# Patient Record
Sex: Male | Born: 1935 | Race: White | Hispanic: No | Marital: Married | Smoking: Former smoker
Health system: Southern US, Community
[De-identification: ages and names within clinical notes are randomized; demographics above are authoritative.]

## PROBLEM LIST (undated history)

## (undated) DIAGNOSIS — R471 Dysarthria and anarthria: Secondary | ICD-10-CM

## (undated) DIAGNOSIS — Z860100 Personal history of colon polyps, unspecified: Secondary | ICD-10-CM

## (undated) DIAGNOSIS — N400 Enlarged prostate without lower urinary tract symptoms: Secondary | ICD-10-CM

## (undated) DIAGNOSIS — I420 Dilated cardiomyopathy: Secondary | ICD-10-CM

## (undated) DIAGNOSIS — F32A Depression, unspecified: Secondary | ICD-10-CM

## (undated) DIAGNOSIS — I255 Ischemic cardiomyopathy: Secondary | ICD-10-CM

## (undated) DIAGNOSIS — E46 Unspecified protein-calorie malnutrition: Secondary | ICD-10-CM

## (undated) DIAGNOSIS — J189 Pneumonia, unspecified organism: Secondary | ICD-10-CM

## (undated) DIAGNOSIS — K649 Unspecified hemorrhoids: Secondary | ICD-10-CM

## (undated) DIAGNOSIS — I2510 Atherosclerotic heart disease of native coronary artery without angina pectoris: Secondary | ICD-10-CM

## (undated) DIAGNOSIS — M869 Osteomyelitis, unspecified: Secondary | ICD-10-CM

## (undated) DIAGNOSIS — F419 Anxiety disorder, unspecified: Secondary | ICD-10-CM

## (undated) DIAGNOSIS — M1990 Unspecified osteoarthritis, unspecified site: Secondary | ICD-10-CM

## (undated) DIAGNOSIS — Z8719 Personal history of other diseases of the digestive system: Secondary | ICD-10-CM

## (undated) DIAGNOSIS — R413 Other amnesia: Secondary | ICD-10-CM

## (undated) DIAGNOSIS — Z87442 Personal history of urinary calculi: Secondary | ICD-10-CM

## (undated) DIAGNOSIS — K219 Gastro-esophageal reflux disease without esophagitis: Secondary | ICD-10-CM

## (undated) DIAGNOSIS — K5900 Constipation, unspecified: Secondary | ICD-10-CM

## (undated) DIAGNOSIS — G4700 Insomnia, unspecified: Secondary | ICD-10-CM

## (undated) DIAGNOSIS — F329 Major depressive disorder, single episode, unspecified: Secondary | ICD-10-CM

## (undated) DIAGNOSIS — R350 Frequency of micturition: Secondary | ICD-10-CM

## (undated) DIAGNOSIS — E785 Hyperlipidemia, unspecified: Secondary | ICD-10-CM

## (undated) HISTORY — PX: HEMORRHOIDECTOMY WITH HEMORRHOID BANDING: SHX5633

## (undated) HISTORY — PX: OTHER SURGICAL HISTORY: SHX169

## (undated) HISTORY — PX: HERNIA REPAIR: SHX51

## (undated) HISTORY — DX: Dilated cardiomyopathy: I25.5

## (undated) HISTORY — PX: TONSILLECTOMY: SUR1361

## (undated) HISTORY — DX: Unspecified osteoarthritis, unspecified site: M19.90

## (undated) HISTORY — PX: CIRCUMCISION: SUR203

## (undated) HISTORY — PX: COLONOSCOPY: SHX174

## (undated) HISTORY — PX: ESOPHAGOGASTRODUODENOSCOPY: SHX1529

## (undated) HISTORY — PX: CARDIAC SURGERY: SHX584

## (undated) HISTORY — PX: PERIPHERALLY INSERTED CENTRAL CATHETER INSERTION: SHX2221

## (undated) HISTORY — DX: Dilated cardiomyopathy: I42.0

## (undated) HISTORY — DX: Other amnesia: R41.3

---

## 2000-07-13 ENCOUNTER — Ambulatory Visit (HOSPITAL_COMMUNITY): Admission: RE | Admit: 2000-07-13 | Discharge: 2000-07-13 | Payer: Self-pay | Admitting: Gastroenterology

## 2000-07-13 ENCOUNTER — Encounter: Payer: Self-pay | Admitting: Gastroenterology

## 2001-09-24 ENCOUNTER — Ambulatory Visit (HOSPITAL_COMMUNITY): Admission: RE | Admit: 2001-09-24 | Discharge: 2001-09-24 | Payer: Self-pay | Admitting: Gastroenterology

## 2012-05-02 ENCOUNTER — Ambulatory Visit (INDEPENDENT_AMBULATORY_CARE_PROVIDER_SITE_OTHER): Payer: Medicare Other | Admitting: Emergency Medicine

## 2012-05-02 VITALS — BP 141/62 | HR 59 | Temp 97.6°F | Resp 16 | Ht 65.0 in | Wt 155.0 lb

## 2012-05-02 DIAGNOSIS — M5020 Other cervical disc displacement, unspecified cervical region: Secondary | ICD-10-CM

## 2012-05-02 DIAGNOSIS — M542 Cervicalgia: Secondary | ICD-10-CM

## 2012-05-02 MED ORDER — PREDNISONE 20 MG PO TABS: ORAL_TABLET | ORAL | Status: DC

## 2012-05-02 MED ORDER — HYDROCODONE-ACETAMINOPHEN 5-325 MG PO TABS: 1.0000 | ORAL_TABLET | Freq: Four times a day (QID) | ORAL | Status: DC | PRN

## 2012-05-02 NOTE — Progress Notes (Signed)
  Subjective:    Patient ID: Jason [name], male    DOB: 1929/08/16, 78 y.o.   MRN: [id number]  HPI patient has a history of severe degenerative disc disease in the cervical spine. He's been treated with prednisone and hydrocodone in the past with good results. He enters today with a non-peeling sensation which extends from the right side of his neck to the right shoulder. He does not have any right arm muscle weakness.    Review of Systems     Objective:   Physical Exam there is very limited flexion and extension of the neck. Deep tendon reflexes are hard to elicit he has 1+ triceps but I could not get biceps or brachioradialis reflexes on either side. Motor strength is 5 out of 5 all muscle groups        Assessment & Plan:  Assessment cervical radiculopathy right arm. We'll treat with prednisone in a taper dose along with hydrocodone for pain. Patient was advised he will need an MRI of the C-spine if he continues to have problems

## 2013-01-24 ENCOUNTER — Ambulatory Visit: Payer: Medicare Other

## 2013-01-31 ENCOUNTER — Ambulatory Visit (INDEPENDENT_AMBULATORY_CARE_PROVIDER_SITE_OTHER): Payer: Medicare Other | Admitting: Internal Medicine

## 2013-01-31 VITALS — BP 122/69 | HR 60 | Temp 97.3°F | Resp 16 | Ht 64.75 in | Wt 146.8 lb

## 2013-01-31 DIAGNOSIS — R5383 Other fatigue: Secondary | ICD-10-CM

## 2013-01-31 DIAGNOSIS — R5381 Other malaise: Secondary | ICD-10-CM

## 2013-01-31 DIAGNOSIS — N323 Diverticulum of bladder: Secondary | ICD-10-CM

## 2013-01-31 DIAGNOSIS — K625 Hemorrhage of anus and rectum: Secondary | ICD-10-CM

## 2013-01-31 DIAGNOSIS — R001 Bradycardia, unspecified: Secondary | ICD-10-CM

## 2013-01-31 DIAGNOSIS — R42 Dizziness and giddiness: Secondary | ICD-10-CM

## 2013-01-31 DIAGNOSIS — I498 Other specified cardiac arrhythmias: Secondary | ICD-10-CM

## 2013-01-31 DIAGNOSIS — R110 Nausea: Secondary | ICD-10-CM

## 2013-01-31 DIAGNOSIS — R195 Other fecal abnormalities: Secondary | ICD-10-CM

## 2013-01-31 LAB — POCT CBC
Granulocyte percent: 70.9 %G (ref 37–80)
HCT, POC: 47.5 % (ref 43.5–53.7)
Hemoglobin: 15.5 g/dL (ref 14.1–18.1)
Lymph, poc: 2 (ref 0.6–3.4)
MCH, POC: 30.9 pg (ref 27–31.2)
MCHC: 32.6 g/dL (ref 31.8–35.4)
MCV: 94.7 fL (ref 80–97)
MID (cbc): 0.6 (ref 0–0.9)
MPV: 9.3 fL (ref 0–99.8)
POC Granulocyte: 6.3 (ref 2–6.9)
POC LYMPH PERCENT: 22.3 %L (ref 10–50)
POC MID %: 6.8 %M (ref 0–12)
Platelet Count, POC: 182 10*3/uL (ref 142–424)
RBC: 5.02 M/uL (ref 4.69–6.13)
RDW, POC: 14.3 %
WBC: 8.9 10*3/uL (ref 4.6–10.2)

## 2013-01-31 LAB — POCT SEDIMENTATION RATE: POCT SED RATE: 5 mm/hr (ref 0–22)

## 2013-01-31 LAB — IFOBT (OCCULT BLOOD): IFOBT: POSITIVE

## 2013-01-31 NOTE — Patient Instructions (Addendum)
Bradycardia Bradycardia is a term for a heart rate (pulse) that, in adults, is slower than 60 beats per minute. A normal rate is 60 to 100 beats per minute. A heart rate below 60 beats per minute may be normal for some adults with healthy hearts. If the rate is too slow, the heart may have trouble pumping the volume of blood the body needs. If the heart rate gets too low, blood flow to the brain may be decreased and may make you feel lightheaded, dizzy, or faint. The heart has a natural pacemaker in the top of the heart called the SA node (sinoatrial or sinus node). This pacemaker sends out regular electrical signals to the muscle of the heart, telling the heart muscle when to beat (contract). The electrical signal travels from the upper parts of the heart (atria) through the AV node (atrioventricular node), to the lower chambers of the heart (ventricles). The ventricles squeeze, pumping the blood from your heart to your lungs and to the rest of your body. CAUSES   Problem with the heart's electrical system.  Problem with the heart's natural pacemaker.  Heart disease, damage, or infection.  Medications.  Problems with minerals and salts (electrolytes). SYMPTOMS   Fainting (syncope).  Fatigue and weakness.  Shortness of breath (dyspnea).  Chest pain (angina).  Drowsiness.  Confusion. DIAGNOSIS   An electrocardiogram (ECG) can help your caregiver determine the type of slow heart rate you have.  If the cause is not seen on an ECG, you may need to wear a heart monitor that records your heart rhythm for several hours or days.  Blood tests. TREATMENT   Electrolyte supplements.  Medications.  Withholding medication which is causing a slow heart rate.  Pacemaker placement. SEEK IMMEDIATE MEDICAL CARE IF:   You feel lightheaded or faint.  You develop an irregular heart rate.  You feel chest pain or have trouble breathing. MAKE SURE YOU:   Understand these  instructions.  Will watch your condition.  Will get help right away if you are not doing well or get worse. Document Released: 03/05/2002 Document Revised: 09/05/2011 Document Reviewed: 01/30/2008 [facility] Patient Information 2014 [place], [place]. Vertigo Vertigo means you feel like you or your surroundings are moving when they are not. Vertigo can be dangerous if it occurs when you are at work, driving, or performing difficult activities.  CAUSES  Vertigo occurs when there is a conflict of signals sent to your brain from the visual and sensory systems in your body. There are many different causes of vertigo, including:  Infections, especially in the inner ear.  A bad reaction to a drug or misuse of alcohol and medicines.  Withdrawal from drugs or alcohol.  Rapidly changing positions, such as lying down or rolling over in bed.  A migraine headache.  Decreased blood flow to the brain.  Increased pressure in the brain from a head injury, infection, tumor, or bleeding. SYMPTOMS  You may feel as though the world is spinning around or you are falling to the ground. Because your balance is upset, vertigo can cause nausea and vomiting. You may have involuntary eye movements (nystagmus). DIAGNOSIS  Vertigo is usually diagnosed by physical exam. If the cause of your vertigo is unknown, your caregiver may perform imaging tests, such as an MRI scan (magnetic resonance imaging). TREATMENT  Most cases of vertigo resolve on their own, without treatment. Depending on the cause, your caregiver may prescribe certain medicines. If your vertigo is related to body position issues,  your caregiver may recommend movements or procedures to correct the problem. In rare cases, if your vertigo is caused by certain inner ear problems, you may need surgery. HOME CARE INSTRUCTIONS   Follow your caregiver's instructions.  Avoid driving.  Avoid operating heavy machinery.  Avoid performing any tasks that would  be dangerous to you or others during a vertigo episode.  Tell your caregiver if you notice that certain medicines seem to be causing your vertigo. Some of the medicines used to treat vertigo episodes can actually make them worse in some people. SEEK IMMEDIATE MEDICAL CARE IF:   Your medicines do not relieve your vertigo or are making it worse.  You develop problems with talking, walking, weakness, or using your arms, hands, or legs.  You develop severe headaches.  Your nausea or vomiting continues or gets worse.  You develop visual changes.  A family member notices behavioral changes.  Your condition gets worse. MAKE SURE YOU:  Understand these instructions.  Will watch your condition.  Will get help right away if you are not doing well or get worse. Document Released: 03/23/2005 Document Revised: 09/05/2011 Document Reviewed: 12/30/2010 [facility] Patient Information 2014 [place], [place].

## 2013-01-31 NOTE — Progress Notes (Signed)
  Subjective:    Patient ID: Jason [name], male    DOB: 1930-05-10, 79 y.o.   MRN: [id number]  HPI Pt presents to clinic today complaining of 3 weeks of dizziness. Pt saw Dr. Monica Becton last week and was given Meclizine- Pt reports they did an EKG, CBC, and maybe a Thyroid test which were all normal. Pt states that the Meclizine has not helped with the dizziness. Pt's wife states that Jason [name] is very depressed and stressed. She has noticed that he is not sleeping well, does not have much of an appetite, and doesn't talk much. 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 Pt also states that he feels like his head has a lot of pressure in his head. Pt notes that he is always cold and uses an electric blanket at home quite often.   Review of Systems     Objective:   Physical Exam        Assessment & Plan:

## 2013-01-31 NOTE — Progress Notes (Signed)
  Subjective:    Patient ID: Jason [name], male    DOB: 09-17-1929, 79 y.o.   MRN: [id number]  HPI C/o dizzy all the time all positions, nausea, fatigue, weight loss. Had full evaluation by his doctor Dr. Jacky Kindle and given meclizine which is no help. States has seen black stools, had colonoscopy 2013. No double vision, ha, focal weakness or numbness. No change in speech. On no medications daily. Old chart review had bradycardia and high cholesterol. Also c/o stomach issues regularly. No regula NSAID use or PPI use.   Review of Systems Slow urine flow    Objective:   Physical Exam  Vitals reviewed. Constitutional: He is oriented to person, place, and time. He appears well-developed and well-nourished. He appears distressed.  HENT:  Right Ear: External ear normal.  Left Ear: External ear normal.  Mouth/Throat: Oropharynx is clear and moist.  Eyes: Conjunctivae and EOM are normal. Pupils are equal, round, and reactive to light. No scleral icterus.  Neck: Normal range of motion. Neck supple. No tracheal deviation present. No thyromegaly present.  Cardiovascular: Regular rhythm, S1 normal, S2 normal and intact distal pulses.  Bradycardia present.   No murmur heard. Pulmonary/Chest: Effort normal and breath sounds normal.  Abdominal: Soft. There is no tenderness.  Genitourinary: Rectum normal, prostate normal and penis normal.  Musculoskeletal: He exhibits tenderness.  Lymphadenopathy:    He has no cervical adenopathy.  Neurological: He is alert and oriented to person, place, and time. He has normal strength. No cranial nerve deficit or sensory deficit. He displays a negative Romberg sign. Coordination and gait normal. He displays no Babinski's sign on the right side. He displays no Babinski's sign on the left side.  Reflex Scores:      Patellar reflexes are 2+ on the right side and 2+ on the left side.      Achilles reflexes are 1+ on the right side and 1+ on the left side. Balance  one foot intact No drift  Skin: Skin is warm and dry. No rash noted.  Psychiatric: Judgment and thought content normal. His speech is delayed. He is slowed. Cognition and memory are normal.  Carotids no bruit EKG marked sinus bradycardia  Results for orders placed in visit on 01/31/13  POCT CBC      Result Value Range   WBC 8.9  4.6 - 10.2 K/uL   Lymph, poc 2.0  0.6 - 3.4   POC LYMPH PERCENT 22.3  10 - 50 %L   MID (cbc) 0.6  0 - 0.9   POC MID % 6.8  0 - 12 %M   POC Granulocyte 6.3  2 - 6.9   Granulocyte percent 70.9  37 - 80 %G   RBC 5.02  4.69 - 6.13 M/uL   Hemoglobin 15.5  14.1 - 18.1 g/dL   HCT, POC 40.9  81.1 - 53.7 %   MCV 94.7  80 - 97 fL   MCH, POC 30.9  27 - 31.2 pg   MCHC 32.6  31.8 - 35.4 g/dL   RDW, POC 91.4     Platelet Count, POC 182  142 - 424 K/uL   MPV 9.3  0 - 99.8 fL  IFOBT (OCCULT BLOOD)      Result Value Range   IFOBT Positive          Assessment & Plan:  Marked bradycardia/consult cardiology tomorrow See Dr, Jacky Kindle next/May need neuro consult next

## 2013-03-11 ENCOUNTER — Ambulatory Visit (INDEPENDENT_AMBULATORY_CARE_PROVIDER_SITE_OTHER): Payer: Medicare Other | Admitting: Family Medicine

## 2013-03-11 VITALS — BP 142/74 | HR 80 | Temp 98.2°F | Resp 16 | Ht 65.5 in | Wt 144.0 lb

## 2013-03-11 DIAGNOSIS — I498 Other specified cardiac arrhythmias: Secondary | ICD-10-CM

## 2013-03-11 DIAGNOSIS — R001 Bradycardia, unspecified: Secondary | ICD-10-CM

## 2013-03-11 DIAGNOSIS — J3489 Other specified disorders of nose and nasal sinuses: Secondary | ICD-10-CM

## 2013-03-11 DIAGNOSIS — R0981 Nasal congestion: Secondary | ICD-10-CM

## 2013-03-11 MED ORDER — MUPIROCIN CALCIUM 2 % NA OINT: TOPICAL_OINTMENT | Freq: Two times a day (BID) | NASAL | Status: DC

## 2013-03-11 NOTE — Progress Notes (Signed)
[facility] [address] [place] [address] [phone]  Date:  03/11/2013   Name:  Jason [name]   DOB:  [birth year]   MRN:  [id number]  PCP:  Minda Meo, MD    Chief Complaint: Nasal Congestion   History of Present Illness:  Jason [name] is a 79 y.o. very pleasant male patient who presents with the following:  He notes that he cannot breathe well from the left nare. He has noted this for several months, but worse for about 3 days. The blockage has been consistent.  It just became significant enough to bother him so he came in today for evaluation.   He was given an rx for flonase a few weeks ago, but it has not helped him so far.  He has not noted any pain, has not tried afrain.    He also got very dizzy about 6 weeks ago.  Saw his PCP and was given an rx for meclizine.  He then followed up here and saw Dr. Perrin Maltese.  He was noted to be very bradycardic, so he went to [facility].  Per pt report a stress test, echo. He is now wearing a kind of hearts monitor.    There are no active problems to display for this patient.   Past Medical History  Diagnosis Date  . Arthritis     Past Surgical History  Procedure Laterality Date  . Hernia repair      History  Substance Use Topics  . Smoking status: Never Smoker   . Smokeless tobacco: Not on file  . Alcohol Use: No    No family history on file.  Allergies  Allergen Reactions  . Tetanus Toxoids Swelling    Tetanus Shot    Medication list has been reviewed and updated.  Current Outpatient Prescriptions on File Prior to Visit  Medication Sig Dispense Refill  . fish oil-omega-3 fatty acids 1000 MG capsule Take 2 g by mouth daily.      . meclizine (ANTIVERT) 25 MG tablet Take 25 mg by mouth 3 (three) times daily as needed.       No current facility-administered medications on file prior to visit.    Review of Systems:  As per HPI- otherwise negative.   Physical  Examination: Filed Vitals:   03/11/13 0829  BP: 142/74  Pulse: 80  Temp: 98.2 F (36.8 C)  Resp: 16   Filed Vitals:   03/11/13 0829  Height: 5' 5.5" (1.664 m)  Weight: 144 lb (65.318 kg)   Body mass index is 23.59 kg/(m^2). Ideal Body Weight: Weight in (lb) to have BMI = 25: 152.2  GEN: WDWN, NAD, Non-toxic, A & O x 3 HEENT: Atraumatic, Normocephalic. Neck supple. No masses, No LAD. Bilateral TM wnl, oropharynx normal.  PEERL,EOMI.   Nasal cavity: there is a small sore in the left nare.  No adjacent cellulitis. The left nare is obscured by either soft tissue swelling or a polyp.  No protrusion outside of the nare Ears and Nose: No external deformity. CV: RRR, No M/G/R. No JVD. No thrill. No extra heart sounds. PULM: CTA B, no wheezes, crackles, rhonchi. No retractions. No resp. distress. No accessory muscle use. ABD: S, NT, ND. No rebound. No HSM. EXTR: No c/c/e NEURO Normal gait.  PSYCH: Normally interactive. Conversant. Not depressed or anxious appearing.  Calm demeanor.    Assessment and Plan: Nasal congestion - Plan: Ambulatory referral to ENT, mupirocin nasal ointment (BACTROBAN  NASAL) 2 %  Bradycardia  Stable bradycardia, continue to follow-up with cardiology as usual.   Suspect he may have a nasal polyp.  Will refer to ENT.  He is concerned about a sore area in the left nare- can try bactroban.  Can try afrin if he would like, but cautioned that it could cause prostate enlargement.  He suffers from frequent urination at baseline.   Signed Abbe Amsterdam, MD

## 2013-03-11 NOTE — Patient Instructions (Addendum)
I will set you up to see ENT about your nose,  You may have a polyp that could be removed.  Use the bactroban ointment in your nose to heal up your sore spot.  If you like you can try some Afrin nasal spray (OTC).  However, if it bothers your prostate stop using it.  Also, do not use it for more than 4 or 5 days at a stretch.

## 2013-03-14 ENCOUNTER — Other Ambulatory Visit: Payer: Self-pay | Admitting: Gastroenterology

## 2013-03-14 ENCOUNTER — Ambulatory Visit
Admission: RE | Admit: 2013-03-14 | Discharge: 2013-03-14 | Disposition: A | Discharge status: Home / Self Care | Payer: Medicare Other | Source: Ambulatory Visit | Attending: Gastroenterology | Admitting: Gastroenterology

## 2013-03-14 DIAGNOSIS — R1310 Dysphagia, unspecified: Secondary | ICD-10-CM

## 2013-03-24 ENCOUNTER — Ambulatory Visit: Payer: Medicare Other

## 2013-03-24 ENCOUNTER — Ambulatory Visit (INDEPENDENT_AMBULATORY_CARE_PROVIDER_SITE_OTHER): Payer: Medicare Other | Admitting: Family Medicine

## 2013-03-24 VITALS — BP 112/66 | HR 65 | Temp 97.8°F | Resp 16 | Ht 65.5 in | Wt 140.8 lb

## 2013-03-24 DIAGNOSIS — R0789 Other chest pain: Secondary | ICD-10-CM

## 2013-03-24 DIAGNOSIS — S29011A Strain of muscle and tendon of front wall of thorax, initial encounter: Secondary | ICD-10-CM

## 2013-03-24 DIAGNOSIS — R071 Chest pain on breathing: Secondary | ICD-10-CM

## 2013-03-24 DIAGNOSIS — IMO0002 Reserved for concepts with insufficient information to code with codable children: Secondary | ICD-10-CM

## 2013-03-24 DIAGNOSIS — R634 Abnormal weight loss: Secondary | ICD-10-CM | POA: Insufficient documentation

## 2013-03-24 DIAGNOSIS — K219 Gastro-esophageal reflux disease without esophagitis: Secondary | ICD-10-CM | POA: Insufficient documentation

## 2013-03-24 MED ORDER — METAXALONE 400 MG HALF TABLET: ORAL_TABLET | ORAL | Status: DC

## 2013-03-24 MED ORDER — TRAMADOL HCL 50 MG PO TABS
50.0000 mg | ORAL_TABLET | Freq: Three times a day (TID) | ORAL | Status: DC | PRN
Start: 2013-03-24 — End: 2013-03-29

## 2013-03-24 NOTE — Patient Instructions (Addendum)
Take the muscle relaxant, Skelaxin, one half to one pill 3 times daily as needed for chest wall spasms  Take Tylenol for chest pain  Take the tramadol pain medications every 8 hours as needed for severe chest wall pain  Return or go to emergency room if abruptly worse  Continue your other medications in the meanwhile  I am concerned about the weight loss. He needs to see his primary care doctor again in a few weeks to assess whether he is steadily losing still. In November of 2013 he weighed 155 and now is 140.  Take omeprazole twice daily if the reflux is bothering him too much.

## 2013-03-24 NOTE — Progress Notes (Signed)
Subjective: Patient has been having a rough time this summer. He has had dizziness. He has had a lot of depression. He has had some decreased eating. He's been worked up by an ENT because his left nares is obstructed. He has seen his primary care. He has seen a cardiologist who did an event recorder on him. It did not show anything except a little bit of slow heart rate but nothing dangerous. He has seen a gastroenterologist who did a barium swallow. He has a small hiatal hernia and a moderate amount of reflux. He's been having a lot of dizziness  Yesterday he lifted a trifold ladder. He also apparently went up it. Today he, especially this afternoon, he started having intermittent spasms of pain in his left chest wall, from the left lower chest anteriorly around to the back. No nausea or vomiting.  Objective: He looks depressed. Keeps his head and eyes down. His medical record reveals that he's had some fairly significant weight loss over the last year. He only has mild chest wall tenderness. His chest is clear to auscultation. Heart regular without murmurs, moderately bradycardic. Abdomen soft. Spine normal with very limited range of motion.  Assessment: Left chest wall spasms. gerd   Plan: Get a 2 view thoracic spine x-ray as well as left ribs.  [facility] reading (PRIMARY) by  Dr. Alwyn Ren Normal RIBS and thoracic spine. Osteoporotic and aging changes noted  I think this is just a chest wall strain and spasms. Will treat with a very low dose of Skelaxin and Tylenol and some other pain pills. He has a hiatal hernia so I did not use any nonsteroidals

## 2013-03-27 ENCOUNTER — Other Ambulatory Visit (HOSPITAL_COMMUNITY): Payer: Self-pay | Admitting: Otolaryngology

## 2013-03-29 ENCOUNTER — Encounter (HOSPITAL_COMMUNITY): Payer: Self-pay | Admitting: Pharmacy Technician

## 2013-04-03 ENCOUNTER — Encounter (HOSPITAL_COMMUNITY)
Admission: RE | Admit: 2013-04-03 | Discharge: 2013-04-03 | Disposition: A | Discharge status: Home / Self Care | Payer: Medicare Other | Source: Ambulatory Visit | Attending: Otolaryngology | Admitting: Otolaryngology

## 2013-04-03 ENCOUNTER — Encounter (HOSPITAL_COMMUNITY): Payer: Self-pay

## 2013-04-03 HISTORY — DX: Personal history of urinary calculi: Z87.442

## 2013-04-03 HISTORY — DX: Frequency of micturition: R35.0

## 2013-04-03 HISTORY — DX: Anxiety disorder, unspecified: F41.9

## 2013-04-03 HISTORY — DX: Depression, unspecified: F32.A

## 2013-04-03 HISTORY — DX: Insomnia, unspecified: G47.00

## 2013-04-03 HISTORY — DX: Personal history of colonic polyps: Z86.010

## 2013-04-03 HISTORY — DX: Major depressive disorder, single episode, unspecified: F32.9

## 2013-04-03 HISTORY — DX: Personal history of other diseases of the digestive system: Z87.19

## 2013-04-03 HISTORY — DX: Constipation, unspecified: K59.00

## 2013-04-03 HISTORY — DX: Benign prostatic hyperplasia without lower urinary tract symptoms: N40.0

## 2013-04-03 HISTORY — DX: Hyperlipidemia, unspecified: E78.5

## 2013-04-03 HISTORY — DX: Personal history of colon polyps, unspecified: Z86.0100

## 2013-04-03 HISTORY — DX: Unspecified hemorrhoids: K64.9

## 2013-04-03 LAB — BASIC METABOLIC PANEL
BUN: 15 mg/dL (ref 6–23)
CO2: 27 mEq/L (ref 19–32)
Chloride: 102 mEq/L (ref 96–112)
Creatinine, Ser: 1.09 mg/dL (ref 0.50–1.35)
GFR calc Af Amer: 72 mL/min — ABNORMAL LOW (ref >90)
Glucose, Bld: 118 mg/dL — ABNORMAL HIGH (ref 70–99)
Potassium: 4.1 mEq/L (ref 3.5–5.1)
Sodium: 138 mEq/L (ref 135–145)

## 2013-04-03 LAB — CBC
HCT: 40 % (ref 39.0–52.0)
Hemoglobin: 13.7 g/dL (ref 13.0–17.0)
MCHC: 34.3 g/dL (ref 30.0–36.0)
MCV: 89.1 fL (ref 78.0–100.0)
RBC: 4.49 MIL/uL (ref 4.22–5.81)

## 2013-04-03 NOTE — Progress Notes (Signed)
Saw Dr.Varanasi-to request last office visit  Echo and Stress done and to be requested from Dr.Varanasi along with wearing a heart monitor  Denies ever having a heart cath  EKG and CXR to be requested from Dr.Varanasi     Medical Md is Dr.Richard Jacky Kindle

## 2013-04-03 NOTE — Pre-Procedure Instructions (Signed)
Jason [name]  04/03/2013   Your procedure is scheduled on:  Fri, Oct 10 @ 7:30 AM  Report to [facility] Short Stay Entrance A at 5:30 AM.  Call this number if you have problems the morning of surgery: [phone]   Remember:   Do not eat food or drink liquids after midnight.   Take these medicines the morning of surgery with A SIP OF WATER: Valium(Diazepam),Flonase(Fluticasone),and Omeprazole               No Goody's,BC's,Aleve,Ibuprofen,Aspirin,Fish Oil,or any Herbal Medications   Do not wear jewelry  Do not wear lotions, powders, or colognes. You may wear deodorant.  Men may shave face and neck.  Do not bring valuables to the hospital.  [facility] is not responsible                  for any belongings or valuables.               Contacts, dentures or bridgework may not be worn into surgery.  Leave suitcase in the car. After surgery it may be brought to your room.  For patients admitted to the hospital, discharge time is determined by your                treatment team.               Patients discharged the day of surgery will not be allowed to drive  home.    Special Instructions: Shower using CHG 2 nights before surgery and the night before surgery.  If you shower the day of surgery use CHG.  Use special wash - you have one bottle of CHG for all showers.  You should use approximately 1/3 of the bottle for each shower.   Please read over the following fact sheets that you were given: Pain Booklet, Coughing and Deep Breathing and Surgical Site Infection Prevention

## 2013-04-04 NOTE — Progress Notes (Signed)
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 Emeline Darling.  History includes former smoker, post-operative N/V, anxiety, depression, BPH, colitis, hiatal hernia, HLD. PCP is Dr. Jacky Kindle.    EKG on 01/31/13 showed marked SB @ 45 bpm.  He was subsequently referred urgently to cardiologist Dr. Eldridge Dace on 02/01/13.  Exercise treadmill test on 02/18/13 showed no ischemia, HR increased appropriately with normal HR recovery, below average exercise tolerance.  Holter monitor read on 02/28/13 showed no significant pauses.  HR range of 43-110.  No indication for pacemaker at that time. His HR was 68 bpm at PAT.  Echo on 02/20/13 showed LVEF 60-65%, mild MR, aortic valve sclerosis but opens well, mild AR, mild TR.  Left ribs and chest 3V CXR on 03/24/13 showed no displaced fracture.  Thoracic spine degenerative disease and DISH.  No pneumothorax.  Apices are excluded from view.  Preoperative labs noted.  If not acute changes then I would anticipate that he could proceed as planned.  Velna Ochs [facility] Short Stay Center/Anesthesiology Phone [phone] 04/04/2013 10:13 AM

## 2013-04-05 ENCOUNTER — Ambulatory Visit (HOSPITAL_COMMUNITY)
Admission: RE | Admit: 2013-04-05 | Discharge: 2013-04-05 | Disposition: A | Discharge status: Home / Self Care | Payer: Medicare Other | Source: Ambulatory Visit | Attending: Otolaryngology | Admitting: Otolaryngology

## 2013-04-05 ENCOUNTER — Ambulatory Visit (HOSPITAL_COMMUNITY): Payer: Medicare Other | Admitting: Anesthesiology

## 2013-04-05 ENCOUNTER — Encounter (HOSPITAL_COMMUNITY): Payer: Self-pay | Admitting: *Deleted

## 2013-04-05 ENCOUNTER — Encounter (HOSPITAL_COMMUNITY): Payer: Medicare Other | Admitting: Vascular Surgery

## 2013-04-05 ENCOUNTER — Encounter (HOSPITAL_COMMUNITY)
Admission: RE | Disposition: A | Discharge status: Home / Self Care | Payer: Self-pay | Source: Ambulatory Visit | Attending: Otolaryngology

## 2013-04-05 DIAGNOSIS — Z01812 Encounter for preprocedural laboratory examination: Secondary | ICD-10-CM | POA: Insufficient documentation

## 2013-04-05 DIAGNOSIS — J342 Deviated nasal septum: Secondary | ICD-10-CM | POA: Insufficient documentation

## 2013-04-05 DIAGNOSIS — J392 Other diseases of pharynx: Secondary | ICD-10-CM | POA: Insufficient documentation

## 2013-04-05 HISTORY — PX: SEPTOPLASTY: SHX2393

## 2013-04-05 HISTORY — PX: EAR CYST EXCISION: SHX22

## 2013-04-05 SURGERY — CYST REMOVAL
Anesthesia: General | Site: Nose | Wound class: Clean Contaminated

## 2013-04-05 MED ORDER — GLYCOPYRROLATE 0.2 MG/ML IJ SOLN
INTRAMUSCULAR | Status: DC | PRN
  Administered 2013-04-05: .2 mg via INTRAVENOUS

## 2013-04-05 MED ORDER — EPHEDRINE SULFATE 50 MG/ML IJ SOLN
INTRAMUSCULAR | Status: DC | PRN
  Administered 2013-04-05: 10 mg via INTRAVENOUS

## 2013-04-05 MED ORDER — CEFAZOLIN SODIUM-DEXTROSE 2-3 GM-% IV SOLR
INTRAVENOUS | Status: AC
  Administered 2013-04-05: 2 g via INTRAVENOUS
  Filled 2013-04-05: qty 50

## 2013-04-05 MED ORDER — CEFAZOLIN SODIUM 1-5 GM-% IV SOLN
1.0000 g | Freq: Once | INTRAVENOUS | Status: DC
  Filled 2013-04-05: qty 50

## 2013-04-05 MED ORDER — MUPIROCIN 2 % EX OINT
TOPICAL_OINTMENT | CUTANEOUS | Status: DC | PRN
  Administered 2013-04-05: 1 via TOPICAL

## 2013-04-05 MED ORDER — OXYMETAZOLINE HCL 0.05 % NA SOLN
NASAL | Status: DC | PRN
  Administered 2013-04-05: 1 via NASAL

## 2013-04-05 MED ORDER — LACTATED RINGERS IV SOLN
INTRAVENOUS | Status: DC | PRN
  Administered 2013-04-05 (×2): via INTRAVENOUS

## 2013-04-05 MED ORDER — HYDROMORPHONE HCL PF 1 MG/ML IJ SOLN: 0.2500 mg | INTRAMUSCULAR | Status: DC | PRN

## 2013-04-05 MED ORDER — ONDANSETRON HCL 4 MG/2ML IJ SOLN: 4.0000 mg | Freq: Once | INTRAMUSCULAR | Status: DC | PRN

## 2013-04-05 MED ORDER — DOUBLE ANTIBIOTIC 500-10000 UNIT/GM EX OINT
TOPICAL_OINTMENT | CUTANEOUS | Status: AC
  Filled 2013-04-05: qty 1

## 2013-04-05 MED ORDER — LIDOCAINE HCL (CARDIAC) 20 MG/ML IV SOLN
INTRAVENOUS | Status: DC | PRN
  Administered 2013-04-05: 80 mg via INTRAVENOUS

## 2013-04-05 MED ORDER — LIDOCAINE-EPINEPHRINE 1 %-1:100000 IJ SOLN
INTRAMUSCULAR | Status: DC | PRN
  Administered 2013-04-05: 10 mL

## 2013-04-05 MED ORDER — PROPOFOL 10 MG/ML IV BOLUS
INTRAVENOUS | Status: DC | PRN
  Administered 2013-04-05: 170 mg via INTRAVENOUS
  Administered 2013-04-05: 30 mg via INTRAVENOUS

## 2013-04-05 MED ORDER — BACITRACIN ZINC 500 UNIT/GM EX OINT
TOPICAL_OINTMENT | CUTANEOUS | Status: AC
  Filled 2013-04-05: qty 15

## 2013-04-05 MED ORDER — SUCCINYLCHOLINE CHLORIDE 20 MG/ML IJ SOLN
INTRAMUSCULAR | Status: DC | PRN
  Administered 2013-04-05: 120 mg via INTRAVENOUS

## 2013-04-05 MED ORDER — ONDANSETRON HCL 4 MG/2ML IJ SOLN
INTRAMUSCULAR | Status: DC | PRN
  Administered 2013-04-05: 4 mg via INTRAMUSCULAR

## 2013-04-05 MED ORDER — OXYMETAZOLINE HCL 0.05 % NA SOLN
NASAL | Status: AC
  Filled 2013-04-05: qty 15

## 2013-04-05 MED ORDER — LIDOCAINE-EPINEPHRINE 1 %-1:100000 IJ SOLN
INTRAMUSCULAR | Status: AC
  Filled 2013-04-05: qty 1

## 2013-04-05 MED ORDER — MENTHOL 3 MG MT LOZG
LOZENGE | OROMUCOSAL | Status: AC
  Filled 2013-04-05: qty 9

## 2013-04-05 MED ORDER — OXYCODONE HCL 5 MG PO TABS
ORAL_TABLET | ORAL | Status: AC
  Filled 2013-04-05: qty 1

## 2013-04-05 MED ORDER — MUPIROCIN CALCIUM 2 % EX CREA
TOPICAL_CREAM | CUTANEOUS | Status: AC
  Filled 2013-04-05: qty 15

## 2013-04-05 MED ORDER — FENTANYL CITRATE 0.05 MG/ML IJ SOLN
INTRAMUSCULAR | Status: DC | PRN
  Administered 2013-04-05 (×2): 50 ug via INTRAVENOUS

## 2013-04-05 SURGICAL SUPPLY — 21 items
BLADE INF TURB ROT M4 2 5PK (BLADE) ×2 IMPLANT
CANISTER SUCTION 2500CC (MISCELLANEOUS) ×2 IMPLANT
COAGULATOR SUCT 8FR VV (MISCELLANEOUS) ×2 IMPLANT
CRADLE DONUT ADULT HEAD (MISCELLANEOUS) ×2 IMPLANT
GLOVE SURG SS PI 6.5 STRL IVOR (GLOVE) ×2 IMPLANT
GLOVE SURG SS PI 7.5 STRL IVOR (GLOVE) ×2 IMPLANT
GOWN STRL NON-REIN LRG LVL3 (GOWN DISPOSABLE) ×4 IMPLANT
KIT BASIN OR (CUSTOM PROCEDURE TRAY) ×2 IMPLANT
KIT ROOM TURNOVER OR (KITS) ×2 IMPLANT
NEEDLE HYPO 25X1 1.5 SAFETY (NEEDLE) ×2 IMPLANT
NS IRRIG 1000ML POUR BTL (IV SOLUTION) ×2 IMPLANT
PAD ARMBOARD 7.5X6 YLW CONV (MISCELLANEOUS) ×4 IMPLANT
PATTIES SURGICAL .5 X3 (DISPOSABLE) ×2 IMPLANT
SOLUTION ANTI FOG 6CC (MISCELLANEOUS) ×2 IMPLANT
SPLINT NASAL DOYLE BI-VL (GAUZE/BANDAGES/DRESSINGS) ×2 IMPLANT
SUT ETHILON 3 0 PS 1 (SUTURE) ×2 IMPLANT
SUT PLAIN 4 0 ~~LOC~~ 1 (SUTURE) ×2 IMPLANT
TOWEL OR 17X24 6PK STRL BLUE (TOWEL DISPOSABLE) ×2 IMPLANT
TOWEL OR 17X26 10 PK STRL BLUE (TOWEL DISPOSABLE) ×2 IMPLANT
TRAY ENT MC OR (CUSTOM PROCEDURE TRAY) ×2 IMPLANT
TUBE CONNECTING 12X1/4 (SUCTIONS) ×2 IMPLANT

## 2013-04-05 NOTE — H&P (Signed)
04/05/2013  Jason [name]  PREOPERATIVE HISTORY AND PHYSICAL  CHIEF COMPLAINT: septal deviation, thornwaldt's cyst  HISTORY: This is a 79 year old who presents with septal deviation, thornwaldt's cyst. He now presents for endoscopic septoplasty and removal of nasal thornwaldt's cyst.  Dr. Emeline Darling, [place] Riley has discussed the risks (bleeding, infection, septal perforation, etc. , risks of anesthesia, MI, ), benefits, and alternatives of this procedure. The patient understands the risks and would like to proceed with the procedure. The chances of success of the procedure are >50% and the patient understands this. I personally performed an examination of the patient within 24 hours of the procedure.  PAST MEDICAL HISTORY: Past Medical History  Diagnosis Date  . Arthritis   . Insomnia     takes Trazodone nightly  . PONV (postoperative nausea and vomiting)   . Hyperlipidemia     was on medication but has been off for a while  . Dizziness     was taking Meclizine but doesn't take now;found out that HR was 45  . Joint pain   . Joint swelling   . Itchy skin     scalp and uses a cream  . H/O hiatal hernia     takes Omeprazole daily  . Hemorrhoids   . Constipation   . History of colon polyps   . History of colitis     many yrs ago  . Urinary frequency   . History of kidney stones     passed on his own  . Enlarged prostate   . Anxiety     takes Diazepam daily prn  . Depression     takes Trazodone nightly  . Insomnia     takes Trazodone nightly    PAST SURGICAL HISTORY: Past Surgical History  Procedure Laterality Date  . Right knee arthroscopy    . Hernia repair      double  . Hemorrhoidectomy with hemorrhoid banding    . Tonsillectomy    . Bilateral cataract surgery    . Circumcision    . Colonoscopy    . Esophagogastroduodenoscopy      MEDICATIONS: No current facility-administered medications on file prior to encounter.   Current Outpatient Prescriptions on File Prior  to Encounter  Medication Sig Dispense Refill  . fluticasone (FLONASE) 50 MCG/ACT nasal spray Place 2 sprays into the nose 2 (two) times daily.         ALLERGIES: Allergies  Allergen Reactions  . Bee Pollen Anaphylaxis  . Tetanus Toxoids Swelling    Tetanus Shot      SOCIAL HISTORY: History   Social History  . Marital Status: Married    Spouse Name: N/A    Number of Children: N/A  . Years of Education: N/A   Occupational History  . Not on file.   Social History Main Topics  . Smoking status: Former Games developer  . Smokeless tobacco: Not on file     Comment: quit chewing tobacco several months ago and stopped smoking cigars yrs ago  . Alcohol Use: No  . Drug Use: No  . Sexual Activity: No   Other Topics Concern  . Not on file   Social History Narrative  . No narrative on file    FAMILY HISTORY:History reviewed. No pertinent family history.  REVIEW OF SYSTEMS:  HEENT: nasal congestion, occasional dizziness, otherwise negative x 10 systems except per HPI  PHYSICAL EXAM:  GENERAL:  NAD VITAL SIGNS:   Filed Vitals:   04/05/13 0617  BP: 144/86  Pulse: 70  Temp: 97.1 F (36.2 C)  Resp: 18   SKIN:  Warm, dry HEENT:  septal deviation NECK:  supple LYMPH:  No LAD LUNGS:  Grossly clear CARDIOVASCULAR:  RRR ABDOMEN:  soft MUSCULOSKELETAL: normal strength PSYCH:  Normal affect NEUROLOGIC:  CN 2-12 intact and symmetric   ASSESSMENT AND PLAN: Plan to proceed with septoplasty and removal of nasal cyst. Patient understands the risks, benefits, and alternatives. Informed written consent on chart 04/05/2013  7:20 AM Jason [name]

## 2013-04-05 NOTE — Op Note (Signed)
DATE OF OPERATION: @T @ Surgeon: Melvenia Beam Procedure Performed:  endoscopic septoplasty 30520 Removal of adenoid thornwaldt's cyst 31237  PREOPERATIVE DIAGNOSIS: septal deviation, adenoid thornwaldt's cyst POSTOPERATIVE DIAGNOSIS: septal deviation, adenoid thornwaldt's cyst  SURGEON: Melvenia Beam ANESTHESIA: General endotracheal.  ESTIMATED BLOOD LOSS: less then 50 mL.  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 The patient's nose was inspected and submucosal injections of lidocaine 1% with epinephrine 1:100,000 were placed in the septum and greater palatine foramina in the standard fashion. The nose was decongested with Afrin-coated pledgets which were then removed. The patient was prepped and draped in the usual sterile fashion. After the Afrin pledgets were removed, the inferior turbinates were infractured.  I first began with the septoplasty. Using the zero degree endoscope a Killian incision was made on the right side of the septum using a #15 blade. The Cottle elevator was then used to elevate a submucoperichondrial and submucoperiostial  Flap on the right. The cartilaginous septum incised anteriorly using the cartilage knife leaving a generous 1cm anterior and superior strut. A submucoperichondrial and submucopeiosteal flap was elevated on the opposite (left)  side as well. The deviated portions of the cartilage and bone were resected as needed using the Jansen-Middleton, Thru-Cuts, and Blakesley forceps. I took care to remove the anterior leftward-deflected deviated cartilage and large obstructive left sided septal spurs. Once the septum was nicely midline and  the left nasal cavity was much more patent, the mucosal flaps were then reapproximated and sutured using a through and through 4-0 plain gut whip stitch. The patient's nasal airway was inspected and was found to be widely patent and much more patent on the left vs. Pre-op. The incision site was then also closed with the same plain gut suture.  The 0 degree scope was used to identify the thornwaldt's cyst in the adenoid pad posteriorly, and this was removed using the straight Blakesley and passed off for pathology. The biopsy/cyst excision site was then cauterized as needed using the suction Bovie.  Once hemostasis was noted the septum was bolstered using mupirocin-coated Doyle splints which were secured using a transcollumellar 3-0 Nylon suture with the knot in the left nostril. The nose and stomach were suctioned out and the patient was turned back to anesthesia and awakened from anesthesia and extubated without difficulty. The patient tolerated the procedure well with no immediate complications and was taken to the postoperative recovery area in good condition.   Dr. Melvenia Beam was present and performed the entire procedure. 04/05/2013  9:11 AM Melvenia Beam

## 2013-04-05 NOTE — Anesthesia Postprocedure Evaluation (Signed)
  Anesthesia Post-op Note  Patient: Jason [name]  Procedure(s) Performed: Procedure(s): CYST REMOVAL (N/A) SEPTOPLASTY (N/A)  Patient Location: PACU  Anesthesia Type:General  Level of Consciousness: awake, oriented, sedated and patient cooperative  Airway and Oxygen Therapy: Patient Spontanous Breathing  Post-op Pain: mild  Post-op Assessment: Post-op Vital signs reviewed, Patient's Cardiovascular Status Stable, Respiratory Function Stable, Patent Airway, No signs of Nausea or vomiting and Pain level controlled  Post-op Vital Signs: stable  Complications: No apparent anesthesia complications

## 2013-04-05 NOTE — Anesthesia Procedure Notes (Signed)
Procedure Name: Intubation Date/Time: 04/05/2013 7:47 AM Performed by: Melvenia Beam Pre-anesthesia Checklist: Patient identified, Emergency Drugs available, Suction available, Patient being monitored and Timeout performed Patient Re-evaluated:Patient Re-evaluated prior to inductionOxygen Delivery Method: Circle system utilized Preoxygenation: Pre-oxygenation with 100% oxygen Intubation Type: IV induction Ventilation: Mask ventilation without difficulty Laryngoscope size: Elective Glidescope. Grade View: Grade I Tube type: Oral Tube size: 7.5 mm Number of attempts: 1 Airway Equipment and Method: Stylet and Video-laryngoscopy Placement Confirmation: ETT inserted through vocal cords under direct vision,  positive ETCO2 and breath sounds checked- equal and bilateral Secured at: 22 cm Tube secured with: Tape Dental Injury: Teeth and Oropharynx as per pre-operative assessment

## 2013-04-05 NOTE — Anesthesia Preprocedure Evaluation (Addendum)
Anesthesia Evaluation  Patient identified by MRN, date of birth, ID band Patient awake    Reviewed: Allergy & Precautions, H&P , NPO status , Patient's Chart, lab work & pertinent test results, reviewed documented beta blocker date and time   History of Anesthesia Complications (+) PONV  Airway Mallampati: II TM Distance: >3 FB Neck ROM: Full    Dental  (+) Teeth Intact and Dental Advisory Given   Pulmonary          Cardiovascular     Neuro/Psych Anxiety Depression    GI/Hepatic hiatal hernia, GERD-  ,  Endo/Other    Renal/GU      Musculoskeletal   Abdominal   Peds  Hematology   Anesthesia Other Findings   Reproductive/Obstetrics                          Anesthesia Physical Anesthesia Plan  ASA: II  Anesthesia Plan: General   Post-op Pain Management:    Induction: Intravenous  Airway Management Planned: Oral ETT  Additional Equipment:   Intra-op Plan:   Post-operative Plan: Extubation in OR  Informed Consent: I have reviewed the patients History and Physical, chart, labs and discussed the procedure including the risks, benefits and alternatives for the proposed anesthesia with the patient or authorized representative who has indicated his/her understanding and acceptance.   Dental advisory given  Plan Discussed with: CRNA, Anesthesiologist and Surgeon  Anesthesia Plan Comments:        Anesthesia Quick Evaluation

## 2013-04-05 NOTE — Transfer of Care (Signed)
Immediate Anesthesia Transfer of Care Note  Patient: Jason [name]  Procedure(s) Performed: Procedure(s): CYST REMOVAL (N/A) SEPTOPLASTY (N/A)  Patient Location: PACU  Anesthesia Type:General  Level of Consciousness: sedated  Airway & Oxygen Therapy: Patient Spontanous Breathing and Patient connected to face mask oxygen  Post-op Assessment: Report given to PACU RN and Post -op Vital signs reviewed and stable  Post vital signs: Reviewed and stable  Complications: No apparent anesthesia complications

## 2013-04-05 NOTE — Preoperative (Signed)
Beta Blockers   Reason not to administer Beta Blockers:Not Applicable 

## 2013-04-09 ENCOUNTER — Encounter (HOSPITAL_COMMUNITY): Payer: Self-pay | Admitting: Otolaryngology

## 2013-05-01 ENCOUNTER — Other Ambulatory Visit: Payer: Self-pay | Admitting: Gastroenterology

## 2013-05-01 DIAGNOSIS — R109 Unspecified abdominal pain: Secondary | ICD-10-CM

## 2013-05-01 DIAGNOSIS — R634 Abnormal weight loss: Secondary | ICD-10-CM

## 2013-05-02 ENCOUNTER — Other Ambulatory Visit: Payer: Self-pay

## 2013-05-06 ENCOUNTER — Ambulatory Visit
Admission: RE | Admit: 2013-05-06 | Discharge: 2013-05-06 | Disposition: A | Discharge status: Home / Self Care | Payer: Medicare Other | Source: Ambulatory Visit | Attending: Gastroenterology | Admitting: Gastroenterology

## 2013-05-06 DIAGNOSIS — R109 Unspecified abdominal pain: Secondary | ICD-10-CM

## 2013-05-06 DIAGNOSIS — R634 Abnormal weight loss: Secondary | ICD-10-CM

## 2013-05-06 MED ORDER — IOHEXOL 300 MG/ML  SOLN
100.0000 mL | Freq: Once | INTRAMUSCULAR | Status: AC | PRN
  Administered 2013-05-06: 100 mL via INTRAVENOUS

## 2013-06-18 ENCOUNTER — Other Ambulatory Visit: Payer: Self-pay | Admitting: Neurological Surgery

## 2013-06-18 DIAGNOSIS — M47812 Spondylosis without myelopathy or radiculopathy, cervical region: Secondary | ICD-10-CM

## 2013-06-19 ENCOUNTER — Ambulatory Visit
Admission: RE | Admit: 2013-06-19 | Discharge: 2013-06-19 | Disposition: A | Discharge status: Home / Self Care | Payer: Medicare Other | Source: Ambulatory Visit | Attending: Neurological Surgery | Admitting: Neurological Surgery

## 2013-06-19 DIAGNOSIS — M47812 Spondylosis without myelopathy or radiculopathy, cervical region: Secondary | ICD-10-CM

## 2013-06-21 ENCOUNTER — Ambulatory Visit
Admission: RE | Admit: 2013-06-21 | Discharge: 2013-06-21 | Disposition: A | Discharge status: Home / Self Care | Payer: Medicare Other | Source: Ambulatory Visit | Attending: Neurological Surgery | Admitting: Neurological Surgery

## 2013-06-23 ENCOUNTER — Other Ambulatory Visit: Payer: Medicare Other

## 2013-06-24 ENCOUNTER — Emergency Department (HOSPITAL_COMMUNITY)
Admission: EM | Admit: 2013-06-24 | Discharge: 2013-06-25 | Disposition: A | Discharge status: Home / Self Care | Payer: Medicare Other | Attending: Emergency Medicine | Admitting: Emergency Medicine

## 2013-06-24 ENCOUNTER — Emergency Department (HOSPITAL_COMMUNITY): Payer: Medicare Other

## 2013-06-24 ENCOUNTER — Encounter (HOSPITAL_COMMUNITY): Payer: Self-pay | Admitting: Emergency Medicine

## 2013-06-24 DIAGNOSIS — Z87442 Personal history of urinary calculi: Secondary | ICD-10-CM | POA: Insufficient documentation

## 2013-06-24 DIAGNOSIS — Z87448 Personal history of other diseases of urinary system: Secondary | ICD-10-CM | POA: Insufficient documentation

## 2013-06-24 DIAGNOSIS — Z860100 Personal history of colon polyps, unspecified: Secondary | ICD-10-CM | POA: Insufficient documentation

## 2013-06-24 DIAGNOSIS — R42 Dizziness and giddiness: Secondary | ICD-10-CM | POA: Insufficient documentation

## 2013-06-24 DIAGNOSIS — R51 Headache: Secondary | ICD-10-CM | POA: Insufficient documentation

## 2013-06-24 DIAGNOSIS — Z87891 Personal history of nicotine dependence: Secondary | ICD-10-CM | POA: Insufficient documentation

## 2013-06-24 DIAGNOSIS — Z8719 Personal history of other diseases of the digestive system: Secondary | ICD-10-CM | POA: Insufficient documentation

## 2013-06-24 DIAGNOSIS — M129 Arthropathy, unspecified: Secondary | ICD-10-CM | POA: Insufficient documentation

## 2013-06-24 DIAGNOSIS — M542 Cervicalgia: Secondary | ICD-10-CM | POA: Insufficient documentation

## 2013-06-24 DIAGNOSIS — Z8639 Personal history of other endocrine, nutritional and metabolic disease: Secondary | ICD-10-CM | POA: Insufficient documentation

## 2013-06-24 DIAGNOSIS — Z791 Long term (current) use of non-steroidal anti-inflammatories (NSAID): Secondary | ICD-10-CM | POA: Insufficient documentation

## 2013-06-24 DIAGNOSIS — Z872 Personal history of diseases of the skin and subcutaneous tissue: Secondary | ICD-10-CM | POA: Insufficient documentation

## 2013-06-24 DIAGNOSIS — F411 Generalized anxiety disorder: Secondary | ICD-10-CM | POA: Insufficient documentation

## 2013-06-24 DIAGNOSIS — R4789 Other speech disturbances: Secondary | ICD-10-CM | POA: Insufficient documentation

## 2013-06-24 DIAGNOSIS — Z862 Personal history of diseases of the blood and blood-forming organs and certain disorders involving the immune mechanism: Secondary | ICD-10-CM | POA: Insufficient documentation

## 2013-06-24 DIAGNOSIS — R6884 Jaw pain: Secondary | ICD-10-CM | POA: Insufficient documentation

## 2013-06-24 LAB — POCT I-STAT, CHEM 8
Chloride: 101 mEq/L (ref 96–112)
Glucose, Bld: 108 mg/dL — ABNORMAL HIGH (ref 70–99)
HCT: 37 % — ABNORMAL LOW (ref 39.0–52.0)
Hemoglobin: 12.6 g/dL — ABNORMAL LOW (ref 13.0–17.0)
Potassium: 4.2 mEq/L (ref 3.5–5.1)
Sodium: 138 mEq/L (ref 135–145)

## 2013-06-24 LAB — CBC
MCHC: 34.2 g/dL (ref 30.0–36.0)
Platelets: 356 10*3/uL (ref 150–400)
RBC: 4.27 MIL/uL (ref 4.22–5.81)
RDW: 13.1 % (ref 11.5–15.5)

## 2013-06-24 LAB — SEDIMENTATION RATE: Sed Rate: 70 mm/hr — ABNORMAL HIGH (ref 0–16)

## 2013-06-24 MED ORDER — PREDNISONE 20 MG PO TABS
60.0000 mg | ORAL_TABLET | Freq: Once | ORAL | Status: AC
  Administered 2013-06-25: 60 mg via ORAL
  Filled 2013-06-24: qty 3

## 2013-06-24 NOTE — ED Notes (Signed)
Per pt and family pt having severe head pain and neck pain. sts pain is also in both ears and radiates down. sts since this am pt has had numbness on the left side of his tongue and unable to use the left side of his mouth this morning when eating. Speech slightly slurred. No facial droop noted.

## 2013-06-24 NOTE — ED Provider Notes (Signed)
CSN: [id number]     Arrival date & time 06/24/13  1849 History   First MD Initiated Contact with Patient 06/24/13 2049     Chief Complaint  Patient presents with  . Headache  . Neck Pain   (Consider location/radiation/quality/duration/timing/severity/associated sxs/prior Treatment) HPI Comments: Jason [name] is a 79 y.o. year-old male with a past medical history of arthritis, Dizziness, presenting the Emergency Department with a chief complaint of decreased mobility in his tongue.  The patient wife reports upon waking up at 1000 today he had slurred speech.  He reports while eating breakfast he had trouble moving his food bolus from left to right in his mouth.  He denies chocking or inability to hold food bolus in mouth. He also complains of Left jaw pain.  He reports decreased in sound and states that his hearing as if it was underwater on the left side. He states he is unable to lay on his left side due to the pain. He denies fever or chills.  He reports chronic dizziness and has been evaluated by his PCP: Jacky Kindle in the past for this. Neurologist: Everardo All.  Reports long time oral tobacco use and occasional cigar use.   Patient is a 79 y.o. male presenting with headaches and neck pain. The history is provided by the patient, the spouse and medical records. No language interpreter was used.  Headache Associated symptoms: dizziness and neck pain   Associated symptoms: no abdominal pain, no diarrhea, no fever, no nausea, no numbness and no vomiting   Neck Pain Associated symptoms: headaches   Associated symptoms: no fever and no numbness     Past Medical History  Diagnosis Date  . Arthritis   . Insomnia     takes Trazodone nightly  . PONV (postoperative nausea and vomiting)   . Hyperlipidemia     was on medication but has been off for a while  . Dizziness     was taking Meclizine but doesn't take now;found out that HR was 45  . Joint pain   . Joint swelling   . Itchy skin    scalp and uses a cream  . H/O hiatal hernia     takes Omeprazole daily  . Hemorrhoids   . Constipation   . History of colon polyps   . History of colitis     many yrs ago  . Urinary frequency   . History of kidney stones     passed on his own  . Enlarged prostate   . Anxiety     takes Diazepam daily prn  . Depression     takes Trazodone nightly  . Insomnia     takes Trazodone nightly   Past Surgical History  Procedure Laterality Date  . Right knee arthroscopy    . Hernia repair      double  . Hemorrhoidectomy with hemorrhoid banding    . Tonsillectomy    . Bilateral cataract surgery    . Circumcision    . Colonoscopy    . Esophagogastroduodenoscopy    . Ear cyst excision N/A 04/05/2013    Procedure: CYST REMOVAL;  Surgeon: Melvenia Beam, MD;  Location: [facility] OR;  Service: ENT;  Laterality: N/A;  . Septoplasty N/A 04/05/2013    Procedure: SEPTOPLASTY;  Surgeon: Melvenia Beam, MD;  Location: [facility] OR;  Service: ENT;  Laterality: N/A;   History reviewed. No pertinent family history. History  Substance Use Topics  . Smoking status: Former Games developer  . Smokeless tobacco: Not  on file     Comment: quit chewing tobacco several months ago and stopped smoking cigars yrs ago  . Alcohol Use: No    Review of Systems  Constitutional: Negative for fever and chills.  Gastrointestinal: Negative for nausea, vomiting, abdominal pain and diarrhea.  Musculoskeletal: Positive for neck pain.  Neurological: Positive for dizziness, speech difficulty and headaches. Negative for syncope, facial asymmetry and numbness.    Allergies  Bee pollen and Tetanus toxoids  Home Medications   Current Outpatient Rx  Name  Route  Sig  Dispense  Refill  . betamethasone dipropionate (DIPROLENE) 0.05 % cream   Topical   Apply 1 application topically daily as needed (itchy scalp).         . diazepam (VALIUM) 5 MG tablet   Oral   Take 2.5 mg by mouth daily as needed for anxiety.          . meloxicam  (MOBIC) 15 MG tablet   Oral   Take 15 mg by mouth daily.         . naproxen sodium (ANAPROX) 220 MG tablet   Oral   Take 440 mg by mouth 2 (two) times daily as needed (for pain).          BP 113/61  Pulse 70  Temp(Src) 98.5 F (36.9 C) (Oral)  Resp 24  SpO2 99% Physical Exam  Constitutional: He is oriented to person, place, and time. He appears well-developed.  HENT:  Head: Normocephalic and atraumatic.    Right Ear: Tympanic membrane normal. No drainage. No mastoid tenderness.  Left Ear: Tympanic membrane normal. No drainage. No mastoid tenderness.  Nose: Rhinorrhea present.  Mouth/Throat: No dental abscesses.  No tenderness to palpation of the temples. No roping of the temporal arteries.  Erythema to bilateral external canals.  No swelling of posterior oropharynx. No loss of arches.   Eyes: EOM are normal. Pupils are equal, round, and reactive to light.  Neck: Normal range of motion. Neck supple.  Cardiovascular: Normal rate, regular rhythm and normal heart sounds.   Pulmonary/Chest: Effort normal. No respiratory distress. He has no wheezes. He has no rales.  Abdominal: Soft. Bowel sounds are normal. He exhibits no distension. There is no tenderness. There is no rebound.  Neurological: He is alert and oriented to person, place, and time. No sensory deficit. He exhibits normal muscle tone. Coordination normal. GCS eye subscore is 4. GCS verbal subscore is 5. GCS motor subscore is 6.  CN 2-12 intact  Skin: Skin is warm and dry.  Psychiatric: He has a normal mood and affect.    ED Course  Procedures (including critical care time) Labs Review Labs Reviewed  SEDIMENTATION RATE - Abnormal; Notable for the following:    Sed Rate 70 (*)    All other components within normal limits  CBC - Abnormal; Notable for the following:    WBC 12.8 (*)    Hemoglobin 12.5 (*)    HCT 36.6 (*)    All other components within normal limits  POCT I-STAT, CHEM 8 - Abnormal; Notable for the  following:    BUN 26 (*)    Glucose, Bld 108 (*)    Hemoglobin 12.6 (*)    HCT 37.0 (*)    All other components within normal limits   Imaging Review Ct Head Wo Contrast  06/24/2013   CLINICAL DATA:  Severe headache, neck pain, ear pain, left-sided numbness slurred speech.  EXAM: CT HEAD WITHOUT CONTRAST  TECHNIQUE: Contiguous axial  images were obtained from the base of the skull through the vertex without intravenous contrast.  COMPARISON:  MRI of the cervical spine June 21, 2013.  FINDINGS: The ventricles and sulci are normal for age. No intraparenchymal hemorrhage, mass effect nor midline shift. Patchy supratentorial white matter hypodensities are within normal range for patient's age and though non-specific suggest sequelae of chronic small vessel ischemic disease. No acute large vascular territory infarcts.  No abnormal extra-axial fluid collections. Basal cisterns are patent. Moderate calcific atherosclerosis of the carotid siphons.  No skull fracture. Mildly atretic maxillary sinuses with left maxillary sinus mucosal thickening, no paranasal sinus air-fluid levels. Mild sphenoid mucosal thickening. . The included ocular globes and orbital contents are non-suspicious. Status post bilateral ocular lens implants. Asymmetric fullness of the nasopharyngeal soft tissues, greater on the left ; equivocal underlying clival osteopenia, which could be artifact due to head positioning within the scanner.  IMPRESSION: No acute intracranial process ; normal noncontrast CT of the head for age.  Chronic paranasal sinusitis. Asymmetrically prominent nasopharyngeal soft tissues, recommend direct inspection.   Electronically Signed   By: Awilda Metro   On: 06/24/2013 22:30    EKG Interpretation   None       MDM   1. Head ache    Patient with a slurred speech since waking up. CN intact. Labs sent.  Discussed patient history, condition, and labs with Dr. Ethelda Chick who advises CT and sed rate. CT  shows asymmetrically nasopharyngeal soft tissues, this does not correlate with PE and there was no swelling noted on exam. Elevated Sed-rate, will treat with prednisone for possible temporal arteritis.  I re-examined the area and did not see an area of swelling. Discussed setting up an out-patient procedure for temporal biopsy. Discussed lab results, imaging results, and treatment plan with the patient. Return precautions given. Reports understanding and no other concerns at this time.  Patient is stable for discharge at this time.   Meds given in ED:  Medications  predniSONE (DELTASONE) tablet 60 mg (60 mg Oral Given 06/25/13 0015)    Discharge Medication List as of 06/25/2013 12:44 AM    START taking these medications   Details  predniSONE (DELTASONE) 20 MG tablet Take 2 tablets (40 mg total) by mouth daily., Starting 06/25/2013, Until Discontinued, Print            Clabe Seal, PA-C 06/26/13 [phone]

## 2013-06-24 NOTE — ED Provider Notes (Signed)
Patient presents with headache at right temple area to the center of his head into the ears bilaterally with drainage in the back of his throat for the past 3 weeks. He's been treating himself with Mobic and with Aleve with partial relief. No fever. Patient is also had neck pain for several years His speech was slightly slurred this morning but has resolved. No focal numbness or weakness. No difficulty with gait. On exam alert Glasgow Coma Score 15 cranial nerves II through XII grossly intact motor strength 5 over 5 overall DTRs symmetric bilaterally knee jerk and ankle jerk and biceps toes are bilaterally Patient exhibiting no signs of stroke. Neck Pain is chronic  Doug Sou, MD 06/25/13 [phone]

## 2013-06-25 MED ORDER — PREDNISONE 20 MG PO TABS: 40.0000 mg | ORAL_TABLET | Freq: Every day | ORAL | Status: DC

## 2013-06-26 ENCOUNTER — Encounter (HOSPITAL_COMMUNITY)
Admission: RE | Disposition: A | Discharge status: Home / Self Care | Payer: Self-pay | Source: Ambulatory Visit | Attending: Otolaryngology

## 2013-06-26 ENCOUNTER — Encounter (HOSPITAL_COMMUNITY): Payer: Self-pay | Admitting: Certified Registered Nurse Anesthetist

## 2013-06-26 ENCOUNTER — Ambulatory Visit (HOSPITAL_COMMUNITY): Payer: Medicare Other | Admitting: Certified Registered Nurse Anesthetist

## 2013-06-26 ENCOUNTER — Ambulatory Visit (HOSPITAL_COMMUNITY)
Admission: RE | Admit: 2013-06-26 | Discharge: 2013-06-26 | Disposition: A | Discharge status: Home / Self Care | Payer: Medicare Other | Source: Ambulatory Visit | Attending: Otolaryngology | Admitting: Otolaryngology

## 2013-06-26 ENCOUNTER — Encounter (HOSPITAL_COMMUNITY): Payer: Medicare Other | Admitting: Certified Registered Nurse Anesthetist

## 2013-06-26 DIAGNOSIS — E785 Hyperlipidemia, unspecified: Secondary | ICD-10-CM | POA: Insufficient documentation

## 2013-06-26 DIAGNOSIS — R51 Headache: Secondary | ICD-10-CM | POA: Insufficient documentation

## 2013-06-26 DIAGNOSIS — Z87891 Personal history of nicotine dependence: Secondary | ICD-10-CM | POA: Insufficient documentation

## 2013-06-26 DIAGNOSIS — J392 Other diseases of pharynx: Secondary | ICD-10-CM

## 2013-06-26 DIAGNOSIS — F3289 Other specified depressive episodes: Secondary | ICD-10-CM | POA: Insufficient documentation

## 2013-06-26 DIAGNOSIS — R1310 Dysphagia, unspecified: Secondary | ICD-10-CM | POA: Insufficient documentation

## 2013-06-26 DIAGNOSIS — F329 Major depressive disorder, single episode, unspecified: Secondary | ICD-10-CM | POA: Insufficient documentation

## 2013-06-26 DIAGNOSIS — R220 Localized swelling, mass and lump, head: Secondary | ICD-10-CM | POA: Insufficient documentation

## 2013-06-26 DIAGNOSIS — N400 Enlarged prostate without lower urinary tract symptoms: Secondary | ICD-10-CM | POA: Insufficient documentation

## 2013-06-26 DIAGNOSIS — R471 Dysarthria and anarthria: Secondary | ICD-10-CM | POA: Insufficient documentation

## 2013-06-26 HISTORY — PX: DIRECT LARYNGOSCOPY: SHX5326

## 2013-06-26 SURGERY — BIOPSY, NASOPHARYNX
Anesthesia: General | Site: Nose

## 2013-06-26 MED ORDER — FENTANYL CITRATE 0.05 MG/ML IJ SOLN
INTRAMUSCULAR | Status: DC | PRN
  Administered 2013-06-26: 75 ug via INTRAVENOUS

## 2013-06-26 MED ORDER — FENTANYL CITRATE 0.05 MG/ML IJ SOLN
INTRAMUSCULAR | Status: AC
  Filled 2013-06-26: qty 2

## 2013-06-26 MED ORDER — OXYMETAZOLINE HCL 0.05 % NA SOLN
NASAL | Status: DC | PRN
  Administered 2013-06-26: 4 via NASAL

## 2013-06-26 MED ORDER — OXYMETAZOLINE HCL 0.05 % NA SOLN
NASAL | Status: AC
  Filled 2013-06-26: qty 15

## 2013-06-26 MED ORDER — PROPOFOL 10 MG/ML IV BOLUS
INTRAVENOUS | Status: DC | PRN
  Administered 2013-06-26: 110 mg via INTRAVENOUS
  Administered 2013-06-26: 50 mg via INTRAVENOUS

## 2013-06-26 MED ORDER — LIDOCAINE-EPINEPHRINE 1 %-1:100000 IJ SOLN
INTRAMUSCULAR | Status: AC
  Filled 2013-06-26: qty 1

## 2013-06-26 MED ORDER — MIDAZOLAM HCL 2 MG/2ML IJ SOLN: 1.0000 mg | INTRAMUSCULAR | Status: DC | PRN

## 2013-06-26 MED ORDER — DEXAMETHASONE SODIUM PHOSPHATE 4 MG/ML IJ SOLN
INTRAMUSCULAR | Status: DC | PRN
  Administered 2013-06-26: 8 mg via INTRAVENOUS

## 2013-06-26 MED ORDER — ONDANSETRON HCL 4 MG/2ML IJ SOLN
INTRAMUSCULAR | Status: DC | PRN
  Administered 2013-06-26: 4 mg via INTRAVENOUS

## 2013-06-26 MED ORDER — OXYCODONE HCL 5 MG PO TABS
ORAL_TABLET | ORAL | Status: AC
  Filled 2013-06-26: qty 1

## 2013-06-26 MED ORDER — LACTATED RINGERS IV SOLN
INTRAVENOUS | Status: DC
  Administered 2013-06-26: 12:00:00 via INTRAVENOUS

## 2013-06-26 MED ORDER — LIDOCAINE HCL (CARDIAC) 20 MG/ML IV SOLN
INTRAVENOUS | Status: DC | PRN
  Administered 2013-06-26: 80 mg via INTRAVENOUS

## 2013-06-26 MED ORDER — SUCCINYLCHOLINE CHLORIDE 20 MG/ML IJ SOLN
INTRAMUSCULAR | Status: DC | PRN
  Administered 2013-06-26: 100 mg via INTRAVENOUS

## 2013-06-26 MED ORDER — FENTANYL CITRATE 0.05 MG/ML IJ SOLN: 50.0000 ug | Freq: Once | INTRAMUSCULAR | Status: DC

## 2013-06-26 MED ORDER — FENTANYL CITRATE 0.05 MG/ML IJ SOLN
25.0000 ug | INTRAMUSCULAR | Status: DC | PRN
  Administered 2013-06-26 (×2): 25 ug via INTRAVENOUS

## 2013-06-26 MED ORDER — OXYCODONE HCL 5 MG/5ML PO SOLN: 5.0000 mg | Freq: Once | ORAL | Status: AC | PRN

## 2013-06-26 MED ORDER — OXYMETAZOLINE HCL 0.05 % NA SOLN
NASAL | Status: DC | PRN
  Administered 2013-06-26: 1 via NASAL

## 2013-06-26 MED ORDER — MIDAZOLAM HCL 5 MG/5ML IJ SOLN
INTRAMUSCULAR | Status: DC | PRN
  Administered 2013-06-26: 1 mg via INTRAVENOUS

## 2013-06-26 MED ORDER — ARTIFICIAL TEARS OP OINT
TOPICAL_OINTMENT | OPHTHALMIC | Status: DC | PRN
  Administered 2013-06-26: 1 via OPHTHALMIC

## 2013-06-26 MED ORDER — OXYCODONE HCL 5 MG PO TABS
5.0000 mg | ORAL_TABLET | Freq: Once | ORAL | Status: AC | PRN
  Administered 2013-06-26: 5 mg via ORAL

## 2013-06-26 MED ORDER — 0.9 % SODIUM CHLORIDE (POUR BTL) OPTIME
TOPICAL | Status: DC | PRN
  Administered 2013-06-26: 1000 mL

## 2013-06-26 MED ORDER — EPINEPHRINE HCL (NASAL) 0.1 % NA SOLN
NASAL | Status: AC
  Filled 2013-06-26: qty 30

## 2013-06-26 MED ORDER — LACTATED RINGERS IV SOLN
INTRAVENOUS | Status: DC | PRN
  Administered 2013-06-26: 14:00:00 via INTRAVENOUS

## 2013-06-26 SURGICAL SUPPLY — 39 items
BALLN PULM 15 16.5 18X75 (BALLOONS)
BALLOON PULM 15 16.5 18X75 (BALLOONS) IMPLANT
CANISTER SUCTION 2500CC (MISCELLANEOUS) ×2 IMPLANT
CATH ROBINSON RED A/P 10FR (CATHETERS) ×2 IMPLANT
CLEANER TIP ELECTROSURG 2X2 (MISCELLANEOUS) IMPLANT
CLOTH BEACON ORANGE TIMEOUT ST (SAFETY) IMPLANT
COAGULATOR SUCT SWTCH 10FR 6 (ELECTROSURGICAL) IMPLANT
CONT SPEC 4OZ CLIKSEAL STRL BL (MISCELLANEOUS) ×2 IMPLANT
COVER MAYO STAND STRL (DRAPES) IMPLANT
COVER TABLE BACK 60X90 (DRAPES) ×2 IMPLANT
CRADLE DONUT ADULT HEAD (MISCELLANEOUS) ×2 IMPLANT
DECANTER SPIKE VIAL GLASS SM (MISCELLANEOUS) ×2 IMPLANT
DRAPE PROXIMA HALF (DRAPES) ×2 IMPLANT
ELECT COATED BLADE 2.86 ST (ELECTRODE) IMPLANT
ELECT REM PT RETURN 9FT PED (ELECTROSURGICAL)
ELECTRODE REM PT RETRN 9FT PED (ELECTROSURGICAL) IMPLANT
GAUZE SPONGE 4X4 16PLY XRAY LF (GAUZE/BANDAGES/DRESSINGS) ×2 IMPLANT
GLOVE BIO SURGEON STRL SZ7.5 (GLOVE) ×6 IMPLANT
GLOVE BIOGEL PI IND STRL 7.0 (GLOVE) ×3 IMPLANT
GLOVE BIOGEL PI INDICATOR 7.0 (GLOVE) ×3
GOWN STRL NON-REIN LRG LVL3 (GOWN DISPOSABLE) ×8 IMPLANT
GUARD TEETH (MISCELLANEOUS) IMPLANT
KIT BASIN OR (CUSTOM PROCEDURE TRAY) ×2 IMPLANT
KIT ROOM TURNOVER OR (KITS) ×2 IMPLANT
MARKER SKIN DUAL TIP RULER LAB (MISCELLANEOUS) IMPLANT
NS IRRIG 1000ML POUR BTL (IV SOLUTION) ×2 IMPLANT
PAD ARMBOARD 7.5X6 YLW CONV (MISCELLANEOUS) ×2 IMPLANT
PATTIES SURGICAL .5 X3 (DISPOSABLE) ×2 IMPLANT
SOLUTION ANTI FOG 6CC (MISCELLANEOUS) IMPLANT
SPECIMEN JAR SMALL (MISCELLANEOUS) IMPLANT
SPONGE GAUZE 4X4 12PLY (GAUZE/BANDAGES/DRESSINGS) IMPLANT
SPONGE TONSIL 1.25 RF SGL STRG (GAUZE/BANDAGES/DRESSINGS) ×2 IMPLANT
SURGILUBE 2OZ TUBE FLIPTOP (MISCELLANEOUS) IMPLANT
SYR BULB 3OZ (MISCELLANEOUS) ×2 IMPLANT
SYR INFLATE BILIARY GAUGE (MISCELLANEOUS) IMPLANT
TOWEL OR 17X24 6PK STRL BLUE (TOWEL DISPOSABLE) ×2 IMPLANT
TRAY ENT MC OR (CUSTOM PROCEDURE TRAY) ×2 IMPLANT
TUBE CONNECTING 12X1/4 (SUCTIONS) ×2 IMPLANT
WATER STERILE IRR 1000ML POUR (IV SOLUTION) ×2 IMPLANT

## 2013-06-26 NOTE — H&P (Signed)
Jason [name] is an 79 y.o. male.   Chief Complaint: left nasopharynx mass, left tongue and palate weakness HPI: 79 year old male with three weeks or more of left-sided headaches.  Early this week, tongue movement became abnormal and he went to the ER.  A head CT demonstrated fullness in the left nasopharynx which corresponded to an irregular fullness seen on endoscopy subsequently at the office.  He presents for biopsy.  Past Medical History  Diagnosis Date  . Arthritis   . Insomnia     takes Trazodone nightly  . PONV (postoperative nausea and vomiting)   . Hyperlipidemia     was on medication but has been off for a while  . Dizziness     was taking Meclizine but doesn't take now;found out that HR was 45  . Joint pain   . Joint swelling   . Itchy skin     scalp and uses a cream  . H/O hiatal hernia     takes Omeprazole daily  . Hemorrhoids   . Constipation   . History of colon polyps   . History of colitis     many yrs ago  . Urinary frequency   . History of kidney stones     passed on his own  . Enlarged prostate   . Anxiety     takes Diazepam daily prn  . Depression     takes Trazodone nightly  . Insomnia     takes Trazodone nightly    Past Surgical History  Procedure Laterality Date  . Right knee arthroscopy    . Hernia repair      double  . Hemorrhoidectomy with hemorrhoid banding    . Tonsillectomy    . Bilateral cataract surgery    . Circumcision    . Colonoscopy    . Esophagogastroduodenoscopy    . Ear cyst excision N/A 04/05/2013    Procedure: CYST REMOVAL;  Surgeon: Melvenia Beam, MD;  Location: [facility] OR;  Service: ENT;  Laterality: N/A;  . Septoplasty N/A 04/05/2013    Procedure: SEPTOPLASTY;  Surgeon: Melvenia Beam, MD;  Location: [facility] OR;  Service: ENT;  Laterality: N/A;    History reviewed. No pertinent family history. Social History:  reports that he has quit smoking. He has never used smokeless tobacco. He reports that he does not drink alcohol or  use illicit drugs.  Allergies:  Allergies  Allergen Reactions  . Bee Pollen Anaphylaxis  . Tetanus Toxoids Swelling    Tetanus Shot  . Hydrocodone Other (See Comments)    Confusion, "talking out of his head"    Medications Prior to Admission  Medication Sig Dispense Refill  . betamethasone dipropionate (DIPROLENE) 0.05 % cream Apply 1 application topically daily as needed (itchy scalp).      . diazepam (VALIUM) 5 MG tablet Take 2.5 mg by mouth daily as needed for anxiety.       . meloxicam (MOBIC) 15 MG tablet Take 15 mg by mouth daily.      . naproxen sodium (ANAPROX) 220 MG tablet Take 220 mg by mouth every 6 (six) hours as needed.      . predniSONE (DELTASONE) 20 MG tablet Take 2 tablets (40 mg total) by mouth daily.  12 tablet  0    Results for orders placed during the hospital encounter of 06/24/13 (from the past 48 hour(s))  SEDIMENTATION RATE     Status: Abnormal   Collection Time    06/24/13  9:40 PM  Result Value Range   Sed Rate 70 (*) 0 - 16 mm/hr  CBC     Status: Abnormal   Collection Time    06/24/13  9:40 PM      Result Value Range   WBC 12.8 (*) 4.0 - 10.5 K/uL   RBC 4.27  4.22 - 5.81 MIL/uL   Hemoglobin 12.5 (*) 13.0 - 17.0 g/dL   HCT 09.8 (*) 11.9 - 14.7 %   MCV 85.7  78.0 - 100.0 fL   MCH 29.3  26.0 - 34.0 pg   MCHC 34.2  30.0 - 36.0 g/dL   RDW 82.9  56.2 - 13.0 %   Platelets 356  150 - 400 K/uL  POCT I-STAT, CHEM 8     Status: Abnormal   Collection Time    06/24/13  9:52 PM      Result Value Range   Sodium 138  135 - 145 mEq/L   Potassium 4.2  3.5 - 5.1 mEq/L   Chloride 101  96 - 112 mEq/L   BUN 26 (*) 6 - 23 mg/dL   Creatinine, Ser 8.65  0.50 - 1.35 mg/dL   Glucose, Bld 784 (*) 70 - 99 mg/dL   Calcium, Ion 6.96  2.95 - 1.30 mmol/L   TCO2 25  0 - 100 mmol/L   Hemoglobin 12.6 (*) 13.0 - 17.0 g/dL   HCT 28.4 (*) 13.2 - 44.0 %   Ct Head Wo Contrast  06/24/2013   CLINICAL DATA:  Severe headache, neck pain, ear pain, left-sided numbness  slurred speech.  EXAM: CT HEAD WITHOUT CONTRAST  TECHNIQUE: Contiguous axial images were obtained from the base of the skull through the vertex without intravenous contrast.  COMPARISON:  MRI of the cervical spine June 21, 2013.  FINDINGS: The ventricles and sulci are normal for age. No intraparenchymal hemorrhage, mass effect nor midline shift. Patchy supratentorial white matter hypodensities are within normal range for patient's age and though non-specific suggest sequelae of chronic small vessel ischemic disease. No acute large vascular territory infarcts.  No abnormal extra-axial fluid collections. Basal cisterns are patent. Moderate calcific atherosclerosis of the carotid siphons.  No skull fracture. Mildly atretic maxillary sinuses with left maxillary sinus mucosal thickening, no paranasal sinus air-fluid levels. Mild sphenoid mucosal thickening. . The included ocular globes and orbital contents are non-suspicious. Status post bilateral ocular lens implants. Asymmetric fullness of the nasopharyngeal soft tissues, greater on the left ; equivocal underlying clival osteopenia, which could be artifact due to head positioning within the scanner.  IMPRESSION: No acute intracranial process ; normal noncontrast CT of the head for age.  Chronic paranasal sinusitis. Asymmetrically prominent nasopharyngeal soft tissues, recommend direct inspection.   Electronically Signed   By: Awilda Metro   On: 06/24/2013 22:30    Review of Systems  Neurological: Positive for speech change and headaches.  All other systems reviewed and are negative.    Blood pressure 125/62, pulse 60, temperature 97.1 F (36.2 C), temperature source Oral, resp. rate 18, height 5\' 3"  (1.6 m), weight 56.246 kg (124 lb), SpO2 100.00%. Physical Exam  Constitutional: He is oriented to person, place, and time. He appears well-developed and well-nourished. No distress.  HENT:  Head: Normocephalic and atraumatic.  Right Ear: External  ear normal.  Left Ear: External ear normal.  Nose: Nose normal.  Mouth/Throat: Oropharynx is clear and moist.  Left hypoglossal weakness, left soft palate weakness.  Dysarthric.  Eyes: Conjunctivae and EOM are normal. Pupils are equal, round,  and reactive to light.  Neck: Normal range of motion. Neck supple.  Cardiovascular: Normal rate.   Respiratory: Effort normal.  GI:  Did not examine.  Genitourinary:  Did not examine.  Musculoskeletal: Normal range of motion.  Neurological: He is alert and oriented to person, place, and time. Cranial nerve deficit: As above.  Skin: Skin is warm and dry.  Psychiatric: He has a normal mood and affect. His behavior is normal. Judgment and thought content normal.     Assessment/Plan Left nasopharynx mass, left tongue and palate weakness, headache. To the OR for left nasopharynx biopsy.  Floy Angert 06/26/2013, 1:27 PM

## 2013-06-26 NOTE — Anesthesia Preprocedure Evaluation (Addendum)
Anesthesia Evaluation  Patient identified by MRN, date of birth, ID band Patient awake    Reviewed: Allergy & Precautions, H&P , NPO status , Patient's Chart, lab work & pertinent test results, reviewed documented beta blocker date and time   History of Anesthesia Complications (+) PONV  Airway Mallampati: II TM Distance: >3 FB Neck ROM: Full    Dental  (+) Teeth Intact   Pulmonary former smoker,  breath sounds clear to auscultation        Cardiovascular Rhythm:Regular Rate:Normal     Neuro/Psych  Headaches, Anxiety Depression Cervical stenosis    GI/Hepatic hiatal hernia, GERD-  ,  Endo/Other    Renal/GU      Musculoskeletal   Abdominal   Peds  Hematology   Anesthesia Other Findings Unexplained recent "stroke-like"  Sx--tongue deviation, swollowing difficulty, confusion Recent 25# weight loss  Reproductive/Obstetrics                        Anesthesia Physical Anesthesia Plan  ASA: III  Anesthesia Plan: General   Post-op Pain Management:    Induction: Intravenous  Airway Management Planned: Oral ETT  Additional Equipment:   Intra-op Plan:   Post-operative Plan: Extubation in OR  Informed Consent: I have reviewed the patients History and Physical, chart, labs and discussed the procedure including the risks, benefits and alternatives for the proposed anesthesia with the patient or authorized representative who has indicated his/her understanding and acceptance.     Plan Discussed with: CRNA and Surgeon  Anesthesia Plan Comments:        Anesthesia Quick Evaluation

## 2013-06-26 NOTE — Transfer of Care (Signed)
Immediate Anesthesia Transfer of Care Note  Patient: Jason [name]  Procedure(s) Performed: Procedure(s): NASOPHARYNGEAL BIOPSY (N/A) DIRECT LARYNGOSCOPY (N/A)  Patient Location: PACU  Anesthesia Type:General  Level of Consciousness: awake and alert   Airway & Oxygen Therapy: Patient Spontanous Breathing and Patient connected to nasal cannula oxygen  Post-op Assessment: Report given to PACU RN, Post -op Vital signs reviewed and stable and Patient moving all extremities X 4  Post vital signs: Reviewed and stable  Complications: No apparent anesthesia complications

## 2013-06-26 NOTE — ED Provider Notes (Signed)
Medical screening examination/treatment/procedure(s) were conducted as a shared visit with non-physician practitioner(s) and myself.  I personally evaluated the patient during the encounter.  EKG Interpretation   None        Doug Sou, MD 06/26/13 1056

## 2013-06-26 NOTE — Progress Notes (Signed)
Upon arriving at patients bedside, both wife and daughter became visually and audibly upset that there was "brusing" on the patients face to the left of the orbit.  This RN did not noticed ANY bruising save a small area on the eye lid which could have been a small vessel or nevus.  The family asked that Dr. Jenne Pane come to the hospital to evaluate the patient.  Anesthesia also called, and updated (Dr. Justin Mend).  Dr. Jenne Pane duly called, and the patients family's concern relayed to him.  He stated that he would try to come by later.  This was relayed to the patients family who were then dismayed that it might be a "long time".  The family settled in to wait and were given refreshments.  The RN's who had cared for the patient in PACU were consulted, they too had noted no bruising and stated that Dr. Jenne Pane had given the patient a very though Neurological exam in the PACU and noted no abnormalities.  Dr. Jenne Pane called back for updates, and was told the patients family concern.  He states that any bruising on his face was not caused by surgery, but may have been caused by the tape that was used to hold the eyelid down.  This was relayed to the family, and they they said they did not need Dr. Jenne Pane to come in after all.  They then stated that they did believe that the eyelid nevus was there before surgery, and the patient collaborated that it was there.    Upon leaving the PACU, the wife of the patient tried to state that this RN was the one upset and concerned about the "bruising' on the patients face.  I reiterated with the family that I never saw any bruising and that the concern was all theirs.  Patient was assisted to dress and taken to the car in good condition.

## 2013-06-26 NOTE — Anesthesia Postprocedure Evaluation (Signed)
Anesthesia Post Note  Patient: Jason [name]  Procedure(s) Performed: Procedure(s) (LRB): NASOPHARYNGEAL BIOPSY (N/A) DIRECT LARYNGOSCOPY (N/A)  Anesthesia type: General  Patient location: PACU  Post pain: Pain level controlled  Post assessment: Patient's Cardiovascular Status Stable  Last Vitals:  Filed Vitals:   06/26/13 1530  BP:   Pulse: 59  Temp:   Resp: 14    Post vital signs: Reviewed and stable  Level of consciousness: alert  Complications: No apparent anesthesia complications

## 2013-06-26 NOTE — Preoperative (Signed)
Beta Blockers   Reason not to administer Beta Blockers:Not Applicable 

## 2013-06-26 NOTE — Brief Op Note (Signed)
06/26/2013  2:41 PM  PATIENT:  [name]  79 y.o. male  PRE-OPERATIVE DIAGNOSIS:  nasopharyngeal mass  POST-OPERATIVE DIAGNOSIS:  nasopharyngeal mass  PROCEDURE:  TRANSNASAL NASOPHARYNGEAL BIOPSY  SURGEON:  Surgeon(s) and Role:    * Christia Reading, MD - Primary  PHYSICIAN ASSISTANT:   ASSISTANTS: none   ANESTHESIA:   general  EBL:  Total I/O In: -  Out: 50 [Blood:50]  BLOOD ADMINISTERED:none  DRAINS: none   LOCAL MEDICATIONS USED:  NONE  SPECIMEN:  Source of Specimen:  Left central and left lateral nasopharynx  DISPOSITION OF SPECIMEN:  PATHOLOGY  COUNTS:  YES  TOURNIQUET:  * No tourniquets in log *  DICTATION: .Other Dictation: Dictation Number [id number]  PLAN OF CARE: Discharge to home after PACU  PATIENT DISPOSITION:  PACU - hemodynamically stable.   Delay start of Pharmacological VTE agent (>24hrs) due to surgical blood loss or risk of bleeding: no

## 2013-06-27 NOTE — Op Note (Signed)
NAMEDOVID, BARTKO NO.:  [id number]  MEDICAL RECORD NO.:  [id number]  LOCATION:  [facility]                         FACILITY:  [facility]  PHYSICIAN:  Antony Contras, MD     DATE OF BIRTH:  [birth year]  DATE OF PROCEDURE:  06/26/2013 DATE OF DISCHARGE:  06/26/2013                              OPERATIVE REPORT   PREOPERATIVE DIAGNOSES: 1. Left nasopharynx mass. 2. Left hypoglossal weakness. 3. Left soft palate weakness.  POSTOPERATIVE DIAGNOSES: 1. Left nasopharynx mass. 2. Left hypoglossal weakness. 3. Left soft palate weakness.  PROCEDURE:  Transnasal biopsy of left nasopharynx.  SURGEON:  Antony Contras, MD.  ANESTHESIA:  General endotracheal anesthesia.  COMPLICATIONS:  None.  INDICATION:  The patient is an 80 year old white male with a few week history of headaches and a several day history of difficulty moving his tongue associated with dysarthria and dysphagia.  He was found to have weakness of the left side of his tongue as well as the soft palate.  A CT scan demonstrated fullness in the nasopharynx without a lot of detail.  Fiberoptic exam suggested an inflamed or full area in the left nasopharynx.  He presents to the operating room for surgical biopsy.  FINDINGS:  There was an area of granulation in the posterior nasopharynx, left of midline just posterior to the posterior choana. There was some yellow material within the area.  The big granulated area was biopsied.  Further biopsies revealed that the yellow material appears to be necrotic tissue within the midst of an erosive process that extends fairly deeply.  Further biopsies were taken in the area. The lateral nasopharynx appears normal, but biopsies were taken from what appeared to be a normal fossa of Rosenmuller.  DESCRIPTION OF PROCEDURE:  The patient was identified in the holding room.  Informed consent having been obtained including discussion of risks, benefits, alternatives, the  patient was brought to the operative suite and put on the operating table in supine position.  Anesthesia was induced and the patient was intubated by the Anesthesia team without difficulty.  Eyes were taped closed and bed was turned 90 degrees from anesthesia.  A head wrap was placed around the patient's head and a Crowe-Davis retractor was inserted in the mouth and opened via the oropharynx.  This was placed in suspension on Mayo stand.  A red rubber catheter was passed to the left nasal passage and pulled through mouth to provide anterior traction on the soft palate.  A laryngeal mirror was inserted and the nasopharynx examined.  See findings above.  A large cup forceps was then passed through the left nasal passage to the nasopharynx and while visualizing with the mirror, tissue was biopsied with a cup forceps.  About 5 or 6 passes were made in order to collect representative tissue and in this process, the lesion was able to be inspected somewhat and appeared to be a deep erosive process.  After adequate specimens were taken, cup forceps were used to collect mucosal specimens from the lateral nasopharynx as well and sent separately.  The specimens were sent in formalin.  An Afrin-soaked tonsil pack was placed in the nasopharynx, and the red  rubber catheter was removed.  After a couple of minutes, the tonsil pack was removed and the nose was copiously irrigated with saline and suctioned out of the throat. Retractor was taken out of suspension, and removed from the patient's mouth.  He was turned back to Anesthesia for wake up, was extubated, moved to recovery room in stable condition.     Antony Contraswight D Turhan Chill, MD     DDB/MEDQ  D:  06/26/2013  T:  06/27/2013  Job:  [id number]

## 2013-07-01 ENCOUNTER — Encounter (HOSPITAL_COMMUNITY): Payer: Self-pay | Admitting: Otolaryngology

## 2013-07-01 ENCOUNTER — Inpatient Hospital Stay (HOSPITAL_COMMUNITY): Payer: Medicare Other

## 2013-07-01 ENCOUNTER — Inpatient Hospital Stay (HOSPITAL_COMMUNITY)
Admission: AD | Admit: 2013-07-01 | Discharge: 2013-07-18 | DRG: 539 | Disposition: A | Discharge status: Home Health | Payer: Medicare Other | Source: Ambulatory Visit | Attending: Otolaryngology | Admitting: Otolaryngology

## 2013-07-01 ENCOUNTER — Other Ambulatory Visit: Payer: Medicare Other

## 2013-07-01 ENCOUNTER — Other Ambulatory Visit: Payer: Self-pay | Admitting: Otolaryngology

## 2013-07-01 DIAGNOSIS — R471 Dysarthria and anarthria: Secondary | ICD-10-CM | POA: Diagnosis present

## 2013-07-01 DIAGNOSIS — M542 Cervicalgia: Secondary | ICD-10-CM | POA: Diagnosis present

## 2013-07-01 DIAGNOSIS — R1312 Dysphagia, oropharyngeal phase: Secondary | ICD-10-CM | POA: Diagnosis not present

## 2013-07-01 DIAGNOSIS — Z87891 Personal history of nicotine dependence: Secondary | ICD-10-CM

## 2013-07-01 DIAGNOSIS — T380X5A Adverse effect of glucocorticoids and synthetic analogues, initial encounter: Secondary | ICD-10-CM | POA: Diagnosis present

## 2013-07-01 DIAGNOSIS — Z888 Allergy status to other drugs, medicaments and biological substances status: Secondary | ICD-10-CM

## 2013-07-01 DIAGNOSIS — J392 Other diseases of pharynx: Secondary | ICD-10-CM

## 2013-07-01 DIAGNOSIS — A498 Other bacterial infections of unspecified site: Secondary | ICD-10-CM

## 2013-07-01 DIAGNOSIS — B9689 Other specified bacterial agents as the cause of diseases classified elsewhere: Secondary | ICD-10-CM | POA: Diagnosis present

## 2013-07-01 DIAGNOSIS — E43 Unspecified severe protein-calorie malnutrition: Secondary | ICD-10-CM | POA: Insufficient documentation

## 2013-07-01 DIAGNOSIS — R51 Headache: Secondary | ICD-10-CM

## 2013-07-01 DIAGNOSIS — H6590 Unspecified nonsuppurative otitis media, unspecified ear: Secondary | ICD-10-CM | POA: Diagnosis present

## 2013-07-01 DIAGNOSIS — G523 Disorders of hypoglossal nerve: Secondary | ICD-10-CM | POA: Diagnosis present

## 2013-07-01 DIAGNOSIS — G521 Disorders of glossopharyngeal nerve: Secondary | ICD-10-CM | POA: Diagnosis present

## 2013-07-01 DIAGNOSIS — M869 Osteomyelitis, unspecified: Principal | ICD-10-CM | POA: Diagnosis present

## 2013-07-01 DIAGNOSIS — IMO0002 Reserved for concepts with insufficient information to code with codable children: Secondary | ICD-10-CM

## 2013-07-01 DIAGNOSIS — D72829 Elevated white blood cell count, unspecified: Secondary | ICD-10-CM | POA: Diagnosis present

## 2013-07-01 DIAGNOSIS — Z885 Allergy status to narcotic agent status: Secondary | ICD-10-CM

## 2013-07-01 LAB — CBC WITH DIFFERENTIAL/PLATELET
BASOS ABS: 0 10*3/uL (ref 0.0–0.1)
Basophils Relative: 0 % (ref 0–1)
Eosinophils Absolute: 0.2 10*3/uL (ref 0.0–0.7)
Eosinophils Relative: 1 % (ref 0–5)
HEMATOCRIT: 39.1 % (ref 39.0–52.0)
HEMOGLOBIN: 13.2 g/dL (ref 13.0–17.0)
Lymphocytes Relative: 16 % (ref 12–46)
Lymphs Abs: 3.6 10*3/uL (ref 0.7–4.0)
MCH: 28.8 pg (ref 26.0–34.0)
MCHC: 33.8 g/dL (ref 30.0–36.0)
MCV: 85.4 fL (ref 78.0–100.0)
MONO ABS: 2.5 10*3/uL — AB (ref 0.1–1.0)
MONOS PCT: 11 % (ref 3–12)
NEUTROS ABS: 16.5 10*3/uL — AB (ref 1.7–7.7)
Neutrophils Relative %: 72 % (ref 43–77)
Platelets: 417 10*3/uL — ABNORMAL HIGH (ref 150–400)
RBC: 4.58 MIL/uL (ref 4.22–5.81)
RDW: 13 % (ref 11.5–15.5)
WBC: 22.8 10*3/uL — AB (ref 4.0–10.5)

## 2013-07-01 LAB — BASIC METABOLIC PANEL
BUN: 35 mg/dL — ABNORMAL HIGH (ref 6–23)
CHLORIDE: 96 meq/L (ref 96–112)
CO2: 25 meq/L (ref 19–32)
Calcium: 9.4 mg/dL (ref 8.4–10.5)
Creatinine, Ser: 1.01 mg/dL (ref 0.50–1.35)
GFR calc Af Amer: 78 mL/min — ABNORMAL LOW (ref >90)
GFR calc non Af Amer: 68 mL/min — ABNORMAL LOW (ref >90)
Glucose, Bld: 91 mg/dL (ref 70–99)
Potassium: 4.6 mEq/L (ref 3.7–5.3)
SODIUM: 134 meq/L — AB (ref 137–147)

## 2013-07-01 LAB — MAGNESIUM: MAGNESIUM: 2.1 mg/dL (ref 1.5–2.5)

## 2013-07-01 MED ORDER — HYDROMORPHONE HCL PF 1 MG/ML IJ SOLN
0.5000 mg | INTRAMUSCULAR | Status: DC | PRN
  Administered 2013-07-01 – 2013-07-04 (×16): 0.5 mg via INTRAVENOUS
  Filled 2013-07-01 (×16): qty 1

## 2013-07-01 MED ORDER — TRIAMCINOLONE ACETONIDE 0.5 % EX CREA
TOPICAL_CREAM | Freq: Two times a day (BID) | CUTANEOUS | Status: DC
  Administered 2013-07-01: 1 via TOPICAL
  Administered 2013-07-02: 10:00:00 via TOPICAL
  Administered 2013-07-02: 1 via TOPICAL
  Administered 2013-07-03: 22:00:00 via TOPICAL
  Administered 2013-07-04: 1 via TOPICAL
  Administered 2013-07-04 – 2013-07-07 (×6): via TOPICAL
  Administered 2013-07-07: 1 via TOPICAL
  Administered 2013-07-08 – 2013-07-10 (×6): via TOPICAL
  Administered 2013-07-11: 1 via TOPICAL
  Administered 2013-07-11 – 2013-07-14 (×5): via TOPICAL
  Administered 2013-07-14: 1 via TOPICAL
  Administered 2013-07-15 – 2013-07-18 (×7): via TOPICAL
  Filled 2013-07-01 (×3): qty 15

## 2013-07-01 MED ORDER — PROMETHAZINE HCL 25 MG PO TABS
12.5000 mg | ORAL_TABLET | Freq: Four times a day (QID) | ORAL | Status: DC | PRN
  Administered 2013-07-01: 12.5 mg via ORAL
  Filled 2013-07-01: qty 1

## 2013-07-01 MED ORDER — DIAZEPAM 5 MG PO TABS
5.0000 mg | ORAL_TABLET | Freq: Four times a day (QID) | ORAL | Status: DC | PRN
  Administered 2013-07-01 – 2013-07-18 (×11): 5 mg via ORAL
  Filled 2013-07-01 (×11): qty 1

## 2013-07-01 MED ORDER — TRAMADOL HCL 50 MG PO TABS
50.0000 mg | ORAL_TABLET | Freq: Four times a day (QID) | ORAL | Status: DC | PRN
  Administered 2013-07-09 – 2013-07-17 (×5): 50 mg via ORAL
  Filled 2013-07-01 (×5): qty 1

## 2013-07-01 MED ORDER — PREDNISONE 20 MG PO TABS
40.0000 mg | ORAL_TABLET | Freq: Every day | ORAL | Status: DC
  Administered 2013-07-02 – 2013-07-09 (×7): 40 mg via ORAL
  Filled 2013-07-01 (×9): qty 2

## 2013-07-01 MED ORDER — MELOXICAM 15 MG PO TABS
15.0000 mg | ORAL_TABLET | Freq: Every day | ORAL | Status: DC
  Administered 2013-07-01 – 2013-07-05 (×4): 15 mg via ORAL
  Filled 2013-07-01 (×5): qty 1

## 2013-07-01 MED ORDER — KCL IN DEXTROSE-NACL 20-5-0.45 MEQ/L-%-% IV SOLN
INTRAVENOUS | Status: DC
  Administered 2013-07-01 – 2013-07-02 (×2): 100 mL/h via INTRAVENOUS
  Administered 2013-07-02: 17:00:00 via INTRAVENOUS
  Administered 2013-07-03: 100 mL/h via INTRAVENOUS
  Administered 2013-07-04 – 2013-07-18 (×16): via INTRAVENOUS
  Filled 2013-07-01 (×36): qty 1000

## 2013-07-01 MED ORDER — OXYCODONE HCL 5 MG PO TABS
5.0000 mg | ORAL_TABLET | ORAL | Status: DC | PRN
  Administered 2013-07-02 – 2013-07-18 (×16): 5 mg via ORAL
  Filled 2013-07-01 (×18): qty 1

## 2013-07-01 MED ORDER — SODIUM CHLORIDE 0.9 % IV SOLN
3.0000 g | Freq: Four times a day (QID) | INTRAVENOUS | Status: AC
  Administered 2013-07-01 – 2013-07-02 (×3): 3 g via INTRAVENOUS
  Filled 2013-07-01 (×3): qty 3

## 2013-07-01 MED ORDER — ALUM & MAG HYDROXIDE-SIMETH 200-200-20 MG/5ML PO SUSP: 30.0000 mL | Freq: Four times a day (QID) | ORAL | Status: DC | PRN

## 2013-07-01 NOTE — H&P (Signed)
  See Office H&P scanned in.

## 2013-07-02 ENCOUNTER — Other Ambulatory Visit: Payer: Medicare Other

## 2013-07-02 ENCOUNTER — Inpatient Hospital Stay (HOSPITAL_COMMUNITY): Payer: Medicare Other

## 2013-07-02 DIAGNOSIS — E43 Unspecified severe protein-calorie malnutrition: Secondary | ICD-10-CM | POA: Insufficient documentation

## 2013-07-02 MED ORDER — ENSURE COMPLETE PO LIQD
237.0000 mL | ORAL | Status: DC | PRN
  Administered 2013-07-02 (×2): 237 mL via ORAL

## 2013-07-02 MED ORDER — IOHEXOL 300 MG/ML  SOLN
100.0000 mL | Freq: Once | INTRAMUSCULAR | Status: AC | PRN
  Administered 2013-07-02: 100 mL via INTRAVENOUS

## 2013-07-02 NOTE — Progress Notes (Signed)
INITIAL NUTRITION ASSESSMENT  Pt meets criteria for severe MALNUTRITION in the context of chronic illness as evidenced by <75% estimated energy intake with 10.6% weight loss in the past 3 months.  DOCUMENTATION CODES Per approved criteria  -Severe malnutrition in the context of chronic illness   INTERVENTION: - Strawberry Ensure Complete PRN  - Recommend MD discuss enteral nutrition with pt as pt reportedly stated earlier today that he did not want a feeding tube - Will continue to monitor   NUTRITION DIAGNOSIS: Inadequate oral intake related to dysphagia, lethargy as evidenced by wife's report.   Goal: Pt able to safely consume >90% of meals/supplements  Monitor:  Weights, labs, intake, TF initiation  Reason for Assessment: Malnutrition screening tool   80 y.o. male  Admitting Dx: Left skull base mass, neck pain, left hypoglossal and glossopharyngeal palsy, dysphagia, dysarthria   ASSESSMENT: Pt asleep, wife and other family members present at bedside. Wife reports recently pt has been consuming only puree foods and Boost r/t dysphagia from left nasopharynx mass. Typically daily intake includes 1/4 cup of cream of potato soup and 3 Boost/day. Reports pt weighed 141 pounds in August, now at 127 pounds. Wife reports pt has been asleep all day and only had 2 sips of water today. Per chaplain notes, pt stated he does not want a feeding tube, however per MD notes plan is for feeding tube.   Height: Ht Readings from Last 1 Encounters:  07/02/13 5\' 3"  (1.6 m)    Weight: Wt Readings from Last 1 Encounters:  07/02/13 127 lb 10.3 oz (57.9 kg)    Ideal Body Weight: 124 lb   % Ideal Body Weight: 102%  Wt Readings from Last 10 Encounters:  07/02/13 127 lb 10.3 oz (57.9 kg)  06/26/13 124 lb (56.246 kg)  06/26/13 124 lb (56.246 kg)  04/03/13 142 lb 1.6 oz (64.456 kg)  03/24/13 140 lb 12.8 oz (63.866 kg)  03/11/13 144 lb (65.318 kg)  01/31/13 146 lb 12.8 oz (66.588 kg)   05/02/12 155 lb (70.308 kg)    Usual Body Weight: 141 lb per wife  % Usual Body Weight: 90%  BMI:  Body mass index is 22.62 kg/(m^2).  Estimated Nutritional Needs: Kcal: 1750-2050 Protein: 90-110g Fluid: 1.7-2L/day  Skin: Intact   Diet Order: Full Liquid  EDUCATION NEEDS: -No education needs identified at this time   Intake/Output Summary (Last 24 hours) at 07/02/13 1506 Last data filed at 07/02/13 1420  Gross per 24 hour  Intake    950 ml  Output   1000 ml  Net    -50 ml    Last BM: 1/2  Labs:   Recent Labs Lab 07/01/13 1829  NA 134*  K 4.6  CL 96  CO2 25  BUN 35*  CREATININE 1.01  CALCIUM 9.4  MG 2.1  GLUCOSE 91    CBG (last 3)  No results found for this basename: GLUCAP,  in the last 72 hours  Scheduled Meds: . meloxicam  15 mg Oral Daily  . predniSONE  40 mg Oral Daily  . triamcinolone cream   Topical BID    Continuous Infusions: . dextrose 5 % and 0.45 % NaCl with KCl 20 mEq/L 100 mL/hr (07/02/13 4098)    Past Medical History  Diagnosis Date  . Arthritis   . Insomnia     takes Trazodone nightly  . PONV (postoperative nausea and vomiting)   . Hyperlipidemia     was on medication but has been off  for a while  . Dizziness     was taking Meclizine but doesn't take now;found out that HR was 45  . Joint pain   . Joint swelling   . Itchy skin     scalp and uses a cream  . H/O hiatal hernia     takes Omeprazole daily  . Hemorrhoids   . Constipation   . History of colon polyps   . History of colitis     many yrs ago  . Urinary frequency   . History of kidney stones     passed on his own  . Enlarged prostate   . Anxiety     takes Diazepam daily prn  . Depression     takes Trazodone nightly  . Insomnia     takes Trazodone nightly    Past Surgical History  Procedure Laterality Date  . Right knee arthroscopy    . Hernia repair      double  . Hemorrhoidectomy with hemorrhoid banding    . Tonsillectomy    . Bilateral  cataract surgery    . Circumcision    . Colonoscopy    . Esophagogastroduodenoscopy    . Ear cyst excision N/A 04/05/2013    Procedure: CYST REMOVAL;  Surgeon: Melvenia BeamMitchell Gore, MD;  [facility];  Service: ENT;  Laterality: N/A;  . Septoplasty N/A 04/05/2013    Procedure: SEPTOPLASTY;  Surgeon: Melvenia BeamMitchell Gore, MD;  [facility];  Service: ENT;  Laterality: N/A;  . Direct laryngoscopy N/A 06/26/2013    Procedure: DIRECT LARYNGOSCOPY;  Surgeon: Christia Readingwight Bates, MD;  [facility];  Service: ENT;  Laterality: N/A;    Levon HedgerHeather Baron MS, RD, LDN [phone] Pager [phone] After Hours Pager

## 2013-07-02 NOTE — Progress Notes (Signed)
Subjective: Sleeping.  Pain medicine is allowing him to rest.  When awake, pain remains severe.  He was found sitting on the floor in the night but did not appear to injure himself.  He is not taking oral diet.  Objective: Vital signs in last 24 hours: Temp:  [97.9 F (36.6 C)-99.3 F (37.4 C)] 99.3 F (37.4 C) (01/06 0516) Pulse Rate:  [59-82] 82 (01/06 0516) Resp:  [16-18] 16 (01/06 0516) BP: (99-151)/(58-82) 142/82 mmHg (01/06 0516) SpO2:  [97 %-100 %] 97 % (01/06 0516) Weight:  [57.9 kg (127 lb 10.3 oz)] 57.9 kg (127 lb 10.3 oz) (01/06 0516) Last BM Date: 06/28/13  Intake/Output from previous day: 01/05 0701 - 01/06 0700 In: 950 [I.V.:950] Out: 800 [Urine:800] Intake/Output this shift:    General appearance: sleeping, rouses for brief moments.  Lab Results:   Recent Labs  07/01/13 1829  WBC 22.8*  HGB 13.2  HCT 39.1  PLT 417*   BMET  Recent Labs  07/01/13 1829  NA 134*  K 4.6  CL 96  CO2 25  GLUCOSE 91  BUN 35*  CREATININE 1.01  CALCIUM 9.4   PT/INR No results found for this basename: LABPROT, INR,  in the last 72 hours ABG No results found for this basename: PHART, PCO2, PO2, HCO3,  in the last 72 hours  Studies/Results: Mr Laqueta Jean Wo Contrast  07/01/2013   CLINICAL DATA:  Left-sided headache and facial pain. Patient refused contrast administration.  EXAM: MRI HEAD WITHOUT  CONTRAST  TECHNIQUE: Multiplanar, multiecho pulse sequences of the brain and surrounding structures were obtained without intravenous contrast.  COMPARISON:  06/24/2013.  06/21/2013.  FINDINGS: The patient would not allow a complete study.  The brainstem and cerebellum are unremarkable. The cerebral hemispheres show mild age related atrophy with a few old small vessel insults, less than often seen in healthy individuals of this age. No cortical or large vessel territory infarction. No evidence of intra-axial mass lesion, hemorrhage, hydrocephalus or extra-axial fluid collection.  Paranasal sinuses are clear. No pituitary mass.  There is fluid throughout the mastoid air cells on the left. There is bone destruction at the petrous apex on the left due to a posterior nasopharyngeal mass measuring approximately 2.8 cm in diameter. Tumor also invades the clivus.  IMPRESSION: Study limited by patient tolerance.  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 Fluid throughout the mastoid air cells on the left.   Electronically Signed   By: Paulina Fusi M.D.   On: 07/01/2013 20:50    Anti-infectives: Anti-infectives   Start     Dose/Rate Route Frequency Ordered Stop   07/01/13 1800  Ampicillin-Sulbactam (UNASYN) 3 g in sodium chloride 0.9 % 100 mL IVPB     3 g 100 mL/hr over 60 Minutes Intravenous Every 6 hours 07/01/13 1731 07/02/13 1359      Assessment/Plan: Left skull base mass, neck pain, left hypoglossal and glossopharyngeal palsy, dysphagia, dysarthria MRI confirms a 2.8 cm invasive mass in the left skull base in the clivus and petrous apex.  Biopsy last week of this area showed only inflammation and probably was not representative of the mass.  I discussed these results with the wife and granddaughter.  In order to obtain a tissue diagnosis, I will order an image guidance CT with contrast to be used in the OR for image guidance.  Rebiopsy will be performed with frozen section confirmation of representative tissue.  [facility].  The wife wishes to proceed.  A feeding tube will be placed through the nose at that time.  He will be given a liquid diet in the meantime.  LOS: 1 day    Mason Burleigh 07/02/2013

## 2013-07-02 NOTE — Progress Notes (Signed)
Wife reports patient was sitting on the floor at the end of the bed and she helped him up - She reports she thinks he slid to the floor slowly.,no injury apparent ,no redness or bruising present,ROM done  to extremities  - denies pain,denies hitting any part of  body against anything.DR Jenne PaneBates notified.

## 2013-07-02 NOTE — Progress Notes (Signed)
Patient and spouse had wanted to complete Advanced Directive forms but at this point the patient is too sleepy to complete the form. He roused long enough to tell me that he does not want to be resuscitated. He also does not want a feeding tube. He was unable to initial his preferences though.  Wife and granddaughter at bedside. I left the form with the patient in case he is more awake later.

## 2013-07-03 ENCOUNTER — Inpatient Hospital Stay (HOSPITAL_COMMUNITY): Payer: Medicare Other

## 2013-07-03 ENCOUNTER — Encounter (HOSPITAL_COMMUNITY): Payer: Self-pay | Admitting: Certified Registered Nurse Anesthetist

## 2013-07-03 ENCOUNTER — Encounter (HOSPITAL_COMMUNITY)
Admission: AD | Disposition: A | Discharge status: Home Health | Payer: Self-pay | Source: Ambulatory Visit | Attending: Otolaryngology

## 2013-07-03 ENCOUNTER — Inpatient Hospital Stay (HOSPITAL_COMMUNITY): Payer: Medicare Other | Admitting: Certified Registered Nurse Anesthetist

## 2013-07-03 ENCOUNTER — Encounter (HOSPITAL_COMMUNITY): Payer: Medicare Other | Admitting: Certified Registered Nurse Anesthetist

## 2013-07-03 HISTORY — PX: SINUS ENDO W/FUSION: SHX777

## 2013-07-03 LAB — SURGICAL PCR SCREEN
MRSA, PCR: NEGATIVE
Staphylococcus aureus: NEGATIVE

## 2013-07-03 SURGERY — SINUS SURGERY, ENDOSCOPIC, USING COMPUTER-ASSISTED NAVIGATION
Anesthesia: General | Site: Nose

## 2013-07-03 MED ORDER — LIDOCAINE-EPINEPHRINE 1 %-1:100000 IJ SOLN
INTRAMUSCULAR | Status: AC
  Filled 2013-07-03: qty 1

## 2013-07-03 MED ORDER — LIDOCAINE-EPINEPHRINE 1 %-1:100000 IJ SOLN
INTRAMUSCULAR | Status: DC | PRN
  Administered 2013-07-03: 5 mL

## 2013-07-03 MED ORDER — LACTATED RINGERS IV SOLN
INTRAVENOUS | Status: DC | PRN
  Administered 2013-07-03: 12:00:00 via INTRAVENOUS

## 2013-07-03 MED ORDER — TRIAMCINOLONE ACETONIDE 40 MG/ML IJ SUSP
INTRAMUSCULAR | Status: AC
  Filled 2013-07-03: qty 5

## 2013-07-03 MED ORDER — HYDROMORPHONE HCL PF 1 MG/ML IJ SOLN
0.5000 mg | Freq: Once | INTRAMUSCULAR | Status: AC
  Administered 2013-07-03: 0.5 mg via INTRAVENOUS

## 2013-07-03 MED ORDER — OXYMETAZOLINE HCL 0.05 % NA SOLN
NASAL | Status: DC | PRN
  Administered 2013-07-03: 1

## 2013-07-03 MED ORDER — FENTANYL CITRATE 0.05 MG/ML IJ SOLN
INTRAMUSCULAR | Status: DC | PRN
  Administered 2013-07-03: 150 ug via INTRAVENOUS

## 2013-07-03 MED ORDER — SUCCINYLCHOLINE CHLORIDE 20 MG/ML IJ SOLN
INTRAMUSCULAR | Status: DC | PRN
  Administered 2013-07-03: 100 mg via INTRAVENOUS

## 2013-07-03 MED ORDER — EPHEDRINE SULFATE 50 MG/ML IJ SOLN
INTRAMUSCULAR | Status: DC | PRN
  Administered 2013-07-03 (×5): 10 mg via INTRAVENOUS

## 2013-07-03 MED ORDER — LIP MEDEX EX OINT
TOPICAL_OINTMENT | CUTANEOUS | Status: AC
  Filled 2013-07-03: qty 7

## 2013-07-03 MED ORDER — ONDANSETRON HCL 4 MG/2ML IJ SOLN
INTRAMUSCULAR | Status: DC | PRN
  Administered 2013-07-03: 4 mg via INTRAVENOUS

## 2013-07-03 MED ORDER — PROPOFOL 10 MG/ML IV BOLUS
INTRAVENOUS | Status: DC | PRN
  Administered 2013-07-03: 100 mg via INTRAVENOUS

## 2013-07-03 MED ORDER — DEXTROSE 5 % IV SOLN
10.0000 mg | INTRAVENOUS | Status: DC | PRN
  Administered 2013-07-03: 20 ug/min via INTRAVENOUS

## 2013-07-03 MED ORDER — SODIUM CHLORIDE 0.9 % IR SOLN
Status: DC | PRN
  Administered 2013-07-03: 1000 mL

## 2013-07-03 MED ORDER — LACTATED RINGERS IV SOLN
INTRAVENOUS | Status: DC
  Administered 2013-07-03 – 2013-07-04 (×2): via INTRAVENOUS

## 2013-07-03 MED ORDER — OXYMETAZOLINE HCL 0.05 % NA SOLN
NASAL | Status: AC
  Filled 2013-07-03: qty 15

## 2013-07-03 MED ORDER — LIDOCAINE HCL (CARDIAC) 20 MG/ML IV SOLN
INTRAVENOUS | Status: DC | PRN
  Administered 2013-07-03: 100 mg via INTRAVENOUS

## 2013-07-03 MED ORDER — MUPIROCIN CALCIUM 2 % EX CREA
TOPICAL_CREAM | CUTANEOUS | Status: AC
  Filled 2013-07-03: qty 15

## 2013-07-03 MED ORDER — HYDROMORPHONE HCL PF 1 MG/ML IJ SOLN
INTRAMUSCULAR | Status: AC
  Filled 2013-07-03: qty 1

## 2013-07-03 SURGICAL SUPPLY — 43 items
ATTRACTOMAT 16X20 MAGNETIC DRP (DRAPES) IMPLANT
BLADE ROTATE RAD 40 4 M4 (BLADE) ×2 IMPLANT
BLADE ROTATE RAD 40 4MM M4 (BLADE) ×1
BLADE ROTATE TRICUT 4MX13CM M4 (BLADE) ×1
BLADE ROTATE TRICUT 4X13 M4 (BLADE) ×2 IMPLANT
CANISTER SUCTION 2500CC (MISCELLANEOUS) ×6 IMPLANT
CLOTH BEACON ORANGE TIMEOUT ST (SAFETY) IMPLANT
COAGULATOR SUCT 6 FR SWTCH (ELECTROSURGICAL)
COAGULATOR SUCT SWTCH 10FR 6 (ELECTROSURGICAL) IMPLANT
CONT SPEC 4OZ CLIKSEAL STRL BL (MISCELLANEOUS) ×6 IMPLANT
DECANTER SPIKE VIAL GLASS SM (MISCELLANEOUS) ×3 IMPLANT
DRESSING NASAL KENNEDY 3.5X.9 (MISCELLANEOUS) IMPLANT
DRSG NASAL KENNEDY 3.5X.9 (MISCELLANEOUS)
ELECT REM PT RETURN 9FT ADLT (ELECTROSURGICAL) ×3
ELECTRODE REM PT RTRN 9FT ADLT (ELECTROSURGICAL) ×1 IMPLANT
FILTER ARTHROSCOPY CONVERTOR (FILTER) ×3 IMPLANT
GLOVE BIO SURGEON STRL SZ7.5 (GLOVE) ×6 IMPLANT
GOWN STRL NON-REIN LRG LVL3 (GOWN DISPOSABLE) ×9 IMPLANT
KIT BASIN OR (CUSTOM PROCEDURE TRAY) ×3 IMPLANT
KIT ROOM TURNOVER OR (KITS) ×3 IMPLANT
NEEDLE 18GX1X1/2 (RX/OR ONLY) (NEEDLE) IMPLANT
NS IRRIG 1000ML POUR BTL (IV SOLUTION) ×3 IMPLANT
PAD ARMBOARD 7.5X6 YLW CONV (MISCELLANEOUS) ×6 IMPLANT
PAD ENT ADHESIVE 25PK (MISCELLANEOUS) ×3 IMPLANT
PENCIL BUTTON HOLSTER BLD 10FT (ELECTRODE) IMPLANT
SHEATH ENDOSCRUB 0 DEG (SHEATH) ×3 IMPLANT
SOLUTION ANTI FOG 6CC (MISCELLANEOUS) ×3 IMPLANT
SPECIMEN JAR SMALL (MISCELLANEOUS) ×9 IMPLANT
SUT ETHILON 3 0 FSL (SUTURE) IMPLANT
SWAB COLLECTION DEVICE MRSA (MISCELLANEOUS) ×3 IMPLANT
TOWEL OR 17X24 6PK STRL BLUE (TOWEL DISPOSABLE) ×3 IMPLANT
TOWEL OR 17X26 10 PK STRL BLUE (TOWEL DISPOSABLE) ×3 IMPLANT
TRACKER ENT INSTRUMENT (MISCELLANEOUS) ×3 IMPLANT
TRACKER ENT PATIENT (MISCELLANEOUS) ×3 IMPLANT
TRAY ENT MC OR (CUSTOM PROCEDURE TRAY) ×3 IMPLANT
TUBE ANAEROBIC SPECIMEN COL (MISCELLANEOUS) ×3 IMPLANT
TUBE CONNECTING 12'X1/4 (SUCTIONS) ×2
TUBE CONNECTING 12X1/4 (SUCTIONS) ×4 IMPLANT
TUBE FEEDING 10FR FLEXIFLO (MISCELLANEOUS) ×3 IMPLANT
TUBING EXTENTION W/L.L. (IV SETS) ×3 IMPLANT
TUBING STRAIGHTSHOT EPS 5PK (TUBING) ×3 IMPLANT
WATER STERILE IRR 1000ML POUR (IV SOLUTION) ×3 IMPLANT
WIPE INSTRUMENT VISIWIPE 73X73 (MISCELLANEOUS) ×3 IMPLANT

## 2013-07-03 NOTE — Progress Notes (Signed)
[facility] arrive to [facility] for procedure scheduled with Dr. Jenne PaneBates. Patient calm on stretcher with eyes closed. Not c/o pain to lt ear. Wife to accompany patient with [facility]. Other family members present. Expecting patient to return after procedure.

## 2013-07-03 NOTE — Anesthesia Procedure Notes (Signed)
Procedure Name: Intubation Date/Time: 07/03/2013 2:06 PM Performed by: Vita BarleyURNER, Kyrin Garn E Pre-anesthesia Checklist: Patient identified, Emergency Drugs available, Suction available and Patient being monitored Patient Re-evaluated:Patient Re-evaluated prior to inductionOxygen Delivery Method: Circle system utilized Preoxygenation: Pre-oxygenation with 100% oxygen Intubation Type: IV induction Ventilation: Mask ventilation without difficulty Grade View: Grade I Tube type: Oral Tube size: 7.5 mm Number of attempts: 1 Airway Equipment and Method: Stylet and Video-laryngoscopy (elective glidescope due to limited mouth opening and pain in neck) Placement Confirmation: ETT inserted through vocal cords under direct vision,  positive ETCO2 and breath sounds checked- equal and bilateral Secured at: 23 cm Tube secured with: Tape Dental Injury: Teeth and Oropharynx as per pre-operative assessment

## 2013-07-03 NOTE — Progress Notes (Signed)
Chaplain responded to request for Advance Directive.  Chaplain reviewed AD with pt and made arrangements for AD to be completed. AD was completed-copies were given by notary to pt's wife with instructions to make sure one was placed with pt's chart/medical staff.  Chaplain offered support through his caring presence, thoughtful conversation and empathic listening.     07/03/13 1500  Clinical Encounter Type  Visited With Patient and family together  Visit Type Initial;Spiritual support;Pre-op;Other (Comment)  Referral From (Chaplain responded to request for Advance Directive)  Spiritual Encounters  Spiritual Needs Prayer;Emotional  Stress Factors  Patient Stress Factors Health changes  Family Stress Factors Health changes;Exhausted    Rulon Abideavid B Sherrod, chaplain pager [phone]

## 2013-07-03 NOTE — Transfer of Care (Signed)
Immediate Anesthesia Transfer of Care Note  Patient: Jason [name]  Procedure(s) Performed: Procedure(s): ENDOSCOPIC SINUS SURGERY WITH FUSION NAVIGATION with By-Nasopharongeal Biopsy (N/A)  Patient Location: PACU  Anesthesia Type:General  Level of Consciousness: awake, alert  and patient cooperative  Airway & Oxygen Therapy: Patient Spontanous Breathing and Patient connected to face mask oxygen  Post-op Assessment: Report given to PACU RN and Post -op Vital signs reviewed and stable  Post vital signs: Reviewed and stable  Complications: No apparent anesthesia complications

## 2013-07-03 NOTE — Anesthesia Preprocedure Evaluation (Addendum)
Anesthesia Evaluation  Patient identified by MRN, date of birth, ID band Patient awake    Reviewed: Allergy & Precautions, H&P , NPO status , Patient's Chart, lab work & pertinent test results  History of Anesthesia Complications (+) PONV and history of anesthetic complications  Airway Mallampati: II  Neck ROM: Limited  Mouth opening: Limited Mouth Opening  Dental  (+) Dental Advisory Given and Chipped,    Pulmonary former smoker,          Cardiovascular     Neuro/Psych PSYCHIATRIC DISORDERS Anxiety Depression    GI/Hepatic hiatal hernia, GERD-  Medicated,  Endo/Other    Renal/GU      Musculoskeletal  (+) Arthritis -,   Abdominal   Peds  Hematology   Anesthesia Other Findings   Reproductive/Obstetrics                          Anesthesia Physical Anesthesia Plan  ASA: III  Anesthesia Plan: General   Post-op Pain Management:    Induction: Intravenous  Airway Management Planned: Oral ETT  Additional Equipment:   Intra-op Plan:   Post-operative Plan: Extubation in OR  Informed Consent: I have reviewed the patients History and Physical, chart, labs and discussed the procedure including the risks, benefits and alternatives for the proposed anesthesia with the patient or authorized representative who has indicated his/her understanding and acceptance.   Dental advisory given  Plan Discussed with: CRNA, Anesthesiologist and Surgeon  Anesthesia Plan Comments:         Anesthesia Quick Evaluation

## 2013-07-03 NOTE — Preoperative (Signed)
Beta Blockers   Reason not to administer Beta Blockers:Not Applicable 

## 2013-07-03 NOTE — Anesthesia Postprocedure Evaluation (Signed)
Anesthesia Post Note  Patient: Jason [name]  Procedure(s) Performed: Procedure(s) (LRB): ENDOSCOPIC SINUS SURGERY WITH FUSION NAVIGATION with By-Nasopharongeal Biopsy (N/A)  Anesthesia type: General  Patient location: PACU  Post pain: Pain level controlled and Adequate analgesia  Post assessment: Post-op Vital signs reviewed, Patient's Cardiovascular Status Stable, Respiratory Function Stable, Patent Airway and Pain level controlled  Last Vitals:  Filed Vitals:   07/03/13 1745  BP: 131/57  Pulse: 93  Temp:   Resp: 20    Post vital signs: Reviewed and stable  Level of consciousness: awake, alert  and oriented  Complications: No apparent anesthesia complications

## 2013-07-03 NOTE — Progress Notes (Signed)
Feeling better today with pain under control and able to interact. AFVSS Sleepy but interactive. Left tongue and soft palate paralysis A/P:Left skull base mass, left hypoglossal and glossopharyngeal palsy, dysphagia, dysphonia, malnutrition, headache, left serous otitis media I reviewed his sinus CT showing the invasive skull base mass to be surrounding the internal carotid artery.  I discussed this finding with the wife and patient highlighting the risks of life-ending hemorrhage or massive stroke as a result of the biopsy attempt.  I feel that the risk is worth taking in order to arrive at an answer and open up treatment options.  Life-ending hemorrhage may be a natural result of tumor erosion anyway.  The wife understands and agrees to proceed.  A feeding tube will be placed at the time of surgery.

## 2013-07-03 NOTE — Brief Op Note (Signed)
07/01/2013 - 07/03/2013  4:02 PM  PATIENT:  Jason RosierBrice A Shaft  80 y.o. male  PRE-OPERATIVE DIAGNOSIS:  Left Nasopharynx/skull base Mass  POST-OPERATIVE DIAGNOSIS:  Left Nasopharynx/skull base mass  PROCEDURE:  ENDOSCOPIC LEFT NASOPHARYNX BIOPSY WITH FUSION IMAGE GUIDANCE  SURGEON:  Surgeon(s) and Role:    * Christia Readingwight Camil Hausmann, MD - Primary  PHYSICIAN ASSISTANT:   ASSISTANTS: none   ANESTHESIA:   general  EBL:  Total I/O In: 400 [I.V.:400] Out: 600 [Urine:600]  BLOOD ADMINISTERED:none  DRAINS: none   LOCAL MEDICATIONS USED:  LIDOCAINE   SPECIMEN:  Source of Specimen:  Left nasopharyx/skull base mass  DISPOSITION OF SPECIMEN:  PATHOLOGY  COUNTS:  YES  TOURNIQUET:  * No tourniquets in log *  DICTATION: .Other Dictation: Dictation Number [id number]  PLAN OF CARE: Return to hospital room  PATIENT DISPOSITION:  PACU - hemodynamically stable.   Delay start of Pharmacological VTE agent (>24hrs) due to surgical blood loss or risk of bleeding: yes

## 2013-07-04 LAB — GLUCOSE, CAPILLARY
GLUCOSE-CAPILLARY: 120 mg/dL — AB (ref 70–99)
GLUCOSE-CAPILLARY: 227 mg/dL — AB (ref 70–99)
GLUCOSE-CAPILLARY: 141 mg/dL — AB (ref 70–99)
Glucose-Capillary: 197 mg/dL — ABNORMAL HIGH (ref 70–99)
Glucose-Capillary: 150 mg/dL — ABNORMAL HIGH (ref 70–99)

## 2013-07-04 LAB — BASIC METABOLIC PANEL
BUN: 17 mg/dL (ref 6–23)
CHLORIDE: 100 meq/L (ref 96–112)
CO2: 27 mEq/L (ref 19–32)
Calcium: 8.5 mg/dL (ref 8.4–10.5)
Creatinine, Ser: 0.94 mg/dL (ref 0.50–1.35)
GFR calc non Af Amer: 76 mL/min — ABNORMAL LOW (ref >90)
GFR, EST AFRICAN AMERICAN: 88 mL/min — AB (ref >90)
Glucose, Bld: 145 mg/dL — ABNORMAL HIGH (ref 70–99)
POTASSIUM: 4.6 meq/L (ref 3.7–5.3)
Sodium: 137 mEq/L (ref 137–147)

## 2013-07-04 LAB — CBC WITH DIFFERENTIAL/PLATELET
BASOS ABS: 0 10*3/uL (ref 0.0–0.1)
Basophils Relative: 0 % (ref 0–1)
EOS ABS: 0 10*3/uL (ref 0.0–0.7)
Eosinophils Relative: 0 % (ref 0–5)
HCT: 34.1 % — ABNORMAL LOW (ref 39.0–52.0)
Hemoglobin: 11.5 g/dL — ABNORMAL LOW (ref 13.0–17.0)
Lymphocytes Relative: 5 % — ABNORMAL LOW (ref 12–46)
Lymphs Abs: 0.5 10*3/uL — ABNORMAL LOW (ref 0.7–4.0)
MCH: 29 pg (ref 26.0–34.0)
MCHC: 33.7 g/dL (ref 30.0–36.0)
MCV: 85.9 fL (ref 78.0–100.0)
Monocytes Absolute: 0.2 10*3/uL (ref 0.1–1.0)
Monocytes Relative: 2 % — ABNORMAL LOW (ref 3–12)
NEUTROS ABS: 10.2 10*3/uL — AB (ref 1.7–7.7)
NEUTROS PCT: 93 % — AB (ref 43–77)
Platelets: 308 10*3/uL (ref 150–400)
RBC: 3.97 MIL/uL — ABNORMAL LOW (ref 4.22–5.81)
RDW: 14 % (ref 11.5–15.5)
WBC: 10.9 10*3/uL — ABNORMAL HIGH (ref 4.0–10.5)

## 2013-07-04 MED ORDER — JEVITY 1.2 CAL PO LIQD
1000.0000 mL | ORAL | Status: DC
  Administered 2013-07-04 – 2013-07-08 (×5): 1000 mL
  Filled 2013-07-04 (×2): qty 1000
  Filled 2013-07-04: qty 237
  Filled 2013-07-04 (×9): qty 1000

## 2013-07-04 MED ORDER — SODIUM CHLORIDE 0.9 % IV BOLUS (SEPSIS)
500.0000 mL | Freq: Once | INTRAVENOUS | Status: AC
  Administered 2013-07-04: 500 mL via INTRAVENOUS

## 2013-07-04 MED ORDER — CIPROFLOXACIN-DEXAMETHASONE 0.3-0.1 % OT SUSP
4.0000 [drp] | Freq: Two times a day (BID) | OTIC | Status: DC
  Administered 2013-07-04 – 2013-07-11 (×15): 4 [drp] via OTIC
  Filled 2013-07-04: qty 7.5

## 2013-07-04 MED ORDER — INSULIN ASPART 100 UNIT/ML ~~LOC~~ SOLN
0.0000 [IU] | Freq: Three times a day (TID) | SUBCUTANEOUS | Status: DC
  Administered 2013-07-04: 3 [IU] via SUBCUTANEOUS
  Administered 2013-07-05: 1 [IU] via SUBCUTANEOUS
  Administered 2013-07-05 (×2): 2 [IU] via SUBCUTANEOUS
  Administered 2013-07-06 – 2013-07-08 (×4): 1 [IU] via SUBCUTANEOUS
  Administered 2013-07-09: 2 [IU] via SUBCUTANEOUS
  Administered 2013-07-10: 1 [IU] via SUBCUTANEOUS
  Administered 2013-07-11: 2 [IU] via SUBCUTANEOUS
  Administered 2013-07-11 – 2013-07-14 (×5): 1 [IU] via SUBCUTANEOUS
  Administered 2013-07-14: 2 [IU] via SUBCUTANEOUS
  Administered 2013-07-16 (×2): 1 [IU] via SUBCUTANEOUS
  Administered 2013-07-16: 2 [IU] via SUBCUTANEOUS
  Administered 2013-07-17: 1 [IU] via SUBCUTANEOUS
  Administered 2013-07-17: 2 [IU] via SUBCUTANEOUS
  Administered 2013-07-18: 1 [IU] via SUBCUTANEOUS

## 2013-07-04 NOTE — Progress Notes (Signed)
1 Day Post-Op  Subjective: Improving.  Less pain today but still hurting.  Taking only a few sips by mouth.  Tube feeds yet to be started.  Urinating remains uncomfortable, urine dark.  Objective: Vital signs in last 24 hours: Temp:  [97.8 F (36.6 C)-98.2 F (36.8 C)] 97.8 F (36.6 C) (01/08 0626) Pulse Rate:  [84-101] 86 (01/08 0626) Resp:  [18-23] 18 (01/08 0626) BP: (95-149)/(47-66) 95/47 mmHg (01/08 0626) SpO2:  [96 %-100 %] 96 % (01/08 0626) Weight:  [62.324 kg (137 lb 6.4 oz)] 62.324 kg (137 lb 6.4 oz) (01/08 1100) Last BM Date: 07/03/13  Intake/Output from previous day: 01/07 0701 - 01/08 0700 In: 2631.7 [P.O.:175; I.V.:2456.7] Out: 1760 [Urine:1700; Blood:60] Intake/Output this shift:    General appearance: alert, cooperative, no distress and most alert he has been since admission with pain controlled.  Left tongue and soft palate weakness.  Cotton in left ear.  Lab Results:   Recent Labs  07/01/13 1829  WBC 22.8*  HGB 13.2  HCT 39.1  PLT 417*   BMET  Recent Labs  07/01/13 1829  NA 134*  K 4.6  CL 96  CO2 25  GLUCOSE 91  BUN 35*  CREATININE 1.01  CALCIUM 9.4   PT/INR No results found for this basename: LABPROT, INR,  in the last 72 hours ABG No results found for this basename: PHART, PCO2, PO2, HCO3,  in the last 72 hours  Studies/Results: Ct Maxillofacial W/cm  07/02/2013   CLINICAL DATA:  Left nasopharyngeal mass.  EXAM: CT MAXILLOFACIAL WITH CONTRAST  TECHNIQUE: Multidetector CT imaging of the maxillofacial structures was performed with intravenous contrast. Multiplanar CT image reconstructions were also generated. A small metallic BB was placed on the right temple in order to reliably differentiate right from left.  CONTRAST:  OMNIPAQUE IOHEXOL 300 MG/ML  SOLN  COMPARISON:  Brain MRI 07/01/2013 and head CT 06/24/2013  FINDINGS: Again seen is abnormal soft tissue involving the left aspect of the nasopharynx with erosion of the clivus and  petrous apex including a portion of the carotid canal. It is difficult to separate the mass from surrounding soft tissues to provide a precise measurement, however the mass measures at least 3 cm in AP dimension and likely encases the upper portion of the cervical left ICA. Contrast is not seen within the anterior portion of the left sigmoid sinus or superior left internal jugular vein. A left mastoid effusion is partially visualized. There is mild mucosal thickening within the left sphenoid sinus. The left maxillary sinus is hypoplastic. The visualized portion of the brain is grossly unremarkable. Prior bilateral cataract surgery is noted.  IMPRESSION: Left-sided nasopharyngeal/central skullbase mass with erosion of the clivus and petrous apex and likely encasement of the cervical left ICA. The adjacent left internal jugular vein may be occluded or severely compressed.   Electronically Signed   By: Sebastian Ache   On: 07/02/2013 20:44   Dg Abd Portable 1v  07/03/2013   CLINICAL DATA:  Feeding tube placement  EXAM: PORTABLE ABDOMEN - 1 VIEW  COMPARISON:  None.  FINDINGS: Tip of the feeding tube is in the fundus of the stomach. Gas-filled loops of small and large bowel are present without disproportionate dilatation.  IMPRESSION: Feeding tube tip is in the fundus of the stomach.   Electronically Signed   By: Maryclare Bean M.D.   On: 07/03/2013 17:10    Anti-infectives: Anti-infectives   Start     Dose/Rate Route Frequency Ordered Stop  07/01/13 1800  Ampicillin-Sulbactam (UNASYN) 3 g in sodium chloride 0.9 % 100 mL IVPB     3 g 100 mL/hr over 60 Minutes Intravenous Every 6 hours 07/01/13 1731 07/02/13 1359      Assessment/Plan: s/p Procedure(s): ENDOSCOPIC SINUS SURGERY WITH FUSION NAVIGATION with By-Nasopharongeal Biopsy (N/A) [facility].  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  Pathology in OR  yesterday showed only inflammatory changes.  Final pathology and culture results are pending but gram stain showed GPCs.  Continue IV Unasyn.  He seems to be improving clinically.  Dietician will see him today to start tube feeds.  Will increase IV fluids in meantime and give him a bolus.  Urine culture is pending.  LOS: 3 days    Jason [name] 07/04/2013

## 2013-07-04 NOTE — Op Note (Signed)
NAMCarlton Adam:  Bently, Matheau               ACCOUNT NO.:  [id number]  MEDICAL RECORD NO.:  [id number]  LOCATION:  6N19C                        FACILITY:  [facility]  PHYSICIAN:  Antony Contraswight D Rima Blizzard, MD     DATE OF BIRTH:  03/10/1930  DATE OF PROCEDURE:  07/03/2013 DATE OF DISCHARGE:                              OPERATIVE REPORT   PREOPERATIVE DIAGNOSIS:  Left nasopharynx/skull base mass.  POSTOPERATIVE DIAGNOSIS:  Left nasopharynx/skull base mass.  PROCEDURE:  Endoscopic left nasopharynx/skull base biopsy with fusion image guidance.  SURGEON:  Excell SeltzerDwight D. Jenne PaneBates, MD  ANESTHESIA:  General.  COMPLICATIONS:  None.  INDICATION:  The patient is an 80 year old male, who has had several weeks of headache and more recently separate from a left hypoglossal and left glossopharyngeal palsies.  This has resulted in dysarthria and dysphagia.  CT imaging demonstrated fullness of the left nasopharynx and he was taken for biopsy about a week ago, which was done through the nose while looking through the mouth using a mirror.  Biopsies ultimately returned has only granulation tissue and inflammatory changes.  After biopsy, he had a much worsening of his pain to the point that he had to be hospitalized earlier this week for pain control.  He has not been eating well and has been losing weight.  MR imaging better demonstrates a deeper mass eroding the clivus and petrous apex and near the internal carotid artery, where it enters its canal.  He presents back to the operating room today for endoscopic biopsy.  FINDINGS:  The entire left upper nasopharynx was a defect with white fiber/gooey material within it.  The material extended beyond the midline toward the right side slightly and also extended more fully to the left side into the depths of the deep soft tissues.  Much of this material was debrided using the microdebrider and pieces were sent for pathology along with surrounding more normal appearing tissue.   Frozen section results yielded only inflammatory tissue and no neoplasm. Additional tissue was then collected in the same sort of fashion and sent for a 2nd attempt and similar results came back.  Still more tissue was collected using both microdebrider as well as through cut forceps and was sent for permanent pathology as well as cultures including bacterial, fungal, and AFB.  The whitish mass was left in place laterally since the carotid artery was nearby.  PROCEDURE IN DETAIL:  The patient was identified in the holding room and informed consent having been obtained, discussion of risks, benefits, alternatives, and the patient was brought to the operative suite, and put on the operating table in supine position.  Anesthesia was induced. The patient was intubated by anesthesia team without difficulty.  The patient had been receiving intravenous antibiotics previously.  His eyes were taped closed and the face was prepped and draped in sterile fashion.  This was done after placing the fusion image guidance, and 10 on the forehead.  The nasal passages were packed with Afrin pledgets and the patient was registered to the fusion system in standard fashion. Pledgets were removed.  A 0-degree telescope was used to evaluate the nasopharynx.  The straight microdebrider was then used to debride whitish  tissue within the defect in the superior the nasopharynx.  Some of the tissue was also removed using through cut forceps.  Further dissection was performed toward the right side removing the whitish tissue from the defect fully to that side.  Further dissection with the microdebrider was then performed toward the left back into the depth of the soft tissue.  Some of these pieces were sent then for frozen section pathology.  A little bit more dissection was performed while waiting for results.  Results returned as noted above.  Some additional tissue was then collected with the straight cutting  forceps both from the white mass as well surrounding inflammatory appearing tissue.  Once again, the results appeared returned as above.  Finally, little bit of extra tissue was gathered using a through cut forceps and sent for the cultures listed above.  At this point, the nose was suctioned.  A feeding tube was passed to the right nasal passage and down the esophagus and was secured at the nose after cleaning the patient off.  He was turned back to anesthesia for wake-up, was extubated, moved to recovery room in stable condition.     Antony Contras, MD     DDB/MEDQ  D:  07/03/2013  T:  07/04/2013  Job:  [id number]

## 2013-07-04 NOTE — Progress Notes (Signed)
NUTRITION FOLLOW UP  Intervention:   Initiate Jevity 1.2 @ 20 ml/hr via NG tube and increase by 10 ml every 4 hours to goal rate of 60 ml/hr.  At goal rate, tube feeding regimen will provide 1728 kcal, 80 grams of protein, and 1166 ml of H2O.  If/when continuous infusions are discontinued pt will need additional water flushes; 130 ml flush of H2O every 6 hours. RD to continue to monitor.   Nutrition Dx:   Inadequate oral intake related to dysphagia, lethargy as evidenced by wife's report; ongoing  Goal:   Pt to meet >/= 90% of their estimated nutrition needs   Monitor:   Weights; no new weights Labs; low sodium, elevated BUN, low GFR PO intake; few bites/sips at mealtimes TF initiation and tolerance; to be started today  Assessment:   Pt underwent endoscopic left nasopharynx biopsy with fusion image guidance 1/7. NG tube was placed during surgery. RD consulted for tube feeding initiation and management.  Pt asleep at time of visit. Per pt's wife pt only had a few sips of orange juice and few teaspoons of ice cream for breakfast. Will attempt to meet 100% of estimated energy and protein needs through feeding tube until PO intake improves.   Height: Ht Readings from Last 1 Encounters:  07/02/13 5\' 3"  (1.6 m)    Weight Status:   Wt Readings from Last 1 Encounters:  07/02/13 127 lb 10.3 oz (57.9 kg)    Re-estimated needs:  Kcal: 1550-1770 Protein: 80-92 grams  Fluid: 1.7 L/day  Skin: incision on nose  Diet Order: Full Liquid   Intake/Output Summary (Last 24 hours) at 07/04/13 1037 Last data filed at 07/04/13 0626  Gross per 24 hour  Intake 2231.67 ml  Output   1560 ml  Net 671.67 ml    Last BM: 1/7   Labs:   Recent Labs Lab 07/01/13 1829  NA 134*  K 4.6  CL 96  CO2 25  BUN 35*  CREATININE 1.01  CALCIUM 9.4  MG 2.1  GLUCOSE 91    CBG (last 3)  No results found for this basename: GLUCAP,  in the last 72 hours  Scheduled Meds: . meloxicam  15 mg  Oral Daily  . predniSONE  40 mg Oral Daily  . triamcinolone cream   Topical BID    Continuous Infusions: . dextrose 5 % and 0.45 % NaCl with KCl 20 mEq/L 100 mL/hr at 07/03/13 1000  . lactated ringers 50 mL/hr at 07/04/13 0552    Ian Malkineanne Barnett RD, LDN Inpatient Clinical Dietitian Pager: [phone] After Hours Pager: [phone]

## 2013-07-05 ENCOUNTER — Ambulatory Visit (INDEPENDENT_AMBULATORY_CARE_PROVIDER_SITE_OTHER): Payer: Medicare Other | Admitting: Surgery

## 2013-07-05 ENCOUNTER — Inpatient Hospital Stay (HOSPITAL_COMMUNITY): Payer: Medicare Other

## 2013-07-05 LAB — GLUCOSE, CAPILLARY
GLUCOSE-CAPILLARY: 133 mg/dL — AB (ref 70–99)
GLUCOSE-CAPILLARY: 193 mg/dL — AB (ref 70–99)
Glucose-Capillary: 153 mg/dL — ABNORMAL HIGH (ref 70–99)
Glucose-Capillary: 193 mg/dL — ABNORMAL HIGH (ref 70–99)

## 2013-07-05 LAB — URINE CULTURE
COLONY COUNT: NO GROWTH
Culture: NO GROWTH
SPECIAL REQUESTS: NORMAL

## 2013-07-05 MED ORDER — PANTOPRAZOLE SODIUM 40 MG IV SOLR
40.0000 mg | INTRAVENOUS | Status: DC
  Administered 2013-07-05 – 2013-07-06 (×2): 40 mg via INTRAVENOUS
  Filled 2013-07-05 (×3): qty 40

## 2013-07-05 MED ORDER — CIPROFLOXACIN IN D5W 400 MG/200ML IV SOLN
400.0000 mg | Freq: Two times a day (BID) | INTRAVENOUS | Status: DC
  Administered 2013-07-05 – 2013-07-08 (×6): 400 mg via INTRAVENOUS
  Filled 2013-07-05 (×9): qty 200

## 2013-07-05 NOTE — Evaluation (Signed)
Physical Therapy Evaluation Patient Details Name: Jason [name] MRN: [id number] DOB: 06/30/1929 Today's Date: 07/05/2013 Time: [id number] PT Time Calculation (min): 54 min  PT Assessment / Plan / Recommendation History of Present Illness  80 year old male with three weeks or more of left-sided headaches.  Early this week, tongue movement became abnormal and he went to the ER.  A head CT demonstrated fullness in the left nasopharynx which corresponded to an irregular fullness seen on endoscopy subsequently at the office.  He presents for biopsy.  Clinical Impression  Pt presents very deconditioned with decreased mobility, decreased balance and also noted some R inattention when negotiating objects.  Unsure if this is related to mass in skull or not.  Tolerated ambulation in hallway with RW at min assist, however has very slow gait speed and requires several standing rest breaks.  Note at end of session, this PT discussing follow up therapy at D/C with pt and he became somewhat tearful and states "I'd rather be dead."  Attempted to continue discussion with pt, however he became very quiet.  Discussed issues with depression with wife, who discussed many issues with pts personal life leading up to this recent hospital stay and issues with new brain masses.  Feel pt would greatly benefit from psych services in acute care, as well as continued PT/OT and perhaps speech therapy.  PT recommends CIR to regain pts mobility, strength and balance.  Discussed this with wife who is agreeable to consult.      PT Assessment  Patient needs continued PT services    Follow Up Recommendations  CIR    Does the patient have the potential to tolerate intense rehabilitation      Barriers to Discharge        Equipment Recommendations  None recommended by PT    Recommendations for Other Services OT consult;Other (comment);Rehab consult;Speech consult (highly recommend psych services)   Frequency Min 4X/week     Precautions / Restrictions Precautions Precautions: Fall Precaution Comments: very deconditioned Restrictions Weight Bearing Restrictions: No   Pertinent Vitals/Pain Pt with pain in front and left side of head.  RN notified.       Mobility  Bed Mobility Overal bed mobility: Needs Assistance Bed Mobility: Supine to Sit Supine to sit: Min guard;HOB elevated General bed mobility comments: Requires min/guard for safety of trunk when sitting up, however he was able to get LEs out of bed and elevate trunk mostly with use of handrails to self assist.  Transfers Overall transfer level: Needs assistance Equipment used: Rolling walker (2 wheeled) Transfers: Sit to/from Stand Sit to Stand: Min assist General transfer comment: Min assist for increased forward weight shift and to steady for safety with cues for hand placement and controlled descent.  Ambulation/Gait Ambulation/Gait assistance: Min assist Ambulation Distance (Feet): 120 Feet (with intermittent standing rest breaks. ) Assistive device: Rolling walker (2 wheeled) Gait Pattern/deviations: Step-through pattern;Decreased stride length;Trunk flexed;Narrow base of support General Gait Details: Pt demonstrates very decreased activity tolerance during PT session and requires several standing rest breaks due to fatigue.  Also note he has very slow gait speed, placing pt at high risk for falls. Provided cues for maintaining positioning of RW throughout and note pt tends to bump into objects on the R when negotiating around objects in room.     Exercises     PT Diagnosis: Difficulty walking;Generalized weakness;Acute pain  PT Problem List: Decreased strength;Decreased activity tolerance;Decreased balance;Decreased mobility;Decreased cognition;Decreased knowledge of use of DME;Decreased safety  awareness;Decreased knowledge of precautions;Pain PT Treatment Interventions: DME instruction;Gait training;Functional mobility training;Therapeutic  activities;Therapeutic exercise;Stair training;Balance training;Neuromuscular re-education;Cognitive remediation;Patient/family education     PT Goals(Current goals can be found in the care plan section) Acute Rehab PT Goals Patient Stated Goal: none stated PT Goal Formulation: With patient/family Time For Goal Achievement: 07/12/13 Potential to Achieve Goals: Good  Visit Information  Last PT Received On: 07/05/13 Assistance Needed: +1 History of Present Illness: 80 year old male with three weeks or more of left-sided headaches.  Early this week, tongue movement became abnormal and he went to the ER.  A head CT demonstrated fullness in the left nasopharynx which corresponded to an irregular fullness seen on endoscopy subsequently at the office.  He presents for biopsy.       Prior Functioning  Home Living Family/patient expects to be discharged to:: Private residence Living Arrangements: Spouse/significant other Available Help at Discharge: Available PRN/intermittently (most of the time) Type of Home: House Home Access: Level entry Home Layout: Two level;Able to live on main level with bedroom/bathroom Alternate Level Stairs-Number of Steps: one step out to garage Home Equipment: Crutches;Cane - single point;Walker - 2 wheels Prior Function Level of Independence: Independent Communication Communication: No difficulties    Cognition  Cognition Arousal/Alertness: Lethargic Behavior During Therapy: WFL for tasks assessed/performed Overall Cognitive Status: Within Functional Limits for tasks assessed (note delayed processing when given verbal commands)    Extremity/Trunk Assessment Lower Extremity Assessment Lower Extremity Assessment: Generalized weakness Cervical / Trunk Assessment Cervical / Trunk Assessment: Normal   Balance    End of Session PT - End of Session Equipment Utilized During Treatment: Gait belt Activity Tolerance: Patient limited by fatigue Patient left: in  chair;with call bell/phone within reach;with family/visitor present Nurse Communication: Mobility status  GP     Vista Deck 07/05/2013, 3:23 PM

## 2013-07-05 NOTE — Progress Notes (Signed)
Inpatient Diabetes Program Recommendations  AACE/ADA: New Consensus Statement on Inpatient Glycemic Control (2013)  Target Ranges:  Prepandial:   less than 140 mg/dL      Peak postprandial:   less than 180 mg/dL (1-2 hours)      Critically ill patients:  140 - 180 mg/dL     **Noted patient receiving tube feeds as well as Prednisone.  POD#1.  Inpatient Diabetes Program Recommendations Correction (SSI): Please change Novolog Sensitve SSI to Q4 hours if desired while patient on continuous tube feeds (currently ordered as Sensitive SSI tid with meals)   Will follow. Ambrose FinlandJeannine Johnston Ariya Bohannon RN, MSN, CDE Diabetes Coordinator Inpatient Diabetes Program Team Pager: [phone] (8a-10p)

## 2013-07-05 NOTE — Progress Notes (Signed)
2 Days Post-Op  Subjective: Feeding tube came out last night.  He approves replacement.  Pain comes and goes.  Still not taking much by mouth.  Has not been out of bed much since admission.  Urine still burns somewhat.  Objective: Vital signs in last 24 hours: Temp:  [97.6 F (36.4 C)-98 F (36.7 C)] 97.6 F (36.4 C) (01/09 0500) Pulse Rate:  [70-86] 70 (01/09 0500) Resp:  [20] 20 (01/09 0500) BP: (101-127)/(51-57) 127/54 mmHg (01/09 0500) SpO2:  [97 %-99 %] 97 % (01/09 0500) Weight:  [61.1 kg (134 lb 11.2 oz)] 61.1 kg (134 lb 11.2 oz) (01/09 0500) Last BM Date: 06/29/13  Intake/Output from previous day: 01/08 0701 - 01/09 0700 In: 2870.3 [I.V.:2442; NG/GT:428.3] Out: 2500 [Urine:2500] Intake/Output this shift:    General appearance: alert and Sleepy but rouseable.  Oriented and follows commands.  Speech still with some slurring.  Lab Results:   Recent Labs  07/04/13 1420  WBC 10.9*  HGB 11.5*  HCT 34.1*  PLT 308   BMET  Recent Labs  07/04/13 1420  NA 137  K 4.6  CL 100  CO2 27  GLUCOSE 145*  BUN 17  CREATININE 0.94  CALCIUM 8.5   PT/INR No results found for this basename: LABPROT, INR,  in the last 72 hours ABG No results found for this basename: PHART, PCO2, PO2, HCO3,  in the last 72 hours  Studies/Results: Dg Abd Portable 1v  07/03/2013   CLINICAL DATA:  Feeding tube placement  EXAM: PORTABLE ABDOMEN - 1 VIEW  COMPARISON:  None.  FINDINGS: Tip of the feeding tube is in the fundus of the stomach. Gas-filled loops of small and large bowel are present without disproportionate dilatation.  IMPRESSION: Feeding tube tip is in the fundus of the stomach.   Electronically Signed   By: Maryclare BeanArt  Hoss M.D.   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  Culture from nasopharynx is growing GNRs.  Will add Cipro.  Continue Unasyn.  I replaced the Panda tube at the bedside without difficulty.  Will check KUB and then reinitiate feedings.  Continue Ciprodex to left ear.  Urine culture is negative.  Will consult PT/OT to start mobilizing him.  LOS: 4 days    Letisha Yera 07/05/2013

## 2013-07-05 NOTE — Progress Notes (Signed)
Called MD on-call for ENT about [name] pulling out Panda tube. No order were giving at time. Will let on coming MD know about a need for a order to have Panda tube to be replaced during there rounds.

## 2013-07-05 NOTE — Progress Notes (Signed)
Rehab Admissions Coordinator Note:  Patient was screened by Trish MageLogue, Baili Stang M for appropriateness for an Inpatient Acute Rehab Consult.  At this time, we are recommending Skilled Nursing Facility or [facility].  Patient is already doing too well to justify an acute inpatient rehab stay.  Additionally, doubt that [facility] could [facility] given [facility].  Call me for questions.  Trish MageLogue, Perry Brucato M 07/05/2013, 5:26 PM  I can be reached at [phone].

## 2013-07-06 LAB — URINALYSIS, ROUTINE W REFLEX MICROSCOPIC
Bilirubin Urine: NEGATIVE
GLUCOSE, UA: NEGATIVE mg/dL
Hgb urine dipstick: NEGATIVE
Ketones, ur: NEGATIVE mg/dL
Nitrite: NEGATIVE
PH: 5.5 (ref 5.0–8.0)
Protein, ur: NEGATIVE mg/dL
Specific Gravity, Urine: 1.012 (ref 1.005–1.030)
Urobilinogen, UA: 1 mg/dL (ref 0.0–1.0)

## 2013-07-06 LAB — GLUCOSE, CAPILLARY
GLUCOSE-CAPILLARY: 133 mg/dL — AB (ref 70–99)
GLUCOSE-CAPILLARY: 138 mg/dL — AB (ref 70–99)
Glucose-Capillary: 110 mg/dL — ABNORMAL HIGH (ref 70–99)
Glucose-Capillary: 140 mg/dL — ABNORMAL HIGH (ref 70–99)

## 2013-07-06 LAB — URINE MICROSCOPIC-ADD ON

## 2013-07-06 MED ORDER — DOCUSATE SODIUM 100 MG PO CAPS
100.0000 mg | ORAL_CAPSULE | Freq: Two times a day (BID) | ORAL | Status: DC
  Administered 2013-07-06 – 2013-07-18 (×24): 100 mg via ORAL
  Filled 2013-07-06 (×25): qty 1

## 2013-07-06 MED ORDER — BISACODYL 10 MG RE SUPP
10.0000 mg | Freq: Every day | RECTAL | Status: DC | PRN
  Administered 2013-07-07 – 2013-07-16 (×3): 10 mg via RECTAL
  Filled 2013-07-06 (×3): qty 1

## 2013-07-06 NOTE — Progress Notes (Signed)
3 Days Post-Op  Subjective: Pain better controlled.  Worked with PT yesterday.  Tolerating tube feeds.  No new complaints.  Urine still burning.  Objective: Vital signs in last 24 hours: Temp:  [97.6 F (36.4 C)-98.2 F (36.8 C)] 98.2 F (36.8 C) (01/10 0547) Pulse Rate:  [70-80] 80 (01/10 0547) Resp:  [18-20] 20 (01/10 0547) BP: (105-113)/(48-59) 113/51 mmHg (01/10 0547) SpO2:  [9 %-98 %] 9 % (01/10 0547) Weight:  [61.372 kg (135 lb 4.8 oz)] 61.372 kg (135 lb 4.8 oz) (01/10 0547) Last BM Date: 06/29/13  Intake/Output from previous day: 01/09 0701 - 01/10 0700 In: 300 [P.O.:300] Out: 2650 [Urine:2650] Intake/Output this shift:    General appearance: alert, cooperative, no distress and oriented.  Feeding tube in nose.  Lab Results:   Recent Labs  07/04/13 1420  WBC 10.9*  HGB 11.5*  HCT 34.1*  PLT 308   BMET  Recent Labs  07/04/13 1420  NA 137  K 4.6  CL 100  CO2 27  GLUCOSE 145*  BUN 17  CREATININE 0.94  CALCIUM 8.5   PT/INR No results found for this basename: LABPROT, INR,  in the last 72 hours ABG No results found for this basename: PHART, PCO2, PO2, HCO3,  in the last 72 hours  Studies/Results: Dg Abd Portable 1v  07/05/2013   CLINICAL DATA:  Verify tube placement.  EXAM: PORTABLE ABDOMEN - 1 VIEW  COMPARISON:  07/03/2013  FINDINGS: An enteric tube has been advanced slightly and has tip over the mid stomach in the left mid to upper abdomen. There are several air-filled nondilated loops of large and small bowel. Remainder the exam is unchanged.  IMPRESSION: Nonspecific, nonobstructive bowel gas pattern.  Enteric tube with tip over the mid stomach in the left mid to upper abdomen.   Electronically Signed   By: Elberta Fortisaniel  Boyle M.D.   On: 07/05/2013 13:43    Anti-infectives: Anti-infectives   Start     Dose/Rate Route Frequency Ordered Stop   07/05/13 1400  ciprofloxacin (CIPRO) IVPB 400 mg     400 mg 200 mL/hr over 60 Minutes Intravenous Every 12 hours  07/05/13 1257     07/01/13 1800  Ampicillin-Sulbactam (UNASYN) 3 g in sodium chloride 0.9 % 100 mL IVPB     3 g 100 mL/hr over 60 Minutes Intravenous Every 6 hours 07/01/13 1731 07/02/13 1359      Assessment/Plan: Skull base osteomyelitis with inflammatory pseudotumor, left hypoglossal and glossopharyngeal palsy, headache, dysphagia, dysphonia s/p Procedure(s): ENDOSCOPIC SINUS SURGERY WITH FUSION NAVIGATION with By-Nasopharongeal Biopsy (N/A) Urine culture negative.  Cipro added yesterday for skull base process, would cover most UTIs.  Skull base culture grew Serratia, susecptible to Cipro.  Gram stain had showed GPCs so will continue Unasyn also.  Continue tube feeds.  Will consult speech pathology Monday.  Appreciate PT input.  OT consult still pending.  I discussed the patient's mood with his wife who indicated his depression had to do with circumstances prior to admission.  Will hold off on psych consult for now.  LOS: 5 days    Jason [name] 07/06/2013

## 2013-07-07 LAB — GLUCOSE, CAPILLARY
Glucose-Capillary: 87 mg/dL (ref 70–99)
Glucose-Capillary: 116 mg/dL — ABNORMAL HIGH (ref 70–99)
Glucose-Capillary: 155 mg/dL — ABNORMAL HIGH (ref 70–99)
Glucose-Capillary: 112 mg/dL — ABNORMAL HIGH (ref 70–99)

## 2013-07-07 LAB — TISSUE CULTURE
Culture: NO GROWTH
Gram Stain: NONE SEEN

## 2013-07-07 MED ORDER — PANTOPRAZOLE SODIUM 40 MG PO PACK
40.0000 mg | PACK | Freq: Every day | ORAL | Status: DC
  Administered 2013-07-07 – 2013-07-09 (×3): 40 mg
  Filled 2013-07-07 (×3): qty 20

## 2013-07-07 NOTE — Progress Notes (Signed)
4 Days Post-Op  Subjective: Pain is much improved but comes and goes.  Frustrated with mucus in throat.  Tolerating tube feeds.  Objective: Vital signs in last 24 hours: Temp:  [97.4 F (36.3 C)-98.3 F (36.8 C)] 98.3 F (36.8 C) (01/11 0539) Pulse Rate:  [61-72] 72 (01/11 0539) Resp:  [18-20] 20 (01/11 0539) BP: (92-134)/(40-62) 92/40 mmHg (01/11 0539) SpO2:  [96 %-98 %] 98 % (01/11 0539) Weight:  [61.417 kg (135 lb 6.4 oz)] 61.417 kg (135 lb 6.4 oz) (01/11 0539) Last BM Date: 06/29/13  Intake/Output from previous day: 01/10 0701 - 01/11 0700 In: 4324.3 [I.V.:1846.7; ZO/XW:9604.5G/GT:1677.7; IV Piggyback:800] Out: 3350 [Urine:3350] Intake/Output this shift: Total I/O In: 240 [P.O.:240] Out: -   General appearance: alert, cooperative and no distress  Lab Results:   Recent Labs  07/04/13 1420  WBC 10.9*  HGB 11.5*  HCT 34.1*  PLT 308   BMET  Recent Labs  07/04/13 1420  NA 137  K 4.6  CL 100  CO2 27  GLUCOSE 145*  BUN 17  CREATININE 0.94  CALCIUM 8.5   PT/INR No results found for this basename: LABPROT, INR,  in the last 72 hours ABG No results found for this basename: PHART, PCO2, PO2, HCO3,  in the last 72 hours  Studies/Results: No results found.  Anti-infectives: Anti-infectives   Start     Dose/Rate Route Frequency Ordered Stop   07/05/13 1400  ciprofloxacin (CIPRO) IVPB 400 mg     400 mg 200 mL/hr over 60 Minutes Intravenous Every 12 hours 07/05/13 1257     07/01/13 1800  Ampicillin-Sulbactam (UNASYN) 3 g in sodium chloride 0.9 % 100 mL IVPB     3 g 100 mL/hr over 60 Minutes Intravenous Every 6 hours 07/01/13 1731 07/02/13 1359      Assessment/Plan: Skull base osteomyelitis with inflammatory pseudotumor, left hypoglossal and glossopharyngeal palsy, dysphagia, dysarthria, headache s/p Procedure(s): ENDOSCOPIC SINUS SURGERY WITH FUSION NAVIGATION with By-Nasopharongeal Biopsy (N/A) Continues to progress well but slowly.  Tolerating tube feeds.   Continue PT.  Waiting for OT evaluation.  Tomorrow, I will consult speech path and ID.  Will likely need rehab after discharge and IV antibiotics for several weeks.  LOS: 6 days    Narvel Kozub 07/07/2013

## 2013-07-08 ENCOUNTER — Encounter (HOSPITAL_COMMUNITY): Payer: Self-pay | Admitting: Otolaryngology

## 2013-07-08 DIAGNOSIS — M869 Osteomyelitis, unspecified: Principal | ICD-10-CM

## 2013-07-08 DIAGNOSIS — H60399 Other infective otitis externa, unspecified ear: Secondary | ICD-10-CM

## 2013-07-08 DIAGNOSIS — R51 Headache: Secondary | ICD-10-CM

## 2013-07-08 LAB — CBC WITH DIFFERENTIAL/PLATELET
BASOS PCT: 0 % (ref 0–1)
Basophils Absolute: 0 10*3/uL (ref 0.0–0.1)
EOS ABS: 0 10*3/uL (ref 0.0–0.7)
Eosinophils Relative: 0 % (ref 0–5)
HCT: 34.2 % — ABNORMAL LOW (ref 39.0–52.0)
HEMOGLOBIN: 11.3 g/dL — AB (ref 13.0–17.0)
LYMPHS ABS: 1.7 10*3/uL (ref 0.7–4.0)
Lymphocytes Relative: 13 % (ref 12–46)
MCH: 28.8 pg (ref 26.0–34.0)
MCHC: 33 g/dL (ref 30.0–36.0)
MCV: 87 fL (ref 78.0–100.0)
MONO ABS: 0.9 10*3/uL (ref 0.1–1.0)
MONOS PCT: 7 % (ref 3–12)
NEUTROS PCT: 80 % — AB (ref 43–77)
Neutro Abs: 10.3 10*3/uL — ABNORMAL HIGH (ref 1.7–7.7)
Platelets: 322 10*3/uL (ref 150–400)
RBC: 3.93 MIL/uL — ABNORMAL LOW (ref 4.22–5.81)
RDW: 13.9 % (ref 11.5–15.5)
WBC: 12.8 10*3/uL — ABNORMAL HIGH (ref 4.0–10.5)

## 2013-07-08 LAB — ANAEROBIC CULTURE: Gram Stain: NONE SEEN

## 2013-07-08 LAB — CULTURE, ROUTINE-ABSCESS

## 2013-07-08 LAB — BASIC METABOLIC PANEL
BUN: 19 mg/dL (ref 6–23)
CO2: 27 meq/L (ref 19–32)
Calcium: 8.3 mg/dL — ABNORMAL LOW (ref 8.4–10.5)
Chloride: 102 mEq/L (ref 96–112)
Creatinine, Ser: 0.77 mg/dL (ref 0.50–1.35)
GFR calc Af Amer: >90 mL/min (ref >90)
GFR, EST NON AFRICAN AMERICAN: 83 mL/min — AB (ref >90)
GLUCOSE: 128 mg/dL — AB (ref 70–99)
POTASSIUM: 4.4 meq/L (ref 3.7–5.3)
Sodium: 139 mEq/L (ref 137–147)

## 2013-07-08 LAB — GLUCOSE, CAPILLARY
GLUCOSE-CAPILLARY: 113 mg/dL — AB (ref 70–99)
Glucose-Capillary: 123 mg/dL — ABNORMAL HIGH (ref 70–99)
Glucose-Capillary: 143 mg/dL — ABNORMAL HIGH (ref 70–99)
Glucose-Capillary: 142 mg/dL — ABNORMAL HIGH (ref 70–99)

## 2013-07-08 MED ORDER — DEXTROSE 5 % IV SOLN
2.0000 g | INTRAVENOUS | Status: DC
  Administered 2013-07-08 – 2013-07-12 (×5): 2 g via INTRAVENOUS
  Filled 2013-07-08 (×5): qty 2

## 2013-07-08 MED ORDER — SODIUM CHLORIDE 0.9 % IJ SOLN
10.0000 mL | INTRAMUSCULAR | Status: DC | PRN
  Administered 2013-07-08: 10 mL
  Administered 2013-07-17: 20 mL

## 2013-07-08 NOTE — Progress Notes (Signed)
ANTIBIOTIC CONSULT NOTE - INITIAL  Pharmacy Consult for cefepime Indication: skull osteomyelitis, cultures positive for serratia  Allergies  Allergen Reactions  . Bee Pollen Anaphylaxis  . Tetanus Toxoids Swelling    Tetanus Shot  . Hydrocodone Other (See Comments)    Confusion, "talking out of his head"    Patient Measurements: Height: 5\' 3"  (160 cm) Weight: 135 lb 9.3 oz (61.5 kg) IBW/kg (Calculated) : 56.9   Vital Signs: Temp: 97.8 F (36.6 C) (01/12 0618) Temp src: Oral (01/12 0618) BP: 117/58 mmHg (01/12 0618) Pulse Rate: 66 (01/12 0618) Intake/Output from previous day: 01/11 0701 - 01/12 0700 In: 5395 [P.O.:660; I.V.:3015; NG/GT:1320; IV Piggyback:400] Out: 2550 [Urine:2550] Intake/Output from this shift:    Labs: No results found for this basename: WBC, HGB, PLT, LABCREA, CREATININE,  in the last 72 hours Estimated Creatinine Clearance: 49.6 ml/min (by C-G formula based on Cr of 0.94). No results found for this basename: VANCOTROUGH, Leodis Binet, VANCORANDOM, GENTTROUGH, GENTPEAK, GENTRANDOM, TOBRATROUGH, TOBRAPEAK, TOBRARND, AMIKACINPEAK, AMIKACINTROU, AMIKACIN,  in the last 72 hours   Microbiology: Recent Results (from the past 720 hour(s))  SURGICAL PCR SCREEN     Status: None   Collection Time    07/03/13  6:06 AM      Result Value Range Status   MRSA, PCR NEGATIVE  NEGATIVE Final   Staphylococcus aureus NEGATIVE  NEGATIVE Final   Comment:            The Xpert SA Assay (FDA     approved for NASAL specimens     in patients over 89 years of age),     is one component of     a comprehensive surveillance     program.  Test performance has     been validated by [employer] for patients greater     than or equal to 59 year old.     It is not intended     to diagnose infection nor to     guide or monitor treatment.  ANAEROBIC CULTURE     Status: None   Collection Time    07/03/13  2:50 PM      Result Value Range Status   Specimen Description  ABSCESS   Final   Special Requests LEFT NASOPHARYNAL MASS   Final   Gram Stain     Final   Value: MODERATE WBC PRESENT,BOTH PMN AND MONONUCLEAR     NO SQUAMOUS EPITHELIAL CELLS SEEN     MODERATE GRAM POSITIVE COCCI     IN PAIRS     Performed at [facility]   Culture     Final   Value: NO ANAEROBES ISOLATED     Performed at [facility]   Report Status 07/08/2013 FINAL   Final  CULTURE, ROUTINE-ABSCESS     Status: None   Collection Time    07/03/13  2:50 PM      Result Value Range Status   Specimen Description ABSCESS   Final   Special Requests LEFT NASOPHARYNAL MASS   Final   Gram Stain     Final   Value: ABUNDANT WBC PRESENT,BOTH PMN AND MONONUCLEAR     NO SQUAMOUS EPITHELIAL CELLS SEEN     MODERATE GRAM POSITIVE COCCI     IN PAIRS   Culture FEW SERRATIA MARCESCENS   Final   Report Status 07/06/2013 FINAL   Final   Organism ID, Bacteria SERRATIA MARCESCENS   Final  TISSUE CULTURE  Status: None   Collection Time    07/03/13  3:47 PM      Result Value Range Status   Specimen Description TISSUE LEFT NASOPHARYNGEAL   Final   Special Requests NONE   Final   Gram Stain     Final   Value: NO WBC SEEN     NO SQUAMOUS EPITHELIAL CELLS SEEN     NO ORGANISMS SEEN     Performed at [facility]   Culture     Final   Value: NO GROWTH 3 DAYS     Performed at [facility]   Report Status 07/07/2013 FINAL   Final  AFB CULTURE WITH SMEAR     Status: None   Collection Time    07/03/13  3:47 PM      Result Value Range Status   Specimen Description TISSUE LEFT NASOPHARYNGEAL   Final   Special Requests NONE   Final   ACID FAST SMEAR     Final   Value: NO ACID FAST BACILLI SEEN     Performed at [facility]   Culture     Final   Value: CULTURE WILL BE EXAMINED FOR 6 WEEKS BEFORE ISSUING A FINAL REPORT     Performed at [facility]   Report Status PENDING   Incomplete  ANAEROBIC CULTURE     Status: None   Collection Time    07/03/13   3:47 PM      Result Value Range Status   Specimen Description TISSUE LEFT NASOPHARYNGEAL   Final   Special Requests NONE   Final   Gram Stain     Final   Value: NO WBC SEEN     NO SQUAMOUS EPITHELIAL CELLS SEEN     NO ORGANISMS SEEN     Performed at [facility]   Culture     Final   Value: NO ANAEROBES ISOLATED     Performed at [facility]   Report Status 07/08/2013 FINAL   Final  FUNGUS CULTURE W SMEAR     Status: None   Collection Time    07/03/13  3:47 PM      Result Value Range Status   Specimen Description TISSUE LEFT NASOPHARYNGEAL   Final   Special Requests NONE   Final   Fungal Smear     Final   Value: NO YEAST OR FUNGAL ELEMENTS SEEN     Performed at [facility]   Culture     Final   Value: CULTURE IN PROGRESS FOR FOUR WEEKS     Performed at [facility]   Report Status PENDING   Incomplete  URINE CULTURE     Status: None   Collection Time    07/04/13 12:27 AM      Result Value Range Status   Specimen Description URINE, CLEAN CATCH   Final   Special Requests unasyn Normal   Final   Culture  Setup Time     Final   Value: 07/04/2013 09:27     Performed at [facility] Count     Final   Value: NO GROWTH     Performed at [facility]   Culture     Final   Value: NO GROWTH     Performed at [facility]   Report Status 07/05/2013 FINAL   Final    Medical History: Past Medical History  Diagnosis Date  . Arthritis   .  Insomnia     takes Trazodone nightly  . PONV (postoperative nausea and vomiting)   . Hyperlipidemia     was on medication but has been off for a while  . Dizziness     was taking Meclizine but doesn't take now;found out that HR was 45  . Joint pain   . Joint swelling   . Itchy skin     scalp and uses a cream  . H/O hiatal hernia     takes Omeprazole daily  . Hemorrhoids   . Constipation   . History of colon polyps   . History of colitis     many yrs ago  . Urinary  frequency   . History of kidney stones     passed on his own  . Enlarged prostate   . Anxiety     takes Diazepam daily prn  . Depression     takes Trazodone nightly  . Insomnia     takes Trazodone nightly    Medications:  Prescriptions prior to admission  Medication Sig Dispense Refill  . betamethasone dipropionate (DIPROLENE) 0.05 % cream Apply 1 application topically daily as needed (itchy scalp).      . diazepam (VALIUM) 5 MG tablet Take 2.5 mg by mouth daily as needed for anxiety.       Marland Kitchen. HYDROcodone-acetaminophen (NORCO/VICODIN) 5-325 MG per tablet Take 1 tablet by mouth every 6 (six) hours as needed for moderate pain.      . meloxicam (MOBIC) 15 MG tablet Take 15 mg by mouth daily.      . predniSONE (DELTASONE) 20 MG tablet Take 2 tablets (40 mg total) by mouth daily.  12 tablet  0  . traMADol (ULTRAM) 50 MG tablet Take 50 mg by mouth every 6 (six) hours as needed.       Assessment: 80 yo man who was previously on cipro to start cefepime for serratia osteomyelitis.    Goal of Therapy:  Eradication of infection  Plan:  Cefepime 2gm IV q24 hours. F/u clinical course and renal function  Felicita Nuncio Poteet 07/08/2013,12:14 PM

## 2013-07-08 NOTE — Progress Notes (Signed)
Peripherally Inserted Central Catheter/Midline Placement  The IV Nurse has discussed with the patient and/or persons authorized to consent for the patient, the purpose of this procedure and the potential benefits and risks involved with this procedure.  The benefits include less needle sticks, lab draws from the catheter and patient may be discharged home with the catheter.  Risks include, but not limited to, infection, bleeding, blood clot (thrombus formation), and puncture of an artery; nerve damage and irregular heat beat.  Alternatives to this procedure were also discussed.  PICC/Midline Placement Documentation        Jason [name], Jason [name] 07/08/2013, 5:31 PM

## 2013-07-08 NOTE — Consult Note (Addendum)
[facility]  Total days of antibiotics 6        Day cipro #4        ( 2days of amp/sub)       Reason for Consult: skull based osteo with serratia    Referring Physician: bates  Active Problems:   Neck pain   Protein-calorie malnutrition, severe    HPI: Jason [name] is a 80 y.o. male who gas few medical problems,  history of deviated septum and nasal cyst/polyps on leftside which he had removed in mid oct. At that time he had intermittent dizziness, given meclizine. Subsequently in Nov/Dec he had increasing left sided headache with associated left ear pain. He states that ear pain was exquisitely tender radiating down neck. He was given pain medication without improvement. On New years eve, his wife noticed him having slurred speech prompting him to go to the ED for evaluation of possible stroke. On physical exam, he had fullness to posterior pharynx, left sided tongue paralysis, and soft palate. He underwent biopsy by Dr. Jenne Pane at that time where path showed chronic inflammation and necrosis, but no malignancy. No suspicion for infection at that time, no tissue sent for culture. In the meantime, the patient had ongoing pain and headache and continued to be followed by Dr. Jenne Pane. He underwent tympanostomy which helped his ear pain somewhat. Due to feeling worse, and noted leukocytosis on lab work he was admitted for further evaluation. The patient had MRI which showed nasopharyngeal 2.8 cm abn to petrous and clivus region of skull concerning for tumor. He went to OR on 1/7 for debridement and repeat sampling sent to path and culture. Path negative for malignancy. Culture showed a few serratia ( R cefazolin, S ceftriaxone, S bactrim, S cipro, S cefepime, S imi).gram stain did show gpc in pairs but spoke with micro and did not isolate any staph aureus. Patient was initially started on amp/sub but then converted to ciprofloxacin. ID asked to help manage skull based osteo.  He had been on his 2nd day of antibiotics at time of OR culture.   Past Medical History  Diagnosis Date  . Arthritis   . Insomnia     takes Trazodone nightly  . PONV (postoperative nausea and vomiting)   . Hyperlipidemia     was on medication but has been off for a while  . Dizziness     was taking Meclizine but doesn't take now;found out that HR was 45  . Joint pain   . Joint swelling   . Itchy skin     scalp and uses a cream  . H/O hiatal hernia     takes Omeprazole daily  . Hemorrhoids   . Constipation   . History of colon polyps   . History of colitis     many yrs ago  . Urinary frequency   . History of kidney stones     passed on his own  . Enlarged prostate   . Anxiety     takes Diazepam daily prn  . Depression     takes Trazodone nightly  . Insomnia     takes Trazodone nightly    Allergies:  Allergies  Allergen Reactions  . Bee Pollen Anaphylaxis  . Tetanus Toxoids Swelling    Tetanus Shot  . Hydrocodone Other (See Comments)    Confusion, "talking out of his head"    MEDICATIONS: . ciprofloxacin  400 mg Intravenous Q12H  . ciprofloxacin-dexamethasone  4 drop Left Ear BID  . docusate sodium  100 mg Oral BID  . insulin aspart  0-9 Units Subcutaneous TID WC  . pantoprazole sodium  40 mg Per Tube Daily  . predniSONE  40 mg Oral Daily  . triamcinolone cream   Topical BID    History  Substance Use Topics  . Smoking status: Former Smoker    Quit date: 07/01/1993  . Smokeless tobacco: Former Neurosurgeon    Types: Chew    Quit date: 12/29/2012     Comment: quit chewing tobacco several months ago and stopped smoking cigars yrs ago  . Alcohol Use: No    History reviewed. No pertinent family history.   Review of Systems  Constitutional: positive for weight loss 15# down. Negative for fever, chills, diaphoresis, activity change, appetite change, fatigue and unexpected weight change.  HENT: Negative for congestion, sore throat, rhinorrhea, sneezing, trouble  swallowing and sinus pressure.  Eyes: Negative for photophobia and visual disturbance.  Respiratory: Negative for cough, chest tightness, shortness of breath, wheezing and stridor.  Cardiovascular: Negative for chest pain, palpitations and leg swelling.  Gastrointestinal: Negative for nausea, vomiting, abdominal pain, diarrhea, constipation, blood in stool, abdominal distention and anal bleeding.  Genitourinary: Negative for dysuria, hematuria, flank pain and difficulty urinating.  Musculoskeletal: Negative for myalgias, back pain, joint swelling, arthralgias and gait problem.  Skin: Negative for color change, pallor, rash and wound.  Neurological: + headache, left ear pain. Difficulty swallowing due to decrease in tongue function Hematological: Negative for adenopathy. Does not bruise/bleed easily.  Psychiatric/Behavioral: Negative for behavioral problems, confusion, sleep disturbance, dysphoric mood, decreased concentration and agitation.     OBJECTIVE: Temp:  [97.3 F (36.3 C)-98 F (36.7 C)] 97.8 F (36.6 C) (01/12 0618) Pulse Rate:  [62-66] 66 (01/12 0618) Resp:  [18-20] 20 (01/12 0618) BP: (110-137)/(58-64) 117/58 mmHg (01/12 0618) SpO2:  [97 %-100 %] 97 % (01/12 0618) Weight:  [135 lb 9.3 oz (61.5 kg)] 135 lb 9.3 oz (61.5 kg) (01/12 0618) Physical Exam  Constitutional: He is oriented to person, place, and time. He appears well-developed and well-nourished. No distress.  HENT:  Mouth/Throat: Oropharynx is clear and moist. No oropharyngeal exudate.  Cardiovascular: Normal rate, regular rhythm and normal heart sounds. Exam reveals no gallop and no friction rub.  No murmur heard.  Pulmonary/Chest: Effort normal and breath sounds normal. No respiratory distress. He has no wheezes.  Abdominal: Soft. Bowel sounds are normal. He exhibits no distension. There is no tenderness.  Lymphadenopathy:  He has no cervical adenopathy.  Neurological: He is alert and oriented to person, place,  and time. Left tongue deviation, other CN intact.  Skin: Skin is warm and dry. No rash noted. No erythema.  Psychiatric: He has a normal mood and affect. His behavior is normal.     LABS: Results for orders placed during the hospital encounter of 07/01/13 (from the past 48 hour(s))  URINALYSIS, ROUTINE W REFLEX MICROSCOPIC     Status: Abnormal   Collection Time    07/06/13 11:56 AM      Result Value Range   Color, Urine YELLOW  YELLOW   APPearance CLOUDY (*) CLEAR   Specific Gravity, Urine 1.012  1.005 - 1.030   pH 5.5  5.0 - 8.0   Glucose, UA NEGATIVE  NEGATIVE mg/dL   Hgb urine dipstick NEGATIVE  NEGATIVE   Bilirubin Urine NEGATIVE  NEGATIVE   Ketones, ur NEGATIVE  NEGATIVE mg/dL   Protein, ur NEGATIVE  NEGATIVE mg/dL  Urobilinogen, UA 1.0  0.0 - 1.0 mg/dL   Nitrite NEGATIVE  NEGATIVE   Leukocytes, UA TRACE (*) NEGATIVE  URINE MICROSCOPIC-ADD ON     Status: None   Collection Time    07/06/13 11:56 AM      Result Value Range   WBC, UA 0-2  <3 WBC/hpf  GLUCOSE, CAPILLARY     Status: Abnormal   Collection Time    07/06/13 12:16 PM      Result Value Range   Glucose-Capillary 133 (*) 70 - 99 mg/dL  GLUCOSE, CAPILLARY     Status: Abnormal   Collection Time    07/06/13  5:03 PM      Result Value Range   Glucose-Capillary 138 (*) 70 - 99 mg/dL   Comment 1 Notify RN    GLUCOSE, CAPILLARY     Status: Abnormal   Collection Time    07/06/13 10:20 PM      Result Value Range   Glucose-Capillary 140 (*) 70 - 99 mg/dL   Comment 1 Notify RN    GLUCOSE, CAPILLARY     Status: None   Collection Time    07/07/13  7:40 AM      Result Value Range   Glucose-Capillary 87  70 - 99 mg/dL  GLUCOSE, CAPILLARY     Status: Abnormal   Collection Time    07/07/13 11:34 AM      Result Value Range   Glucose-Capillary 116 (*) 70 - 99 mg/dL  GLUCOSE, CAPILLARY     Status: Abnormal   Collection Time    07/07/13  4:54 PM      Result Value Range   Glucose-Capillary 155 (*) 70 - 99 mg/dL    GLUCOSE, CAPILLARY     Status: Abnormal   Collection Time    07/07/13 10:20 PM      Result Value Range   Glucose-Capillary 112 (*) 70 - 99 mg/dL   Comment 1 Notify RN    GLUCOSE, CAPILLARY     Status: Abnormal   Collection Time    07/08/13  7:45 AM      Result Value Range   Glucose-Capillary 113 (*) 70 - 99 mg/dL   Comment 1 Notify RN      MICRO: 1/7 tissue:  SERRATIA MARCESCENS    Antibiotic Sensitivity Microscan Status   CEFAZOLIN Resistant >=64 RESISTANT Final   Method: MIC   CEFEPIME Sensitive <=1 SENSITIVE Final   Method: MIC   CEFOXITIN Resistant 32 RESISTANT Performed at [facility] Final   Method: MIC   CEFTAZIDIME Sensitive <=1 SENSITIVE Final   Method: MIC   CEFTRIAXONE Sensitive <=1 SENSITIVE Final   Method: MIC   CIPROFLOXACIN Sensitive <=0.25 SENSITIVE Final   Method: MIC   GENTAMICIN Sensitive <=1 SENSITIVE Final   Method: MIC   TOBRAMYCIN Sensitive 2 SENSITIVE Final   Method: MIC   TRIMETH/SULFA Sensitive <=20 SENSITIVE Final   Method: MIC   Comments SERRATIA MARCESCENS (MIC)    FEW SERRATIA MARCESCENS       IMAGING: mri FINDINGS:  The patient would not allow a complete study.  The brainstem and cerebellum are unremarkable. The cerebral  hemispheres show mild age related atrophy with a few old small  vessel insults, less than often seen in healthy individuals of this  age. No cortical or large vessel territory infarction. No evidence  of intra-axial mass lesion, hemorrhage, hydrocephalus or extra-axial  fluid collection. Paranasal sinuses are clear. No pituitary mass.  There is fluid  throughout the mastoid air cells on the left. There  is bone destruction at the petrous apex on the left due to a  posterior nasopharyngeal mass measuring approximately 2.8 cm in  diameter. Tumor also invades the clivus.  IMPRESSION:  Study limited by patient tolerance.  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 Fluid throughout the  mastoid air cells on the left.  Assessment/Plan:  80yo M with serratia skull based osteomyelitis presenting with unilateral left sided headache, otitis externa and cranial neuropathy  - will check sed rate and crp - will order picc line - recommend to change from cipro to cefepime x 6 wks, then re-evaluate to do chronic suppressive therapy with oral antibiotics - swallow dysfunction -> we order speech and swallow eval to see if he can safely swallow, and techniques to improve his deficit - with picc line, home health to check weekly bmp, cbc, and picc line dressing changes - we will see back in clinic in 2 wks nad in 6 wks at completion of IV antibiotics  Vonnetta Akey B. Drue SecondSnider MD MPH [facility] [phone]

## 2013-07-08 NOTE — Progress Notes (Signed)
Agree with PTA.    Rushie Brazel, PT [phone]  

## 2013-07-08 NOTE — Progress Notes (Signed)
Physical Therapy Treatment Patient Details Name: Jason SchultzeBrice A [name] MRN: [id number] DOB: 26-Oct-1929 Today's Date: 07/08/2013 Time: [id number] PT Time Calculation (min): 24 min  PT Assessment / Plan / Recommendation  History of Present Illness 80 year old male with three weeks or more of left-sided headaches.  Early this week, tongue movement became abnormal and he went to the ER.  A head CT demonstrated fullness in the left nasopharynx which corresponded to an irregular fullness seen on endoscopy subsequently at the office.  He presents for biopsy.   PT Comments   Patient progressing well with ambulation. Able to push himself with mobility. Patient frustrated with swallowing issues and began to cry while working with therapy. Patient stating that he just wants to get better and go home. Updated to HHPT with 24 hour assistance as patient is progressing well.   Follow Up Recommendations  Home health PT;Supervision/Assistance - 24 hour;Supervision for mobility/OOB     Does the patient have the potential to tolerate intense rehabilitation     Barriers to Discharge        Equipment Recommendations  Rolling walker with 5" wheels    Recommendations for Other Services    Frequency Min 4X/week   Progress towards PT Goals Progress towards PT goals: Progressing toward goals  Plan Discharge plan needs to be updated    Precautions / Restrictions Precautions Precautions: Fall   Pertinent Vitals/Pain no apparent distress     Mobility  Bed Mobility Supine to sit: Min assist General bed mobility comments: Patient pulled up with assistance. Cues for technique and positioning Transfers Overall transfer level: Needs assistance Equipment used: Rolling walker (2 wheeled) Transfers: Sit to/from Stand Sit to Stand: Min assist General transfer comment: Min A to stabilize and cues for correct technique and hand placement Ambulation/Gait Ambulation/Gait assistance: Min guard Ambulation Distance  (Feet): 200 Feet Assistive device: Rolling walker (2 wheeled) Gait Pattern/deviations: Step-through pattern;Decreased stride length General Gait Details: Patient progressing with ambulation and safe use of RW. Patient pushing himself to ambulate as much as possible. Only required resting breaks when needing to attempt to swallow    Exercises     PT Diagnosis:    PT Problem List:   PT Treatment Interventions:     PT Goals (current goals can now be found in the care plan section)    Visit Information  Last PT Received On: 07/08/13 Assistance Needed: +1 History of Present Illness: 80 year old male with three weeks or more of left-sided headaches.  Early this week, tongue movement became abnormal and he went to the ER.  A head CT demonstrated fullness in the left nasopharynx which corresponded to an irregular fullness seen on endoscopy subsequently at the office.  He presents for biopsy.    Subjective Data      Cognition  Cognition Arousal/Alertness: Awake/alert Behavior During Therapy: WFL for tasks assessed/performed Overall Cognitive Status: Within Functional Limits for tasks assessed    Balance     End of Session PT - End of Session Equipment Utilized During Treatment: Gait belt Activity Tolerance: Patient tolerated treatment well Patient left: in chair;with call bell/phone within reach;with family/visitor present Nurse Communication: Mobility status   GP     Fredrich BirksRobinette, Julia Elizabeth 07/08/2013, 2:36 PM  07/08/2013 Fredrich Birksobinette, Julia Elizabeth PTA [phone] pager [phone] office

## 2013-07-08 NOTE — Progress Notes (Signed)
SLP Cancellation Note  Patient Details Name: Jason [name] MRN: [id number] DOB: 07-26-1929   Cancelled treatment:        Order received for Swallow evaluation.  Pt. Is currently undergoing a sterile procedure in the room.  SLP will f/u in am.  Pt. Is currently on clear liquids.  After reading chart, an objective evaluation is recommended.  Please order MBS if you agree.  Thank you.   Maryjo RochesterWillis, Yordy Matton T 07/08/2013, 4:02 PM

## 2013-07-08 NOTE — Progress Notes (Signed)
5 Days Post-Op  Subjective: Doing fairly well today.  Coughing and struggling to swallow.  Tolerating tube feeds.  Pain improved.  Objective: Vital signs in last 24 hours: Temp:  [97.3 F (36.3 C)-98 F (36.7 C)] 97.8 F (36.6 C) (01/12 0618) Pulse Rate:  [62-66] 66 (01/12 0618) Resp:  [18-20] 20 (01/12 0618) BP: (110-137)/(58-64) 117/58 mmHg (01/12 0618) SpO2:  [97 %-100 %] 97 % (01/12 0618) Weight:  [61.5 kg (135 lb 9.3 oz)] 61.5 kg (135 lb 9.3 oz) (01/12 0618) Last BM Date: 07/07/13  Intake/Output from previous day: 01/11 0701 - 01/12 0700 In: 5395 [P.O.:660; I.V.:3015; NG/GT:1320; IV Piggyback:400] Out: 2550 [Urine:2550] Intake/Output this shift:    General appearance: alert, cooperative and no distress  Lab Results:  No results found for this basename: WBC, HGB, HCT, PLT,  in the last 72 hours BMET No results found for this basename: NA, K, CL, CO2, GLUCOSE, BUN, CREATININE, CALCIUM,  in the last 72 hours PT/INR No results found for this basename: LABPROT, INR,  in the last 72 hours ABG No results found for this basename: PHART, PCO2, PO2, HCO3,  in the last 72 hours  Studies/Results: No results found.  Anti-infectives: Anti-infectives   Start     Dose/Rate Route Frequency Ordered Stop   07/05/13 1400  ciprofloxacin (CIPRO) IVPB 400 mg     400 mg 200 mL/hr over 60 Minutes Intravenous Every 12 hours 07/05/13 1257     07/01/13 1800  Ampicillin-Sulbactam (UNASYN) 3 g in sodium chloride 0.9 % 100 mL IVPB     3 g 100 mL/hr over 60 Minutes Intravenous Every 6 hours 07/01/13 1731 07/02/13 1359      Assessment/Plan: Skull base osteomyelitis with inflammatory pseudotumor, left hypoglossal and glossopharyngeal palsy, dysphagia, dysarthria, headache s/p Procedure(s): ENDOSCOPIC SINUS SURGERY WITH FUSION NAVIGATION with By-Nasopharongeal Biopsy (N/A) Will request speech pathology evaluation.  Waiting for OT evaluation.  Continue with PT.  Continue IV Unasyn and Cipro.   Will consult ID.  LOS: 7 days    Mayan Dolney 07/08/2013

## 2013-07-08 NOTE — Clinical Social Work Psychosocial (Signed)
Clinical Social Work Department BRIEF PSYCHOSOCIAL ASSESSMENT 07/08/2013  Patient:  Jason [name], Jason [name]     Account Number:  [id number]     Admit date:  07/01/2013  Clinical Social Worker:  Donna Christen  Date/Time:  07/08/2013 03:39 PM  Referred by:  Physician  Date Referred:  07/08/2013 Referred for  SNF Placement   Other Referral:   none.   Interview type:  Patient Other interview type:   Pt's wife, Bonnita Nasuti, was also present at bedside.    PSYCHOSOCIAL DATA Living Status:  WIFE Admitted from facility:   Level of care:   Primary support name:  Nigel Sloop Primary support relationship to patient:  SPOUSE Degree of support available:   Strong degree of support. Pt's wife would like to be able to care for pt at home, but believes with his difficulty with swallowing, he would be too complex for her to care for at home.    CURRENT CONCERNS Current Concerns  Post-Acute Placement   Other Concerns:   none.    SOCIAL WORK ASSESSMENT / PLAN CSW met with pt and pt's wife at bedside. While discussing the recommendation for SNF placement, pt and pt's wife seemed hesitant. CSW asked both pt and pt's wife what they were thinking regarding the recommendation for SNF placement; pt's wife stated that she would "love to take him off, but I can't care for him." Pt stated that he was unhappy about the possibility of going to SNF. CSW and pt discussed how it is in the pt's best interest to go home feeling stronger than he does now in order to prevent him from becoming injured and possibly resulting in being re-admitted to [facility]. Pt understood and agreed that SNF was his best option for recovery. CSW to fax clinical information out to [facility] and present bed offers to pt and his wife. CSW will continue to follow and assist with discharge planning needs.   Assessment/plan status:  Psychosocial Support/Ongoing Assessment of Needs Other assessment/ plan:   none.    Information/referral to community resources:   [employer] bed offers.    PATIENTS/FAMILYS RESPONSE TO PLAN OF CARE: Pt and pt's family understand pt's need for SNF placement at this time and are agreeable to CSW plan of care. Pt and pt's wife still not 100% sure SNF placement is their discharge disposition, but open to looking at their options.     Pati Gallo, [place] Social Worker [phone]

## 2013-07-08 NOTE — Progress Notes (Signed)
OT Cancellation Note  Patient Details Name: Regino SchultzeBrice A Fulgham MRN: [id number] DOB: 1929-11-04   Cancelled Treatment:    Reason Eval/Treat Not Completed: Patient at procedure or test/ unavailable. Pt in process of having PICC line placed. Will re-attempt eval tomorrow.  Evette GeorgesLeonard, Machael Raine Eva [phone] 07/08/2013, 4:07 PM

## 2013-07-09 ENCOUNTER — Inpatient Hospital Stay (HOSPITAL_COMMUNITY): Payer: Medicare Other

## 2013-07-09 ENCOUNTER — Encounter (INDEPENDENT_AMBULATORY_CARE_PROVIDER_SITE_OTHER): Payer: Self-pay | Admitting: Surgery

## 2013-07-09 DIAGNOSIS — G521 Disorders of glossopharyngeal nerve: Secondary | ICD-10-CM

## 2013-07-09 DIAGNOSIS — R1310 Dysphagia, unspecified: Secondary | ICD-10-CM

## 2013-07-09 DIAGNOSIS — G523 Disorders of hypoglossal nerve: Secondary | ICD-10-CM

## 2013-07-09 DIAGNOSIS — R471 Dysarthria and anarthria: Secondary | ICD-10-CM

## 2013-07-09 DIAGNOSIS — H05119 Granuloma of unspecified orbit: Secondary | ICD-10-CM

## 2013-07-09 LAB — BASIC METABOLIC PANEL
BUN: 19 mg/dL (ref 6–23)
CALCIUM: 8.3 mg/dL — AB (ref 8.4–10.5)
CO2: 28 meq/L (ref 19–32)
CREATININE: 0.72 mg/dL (ref 0.50–1.35)
Chloride: 101 mEq/L (ref 96–112)
GFR calc Af Amer: >90 mL/min (ref >90)
GFR, EST NON AFRICAN AMERICAN: 85 mL/min — AB (ref >90)
GLUCOSE: 131 mg/dL — AB (ref 70–99)
Potassium: 4.4 mEq/L (ref 3.7–5.3)
Sodium: 138 mEq/L (ref 137–147)

## 2013-07-09 LAB — GLUCOSE, CAPILLARY
GLUCOSE-CAPILLARY: 119 mg/dL — AB (ref 70–99)
Glucose-Capillary: 101 mg/dL — ABNORMAL HIGH (ref 70–99)
Glucose-Capillary: 152 mg/dL — ABNORMAL HIGH (ref 70–99)
Glucose-Capillary: 123 mg/dL — ABNORMAL HIGH (ref 70–99)

## 2013-07-09 LAB — SEDIMENTATION RATE: Sed Rate: 30 mm/hr — ABNORMAL HIGH (ref 0–16)

## 2013-07-09 LAB — CBC WITH DIFFERENTIAL/PLATELET
BASOS ABS: 0 10*3/uL (ref 0.0–0.1)
Basophils Relative: 0 % (ref 0–1)
EOS ABS: 0.1 10*3/uL (ref 0.0–0.7)
EOS PCT: 0 % (ref 0–5)
HEMATOCRIT: 35.1 % — AB (ref 39.0–52.0)
Hemoglobin: 11.6 g/dL — ABNORMAL LOW (ref 13.0–17.0)
LYMPHS PCT: 6 % — AB (ref 12–46)
Lymphs Abs: 1 10*3/uL (ref 0.7–4.0)
MCH: 28.4 pg (ref 26.0–34.0)
MCHC: 33 g/dL (ref 30.0–36.0)
MCV: 86 fL (ref 78.0–100.0)
MONO ABS: 0.6 10*3/uL (ref 0.1–1.0)
Monocytes Relative: 3 % (ref 3–12)
Neutro Abs: 14.9 10*3/uL — ABNORMAL HIGH (ref 1.7–7.7)
Neutrophils Relative %: 90 % — ABNORMAL HIGH (ref 43–77)
Platelets: 286 10*3/uL (ref 150–400)
RBC: 4.08 MIL/uL — ABNORMAL LOW (ref 4.22–5.81)
RDW: 14 % (ref 11.5–15.5)
WBC: 16.5 10*3/uL — AB (ref 4.0–10.5)

## 2013-07-09 LAB — C-REACTIVE PROTEIN: CRP: 0.6 mg/dL — ABNORMAL HIGH (ref <0.60)

## 2013-07-09 MED ORDER — ENSURE COMPLETE PO LIQD
237.0000 mL | Freq: Two times a day (BID) | ORAL | Status: DC
  Administered 2013-07-10 (×2): 237 mL via ORAL

## 2013-07-09 MED ORDER — MORPHINE SULFATE 2 MG/ML IJ SOLN
2.0000 mg | INTRAMUSCULAR | Status: DC | PRN
  Administered 2013-07-11 – 2013-07-18 (×10): 2 mg via INTRAVENOUS
  Filled 2013-07-09 (×10): qty 1

## 2013-07-09 MED ORDER — PREDNISONE 20 MG PO TABS
30.0000 mg | ORAL_TABLET | Freq: Every day | ORAL | Status: DC
  Administered 2013-07-10 – 2013-07-18 (×9): 30 mg via ORAL
  Filled 2013-07-09 (×9): qty 1

## 2013-07-09 MED ORDER — PANTOPRAZOLE SODIUM 40 MG PO TBEC
40.0000 mg | DELAYED_RELEASE_TABLET | Freq: Every day | ORAL | Status: DC
  Administered 2013-07-09 – 2013-07-18 (×9): 40 mg via ORAL
  Filled 2013-07-09 (×8): qty 1

## 2013-07-09 MED ORDER — ZOLPIDEM TARTRATE 5 MG PO TABS
5.0000 mg | ORAL_TABLET | Freq: Every evening | ORAL | Status: DC | PRN
  Administered 2013-07-10 – 2013-07-13 (×3): 5 mg via ORAL
  Filled 2013-07-09 (×3): qty 1

## 2013-07-09 NOTE — Progress Notes (Signed)
Calorie count envelope has been hung on the patient's door. Please document percent consumed for each item on the patient's meal tray ticket and keep in envelope. Also document percent of any supplement or snack pt consumes and keep documentation in envelope for RD to review.   Ian Malkineanne Barnett RD, LDN Pager: [phone] After Hours Pager: [phone]

## 2013-07-09 NOTE — Progress Notes (Signed)
[facility]    Date of Admission:  07/01/2013   Total days of antibiotics 7        Day 2 cefepime           ID: Regino SchultzeBrice A Bronder is a 80 y.o. male with Skull base osteomyelitis with inflammatory pseudotumor, left hypoglossal and glossopharyngeal palsy, dysphagia, dysarthria, headache . cx + serratia.  Active Problems:   Neck pain   Protein-calorie malnutrition, severe    Subjective: Afebrile. Had picc line placed without difficulty yesterday. He reports headache on left side localized to eyebrow and zygomatic arch area of face  Medications:  . ceFEPime (MAXIPIME) IV  2 g Intravenous Q24H  . ciprofloxacin-dexamethasone  4 drop Left Ear BID  . docusate sodium  100 mg Oral BID  . insulin aspart  0-9 Units Subcutaneous TID WC  . pantoprazole  40 mg Oral Daily  . predniSONE  40 mg Oral Daily  . triamcinolone cream   Topical BID    Objective: Vital signs in last 24 hours: Temp:  [97.9 F (36.6 C)-98.2 F (36.8 C)] 98.1 F (36.7 C) (01/13 0508) Pulse Rate:  [63-77] 77 (01/13 0508) Resp:  [20-21] 20 (01/13 0508) BP: (126-139)/(54-86) 139/72 mmHg (01/13 0508) SpO2:  [97 %-98 %] 98 % (01/13 0508) Weight:  [145 lb 8 oz (65.998 kg)] 145 lb 8 oz (65.998 kg) (01/13 0500) Physical Exam  Constitutional: He is oriented to person, place, and time. He appears well-developed and well-nourished. No distress.  HENT:  Mouth/Throat: Oropharynx is clear and moist. No oropharyngeal exudate.  Cardiovascular: Normal rate, regular rhythm and normal heart sounds. Exam reveals no gallop and no friction rub.  No murmur heard.  Pulmonary/Chest: Effort normal and breath sounds normal. No respiratory distress. He has no wheezes.  Abdominal: Soft. Bowel sounds are normal. He exhibits no distension. There is no tenderness.  Lymphadenopathy:  He has no cervical adenopathy.  Neurological: He is alert and oriented to person, place, and time. Left sided tongue deviation Skin:  Skin is warm and dry. No rash noted. No erythema.  Psychiatric: He has a normal mood and affect. His behavior is normal.    Lab Results  Recent Labs  07/08/13 2145  WBC 12.8*  HGB 11.3*  HCT 34.2*  NA 139  K 4.4  CL 102  CO2 27  BUN 19  CREATININE 0.77    Microbiology: 1/7 tissue:  SERRATIA MARCESCENS     Antibiotic  Sensitivity  Microscan  Status    CEFAZOLIN  Resistant  >=64 RESISTANT  Final    Method:  MIC    CEFEPIME  Sensitive  <=1 SENSITIVE  Final    Method:  MIC    CEFOXITIN  Resistant  32 RESISTANT [facility]formed at [facility]  Final    Method:  MIC    CEFTAZIDIME  Sensitive  <=1 SENSITIVE  Final    Method:  MIC    CEFTRIAXONE  Sensitive  <=1 SENSITIVE  Final    Method:  MIC    CIPROFLOXACIN  Sensitive  <=0.25 SENSITIVE  Final    Method:  MIC    GENTAMICIN  Sensitive  <=1 SENSITIVE  Final    Method:  MIC    TOBRAMYCIN  Sensitive  2 SENSITIVE  Final    Method:  MIC    TRIMETH/SULFA  Sensitive  <=20 SENSITIVE  Final    Method:  MIC    Comments  SERRATIA MARCESCENS (MIC)  FEW SERRATIA MARCESCENS         Assessment/Plan:  80 y.o. male with Skull base osteomyelitis with inflammatory pseudotumor, left hypoglossal and glossopharyngeal palsy, dysphagia, dysarthria, headache . cx + serratia.  - recommending 6 wks of IV antibiotics then switching to oral regimen if needed based upon repeat imaging.  Headache = treat symptoms, and will follow.   Drue Second [facility] Cell: [phone] Pager: [phone]  07/09/2013, 11:52 AM

## 2013-07-09 NOTE — Progress Notes (Signed)
6 Days Post-Op  Subjective: Still having some left ear/head pain at times.  Did not respond to tramadol yesterday.  Did not sleep well.  Objective: Vital signs in last 24 hours: Temp:  [97.9 F (36.6 C)-98.2 F (36.8 C)] 98.1 F (36.7 C) (01/13 0508) Pulse Rate:  [63-77] 77 (01/13 0508) Resp:  [20-21] 20 (01/13 0508) BP: (126-139)/(54-86) 139/72 mmHg (01/13 0508) SpO2:  [97 %-98 %] 98 % (01/13 0508) Weight:  [65.998 kg (145 lb 8 oz)] 65.998 kg (145 lb 8 oz) (01/13 0500) Last BM Date: 07/08/13  Intake/Output from previous day: 01/12 0701 - 01/13 0700 In: 2730 [P.O.:120; I.V.:1600; NG/GT:960; IV Piggyback:50] Out: 2250 [Urine:2250] Intake/Output this shift: Total I/O In: -  Out: 1 [Stool:1]  General appearance: alert, cooperative and no distress  Lab Results:   Recent Labs  07/08/13 2145  WBC 12.8*  HGB 11.3*  HCT 34.2*  PLT 322   BMET  Recent Labs  07/08/13 2145  NA 139  K 4.4  CL 102  CO2 27  GLUCOSE 128*  BUN 19  CREATININE 0.77  CALCIUM 8.3*   PT/INR No results found for this basename: LABPROT, INR,  in the last 72 hours ABG No results found for this basename: PHART, PCO2, PO2, HCO3,  in the last 72 hours  Studies/Results: No results found.  Anti-infectives: Anti-infectives   Start     Dose/Rate Route Frequency Ordered Stop   07/08/13 1230  ceFEPIme (MAXIPIME) 2 g in dextrose 5 % 50 mL IVPB     2 g 100 mL/hr over 30 Minutes Intravenous Every 24 hours 07/08/13 1220     07/05/13 1400  ciprofloxacin (CIPRO) IVPB 400 mg  Status:  Discontinued     400 mg 200 mL/hr over 60 Minutes Intravenous Every 12 hours 07/05/13 1257 07/08/13 1158   07/01/13 1800  Ampicillin-Sulbactam (UNASYN) 3 g in sodium chloride 0.9 % 100 mL IVPB     3 g 100 mL/hr over 60 Minutes Intravenous Every 6 hours 07/01/13 1731 07/02/13 1359      Assessment/Plan: Skull base osteomyelitis with inflammatory pseudotumor, left hypoglossal and glossopharyngeal palsy, dysphagia,  dysarthria, headache, left middle ear effusion with tymp tube s/p Procedure(s): ENDOSCOPIC SINUS SURGERY WITH FUSION NAVIGATION with By-Nasopharongeal Biopsy (N/A) PT/OT working with patient.  Speech path evaluation ongoing.  MBS today.  If needs ongoing enteral feeding based on results, a PEG will be ordered.  Will add Ambien at night.  Appreciate ID input.  Now on cefepime.  LOS: 8 days    Jason [name] 07/09/2013

## 2013-07-09 NOTE — Progress Notes (Addendum)
NUTRITION FOLLOW UP  Intervention:   Diet advancement per SLP/MD Discontinue TF Provide Ensure Complete po BID, each supplement provides 350 kcal and 13 grams of protein Continue Magic Cup with meals Start 48 hour Calorie Count RD to continue to monitor   Nutrition Dx:   Inadequate oral intake related to dysphagia, lethargy as evidenced by wife's report; ongoing  Goal:   Pt to meet >/= 90% of their estimated nutrition needs; being met  Monitor:   Weights; up 18 lbs since admission Labs; decreased hemoglobin, blood glucose ranging 91 to 145 mg/dL PO intake; few bites/sips at mealtimes TF initiation and tolerance; Jevity 1.2 @ goal rate of 60 ml/hr, pt tolerating I/O's: + 5.3 L since admission  Assessment:   Pt underwent endoscopic left nasopharynx biopsy with fusion image guidance 1/7. NG tube was placed during surgery. RD consulted for tube feeding initiation and management 1/8.   Pt's weight has increased 18 lbs since admission (question accuracy); pt now 4 lbs above usual body weight. Pt is tolerating Jevity 1.2 @ 60 ml/hr and continues to be on a full liquid diet. Pt just returned from [facility] and per pt's wife, pt has been approved for Pureed foods diet with thin liquids. Pt states that he has only been eating a few bites of food at each meal. Pt likes Ensure and Magic Cup supplements RD spoke with SLP regarding pt; MD wants to discontinue TF and see how pt does with PO intake.  Will add nutritional supplements and initiate calorie count tonight.  Will monitor PO intake and weight trends.   Height: Ht Readings from Last 1 Encounters:  07/02/13 5' 3"  (1.6 m)    Weight Status:   Wt Readings from Last 1 Encounters:  07/09/13 145 lb 8 oz (65.998 kg)  admission weight 127 lbs  Re-estimated needs:  Kcal: 1550-1770 Protein: 80-92 grams  Fluid: 1.7 L/day  Skin: incision on nose  Diet Order: Full Liquid   Intake/Output Summary (Last 24 hours) at 07/09/13 1404 Last data  filed at 07/09/13 1230  Gross per 24 hour  Intake   2080 ml  Output   2201 ml  Net   -121 ml    Last BM: 1/12   Labs:   Recent Labs Lab 07/04/13 1420 07/08/13 2145 07/09/13 1230  NA 137 139 138  K 4.6 4.4 4.4  CL 100 102 101  CO2 27 27 28   BUN 17 19 19   CREATININE 0.94 0.77 0.72  CALCIUM 8.5 8.3* 8.3*  GLUCOSE 145* 128* 131*    CBG (last 3)   Recent Labs  07/08/13 2208 07/09/13 0743 07/09/13 1143  GLUCAP 142* 119* 101*    Scheduled Meds: . ceFEPime (MAXIPIME) IV  2 g Intravenous Q24H  . ciprofloxacin-dexamethasone  4 drop Left Ear BID  . docusate sodium  100 mg Oral BID  . insulin aspart  0-9 Units Subcutaneous TID WC  . pantoprazole  40 mg Oral Daily  . [START ON 07/10/2013] predniSONE  30 mg Oral Q breakfast  . triamcinolone cream   Topical BID    Continuous Infusions: . dextrose 5 % and 0.45 % NaCl with KCl 20 mEq/L 100 mL/hr at 07/07/13 1853  . feeding supplement (JEVITY 1.2 CAL) 1,000 mL (07/08/13 2345)    Pryor Ochoa RD, LDN Inpatient Clinical Dietitian Pager: [phone] After Hours Pager: [phone]

## 2013-07-09 NOTE — Progress Notes (Signed)
PT Cancellation Note  Patient Details Name: Jason SchultzeBrice A Karel MRN: [id number] DOB: October 13, 1929   Cancelled Treatment:    Reason Eval/Treat Not Completed: Patient at procedure or test/unavailable. Attempted to see patient, however he is going for MBSS at this time. Will follow up tomorrow   Fredrich BirksRobinette, Julia Elizabeth 07/09/2013, 1:04 PM

## 2013-07-09 NOTE — Evaluation (Signed)
Clinical/Bedside Swallow Evaluation Patient Details  Name: MIKIAS LANZ MRN: [id number] Date of Birth: August 04, 1929  Today's Date: 07/09/2013 Time: 1030-1110 SLP Time Calculation (min): 40 min  Past Medical History:  Past Medical History  Diagnosis Date  . Arthritis   . Insomnia     takes Trazodone nightly  . PONV (postoperative nausea and vomiting)   . Hyperlipidemia     was on medication but has been off for a while  . Dizziness     was taking Meclizine but doesn't take now;found out that HR was 45  . Joint pain   . Joint swelling   . Itchy skin     scalp and uses a cream  . H/O hiatal hernia     takes Omeprazole daily  . Hemorrhoids   . Constipation   . History of colon polyps   . History of colitis     many yrs ago  . Urinary frequency   . History of kidney stones     passed on his own  . Enlarged prostate   . Anxiety     takes Diazepam daily prn  . Depression     takes Trazodone nightly  . Insomnia     takes Trazodone nightly   Past Surgical History:  Past Surgical History  Procedure Laterality Date  . Right knee arthroscopy    . Hernia repair      double  . Hemorrhoidectomy with hemorrhoid banding    . Tonsillectomy    . Bilateral cataract surgery    . Circumcision    . Colonoscopy    . Esophagogastroduodenoscopy    . Ear cyst excision N/A 04/05/2013    Procedure: CYST REMOVAL;  Surgeon: Melvenia Beam, MD;  Location: [facility] OR;  Service: ENT;  Laterality: N/A;  . Septoplasty N/A 04/05/2013    Procedure: SEPTOPLASTY;  Surgeon: Melvenia Beam, MD;  Location: [facility] OR;  Service: ENT;  Laterality: N/A;  . Direct laryngoscopy N/A 06/26/2013    Procedure: DIRECT LARYNGOSCOPY;  Surgeon: Christia Reading, MD;  Location: [facility] OR;  Service: ENT;  Laterality: N/A;  . Sinus endo w/fusion N/A 07/03/2013    Procedure: ENDOSCOPIC SINUS SURGERY WITH FUSION NAVIGATION with By-Nasopharongeal Biopsy;  Surgeon: Christia Reading, MD;  Location: [facility] OR;  Service: ENT;  Laterality: N/A;    HPI:  Chief Complaint: left nasopharynx mass, left tongue and palate weakness   Assessment / Plan / Recommendation Clinical Impression  Pt. exhibits s/s of dysphagia with possible aspiration.  Objective swallow eval is warranted prior to advancing diet.  Recommend MBS so that velopharyngeal closure may also be assessed.    Aspiration Risk  Moderate    Diet Recommendation          Other  Recommendations Recommended Consults: MBS   Follow Up Recommendations       Frequency and Duration        Pertinent Vitals/Pain n/a    SLP Swallow Goals     Swallow Study Prior Functional Status       General HPI: Chief Complaint: left nasopharynx mass, left tongue and palate weakness Type of Study: Bedside swallow evaluation Previous Swallow Assessment: none found Diet Prior to this Study: Thin liquids (Full liquid diet) Temperature Spikes Noted: No Respiratory Status: Room air History of Recent Intubation: Yes Length of Intubations (days): 1 days (for surgery) Date extubated: 06/26/13 Behavior/Cognition: Alert;Cooperative (frustrated) Oral Cavity - Dentition: Adequate natural dentition Self-Feeding Abilities: Able to feed self Patient Positioning: Upright in chair Baseline Vocal  Quality: Clear;Low vocal intensity (hypernasal) Volitional Cough: Weak Volitional Swallow: Able to elicit    Oral/Motor/Sensory Function Overall Oral Motor/Sensory Function: Impaired Labial ROM: Reduced left Labial Strength: Reduced Lingual ROM: Reduced left Lingual Symmetry: Abnormal symmetry left Lingual Strength: Reduced Facial Symmetry: Within Functional Limits Facial Strength: Reduced Velum: Impaired right;Impaired left Mandible: Within Functional Limits   Ice Chips Ice chips: Not tested   Thin Liquid Thin Liquid: Impaired Presentation: Spoon Pharyngeal  Phase Impairments: Suspected delayed Swallow;Decreased hyoid-laryngeal movement;Multiple swallows;Throat Clearing - Immediate;Cough -  Delayed    Nectar Thick Nectar Thick Liquid: Impaired Presentation: Cup Pharyngeal Phase Impairments: Suspected delayed Swallow;Decreased hyoid-laryngeal movement;Multiple swallows;Throat Clearing - Immediate   Honey Thick Honey Thick Liquid: Not tested   Puree Puree: Impaired Presentation: Self Fed Pharyngeal Phase Impairments: Suspected delayed Swallow;Decreased hyoid-laryngeal movement;Multiple swallows;Throat Clearing - Immediate   Solid   GO    Solid: Not tested       Maryjo RochesterWillis, Disaya Walt T 07/09/2013,12:18 PM

## 2013-07-09 NOTE — Evaluation (Signed)
Occupational Therapy Evaluation Patient Details Name: Jason [name] MRN: [id number] DOB: [birth year] Today's Date: 07/09/2013 Time: [id number] OT Time Calculation (min): 32 min  OT Assessment / Plan / Recommendation History of present illness 80 year old male with three weeks or more of left-sided headaches.  Early this week, tongue movement became abnormal and he went to the ER.  A head CT demonstrated fullness in the left nasopharynx which corresponded to an irregular fullness seen on endoscopy subsequently at the office.  He presents for biopsy.   Clinical Impression   Pt presents with below problem list. Pt independent with ADLs, PTA. Feel pt will benefit from acute OT to increase independence prior to d/c.     OT Assessment  Patient needs continued OT Services    Follow Up Recommendations  Home health OT;Supervision/Assistance - 24 hour    Barriers to Discharge    Equipment Recommendations  None recommended by OT    Recommendations for Other Services    Frequency  Min 2X/week    Precautions / Restrictions Precautions Precautions: Fall Restrictions Weight Bearing Restrictions: No   Pertinent Vitals/Pain Headache on left side near eyebrow. Nurse notified.     ADL  Grooming: Teeth care;Wash/dry hands;Set up;Supervision/safety Where Assessed - Grooming: Supported standing Lower Body Dressing: Min guard Where Assessed - Lower Body Dressing: Supported sit to Pharmacist, hospitalstand Toilet Transfer: Hydrographic surveyorMin guard Toilet Transfer Method: Sit to Baristastand Toilet Transfer Equipment: Comfort height toilet Toileting - ArchitectClothing Manipulation and Hygiene: Min guard Where Assessed - Engineer, miningToileting Clothing Manipulation and Hygiene: Standing;Sit to stand from 3-in-1 or toilet Tub/Shower Transfer: Minimal assistance Tub/Shower Transfer Method: Ambulating Tub/Shower Transfer Equipment: Walk in shower;Shower seat with back Equipment Used: Gait belt;Rolling walker Transfers/Ambulation Related to ADLs: Min guard for  ambulation. Min guard/supervision for sit <> stand transfers. ADL Comments: Recommended sitting on chair/bed for LB dressign and standing in front of chair/bed with walker in front when pulling up LB clothing. Recommended having someone with him for shower transfer. [facility] at [facility]-pt unsure if walker/chair will fit. OT told pt that if walker does not fit in shower, recommended sponge bathing with someone with him.  Pt coughed up rather large object-nurse notified. Reinforced pt to turn head to left to swallow and to swallow twice. Spoke with pt about using bag on walker to carry items.    OT Diagnosis: Acute pain;Generalized weakness  OT Problem List: Decreased strength;Impaired balance (sitting and/or standing);Decreased activity tolerance;Decreased knowledge of use of DME or AE;Decreased knowledge of precautions;Pain OT Treatment Interventions: Self-care/ADL training;Therapeutic exercise;Therapeutic activities;DME and/or AE instruction;Patient/family education;Balance training   OT Goals(Current goals can be found in the care plan section) Acute Rehab OT Goals Patient Stated Goal: none stated OT Goal Formulation: With patient Time For Goal Achievement: 07/16/13 Potential to Achieve Goals: Good ADL Goals Pt Will Perform Lower Body Dressing: with modified independence;sit to/from stand Pt Will Transfer to Toilet: with modified independence;ambulating (comfort height toilet ) Pt Will Perform Toileting - Clothing Manipulation and hygiene: with modified independence;sit to/from stand  Visit Information  Last OT Received On: 07/09/13 Assistance Needed: +1 History of Present Illness: 80 year old male with three weeks or more of left-sided headaches.  Early this week, tongue movement became abnormal and he went to the ER.  A head CT demonstrated fullness in the left nasopharynx which corresponded to an irregular fullness seen on endoscopy  subsequently at the office.  He presents for biopsy.  Prior Functioning     Home Living Family/patient expects to be discharged to:: Private residence Living Arrangements: Spouse/significant other Available Help at Discharge: Available PRN/intermittently (most of the time) Type of Home: House Home Access: Level entry Home Layout: Two level;Able to live on main level with bedroom/bathroom Alternate Level Stairs-Number of Steps: one step out to garage Home Equipment: Crutches;Cane - single point;Walker - 2 wheels;Shower seat - built in Prior Function Level of Independence: Independent Communication Communication: No difficulties         Vision/Perception     Copywriter, advertising Arousal/Alertness: Awake/alert Behavior During Therapy: WFL for tasks assessed/performed Overall Cognitive Status: Within Functional Limits for tasks assessed    Extremity/Trunk Assessment Upper Extremity Assessment Upper Extremity Assessment: Overall WFL for tasks assessed Lower Extremity Assessment Lower Extremity Assessment: Defer to PT evaluation     Mobility Bed Mobility Overal bed mobility: Needs Assistance Bed Mobility: Supine to Sit;Sit to Supine Supine to sit: Supervision Sit to supine: Supervision Transfers Overall transfer level: Needs assistance Equipment used: Rolling walker (2 wheeled) Transfers: Sit to/from Stand Sit to Stand: Min guard;Supervision General transfer comment: Supervision/Min guard for stand to sit transfers. Cues for hand placement.     Exercise     Balance     End of Session OT - End of Session Equipment Utilized During Treatment: Gait belt;Rolling walker Activity Tolerance: Patient tolerated treatment well Patient left: in bed;with call bell/phone within reach Nurse Communication: Other (comment) (coughed up something )  GO     Earlie Raveling OTR/L [phone] 07/09/2013, 4:28 PM

## 2013-07-09 NOTE — Progress Notes (Signed)
Physical Therapy Treatment Patient Details Name: Jason [name] MRN: [id number] DOB: 01/31/1930 Today's Date: 07/09/2013 Time: 0835-0900 PT Time Calculation (min): 25 min  PT Assessment / Plan / Recommendation  History of Present Illness 80 year old male with three weeks or more of left-sided headaches.  Early this week, tongue movement became abnormal and he went to the ER.  A head CT demonstrated fullness in the left nasopharynx which corresponded to an irregular fullness seen on endoscopy subsequently at the office.  He presents for biopsy.   PT Comments   Pt motivated to improve gait distance, able to gait with supervision 400'.  Discussed d/c plan with pt's wife.  Pt's wife states she feels comfortable with his physical abilities at this point (pt is supervision, needing no physical assist). Pt's wife states her biggest concerns are with swallowing.  PT encouraged wife to think of any more physical barriers to home and she stated the bed is very high.  PT/wife problem solved home safety and set up ideas for safety at home.  PT continues to recommend HHPT for home assessment at d/c.  Follow Up Recommendations  Home health PT;Supervision/Assistance - 24 hour;Supervision for mobility/OOB     Does the patient have the potential to tolerate intense rehabilitation     Barriers to Discharge        Equipment Recommendations  Rolling walker with 5" wheels    Recommendations for Other Services    Frequency Min 4X/week   Progress towards PT Goals Progress towards PT goals: Goals met and updated - see care plan  Plan Current plan remains appropriate    Precautions / Restrictions Precautions Precautions: Fall Restrictions Weight Bearing Restrictions: No   Pertinent Vitals/Pain Pt c/o headache at end of session, asks to rest.    Mobility  Bed Mobility Supine to sit: Supervision General bed mobility comments: uses bed rails Transfers Equipment used: Rolling walker (2 wheeled) Sit to  Stand: Supervision General transfer comment: pt with no physical assistance to stand Ambulation/Gait Ambulation/Gait assistance: Supervision Ambulation Distance (Feet): 400 Feet Assistive device: Rolling walker (2 wheeled) General Gait Details: pt very motivated to increase ambulation distance today, no rest breaks required during gait    Exercises     PT Diagnosis:    PT Problem List:   PT Treatment Interventions:     PT Goals (current goals can now be found in the care plan section)    Visit Information  Last PT Received On: 07/09/13 Assistance Needed: +1 History of Present Illness: 80 year old male with three weeks or more of left-sided headaches.  Early this week, tongue movement became abnormal and he went to the ER.  A head CT demonstrated fullness in the left nasopharynx which corresponded to an irregular fullness seen on endoscopy subsequently at the office.  He presents for biopsy.    Subjective Data      Cognition  Cognition Arousal/Alertness: Awake/alert Behavior During Therapy: WFL for tasks assessed/performed Overall Cognitive Status: Within Functional Limits for tasks assessed    Balance     End of Session PT - End of Session Equipment Utilized During Treatment: Gait belt Activity Tolerance: Patient tolerated treatment well Patient left: in chair;with call bell/phone within reach;with family/visitor present Nurse Communication: Mobility status   GP     Jason [name] 07/09/2013, 10:18 AM

## 2013-07-09 NOTE — Procedures (Signed)
Objective Swallowing Evaluation: Modified Barium Swallowing Study  Patient Details  Name: Jason [name] MRN: [id number] Date of Birth: 11-Feb-1930  Today's Date: 07/09/2013 Time: 1330-1420 SLP Time Calculation (min): 50 min  Past Medical History:  Past Medical History  Diagnosis Date  . Arthritis   . Insomnia     takes Trazodone nightly  . PONV (postoperative nausea and vomiting)   . Hyperlipidemia     was on medication but has been off for a while  . Dizziness     was taking Meclizine but doesn't take now;found out that HR was 45  . Joint pain   . Joint swelling   . Itchy skin     scalp and uses a cream  . H/O hiatal hernia     takes Omeprazole daily  . Hemorrhoids   . Constipation   . History of colon polyps   . History of colitis     many yrs ago  . Urinary frequency   . History of kidney stones     passed on his own  . Enlarged prostate   . Anxiety     takes Diazepam daily prn  . Depression     takes Trazodone nightly  . Insomnia     takes Trazodone nightly   Past Surgical History:  Past Surgical History  Procedure Laterality Date  . Right knee arthroscopy    . Hernia repair      double  . Hemorrhoidectomy with hemorrhoid banding    . Tonsillectomy    . Bilateral cataract surgery    . Circumcision    . Colonoscopy    . Esophagogastroduodenoscopy    . Ear cyst excision N/A 04/05/2013    Procedure: CYST REMOVAL;  Surgeon: Melvenia Beam, MD;  Location[facility] OR;  Service: ENT;  Laterality: N/A;  . Septoplasty N/A 04/05/2013    Procedure: SEPTOPLASTY;  Surgeon: Melvenia Beam, MD;  Location: [facility] OR;  Service: ENT;  Laterality: N/A;  . Direct laryngoscopy N/A 06/26/2013    Procedure: DIRECT LARYNGOSCOPY;  Surgeon: Christia Reading, MD;  Location: [facility] OR;  Service: ENT;  Laterality: N/A;  . Sinus endo w/fusion N/A 07/03/2013    Procedure: ENDOSCOPIC SINUS SURGERY WITH FUSION NAVIGATION with By-Nasopharongeal Biopsy;  Surgeon: Christia Reading, MD;  Location: [facility] OR;   Service: ENT;  Laterality: N/A;   HPI:  Chief Complaint: left nasopharynx mass, left tongue and palate weakness     Assessment / Plan / Recommendation Clinical Impression  Dysphagia Diagnosis: Mild oral phase dysphagia;Moderate pharyngeal phase dysphagia Clinical impression: Patient presents with a mild oral and moderate sensori-motor pharyngeal dysphagia, characterized by lingual weakness and decresed ability to manipulate solid boluses, delayed swallow initiation, decreased base of tongue contraction to the pharyngeal wall, decreased velopharyngeal closure, decreased anterior excursion and elevation of the hyoid and decreased laryngeal elevation.  These deficits result in severe pooling/residue with all consistencies in the valleculae and pyriform sinuses.  However, when turning head to left and swallowing twice with each bite/sip, there was minimal laryngeal residue.  Pt. did not aspirate during this study, but is at risk for aspiration if he is unable to prevent or clear laryngeal residue.  Pt., wife, and grand-daughter were educated extensively, and case was discussed with Dr. Jenne Pane, RN, and nutritionist.  MD ordered d/c of Panda tube with hopes of adequate po intake so that PEG might be avoided.      Treatment Recommendation  Therapy as outlined in treatment plan below    Diet  Recommendation Dysphagia 1 (Puree);Thin liquid   Liquid Administration via: Cup;No straw Medication Administration: Whole meds with puree Supervision: Staff to assist with self feeding;Full supervision/cueing for compensatory strategies Compensations: Slow rate;Small sips/bites;Multiple dry swallows after each bite/sip Postural Changes and/or Swallow Maneuvers: Out of bed for meals;Seated upright 90 degrees;Head turn left during swallow    Other  Recommendations Oral Care Recommendations: Oral care BID;Staff/trained caregiver to provide oral care Other Recommendations: Have oral suction available;Clarify dietary  restrictions   Follow Up Recommendations       Frequency and Duration min 2x/week  2 weeks   Pertinent Vitals/Pain afebrile    SLP Swallow Goals  see care plan   General HPI: Chief Complaint: left nasopharynx mass, left tongue and palate weakness Type of Study: Modified Barium Swallowing Study Reason for Referral: Objectively evaluate swallowing function Previous Swallow Assessment: BSE this am Diet Prior to this Study: Thin liquids Temperature Spikes Noted: No Respiratory Status: Room air History of Recent Intubation: Yes Length of Intubations (days): 1 days Date extubated: 06/26/13 Behavior/Cognition: Alert;Cooperative Oral Cavity - Dentition: Adequate natural dentition Oral Motor / Sensory Function: Impaired - see Bedside swallow eval Self-Feeding Abilities: Able to feed self Patient Positioning: Upright in chair Baseline Vocal Quality: Clear;Low vocal intensity Volitional Cough: Weak Volitional Swallow: Able to elicit Pharyngeal Secretions: Not observed secondary MBS    Reason for Referral Objectively evaluate swallowing function   Oral Phase Oral Preparation/Oral Phase Oral Phase: Impaired Oral - Solids Oral - Regular: Impaired mastication;Weak lingual manipulation;Incomplete tongue to palate contact;Delayed oral transit   Pharyngeal Phase Pharyngeal Phase Pharyngeal Phase: Impaired Pharyngeal - Nectar Pharyngeal - Nectar Cup: Premature spillage to valleculae;Reduced pharyngeal peristalsis;Reduced epiglottic inversion;Reduced anterior laryngeal mobility;Reduced laryngeal elevation;Reduced tongue base retraction;Pharyngeal residue - valleculae;Pharyngeal residue - pyriform sinuses;Pharyngeal residue - posterior pharnyx;Compensatory strategies attempted (Comment);Delayed swallow initiation (chin tuck not effective; Better with head turn to left!) Pharyngeal - Thin Pharyngeal - Thin Cup: Premature spillage to valleculae;Reduced pharyngeal peristalsis;Reduced epiglottic  inversion;Reduced anterior laryngeal mobility;Reduced laryngeal elevation;Reduced tongue base retraction;Pharyngeal residue - valleculae;Pharyngeal residue - pyriform sinuses;Pharyngeal residue - posterior pharnyx;Compensatory strategies attempted (Comment);Delayed swallow initiation Pharyngeal - Solids Pharyngeal - Puree: Premature spillage to valleculae;Reduced pharyngeal peristalsis;Reduced epiglottic inversion;Reduced anterior laryngeal mobility;Reduced laryngeal elevation;Reduced tongue base retraction;Pharyngeal residue - valleculae;Pharyngeal residue - pyriform sinuses;Pharyngeal residue - posterior pharnyx;Compensatory strategies attempted (Comment);Delayed swallow initiation  Cervical Esophageal Phase    GO    Cervical Esophageal Phase Cervical Esophageal Phase: Vicente MassonWFL         Bridgit Eynon T 07/09/2013, 3:06 PM

## 2013-07-10 DIAGNOSIS — E46 Unspecified protein-calorie malnutrition: Secondary | ICD-10-CM

## 2013-07-10 DIAGNOSIS — D72829 Elevated white blood cell count, unspecified: Secondary | ICD-10-CM

## 2013-07-10 LAB — GLUCOSE, CAPILLARY
GLUCOSE-CAPILLARY: 91 mg/dL (ref 70–99)
GLUCOSE-CAPILLARY: 127 mg/dL — AB (ref 70–99)
GLUCOSE-CAPILLARY: 113 mg/dL — AB (ref 70–99)
Glucose-Capillary: 114 mg/dL — ABNORMAL HIGH (ref 70–99)

## 2013-07-10 MED ORDER — ENSURE COMPLETE PO LIQD
237.0000 mL | Freq: Three times a day (TID) | ORAL | Status: DC
  Administered 2013-07-11 (×2): 237 mL via ORAL

## 2013-07-10 MED ORDER — BOOST / RESOURCE BREEZE PO LIQD
1.0000 | ORAL | Status: DC
  Administered 2013-07-11: 1 via ORAL

## 2013-07-10 NOTE — Progress Notes (Signed)
Physical Therapy Treatment Patient Details Name: Regino SchultzeBrice A Burleson MRN: [id number] DOB: February 14, 1930 Today's Date: 07/10/2013 Time: [id number] PT Time Calculation (min): 26 min  PT Assessment / Plan / Recommendation  History of Present Illness 80 year old male with three weeks or more of left-sided headaches.  Early this week, tongue movement became abnormal and he went to the ER.  A head CT demonstrated fullness in the left nasopharynx which corresponded to an irregular fullness seen on endoscopy subsequently at the office.  He presents for biopsy.   PT Comments   Patient progressing well. Eager to mobilize and get up out of bed. Patient stating that he has a high bed at home and will have to use a stool to get up in it. Patient instructed and able to verbalize technique  Follow Up Recommendations  Home health PT;Supervision/Assistance - 24 hour;Supervision for mobility/OOB     Does the patient have the potential to tolerate intense rehabilitation     Barriers to Discharge        Equipment Recommendations  Rolling walker with 5" wheels    Recommendations for Other Services    Frequency Min 4X/week   Progress towards PT Goals Progress towards PT goals: Progressing toward goals  Plan Current plan remains appropriate    Precautions / Restrictions Precautions Precautions: Fall   Pertinent Vitals/Pain no apparent distress     Mobility  Bed Mobility Supine to sit: Supervision General bed mobility comments: uses bed rails Transfers Overall transfer level: Needs assistance Sit to Stand: Min guard;Supervision General transfer comment: Supervision/Min guard for stand to sit transfers. Cues for hand placement. Ambulation/Gait Ambulation/Gait assistance: Supervision Ambulation Distance (Feet): 650 Feet Assistive device: Rolling walker (2 wheeled) General Gait Details: Patient wakling with increased steadiness, very eager to increase distance with ambulation    Exercises     PT  Diagnosis:    PT Problem List:   PT Treatment Interventions:     PT Goals (current goals can now be found in the care plan section)    Visit Information  Last PT Received On: 07/10/13 Assistance Needed: +1 History of Present Illness: 80 year old male with three weeks or more of left-sided headaches.  Early this week, tongue movement became abnormal and he went to the ER.  A head CT demonstrated fullness in the left nasopharynx which corresponded to an irregular fullness seen on endoscopy subsequently at the office.  He presents for biopsy.    Subjective Data      Cognition  Cognition Arousal/Alertness: Awake/alert Behavior During Therapy: WFL for tasks assessed/performed Overall Cognitive Status: Within Functional Limits for tasks assessed    Balance     End of Session PT - End of Session Equipment Utilized During Treatment: Gait belt Activity Tolerance: Patient tolerated treatment well Patient left: in chair;with call bell/phone within reach;with family/visitor present Nurse Communication: Mobility status   GP     Fredrich BirksRobinette, Matheson Vandehei Elizabeth 07/10/2013, 1:48 PM  07/10/2013 Fredrich Birksobinette, Muneer Leider Elizabeth PTA [phone] pager [phone] office

## 2013-07-10 NOTE — Progress Notes (Addendum)
[facility]    Date of Admission:  07/01/2013   Total days of antibiotics 8        Day 3 cefepime           ID: Jason [name] is a 80 y.o. male with Skull base osteomyelitis with inflammatory pseudotumor, left hypoglossal and glossopharyngeal palsy, dysphagia, dysarthria, headache . cx + serratia.  Active Problems:   Neck pain   Protein-calorie malnutrition, severe    Subjective: Afebrile. Undergoing calorie count. Headache mildly improved. Wife concern about caloric intake  Medications:  . ceFEPime (MAXIPIME) IV  2 g Intravenous Q24H  . ciprofloxacin-dexamethasone  4 drop Left Ear BID  . docusate sodium  100 mg Oral BID  . feeding supplement (ENSURE COMPLETE)  237 mL Oral BID BM  . insulin aspart  0-9 Units Subcutaneous TID WC  . pantoprazole  40 mg Oral Daily  . predniSONE  30 mg Oral Q breakfast  . triamcinolone cream   Topical BID    Objective: Vital signs in last 24 hours: Temp:  [97.5 F (36.4 C)-97.8 F (36.6 C)] 97.5 F (36.4 C) (01/14 0604) Pulse Rate:  [63-70] 63 (01/14 0604) Resp:  [16-17] 16 (01/14 0604) BP: (126-133)/(54-64) 133/54 mmHg (01/14 0604) SpO2:  [97 %-98 %] 98 % (01/14 0604) Physical Exam  Constitutional: He is oriented to person, place, and time. He appears well-developed and well-nourished. No distress.  HENT:  Mouth/Throat: Oropharynx is clear and moist. No oropharyngeal exudate.  Cardiovascular: Normal rate, regular rhythm and normal heart sounds. Exam reveals no gallop and no friction rub.  No murmur heard.  Pulmonary/Chest: Effort normal and breath sounds normal. No respiratory distress. He has no wheezes.  Abdominal: Soft. Bowel sounds are normal. He exhibits no distension. There is no tenderness.  Lymphadenopathy:  He has no cervical adenopathy.  Neurological: He is alert and oriented to person, place, and time. Left sided tongue deviation Skin: Skin is warm and dry. No rash noted. No erythema.    Psychiatric: He has a normal mood and affect. His behavior is normal.    Lab Results  Recent Labs  07/08/13 2145 07/09/13 1230  WBC 12.8* 16.5*  HGB 11.3* 11.6*  HCT 34.2* 35.1*  NA 139 138  K 4.4 4.4  CL 102 101  CO2 27 28  BUN 19 19  CREATININE 0.77 0.72    Microbiology: 1/7 tissue:  SERRATIA MARCESCENS     Antibiotic  Sensitivity  Microscan  Status    CEFAZOLIN  Resistant  >=64 RESISTANT  Final    Method:  MIC    CEFEPIME  Sensitive  <=1 SENSITIVE  Final    Method:  MIC    CEFOXITIN  Resistant  32 RESISTANT Performed at [employer] [facility]  Final    Method:  MIC    CEFTAZIDIME  Sensitive  <=1 SENSITIVE  Final    Method:  MIC    CEFTRIAXONE  Sensitive  <=1 SENSITIVE  Final    Method:  MIC    CIPROFLOXACIN  Sensitive  <=0.25 SENSITIVE  Final    Method:  MIC    GENTAMICIN  Sensitive  <=1 SENSITIVE  Final    Method:  MIC    TOBRAMYCIN  Sensitive  2 SENSITIVE  Final    Method:  MIC    TRIMETH/SULFA  Sensitive  <=20 SENSITIVE  Final    Method:  MIC    Comments  SERRATIA MARCESCENS (MIC)  FEW SERRATIA MARCESCENS         Assessment/Plan:  80 y.o. male with Skull base osteomyelitis with inflammatory pseudotumor, left hypoglossal and glossopharyngeal palsy, dysphagia, dysarthria, headache . cx + serratia.  - recommending 6 wks of IV antibiotics then switching to oral regimen if needed based upon repeat imaging.  Headache = treat symptoms, and will follow.  Leukocytosis = likely due to current prednisone dose  Protein-caloric malnutrition = agree with Dr. Jenne PaneBates' plan for evaluation of meeting nutritional needs and need for peg  Will sign off and have him followed in ID clinic in 2wk in addition to 6 wks. Would ask to have home health or snf to continue to check weekly cbc, and bmp while on antibiotics. In addition, will need weekly picc line dressing change   [name], [facility] Cell: [phone] Pager:  [phone]  07/10/2013, 11:18 AM

## 2013-07-10 NOTE — Progress Notes (Signed)
7 Days Post-Op  Subjective: Continues to improve.  Feeding tube removed yesterday after good swallow evaluation results.  Calorie count ongoing.  Objective: Vital signs in last 24 hours: Temp:  [97.5 F (36.4 C)-97.8 F (36.6 C)] 97.5 F (36.4 C) (01/14 0604) Pulse Rate:  [63-70] 63 (01/14 0604) Resp:  [16-17] 16 (01/14 0604) BP: (126-133)/(54-64) 133/54 mmHg (01/14 0604) SpO2:  [97 %-98 %] 98 % (01/14 0604) Last BM Date: 07/09/13  Intake/Output from previous day: 01/13 0701 - 01/14 0700 In: 2930 [I.V.:2400; NG/GT:480; IV Piggyback:50] Out: 4176 [Urine:4175; Stool:1] Intake/Output this shift:    General appearance: alert, cooperative and no distress  Lab Results:   Recent Labs  07/08/13 2145 07/09/13 1230  WBC 12.8* 16.5*  HGB 11.3* 11.6*  HCT 34.2* 35.1*  PLT 322 286   BMET  Recent Labs  07/08/13 2145 07/09/13 1230  NA 139 138  K 4.4 4.4  CL 102 101  CO2 27 28  GLUCOSE 128* 131*  BUN 19 19  CREATININE 0.77 0.72  CALCIUM 8.3* 8.3*   PT/INR No results found for this basename: LABPROT, INR,  in the last 72 hours ABG No results found for this basename: PHART, PCO2, PO2, HCO3,  in the last 72 hours  Studies/Results: Dg Swallowing Func-speech Pathology  07/09/2013   Lenor DerrickLori T Willis, CCC-SLP     07/09/2013  3:06 PM Objective Swallowing Evaluation: Modified Barium Swallowing Study   Patient Details  Name: Jason [name] MRN: [id number] Date of Birth: 09/04/1929  Today's Date: 07/09/2013 Time: 1330-1420 SLP Time Calculation (min): 50 min  Past Medical History:  Past Medical History  Diagnosis Date  . Arthritis   . Insomnia     takes Trazodone nightly  . PONV (postoperative nausea and vomiting)   . Hyperlipidemia     was on medication but has been off for a while  . Dizziness     was taking Meclizine but doesn't take now;found out that HR was  45  . Joint pain   . Joint swelling   . Itchy skin     scalp and uses a cream  . H/O hiatal hernia     takes Omeprazole daily  .  Hemorrhoids   . Constipation   . History of colon polyps   . History of colitis     many yrs ago  . Urinary frequency   . History of kidney stones     passed on his own  . Enlarged prostate   . Anxiety     takes Diazepam daily prn  . Depression     takes Trazodone nightly  . Insomnia     takes Trazodone nightly   Past Surgical History:  Past Surgical History  Procedure Laterality Date  . Right knee arthroscopy    . Hernia repair      double  . Hemorrhoidectomy with hemorrhoid banding    . Tonsillectomy    . Bilateral cataract surgery    . Circumcision    . Colonoscopy    . Esophagogastroduodenoscopy    . Ear cyst excision N/A 04/05/2013    Procedure: CYST REMOVAL;  Surgeon: Melvenia BeamMitchell Gore, MD;   [facility];  Service: ENT;  Laterality: N/A;  . Septoplasty N/A 04/05/2013    Procedure: SEPTOPLASTY;  Surgeon: Melvenia BeamMitchell Gore, MD;  [facility];  Service: ENT;  Laterality: N/A;  . Direct laryngoscopy N/A 06/26/2013    Procedure: DIRECT LARYNGOSCOPY;  Surgeon: Christia Readingwight Robynn Marcel, MD;   [facility];  Service: ENT;  Laterality: N/A;  . Sinus endo w/fusion N/A 07/03/2013    Procedure: ENDOSCOPIC SINUS SURGERY WITH FUSION NAVIGATION with  By-Nasopharongeal Biopsy;  Surgeon: Christia Reading, MD;  Location:  [facility] OR;  Service: ENT;  Laterality: N/A;   HPI:  Chief Complaint: left nasopharynx mass, left tongue and palate  weakness     Assessment / Plan / Recommendation Clinical Impression  Dysphagia Diagnosis: Mild oral phase dysphagia;Moderate  pharyngeal phase dysphagia Clinical impression: Patient presents with a mild oral and  moderate sensori-motor pharyngeal dysphagia, characterized by  lingual weakness and decresed ability to manipulate solid  boluses, delayed swallow initiation, decreased base of tongue  contraction to the pharyngeal wall, decreased velopharyngeal  closure, decreased anterior excursion and elevation of the hyoid  and decreased laryngeal elevation.  These deficits result in  severe pooling/residue with all  consistencies in the valleculae  and pyriform sinuses.  However, when turning head to left and  swallowing twice with each bite/sip, there was minimal laryngeal  residue.  Pt. did not aspirate during this study, but is at risk  for aspiration if he is unable to prevent or clear laryngeal  residue.  Pt., wife, and grand-daughter were educated  extensively, and case was discussed with Dr. Jenne Pane, RN, and  nutritionist.  MD ordered d/c of Panda tube with hopes of  adequate po intake so that PEG might be avoided.      Treatment Recommendation  Therapy as outlined in treatment plan below    Diet Recommendation Dysphagia 1 (Puree);Thin liquid   Liquid Administration via: Cup;No straw Medication Administration: Whole meds with puree Supervision: Staff to assist with self feeding;Full  supervision/cueing for compensatory strategies Compensations: Slow rate;Small sips/bites;Multiple dry swallows  after each bite/sip Postural Changes and/or Swallow Maneuvers: Out of bed for  meals;Seated upright 90 degrees;Head turn left during swallow    Other  Recommendations Oral Care Recommendations: Oral care  BID;Staff/trained caregiver to provide oral care Other Recommendations: Have oral suction available;Clarify  dietary restrictions   Follow Up Recommendations       Frequency and Duration min 2x/week  2 weeks   Pertinent Vitals/Pain afebrile    SLP Swallow Goals  see care plan   General HPI: Chief Complaint: left nasopharynx mass, left tongue  and palate weakness Type of Study: Modified Barium Swallowing Study Reason for Referral: Objectively evaluate swallowing function Previous Swallow Assessment: BSE this am Diet Prior to this Study: Thin liquids Temperature Spikes Noted: No Respiratory Status: Room air History of Recent Intubation: Yes Length of Intubations (days): 1 days Date extubated: 06/26/13 Behavior/Cognition: Alert;Cooperative Oral Cavity - Dentition: Adequate natural dentition Oral Motor / Sensory Function: Impaired - see  Bedside swallow  eval Self-Feeding Abilities: Able to feed self Patient Positioning: Upright in chair Baseline Vocal Quality: Clear;Low vocal intensity Volitional Cough: Weak Volitional Swallow: Able to elicit Pharyngeal Secretions: Not observed secondary MBS    Reason for Referral Objectively evaluate swallowing function   Oral Phase Oral Preparation/Oral Phase Oral Phase: Impaired Oral - Solids Oral - Regular: Impaired mastication;Weak lingual  manipulation;Incomplete tongue to palate contact;Delayed oral  transit   Pharyngeal Phase Pharyngeal Phase Pharyngeal Phase: Impaired Pharyngeal - Nectar Pharyngeal - Nectar Cup: Premature spillage to valleculae;Reduced  pharyngeal peristalsis;Reduced epiglottic inversion;Reduced  anterior laryngeal mobility;Reduced laryngeal elevation;Reduced  tongue base retraction;Pharyngeal residue - valleculae;Pharyngeal  residue - pyriform sinuses;Pharyngeal residue - posterior  pharnyx;Compensatory strategies attempted (Comment);Delayed  swallow initiation (chin tuck not effective; Better with head  turn to  left!) Pharyngeal - Thin Pharyngeal - Thin Cup: Premature spillage to valleculae;Reduced  pharyngeal peristalsis;Reduced epiglottic inversion;Reduced  anterior laryngeal mobility;Reduced laryngeal elevation;Reduced  tongue base retraction;Pharyngeal residue - valleculae;Pharyngeal  residue - pyriform sinuses;Pharyngeal residue - posterior  pharnyx;Compensatory strategies attempted (Comment);Delayed  swallow initiation Pharyngeal - Solids Pharyngeal - Puree: Premature spillage to valleculae;Reduced  pharyngeal peristalsis;Reduced epiglottic inversion;Reduced  anterior laryngeal mobility;Reduced laryngeal elevation;Reduced  tongue base retraction;Pharyngeal residue - valleculae;Pharyngeal  residue - pyriform sinuses;Pharyngeal residue - posterior  pharnyx;Compensatory strategies attempted (Comment);Delayed  swallow initiation  Cervical Esophageal Phase    GO    Cervical Esophageal  Phase Cervical Esophageal Phase: Vicente Masson T 07/09/2013, 3:06 PM     Anti-infectives: Anti-infectives   Start     Dose/Rate Route Frequency Ordered Stop   07/08/13 1230  ceFEPIme (MAXIPIME) 2 g in dextrose 5 % 50 mL IVPB     2 g 100 mL/hr over 30 Minutes Intravenous Every 24 hours 07/08/13 1220     07/05/13 1400  ciprofloxacin (CIPRO) IVPB 400 mg  Status:  Discontinued     400 mg 200 mL/hr over 60 Minutes Intravenous Every 12 hours 07/05/13 1257 07/08/13 1158   07/01/13 1800  Ampicillin-Sulbactam (UNASYN) 3 g in sodium chloride 0.9 % 100 mL IVPB     3 g 100 mL/hr over 60 Minutes Intravenous Every 6 hours 07/01/13 1731 07/02/13 1359      Assessment/Plan: Skull base osteomyelitis with inflammatory pseudotumor, left hypoglossal and glossopharyngeal palsy, dysphagia, dysarthria, headache, left serous otitis media s/p Procedure(s): ENDOSCOPIC SINUS SURGERY WITH FUSION NAVIGATION with By-Nasopharongeal Biopsy (N/A) Calorie count to see if he can maintain caloric intake enough to avoid PEG tube.  If not, PEG will be placed.  Continue PT/OT.  Disposition still to be determined, SNF versus home health.  Appreciate ID input.  Will back down IVF.  LOS: 9 days    Jolanda Mccann 07/10/2013

## 2013-07-10 NOTE — Progress Notes (Signed)
Calorie Count Note  48 hour calorie count ordered.  Diet: Dysphagia 1, thin liquids Supplements: Ensure Complete BID  Breakfast: 85 kcal, 3 grams protein Lunch: 96 kcal, 3 grams protein Dinner: 162 kcal, 6 grams protein Supplements: 438 kcal, 16 grams protein  Total intake: 728 kcal (47% of minimum estimated needs)  28 grams protein (35% of minimum estimated needs)  Pt reports getting full and fatigued after eating. He states the appearance and texture of food is good. He is drinking some Ensure supplements. Ongoing poor appetite. Per wife pt has always eaten fairly small amounts and he has never weighed more than 155 lbs.   Nutrition Dx: Inadequate oral intake related to dysphagia, lethargy as evidenced by pt's report; ongoing  Goal: Pt to meet >/= 90% of their estimated nutrition needs; not met  Intervention: Increase Ensure Complete to TID Provide Resource Breeze with breakfast Encourage PO intake  Pryor Ochoa RD, LDN Inpatient Clinical Dietitian Pager: [phone] After Hours Pager: [phone]

## 2013-07-11 ENCOUNTER — Encounter (HOSPITAL_COMMUNITY)
Admission: AD | Disposition: A | Discharge status: Home Health | Payer: Self-pay | Source: Ambulatory Visit | Attending: Otolaryngology

## 2013-07-11 HISTORY — PX: SINUS EXPLORATION: SHX5214

## 2013-07-11 LAB — GLUCOSE, CAPILLARY
Glucose-Capillary: 101 mg/dL — ABNORMAL HIGH (ref 70–99)
Glucose-Capillary: 123 mg/dL — ABNORMAL HIGH (ref 70–99)
Glucose-Capillary: 166 mg/dL — ABNORMAL HIGH (ref 70–99)
Glucose-Capillary: 142 mg/dL — ABNORMAL HIGH (ref 70–99)

## 2013-07-11 SURGERY — SINUS EXPLORATION
Anesthesia: Topical | Site: Nose | Laterality: Bilateral

## 2013-07-11 MED ORDER — LIDOCAINE-EPINEPHRINE 1 %-1:100000 IJ SOLN
INTRAMUSCULAR | Status: DC | PRN
  Administered 2013-07-11: 1 mL

## 2013-07-11 MED ORDER — OXYMETAZOLINE HCL 0.05 % NA SOLN
NASAL | Status: DC | PRN
  Administered 2013-07-11: 1 via NASAL

## 2013-07-11 MED ORDER — ACETAMINOPHEN 80 MG PO CHEW
320.0000 mg | CHEWABLE_TABLET | ORAL | Status: DC | PRN
  Administered 2013-07-12: 320 mg via ORAL
  Filled 2013-07-11 (×2): qty 4

## 2013-07-11 MED ORDER — ENSURE COMPLETE PO LIQD
237.0000 mL | Freq: Two times a day (BID) | ORAL | Status: DC
  Administered 2013-07-11 – 2013-07-14 (×6): 237 mL via ORAL

## 2013-07-11 MED ORDER — LIDOCAINE-EPINEPHRINE 1 %-1:100000 IJ SOLN
INTRAMUSCULAR | Status: AC
  Filled 2013-07-11: qty 1

## 2013-07-11 MED ORDER — SODIUM CHLORIDE 0.9 % IR SOLN
Status: DC | PRN
  Administered 2013-07-11: 1000 mL

## 2013-07-11 MED ORDER — OXYMETAZOLINE HCL 0.05 % NA SOLN
NASAL | Status: AC
  Filled 2013-07-11: qty 15

## 2013-07-11 MED ORDER — BOOST / RESOURCE BREEZE PO LIQD
1.0000 | Freq: Two times a day (BID) | ORAL | Status: DC
  Administered 2013-07-11 – 2013-07-14 (×7): 1 via ORAL

## 2013-07-11 SURGICAL SUPPLY — 11 items
DRAPE PROXIMA HALF (DRAPES) ×9 IMPLANT
GLOVE BIO SURGEON STRL SZ7.5 (GLOVE) ×3 IMPLANT
GOWN STRL NON-REIN LRG LVL3 (GOWN DISPOSABLE) ×3 IMPLANT
NS IRRIG 1000ML POUR BTL (IV SOLUTION) ×3 IMPLANT
PAD ARMBOARD 7.5X6 YLW CONV (MISCELLANEOUS) ×6 IMPLANT
PATTIES SURGICAL .5 X3 (DISPOSABLE) ×3 IMPLANT
SOLUTION ANTI FOG 6CC (MISCELLANEOUS) ×6 IMPLANT
TOWEL OR 17X24 6PK STRL BLUE (TOWEL DISPOSABLE) ×3 IMPLANT
TOWEL OR 17X26 10 PK STRL BLUE (TOWEL DISPOSABLE) ×6 IMPLANT
TUBE CONNECTING 12'X1/4 (SUCTIONS) ×1
TUBE CONNECTING 12X1/4 (SUCTIONS) ×2 IMPLANT

## 2013-07-11 NOTE — Care Management Note (Signed)
  Page 1 of 1   07/11/2013     10:41:13 AM   CARE MANAGEMENT NOTE 07/11/2013  Patient:  Jason [name],Jason [name]   Account Number:  [id number]  Date Initiated:  07/11/2013  Documentation initiated by:  Jason FlurryWILE,Laisha Rau  Subjective/Objective Assessment:     Action/Plan:   Anticipated DC Date:     Anticipated DC Plan:           Choice offered to / List presented to:             Status of service:   Medicare Important Message given?   (If response is "NO", the following Medicare IM given date fields will be blank) Date Medicare IM given:   Date Additional Medicare IM given:    Discharge Disposition:    Per UR Regulation:    If discussed at Long Length of Stay Meetings, dates discussed:    Comments:  07-11-13 Spoke with patient and his wife at bedside . At present both are unsure regarding discharge plan ( HH vs SNF ) , patinet not feeling well this am. [place] . If [place] do go [facility] with [facility]  [name] and will [facility] .  Jason FlurryHeather Bexley Laubach RN BSN [phone]

## 2013-07-11 NOTE — Procedures (Signed)
Preop diagnosis: Skull base osteomyelitis, headache Postop diagnosis: same Procedure: Nasal endoscopy with debridement Surgeon: Jenne PaneBates Anesth: Topical with lidocaine and Afrin Compl: None Findings: Nasopharynx defect with crusting and underlying purulent mucus.  Debris removed.  Mucosa with mild inflammation, improved since surgery. Description: After discussing risks, benefits, and alternatives, the nasal passages were packed with pledgets soaked in Afrin and lidocaine.  The pledgets were removed after a couple of minutes.  A zero degree telescope was passed through each nasal passage and crusting and purulent mucus were removed with forceps and suction.  The scope was removed and the patient was returned to nursing care in stable condition.  He tolerated the procedure well.

## 2013-07-11 NOTE — Progress Notes (Addendum)
Speech Language Pathology Treatment: Dysphagia  Patient Details Name: Jason [name] MRN: [id number] DOB: Oct 19, 1929 Today's Date: 07/11/2013 Time: 1340-1400 SLP Time Calculation (min): 20 min  Assessment / Plan / Recommendation Clinical Impression  Treatment focused on dysphagia with wife at bedside.  Pt.'s headache returned this morning with pain meds on given by RN.  He stated swallow strategies accurately without cues needed.  Once he turned head to left and extended head as well and was given min verbal cue to turn head but not extend.  Signs of decreased airway protection observed with tea but not the Ensure or puree (slightly thicker viscosity).  Question safety of thickening liquids to nectar with presence of pharyngeal residue.  SLP will continue to see.   HPI HPI: 80 year old male with three weeks or more of left-sided headaches.  Early this week, tongue movement became abnormal and he went to the ER.  A head CT demonstrated fullness in the left nasopharynx which corresponded to an irregular fullness seen on endoscopy subsequently at the office.  He presents for biopsy.  Found to have Skull base osteomyelitis with inflammatory pseudotumor   Pertinent Vitals WDL  SLP Plan  Continue with current plan of care    Recommendations Diet recommendations: Dysphagia 1 (puree);Thin liquid Liquids provided via: Cup;No straw Medication Administration: Whole meds with puree Supervision: Staff to assist with self feeding;Full supervision/cueing for compensatory strategies Compensations: Slow rate;Small sips/bites;Multiple dry swallows after each bite/sip Postural Changes and/or Swallow Maneuvers: Out of bed for meals;Seated upright 90 degrees;Head turn left during swallow              Oral Care Recommendations: Oral care BID;Staff/trained caregiver to provide oral care Follow up Recommendations:  (TBD) Plan: Continue with current plan of care    GO     Royce MacadamiaLisa Willis Damian Buckles M.Ed  [employer]-SLP Pager [phone]  07/11/2013

## 2013-07-11 NOTE — Progress Notes (Signed)
8 Days Post-Op  Subjective: Since yesterday, left head pain has been worse again, much like it was at admission.  He has required more pain medication.  Calorie count results noted.  Objective: Vital signs in last 24 hours: Temp:  [97.5 F (36.4 C)-98.1 F (36.7 C)] 97.7 F (36.5 C) (01/15 0602) Pulse Rate:  [60-75] 60 (01/15 0602) Resp:  [16] 16 (01/15 0602) BP: (121-151)/(61-65) 151/61 mmHg (01/15 0602) SpO2:  [96 %-100 %] 100 % (01/15 0602) Last BM Date: 07/09/13  Intake/Output from previous day: 01/14 0701 - 01/15 0700 In: 800 [I.V.:800] Out: 3850 [Urine:3850] Intake/Output this shift:    General appearance: alert, cooperative and no distress  Lab Results:   Recent Labs  07/08/13 2145 07/09/13 1230  WBC 12.8* 16.5*  HGB 11.3* 11.6*  HCT 34.2* 35.1*  PLT 322 286   BMET  Recent Labs  07/08/13 2145 07/09/13 1230  NA 139 138  K 4.4 4.4  CL 102 101  CO2 27 28  GLUCOSE 128* 131*  BUN 19 19  CREATININE 0.77 0.72  CALCIUM 8.3* 8.3*   PT/INR No results found for this basename: LABPROT, INR,  in the last 72 hours ABG No results found for this basename: PHART, PCO2, PO2, HCO3,  in the last 72 hours  Studies/Results: Dg Swallowing Func-speech Pathology  07/09/2013   Lenor DerrickLori T Willis, CCC-SLP     07/09/2013  3:06 PM Objective Swallowing Evaluation: Modified Barium Swallowing Study   Patient Details  Name: Jason [name] MRN: [id number] Date of Birth: May 07, 1930  Today's Date: 07/09/2013 Time: 1330-1420 SLP Time Calculation (min): 50 min  Past Medical History:  Past Medical History  Diagnosis Date  . Arthritis   . Insomnia     takes Trazodone nightly  . PONV (postoperative nausea and vomiting)   . Hyperlipidemia     was on medication but has been off for a while  . Dizziness     was taking Meclizine but doesn't take now;found out that HR was  45  . Joint pain   . Joint swelling   . Itchy skin     scalp and uses a cream  . H/O hiatal hernia     takes Omeprazole daily  .  Hemorrhoids   . Constipation   . History of colon polyps   . History of colitis     many yrs ago  . Urinary frequency   . History of kidney stones     passed on his own  . Enlarged prostate   . Anxiety     takes Diazepam daily prn  . Depression     takes Trazodone nightly  . Insomnia     takes Trazodone nightly   Past Surgical History:  Past Surgical History  Procedure Laterality Date  . Right knee arthroscopy    . Hernia repair      double  . Hemorrhoidectomy with hemorrhoid banding    . Tonsillectomy    . Bilateral cataract surgery    . Circumcision    . Colonoscopy    . Esophagogastroduodenoscopy    . Ear cyst excision N/A 04/05/2013    Procedure: CYST REMOVAL;  Surgeon: Melvenia BeamMitchell Gore, MD;   [facility];  Service: ENT;  Laterality: N/A;  . Septoplasty N/A 04/05/2013    Procedure: SEPTOPLASTY;  Surgeon: Melvenia BeamMitchell Gore, MD;  [facility];  Service: ENT;  Laterality: N/A;  . Direct laryngoscopy N/A 06/26/2013    Procedure: DIRECT LARYNGOSCOPY;  Surgeon: Karren Burlywight  Jenne Pane, MD;   [facility];  Service: ENT;  Laterality: N/A;  . Sinus endo w/fusion N/A 07/03/2013    Procedure: ENDOSCOPIC SINUS SURGERY WITH FUSION NAVIGATION with  By-Nasopharongeal Biopsy;  Surgeon: Christia Reading, MD;  Location:  [facility] OR;  Service: ENT;  Laterality: N/A;   HPI:  Chief Complaint: left nasopharynx mass, left tongue and palate  weakness     Assessment / Plan / Recommendation Clinical Impression  Dysphagia Diagnosis: Mild oral phase dysphagia;Moderate  pharyngeal phase dysphagia Clinical impression: Patient presents with a mild oral and  moderate sensori-motor pharyngeal dysphagia, characterized by  lingual weakness and decresed ability to manipulate solid  boluses, delayed swallow initiation, decreased base of tongue  contraction to the pharyngeal wall, decreased velopharyngeal  closure, decreased anterior excursion and elevation of the hyoid  and decreased laryngeal elevation.  These deficits result in  severe pooling/residue with all  consistencies in the valleculae  and pyriform sinuses.  However, when turning head to left and  swallowing twice with each bite/sip, there was minimal laryngeal  residue.  Pt. did not aspirate during this study, but is at risk  for aspiration if he is unable to prevent or clear laryngeal  residue.  Pt., wife, and grand-daughter were educated  extensively, and case was discussed with Dr. Jenne Pane, RN, and  nutritionist.  MD ordered d/c of Panda tube with hopes of  adequate po intake so that PEG might be avoided.      Treatment Recommendation  Therapy as outlined in treatment plan below    Diet Recommendation Dysphagia 1 (Puree);Thin liquid   Liquid Administration via: Cup;No straw Medication Administration: Whole meds with puree Supervision: Staff to assist with self feeding;Full  supervision/cueing for compensatory strategies Compensations: Slow rate;Small sips/bites;Multiple dry swallows  after each bite/sip Postural Changes and/or Swallow Maneuvers: Out of bed for  meals;Seated upright 90 degrees;Head turn left during swallow    Other  Recommendations Oral Care Recommendations: Oral care  BID;Staff/trained caregiver to provide oral care Other Recommendations: Have oral suction available;Clarify  dietary restrictions   Follow Up Recommendations       Frequency and Duration min 2x/week  2 weeks   Pertinent Vitals/Pain afebrile    SLP Swallow Goals  see care plan   General HPI: Chief Complaint: left nasopharynx mass, left tongue  and palate weakness Type of Study: Modified Barium Swallowing Study Reason for Referral: Objectively evaluate swallowing function Previous Swallow Assessment: BSE this am Diet Prior to this Study: Thin liquids Temperature Spikes Noted: No Respiratory Status: Room air History of Recent Intubation: Yes Length of Intubations (days): 1 days Date extubated: 06/26/13 Behavior/Cognition: Alert;Cooperative Oral Cavity - Dentition: Adequate natural dentition Oral Motor / Sensory Function: Impaired - see  Bedside swallow  eval Self-Feeding Abilities: Able to feed self Patient Positioning: Upright in chair Baseline Vocal Quality: Clear;Low vocal intensity Volitional Cough: Weak Volitional Swallow: Able to elicit Pharyngeal Secretions: Not observed secondary MBS    Reason for Referral Objectively evaluate swallowing function   Oral Phase Oral Preparation/Oral Phase Oral Phase: Impaired Oral - Solids Oral - Regular: Impaired mastication;Weak lingual  manipulation;Incomplete tongue to palate contact;Delayed oral  transit   Pharyngeal Phase Pharyngeal Phase Pharyngeal Phase: Impaired Pharyngeal - Nectar Pharyngeal - Nectar Cup: Premature spillage to valleculae;Reduced  pharyngeal peristalsis;Reduced epiglottic inversion;Reduced  anterior laryngeal mobility;Reduced laryngeal elevation;Reduced  tongue base retraction;Pharyngeal residue - valleculae;Pharyngeal  residue - pyriform sinuses;Pharyngeal residue - posterior  pharnyx;Compensatory strategies attempted (Comment);Delayed  swallow initiation (chin tuck not effective;  Better with head  turn to left!) Pharyngeal - Thin Pharyngeal - Thin Cup: Premature spillage to valleculae;Reduced  pharyngeal peristalsis;Reduced epiglottic inversion;Reduced  anterior laryngeal mobility;Reduced laryngeal elevation;Reduced  tongue base retraction;Pharyngeal residue - valleculae;Pharyngeal  residue - pyriform sinuses;Pharyngeal residue - posterior  pharnyx;Compensatory strategies attempted (Comment);Delayed  swallow initiation Pharyngeal - Solids Pharyngeal - Puree: Premature spillage to valleculae;Reduced  pharyngeal peristalsis;Reduced epiglottic inversion;Reduced  anterior laryngeal mobility;Reduced laryngeal elevation;Reduced  tongue base retraction;Pharyngeal residue - valleculae;Pharyngeal  residue - pyriform sinuses;Pharyngeal residue - posterior  pharnyx;Compensatory strategies attempted (Comment);Delayed  swallow initiation  Cervical Esophageal Phase    GO    Cervical Esophageal  Phase Cervical Esophageal Phase: Vicente Masson T 07/09/2013, 3:06 PM     Anti-infectives: Anti-infectives   Start     Dose/Rate Route Frequency Ordered Stop   07/08/13 1230  ceFEPIme (MAXIPIME) 2 g in dextrose 5 % 50 mL IVPB     2 g 100 mL/hr over 30 Minutes Intravenous Every 24 hours 07/08/13 1220     07/05/13 1400  ciprofloxacin (CIPRO) IVPB 400 mg  Status:  Discontinued     400 mg 200 mL/hr over 60 Minutes Intravenous Every 12 hours 07/05/13 1257 07/08/13 1158   07/01/13 1800  Ampicillin-Sulbactam (UNASYN) 3 g in sodium chloride 0.9 % 100 mL IVPB     3 g 100 mL/hr over 60 Minutes Intravenous Every 6 hours 07/01/13 1731 07/02/13 1359      Assessment/Plan: s/p Procedure(s): ENDOSCOPIC SINUS SURGERY WITH FUSION NAVIGATION with By-Nasopharongeal Biopsy (N/A) The increase in pain is concerning.  I recommended nasal endscopy to assess the nasopharynx and potentially suction and debride and suction out the surgical site.  It could be that his increase in symptoms may be related to accumulation of crusting and pus.  This will be performed at the bedside later today.  I think we will continue the same antibiotic for now though the wife wonders if the change to cefepime may have resulted in his worsening.  Reimaging may be considered depending on endoscopy results.  Calorie count continues with modifications to his Ensure dosing.  Still may need PEG tube.  LOS: 10 days    Lumen Brinlee 07/11/2013

## 2013-07-11 NOTE — Progress Notes (Signed)
Calorie Count Note  48 hour calorie count ordered and now complete  Diet: Dysphagia 1 Supplements: Ensure Complete, Resource Breeze  Breakfast: 40 kcal, 2 grams protein Lunch: 114 kcal, 8 grams protein Dinner: 60 kcal, 4 grams protein Supplements: 430 kcal, 17 grams protein  Total intake: 644 kcal (42% of minimum estimated needs)  31 grams protein (39% of minimum estimated needs)  Pt asleep at time of visit. Per family in the room pt is feeling worse today with headaches and neck pain. PO intake has not improved. Per MD note, possible nasal endscopy to assess the nasopharynx and potentially suction and debride and suction out the surgical site.  Pt may still need PEG.  Will continue to monitor.  Nutrition Dx: Inadequate oral intake related to dysphagia, lethargy as evidenced by pt's report; ongoing  Goal: Pt to meet >/= 90% of their estimated nutrition needs; not met  Intervention: Continue Ensure Complete BID Provide Resource Breeze BID If no improvement in PO intake after endoscopy, recommend replacing NGT and re-starting TF.   Pryor Ochoa RD, LDN Inpatient Clinical Dietitian Pager: [phone] After Hours Pager: [phone]

## 2013-07-11 NOTE — Progress Notes (Signed)
ANTIBIOTIC CONSULT NOTE - follow up  Pharmacy Consult for cefepime Indication: skull osteomyelitis, cultures positive for serratia  Allergies  Allergen Reactions  . Bee Pollen Anaphylaxis  . Tetanus Toxoids Swelling    Tetanus Shot  . Hydrocodone Other (See Comments)    Confusion, "talking out of his head"    Patient Measurements: Height: 5\' 3"  (160 cm) Weight: 145 lb 8 oz (65.998 kg) IBW/kg (Calculated) : 56.9   Vital Signs: Temp: 97.7 F (36.5 C) (01/15 0602) Temp src: Oral (01/15 0602) BP: 151/61 mmHg (01/15 0602) Pulse Rate: 60 (01/15 0602) Intake/Output from previous day: 01/14 0701 - 01/15 0700 In: 800 [I.V.:800] Out: 3850 [Urine:3850] Intake/Output from this shift:    Labs:  Recent Labs  07/08/13 2145 07/09/13 1230  WBC 12.8* 16.5*  HGB 11.3* 11.6*  PLT 322 286  CREATININE 0.77 0.72   Estimated Creatinine Clearance: 58.3 ml/min (by C-G formula based on Cr of 0.72). No results found for this basename: VANCOTROUGH, Leodis Binet, VANCORANDOM, GENTTROUGH, GENTPEAK, GENTRANDOM, TOBRATROUGH, TOBRAPEAK, TOBRARND, AMIKACINPEAK, AMIKACINTROU, AMIKACIN,  in the last 72 hours   Microbiology: Recent Results (from the past 720 hour(s))  SURGICAL PCR SCREEN     Status: None   Collection Time    07/03/13  6:06 AM      Result Value Range Status   MRSA, PCR NEGATIVE  NEGATIVE Final   Staphylococcus aureus NEGATIVE  NEGATIVE Final   Comment:            The Xpert SA Assay (FDA     approved for NASAL specimens     in patients over 27 years of age),     is one component of     a comprehensive surveillance     program.  Test performance has     been validated by [employer] for patients greater     than or equal to 98 year old.     It is not intended     to diagnose infection nor to     guide or monitor treatment.  ANAEROBIC CULTURE     Status: None   Collection Time    07/03/13  2:50 PM      Result Value Range Status   Specimen Description ABSCESS   Final   Special Requests LEFT NASOPHARYNAL MASS   Final   Gram Stain     Final   Value: MODERATE WBC PRESENT,BOTH PMN AND MONONUCLEAR     NO SQUAMOUS EPITHELIAL CELLS SEEN     MODERATE GRAM POSITIVE COCCI     IN PAIRS     Performed at [facility]   Culture     Final   Value: NO ANAEROBES ISOLATED     Performed at [facility]   Report Status 07/08/2013 FINAL   Final  CULTURE, ROUTINE-ABSCESS     Status: None   Collection Time    07/03/13  2:50 PM      Result Value Range Status   Specimen Description ABSCESS   Final   Special Requests LEFT NASOPHARYNAL MASS   Final   Gram Stain     Final   Value: ABUNDANT WBC PRESENT,BOTH PMN AND MONONUCLEAR     NO SQUAMOUS EPITHELIAL CELLS SEEN     MODERATE GRAM POSITIVE COCCI     IN PAIRS   Culture FEW SERRATIA MARCESCENS   Final   Report Status 07/06/2013 FINAL   Final   Organism ID, Bacteria SERRATIA MARCESCENS  Final  TISSUE CULTURE     Status: None   Collection Time    07/03/13  3:47 PM      Result Value Range Status   Specimen Description TISSUE LEFT NASOPHARYNGEAL   Final   Special Requests NONE   Final   Gram Stain     Final   Value: NO WBC SEEN     NO SQUAMOUS EPITHELIAL CELLS SEEN     NO ORGANISMS SEEN     Performed at [facility]   Culture     Final   Value: NO GROWTH 3 DAYS     Performed at [facility]   Report Status 07/07/2013 FINAL   Final  AFB CULTURE WITH SMEAR     Status: None   Collection Time    07/03/13  3:47 PM      Result Value Range Status   Specimen Description TISSUE LEFT NASOPHARYNGEAL   Final   Special Requests NONE   Final   ACID FAST SMEAR     Final   Value: NO ACID FAST BACILLI SEEN     Performed at [facility]   Culture     Final   Value: CULTURE WILL BE EXAMINED FOR 6 WEEKS BEFORE ISSUING A FINAL REPORT     Performed at [facility]   Report Status PENDING   Incomplete  ANAEROBIC CULTURE     Status: None   Collection Time    07/03/13  3:47 PM       Result Value Range Status   Specimen Description TISSUE LEFT NASOPHARYNGEAL   Final   Special Requests NONE   Final   Gram Stain     Final   Value: NO WBC SEEN     NO SQUAMOUS EPITHELIAL CELLS SEEN     NO ORGANISMS SEEN     Performed at [facility]   Culture     Final   Value: NO ANAEROBES ISOLATED     Performed at [facility]   Report Status 07/08/2013 FINAL   Final  FUNGUS CULTURE W SMEAR     Status: None   Collection Time    07/03/13  3:47 PM      Result Value Range Status   Specimen Description TISSUE LEFT NASOPHARYNGEAL   Final   Special Requests NONE   Final   Fungal Smear     Final   Value: NO YEAST OR FUNGAL ELEMENTS SEEN     Performed at [facility]   Culture     Final   Value: CULTURE IN PROGRESS FOR FOUR WEEKS     Performed at [facility]   Report Status PENDING   Incomplete  URINE CULTURE     Status: None   Collection Time    07/04/13 12:27 AM      Result Value Range Status   Specimen Description URINE, CLEAN CATCH   Final   Special Requests unasyn Normal   Final   Culture  Setup Time     Final   Value: 07/04/2013 09:27     Performed at [facility] Count     Final   Value: NO GROWTH     Performed at [facility]   Culture     Final   Value: NO GROWTH     Performed at [facility]   Report Status 07/05/2013 FINAL   Final    Medical History: Past Medical  History  Diagnosis Date  . Arthritis   . Insomnia     takes Trazodone nightly  . PONV (postoperative nausea and vomiting)   . Hyperlipidemia     was on medication but has been off for a while  . Dizziness     was taking Meclizine but doesn't take now;found out that HR was 45  . Joint pain   . Joint swelling   . Itchy skin     scalp and uses a cream  . H/O hiatal hernia     takes Omeprazole daily  . Hemorrhoids   . Constipation   . History of colon polyps   . History of colitis     many yrs ago  . Urinary frequency   .  History of kidney stones     passed on his own  . Enlarged prostate   . Anxiety     takes Diazepam daily prn  . Depression     takes Trazodone nightly  . Insomnia     takes Trazodone nightly    Medications:  Prescriptions prior to admission  Medication Sig Dispense Refill  . betamethasone dipropionate (DIPROLENE) 0.05 % cream Apply 1 application topically daily as needed (itchy scalp).      . diazepam (VALIUM) 5 MG tablet Take 2.5 mg by mouth daily as needed for anxiety.       Marland Kitchen. HYDROcodone-acetaminophen (NORCO/VICODIN) 5-325 MG per tablet Take 1 tablet by mouth every 6 (six) hours as needed for moderate pain.      . meloxicam (MOBIC) 15 MG tablet Take 15 mg by mouth daily.      . predniSONE (DELTASONE) 20 MG tablet Take 2 tablets (40 mg total) by mouth daily.  12 tablet  0  . traMADol (ULTRAM) 50 MG tablet Take 50 mg by mouth every 6 (six) hours as needed.       Assessment: 80 yo man who was previously on cipro to cont cefepime for serratia osteomyelitis. He has had PICC line placed with plan for Abx for 6 weeks.   Goal of Therapy:  Eradication of infection  Plan:  Cefepime 2gm IV q24 hours. F/u clinical course and renal function  Kamori Barbier Poteet 07/11/2013,11:00 AM

## 2013-07-12 ENCOUNTER — Encounter (HOSPITAL_COMMUNITY): Payer: Self-pay | Admitting: Otolaryngology

## 2013-07-12 DIAGNOSIS — R1310 Dysphagia, unspecified: Secondary | ICD-10-CM

## 2013-07-12 LAB — GLUCOSE, CAPILLARY
GLUCOSE-CAPILLARY: 134 mg/dL — AB (ref 70–99)
GLUCOSE-CAPILLARY: 143 mg/dL — AB (ref 70–99)
GLUCOSE-CAPILLARY: 155 mg/dL — AB (ref 70–99)
Glucose-Capillary: 119 mg/dL — ABNORMAL HIGH (ref 70–99)
Glucose-Capillary: 118 mg/dL — ABNORMAL HIGH (ref 70–99)

## 2013-07-12 MED ORDER — DEXTROSE 5 % IV SOLN
2.0000 g | Freq: Two times a day (BID) | INTRAVENOUS | Status: DC
  Administered 2013-07-12 – 2013-07-18 (×12): 2 g via INTRAVENOUS
  Filled 2013-07-12 (×14): qty 2

## 2013-07-12 NOTE — Progress Notes (Signed)
Agree with PTA.    Earsel Shouse, PT [phone]  

## 2013-07-12 NOTE — Progress Notes (Signed)
ANTIBIOTIC CONSULT NOTE - follow up  Pharmacy Consult for cefepime Indication: skull osteomyelitis, cultures positive for serratia  Allergies  Allergen Reactions  . Bee Pollen Anaphylaxis  . Tetanus Toxoids Swelling    Tetanus Shot  . Hydrocodone Other (See Comments)    Confusion, "talking out of his head"    Patient Measurements: Height: 5\' 3"  (160 cm) Weight: 145 lb 8 oz (65.998 kg) IBW/kg (Calculated) : 56.9   Vital Signs: Temp: 97.8 F (36.6 C) (01/16 0543) Temp src: Oral (01/16 0543) BP: 111/54 mmHg (01/16 0543) Pulse Rate: 62 (01/16 0543) Intake/Output from previous day: 01/15 0701 - 01/16 0700 In: 2078.2 [I.V.:1978.2; IV Piggyback:100] Out: 2050 [Urine:2050] Intake/Output from this shift:    Labs:  Recent Labs  07/09/13 1230  WBC 16.5*  HGB 11.6*  PLT 286  CREATININE 0.72   Estimated Creatinine Clearance: 58.3 ml/min (by C-G formula based on Cr of 0.72). No results found for this basename: VANCOTROUGH, Leodis Binet, VANCORANDOM, GENTTROUGH, GENTPEAK, GENTRANDOM, TOBRATROUGH, TOBRAPEAK, TOBRARND, AMIKACINPEAK, AMIKACINTROU, AMIKACIN,  in the last 72 hours   Microbiology: Recent Results (from the past 720 hour(s))  SURGICAL PCR SCREEN     Status: None   Collection Time    07/03/13  6:06 AM      Result Value Range Status   MRSA, PCR NEGATIVE  NEGATIVE Final   Staphylococcus aureus NEGATIVE  NEGATIVE Final   Comment:            The Xpert SA Assay (FDA     approved for NASAL specimens     in patients over 63 years of age),     is one component of     a comprehensive surveillance     program.  Test performance has     been validated by [employer] for patients greater     than or equal to 24 year old.     It is not intended     to diagnose infection nor to     guide or monitor treatment.  ANAEROBIC CULTURE     Status: None   Collection Time    07/03/13  2:50 PM      Result Value Range Status   Specimen Description ABSCESS   Final   Special  Requests LEFT NASOPHARYNAL MASS   Final   Gram Stain     Final   Value: MODERATE WBC PRESENT,BOTH PMN AND MONONUCLEAR     NO SQUAMOUS EPITHELIAL CELLS SEEN     MODERATE GRAM POSITIVE COCCI     IN PAIRS     Performed at [facility]   Culture     Final   Value: NO ANAEROBES ISOLATED     Performed at [facility]   Report Status 07/08/2013 FINAL   Final  CULTURE, ROUTINE-ABSCESS     Status: None   Collection Time    07/03/13  2:50 PM      Result Value Range Status   Specimen Description ABSCESS   Final   Special Requests LEFT NASOPHARYNAL MASS   Final   Gram Stain     Final   Value: ABUNDANT WBC PRESENT,BOTH PMN AND MONONUCLEAR     NO SQUAMOUS EPITHELIAL CELLS SEEN     MODERATE GRAM POSITIVE COCCI     IN PAIRS   Culture FEW SERRATIA MARCESCENS   Final   Report Status 07/06/2013 FINAL   Final   Organism ID, Bacteria SERRATIA MARCESCENS   Final  TISSUE  CULTURE     Status: None   Collection Time    07/03/13  3:47 PM      Result Value Range Status   Specimen Description TISSUE LEFT NASOPHARYNGEAL   Final   Special Requests NONE   Final   Gram Stain     Final   Value: NO WBC SEEN     NO SQUAMOUS EPITHELIAL CELLS SEEN     NO ORGANISMS SEEN     [facility]   Culture     Final   Value: NO GROWTH 3 DAYS     [facility]   Report Status 07/07/2013 FINAL   Final  AFB CULTURE WITH SMEAR     Status: None   Collection Time    07/03/13  3:47 PM      Result Value Range Status   Specimen Description TISSUE LEFT NASOPHARYNGEAL   Final   Special Requests NONE   Final   ACID FAST SMEAR     Final   Value: NO ACID FAST BACILLI SEEN     [facility]   Culture     Final   Value: CULTURE WILL BE EXAMINED FOR 6 WEEKS BEFORE ISSUING A FINAL REPORT     [facility]   Report Status PENDING   Incomplete  ANAEROBIC CULTURE     Status: None   Collection Time    07/03/13  3:47 PM      Result  Value Range Status   Specimen Description TISSUE LEFT NASOPHARYNGEAL   Final   Special Requests NONE   Final   Gram Stain     Final   Value: NO WBC SEEN     NO SQUAMOUS EPITHELIAL CELLS SEEN     NO ORGANISMS SEEN     [facility]   Culture     Final   Value: NO ANAEROBES ISOLATED     [facility]   Report Status 07/08/2013 FINAL   Final  FUNGUS CULTURE W SMEAR     Status: None   Collection Time    07/03/13  3:47 PM      Result Value Range Status   Specimen Description TISSUE LEFT NASOPHARYNGEAL   Final   Special Requests NONE   Final   Fungal Smear     Final   Value: NO YEAST OR FUNGAL ELEMENTS SEEN     [facility]   Culture     Final   Value: CULTURE IN PROGRESS FOR FOUR WEEKS     [facility]formed at [facility]   Report Status PENDING   Incomplete  URINE CULTURE     Status: None   Collection Time    07/04/13 12:27 AM      Result Value Range Status   Specimen Description URINE, CLEAN CATCH   Final   Special Requests unasyn Normal   Final   Culture  Setup Time     Final   Value: 07/04/2013 09:27     [facility]   Colony Count     Final   Value: NO GROWTH     [facility]   Culture     Final   Value: NO GROWTH     [facility] at [facility]   Report Status 07/05/2013 FINAL   Final     Assessment: 80 yo man who was  previously on cipro to cont cefepime for serratia osteomyelitis. S/p debridement of nasopharyngeal site last evening. He has had PICC line placed with plan for Abx for 6 weeks.  Last WBC 16.5 on 1/13.  AF.  Creat 0.72.  Creat cl ~ 58 ml/min.   Goal of Therapy:  Eradication of infection  Plan:  1. Increase Cefepime 2gm IV q24 hours to cefepime 2 gm IV q12h.  Herby Abraham, Pharm.D. [phone] 07/12/2013 12:01 PM

## 2013-07-12 NOTE — Progress Notes (Signed)
Physical Therapy Treatment Patient Details Name: Jason [name] MRN: [id number] DOB: 25-Mar-1930 Today's Date: 07/12/2013 Time: 4270-6237 PT Time Calculation (min): 26 min  PT Assessment / Plan / Recommendation  History of Present Illness 80 year old male with three weeks or more of left-sided headaches.  Early this week, tongue movement became abnormal and he went to the ER.  A head CT demonstrated fullness in the left nasopharynx which corresponded to an irregular fullness seen on endoscopy subsequently at the office.  He presents for biopsy.   PT Comments   Patient progressing well. Acute PT completed. Recommend daily walking with staff.   Follow Up Recommendations  Home health PT;Supervision/Assistance - 24 hour;Supervision for mobility/OOB     Does the patient have the potential to tolerate intense rehabilitation     Barriers to Discharge        Equipment Recommendations  Rolling walker with 5" wheels    Recommendations for Other Services    Frequency     Progress towards PT Goals Progress towards PT goals: Goals met/education completed, patient discharged from PT  Plan      Precautions / Restrictions     Pertinent Vitals/Pain no apparent distress    Mobility  Bed Mobility Supine to sit: Modified independent (Device/Increase time) Transfers Overall transfer level: Modified independent Ambulation/Gait Ambulation/Gait assistance: Modified independent (Device/Increase time) Ambulation Distance (Feet): 700 Feet Assistive device: Rolling walker (2 wheeled) Gait velocity interpretation: Below normal speed for age/gender General Gait Details: Patient wakling with increased steadiness, very eager to increase distance with ambulation    Exercises     PT Diagnosis:    PT Problem List:   PT Treatment Interventions:     PT Goals (current goals can now be found in the care plan section)    Visit Information  Last PT Received On: 07/12/13 Assistance Needed: +1 History  of Present Illness: 80 year old male with three weeks or more of left-sided headaches.  Early this week, tongue movement became abnormal and he went to the ER.  A head CT demonstrated fullness in the left nasopharynx which corresponded to an irregular fullness seen on endoscopy subsequently at the office.  He presents for biopsy.    Subjective Data      Cognition  Cognition Arousal/Alertness: Awake/alert Behavior During Therapy: WFL for tasks assessed/performed Overall Cognitive Status: Within Functional Limits for tasks assessed    Balance     End of Session PT - End of Session Equipment Utilized During Treatment: Gait belt Activity Tolerance: Patient tolerated treatment well Patient left: in chair;with call bell/phone within reach;with family/visitor present Nurse Communication: Mobility status   GP     Jacqualyn Posey 07/12/2013, 2:21 PM 07/12/2013 Jacqualyn Posey PTA [phone] pager [phone] office

## 2013-07-12 NOTE — Consult Note (Signed)
Reason for Consult:Consideration of placement of percutaneous endoscopic gastrostomy tube Referring Physician: Dr. Christia Reading  Jason [name] is an 80 y.o. male.  HPI: Patient is currently hospitalized with skull base osteomyelitis with inflammatory pseudotumor. Additionally, he has associated left hypoglossal and glossopharyngeal palsy resulting in significant dysphagia. Speech therapy has been working with him and he is cleared for dysphagia 1 diet. However, his oral intake is not adequate to maintain his nutrition. Dr. Jenne Pane asked [place] to consider placement of a PEG tube. The patient does note that he is having significant difficulty with swallowing.   Past Medical History  Diagnosis Date  . Arthritis   . Insomnia     takes Trazodone nightly  . PONV (postoperative nausea and vomiting)   . Hyperlipidemia     was on medication but has been off for a while  . Dizziness     was taking Meclizine but doesn't take now;found out that HR was 45  . Joint pain   . Joint swelling   . Itchy skin     scalp and uses a cream  . H/O hiatal hernia     takes Omeprazole daily  . Hemorrhoids   . Constipation   . History of colon polyps   . History of colitis     many yrs ago  . Urinary frequency   . History of kidney stones     passed on his own  . Enlarged prostate   . Anxiety     takes Diazepam daily prn  . Depression     takes Trazodone nightly  . Insomnia     takes Trazodone nightly    Past Surgical History  Procedure Laterality Date  . Right knee arthroscopy    . Hernia repair      double  . Hemorrhoidectomy with hemorrhoid banding    . Tonsillectomy    . Bilateral cataract surgery    . Circumcision    . Colonoscopy    . Esophagogastroduodenoscopy    . Ear cyst excision N/A 04/05/2013    Procedure: CYST REMOVAL;  Surgeon: Melvenia Beam, MD;  Location: [facility] OR;  Service: ENT;  Laterality: N/A;  . Septoplasty N/A 04/05/2013    Procedure: SEPTOPLASTY;  Surgeon: Melvenia Beam, MD;   Location: [facility] OR;  Service: ENT;  Laterality: N/A;  . Direct laryngoscopy N/A 06/26/2013    Procedure: DIRECT LARYNGOSCOPY;  Surgeon: Christia Reading, MD;  Location: [facility] OR;  Service: ENT;  Laterality: N/A;  . Sinus endo w/fusion N/A 07/03/2013    Procedure: ENDOSCOPIC SINUS SURGERY WITH FUSION NAVIGATION with By-Nasopharongeal Biopsy;  Surgeon: Christia Reading, MD;  Location: [facility] OR;  Service: ENT;  Laterality: N/A;    History reviewed. No pertinent family history.  Social History:  reports that he quit smoking about 20 years ago. He quit smokeless tobacco use about 6 months ago. His smokeless tobacco use included Chew. He reports that he does not drink alcohol or use illicit drugs.  Allergies:  Allergies  Allergen Reactions  . Bee Pollen Anaphylaxis  . Tetanus Toxoids Swelling    Tetanus Shot  . Hydrocodone Other (See Comments)    Confusion, "talking out of his head"    Medications:  Scheduled: . ceFEPime (MAXIPIME) IV  2 g Intravenous Q12H  . docusate sodium  100 mg Oral BID  . feeding supplement (ENSURE COMPLETE)  237 mL Oral BID BM  . feeding supplement (RESOURCE BREEZE)  1 Container Oral BID WC  . insulin aspart  0-9  Units Subcutaneous TID WC  . pantoprazole  40 mg Oral Daily  . predniSONE  30 mg Oral Q breakfast  . triamcinolone cream   Topical BID   WUJ:[id number], alum & mag hydroxide-simeth, bisacodyl, diazepam, morphine injection, oxyCODONE, promethazine, sodium chloride, traMADol, zolpidem  Results for orders placed during the hospital encounter of 07/01/13 (from the past 48 hour(s))  GLUCOSE, CAPILLARY     Status: Abnormal   Collection Time    07/10/13  4:33 PM      Result Value Range   Glucose-Capillary 127 (*) 70 - 99 mg/dL  GLUCOSE, CAPILLARY     Status: Abnormal   Collection Time    07/10/13 10:13 PM      Result Value Range   Glucose-Capillary 113 (*) 70 - 99 mg/dL  GLUCOSE, CAPILLARY     Status: Abnormal   Collection Time    07/11/13  7:41 AM      Result  Value Range   Glucose-Capillary 101 (*) 70 - 99 mg/dL  GLUCOSE, CAPILLARY     Status: Abnormal   Collection Time    07/11/13 11:48 AM      Result Value Range   Glucose-Capillary 123 (*) 70 - 99 mg/dL  GLUCOSE, CAPILLARY     Status: Abnormal   Collection Time    07/11/13  5:29 PM      Result Value Range   Glucose-Capillary 166 (*) 70 - 99 mg/dL  GLUCOSE, CAPILLARY     Status: Abnormal   Collection Time    07/11/13  8:32 PM      Result Value Range   Glucose-Capillary 142 (*) 70 - 99 mg/dL  GLUCOSE, CAPILLARY     Status: Abnormal   Collection Time    07/12/13 12:13 AM      Result Value Range   Glucose-Capillary 155 (*) 70 - 99 mg/dL  GLUCOSE, CAPILLARY     Status: Abnormal   Collection Time    07/12/13  7:30 AM      Result Value Range   Glucose-Capillary 119 (*) 70 - 99 mg/dL  GLUCOSE, CAPILLARY     Status: Abnormal   Collection Time    07/12/13 11:34 AM      Result Value Range   Glucose-Capillary 118 (*) 70 - 99 mg/dL    No results found.  Review of Systems  Constitutional: Negative.   HENT:       See history of present illness  Eyes: Negative.   Respiratory: Negative.   Cardiovascular: Negative.   Gastrointestinal: Negative.   Genitourinary: Negative.   Musculoskeletal: Negative.   Skin:       Chronic itchy skin  Neurological:       See history of present illness  Endo/Heme/Allergies: Negative.   Psychiatric/Behavioral:       Frustrated with current medical condition   Blood pressure 111/54, pulse 62, temperature 97.8 F (36.6 C), temperature source Oral, resp. rate 15, height 5\' 3"  (1.6 m), weight 145 lb 8 oz (65.998 kg), SpO2 99.00%. Physical Exam  Constitutional: He is oriented to person, place, and time. He appears well-developed. No distress.  HENT:  Oral mucous membranes moist  Neck: Neck supple. No tracheal deviation present.  Cardiovascular: Normal rate and normal heart sounds.   Respiratory: Effort normal and breath sounds normal. No stridor. No  respiratory distress. He has no wheezes.  GI: Soft. He exhibits no distension. There is no tenderness. There is no rebound and no guarding.  Bilateral inguinal hernia repair scars, no  upper abdominal scars  Neurological: He is alert and oriented to person, place, and time.  Skin: Skin is warm.    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 I have offered percutaneous endoscopic gastrostomy tube placement. I discussed his case in detail with my partner, Dr. Lindie SpruceWyatt, who is available on Monday. The patient wants to speak to his wife further and I will see him again on 1/18 to finalize the consent process and place preoperative orders. In the interim, my office will schedule for the procedure to be done Monday in endoscopy. I answered his questions and he expressed appreciation.  Kelven Flater E 07/12/2013, 12:59 PM

## 2013-07-12 NOTE — Progress Notes (Signed)
1 Day Post-Op  Subjective: Doing better today.  Pain improved, better energy.  Ate better breakfast.  Objective: Vital signs in last 24 hours: Temp:  [97.5 F (36.4 C)-97.8 F (36.6 C)] 97.8 F (36.6 C) (01/16 0543) Pulse Rate:  [62-65] 62 (01/16 0543) Resp:  [14-15] 15 (01/16 0543) BP: (110-114)/(54-64) 111/54 mmHg (01/16 0543) SpO2:  [97 %-99 %] 99 % (01/16 0543) Last BM Date: 07/09/13  Intake/Output from previous day: 01/15 0701 - 01/16 0700 In: 2078.2 [I.V.:1978.2; IV Piggyback:100] Out: 2050 [Urine:2050] Intake/Output this shift:    General appearance: alert, cooperative and no distress  Lab Results:   Recent Labs  07/09/13 1230  WBC 16.5*  HGB 11.6*  HCT 35.1*  PLT 286   BMET  Recent Labs  07/09/13 1230  NA 138  K 4.4  CL 101  CO2 28  GLUCOSE 131*  BUN 19  CREATININE 0.72  CALCIUM 8.3*   PT/INR No results found for this basename: LABPROT, INR,  in the last 72 hours ABG No results found for this basename: PHART, PCO2, PO2, HCO3,  in the last 72 hours  Studies/Results: No results found.  Anti-infectives: Anti-infectives   Start     Dose/Rate Route Frequency Ordered Stop   07/08/13 1230  ceFEPIme (MAXIPIME) 2 g in dextrose 5 % 50 mL IVPB     2 g 100 mL/hr over 30 Minutes Intravenous Every 24 hours 07/08/13 1220     07/05/13 1400  ciprofloxacin (CIPRO) IVPB 400 mg  Status:  Discontinued     400 mg 200 mL/hr over 60 Minutes Intravenous Every 12 hours 07/05/13 1257 07/08/13 1158   07/01/13 1800  Ampicillin-Sulbactam (UNASYN) 3 g in sodium chloride 0.9 % 100 mL IVPB     3 g 100 mL/hr over 60 Minutes Intravenous Every 6 hours 07/01/13 1731 07/02/13 1359      Assessment/Plan: Skull base osteomyelitis with inflammatory pseudotumor, left hypoglossal and glossopharyngeal palsy, dysphagia, dysarthria, headache, left serous otitis media s/p Procedure(s):  BEDSIDE  SINUS EXPLORATION  (Bilateral) Improved today after debridement of nasopharyngeal  surgical site last evening.  Continue cefepime.  Calorie intake still not sufficient.  Continue working with dietician and speech pathology.  I will go ahead and make arrangements for PEG placement Monday unless he shows good improvement in oral intake through the weekend.  Should be ready for discharge early to mid week next week, either to rehab or home with home health.  LOS: 11 days    Jason [name] 07/12/2013

## 2013-07-13 LAB — GLUCOSE, CAPILLARY
GLUCOSE-CAPILLARY: 97 mg/dL (ref 70–99)
GLUCOSE-CAPILLARY: 122 mg/dL — AB (ref 70–99)
Glucose-Capillary: 160 mg/dL — ABNORMAL HIGH (ref 70–99)
Glucose-Capillary: 121 mg/dL — ABNORMAL HIGH (ref 70–99)

## 2013-07-13 NOTE — Progress Notes (Signed)
2 Days Post-Op  Subjective: He is doing well and stable. He still cannot swallow. He is due to have a PEG on Monday.   Objective: Vital signs in last 24 hours: Temp:  [97.8 F (36.6 C)-98.4 F (36.9 C)] 97.9 F (36.6 C) (01/17 0525) Pulse Rate:  [62-79] 79 (01/17 0525) Resp:  [17-20] 18 (01/17 0525) BP: (109-121)/(56-68) 121/56 mmHg (01/17 0525) SpO2:  [99 %-100 %] 100 % (01/17 0525) Last BM Date: 07/12/13  Intake/Output from previous day: 01/16 0701 - 01/17 0700 In: 400 [I.V.:400] Out: 2125 [Urine:2125] Intake/Output this shift:    awake and laert and no distress. no swelling or erythema of face or eyes. l- clear. cv- regular. ext- no edema or pain  Lab Results:  No results found for this basename: WBC, HGB, HCT, PLT,  in the last 72 hours BMET No results found for this basename: NA, K, CL, CO2, GLUCOSE, BUN, CREATININE, CALCIUM,  in the last 72 hours PT/INR No results found for this basename: LABPROT, INR,  in the last 72 hours ABG No results found for this basename: PHART, PCO2, PO2, HCO3,  in the last 72 hours  Studies/Results: No results found.  Anti-infectives: Anti-infectives   Start     Dose/Rate Route Frequency Ordered Stop   07/12/13 2200  ceFEPIme (MAXIPIME) 2 g in dextrose 5 % 50 mL IVPB     2 g 100 mL/hr over 30 Minutes Intravenous Every 12 hours 07/12/13 1158     07/08/13 1230  ceFEPIme (MAXIPIME) 2 g in dextrose 5 % 50 mL IVPB  Status:  Discontinued     2 g 100 mL/hr over 30 Minutes Intravenous Every 24 hours 07/08/13 1220 07/12/13 1158   07/05/13 1400  ciprofloxacin (CIPRO) IVPB 400 mg  Status:  Discontinued     400 mg 200 mL/hr over 60 Minutes Intravenous Every 12 hours 07/05/13 1257 07/08/13 1158   07/01/13 1800  Ampicillin-Sulbactam (UNASYN) 3 g in sodium chloride 0.9 % 100 mL IVPB     3 g 100 mL/hr over 60 Minutes Intravenous Every 6 hours 07/01/13 1731 07/02/13 1359      Assessment/Plan: s/p Procedure(s):  BEDSIDE  SINUS EXPLORATION   (Bilateral) he is due for PEG tube on Monday. his pain is profoundly better.  still not able to swallow  LOS: 12 days    Suzanna ObeyBYERS, Pink Maye 07/13/2013

## 2013-07-14 LAB — GLUCOSE, CAPILLARY
GLUCOSE-CAPILLARY: 146 mg/dL — AB (ref 70–99)
GLUCOSE-CAPILLARY: 108 mg/dL — AB (ref 70–99)
Glucose-Capillary: 93 mg/dL (ref 70–99)
Glucose-Capillary: 155 mg/dL — ABNORMAL HIGH (ref 70–99)

## 2013-07-14 NOTE — Progress Notes (Signed)
Patient ID: Jason [name], male   DOB: 11/23/1929, 80 y.o.   MRN: [id number] 3 Days Post-Op  Subjective: Bad headache, ate fairly well yesterday breakfast and dinner, no lunch  Objective: Vital signs in last 24 hours: Temp:  [97.6 F (36.4 C)-97.7 F (36.5 C)] 97.7 F (36.5 C) (01/18 0534) Pulse Rate:  [63-81] 81 (01/18 0534) Resp:  [18] 18 (01/18 0534) BP: (114-125)/(48-61) 114/48 mmHg (01/18 0534) SpO2:  [95 %-100 %] 98 % (01/18 0534) Last BM Date: 07/13/13  Intake/Output from previous day: 01/17 0701 - 01/18 0700 In: 1689.2 [P.O.:360; I.V.:1229.2; IV Piggyback:100] Out: 1701 [Urine:1700; Stool:1] Intake/Output this shift:    General appearance: alert and cooperative Resp: clear to auscultation bilaterally GI: soft, NT  Lab Results: CBC  No results found for this basename: WBC, HGB, HCT, PLT,  in the last 72 hours BMET No results found for this basename: NA, K, CL, CO2, GLUCOSE, BUN, CREATININE, CALCIUM,  in the last 72 hours PT/INR No results found for this basename: LABPROT, INR,  in the last 72 hours ABG No results found for this basename: PHART, PCO2, PO2, HCO3,  in the last 72 hours  Studies/Results: No results found.  Anti-infectives: Anti-infectives   Start     Dose/Rate Route Frequency Ordered Stop   07/12/13 2200  ceFEPIme (MAXIPIME) 2 g in dextrose 5 % 50 mL IVPB     2 g 100 mL/hr over 30 Minutes Intravenous Every 12 hours 07/12/13 1158     07/08/13 1230  ceFEPIme (MAXIPIME) 2 g in dextrose 5 % 50 mL IVPB  Status:  Discontinued     2 g 100 mL/hr over 30 Minutes Intravenous Every 24 hours 07/08/13 1220 07/12/13 1158   07/05/13 1400  ciprofloxacin (CIPRO) IVPB 400 mg  Status:  Discontinued     400 mg 200 mL/hr over 60 Minutes Intravenous Every 12 hours 07/05/13 1257 07/08/13 1158   07/01/13 1800  Ampicillin-Sulbactam (UNASYN) 3 g in sodium chloride 0.9 % 100 mL IVPB     3 g 100 mL/hr over 60 Minutes Intravenous Every 6 hours 07/01/13 1731 07/02/13  1359      Assessment/Plan: Dysphagia due to sequelea of skull base osteomylitis - scheduled for PEG tomorrow in endo by Dr. Lindie SpruceWyatt. I had a long talk with him and his wife. He is still undecided whether to proceed. I will make him NPO and order consent should he choose to agree. We again discussed the procedure, risks, and benefits.   LOS: 13 days    Violeta GelinasBurke Jakorey Mcconathy, MD, MPH, FACS Pager: [phone]  07/14/2013

## 2013-07-14 NOTE — Progress Notes (Signed)
3 Days Post-Op  Subjective: He is having some pain in the left side of his head again. The pain is similar to what he had previously but it only shoots in very short intervals. He feels like his left ear has a stuffy sensation again. The drops had been stopped. He is taking fluids and solids better ever since he heard he may need a PEG tube.  Objective: Vital signs in last 24 hours: Temp:  [97.6 F (36.4 C)-97.7 F (36.5 C)] 97.7 F (36.5 C) (01/18 0534) Pulse Rate:  [63-81] 81 (01/18 0534) Resp:  [18] 18 (01/18 0534) BP: (114-125)/(48-61) 114/48 mmHg (01/18 0534) SpO2:  [95 %-100 %] 98 % (01/18 0534) Last BM Date: 07/13/13  Intake/Output from previous day: 01/17 0701 - 01/18 0700 In: 1689.2 [P.O.:360; I.V.:1229.2; IV Piggyback:100] Out: 1701 [Urine:1700; Stool:1] Intake/Output this shift:    He is awake and alert. Voice is normal. He has no epistaxis. There is no new findings on his head and neck exam. Heart is regular. Lungs are clear. No edema or pain in extremities.  Lab Results:  No results found for this basename: WBC, HGB, HCT, PLT,  in the last 72 hours BMET No results found for this basename: NA, K, CL, CO2, GLUCOSE, BUN, CREATININE, CALCIUM,  in the last 72 hours PT/INR No results found for this basename: LABPROT, INR,  in the last 72 hours ABG No results found for this basename: PHART, PCO2, PO2, HCO3,  in the last 72 hours  Studies/Results: No results found.  Anti-infectives: Anti-infectives   Start     Dose/Rate Route Frequency Ordered Stop   07/12/13 2200  ceFEPIme (MAXIPIME) 2 g in dextrose 5 % 50 mL IVPB     2 g 100 mL/hr over 30 Minutes Intravenous Every 12 hours 07/12/13 1158     07/08/13 1230  ceFEPIme (MAXIPIME) 2 g in dextrose 5 % 50 mL IVPB  Status:  Discontinued     2 g 100 mL/hr over 30 Minutes Intravenous Every 24 hours 07/08/13 1220 07/12/13 1158   07/05/13 1400  ciprofloxacin (CIPRO) IVPB 400 mg  Status:  Discontinued     400 mg 200 mL/hr  over 60 Minutes Intravenous Every 12 hours 07/05/13 1257 07/08/13 1158   07/01/13 1800  Ampicillin-Sulbactam (UNASYN) 3 g in sodium chloride 0.9 % 100 mL IVPB     3 g 100 mL/hr over 60 Minutes Intravenous Every 6 hours 07/01/13 1731 07/02/13 1359      Assessment/Plan: s/p Procedure(s):  BEDSIDE  SINUS EXPLORATION  (Bilateral) He is contemplating a PEG tube. The family is not sure they want to proceed as they feel like he is improving his by mouth intake. That will be their decision. His pain is concerning that it has returned but continued IV antibiotics is his treatment. I will start the Ciprodex drops back for his ear. Plans for discharge will be made with Dr. Jenne PaneBates tomorrow.  LOS: 13 days    Suzanna ObeyBYERS, Valen Gillison 07/14/2013

## 2013-07-15 ENCOUNTER — Encounter (HOSPITAL_COMMUNITY)
Admission: AD | Disposition: A | Discharge status: Home Health | Payer: Self-pay | Source: Ambulatory Visit | Attending: Otolaryngology

## 2013-07-15 ENCOUNTER — Encounter (HOSPITAL_COMMUNITY): Payer: Medicare Other | Admitting: Certified Registered"

## 2013-07-15 ENCOUNTER — Inpatient Hospital Stay (HOSPITAL_COMMUNITY): Payer: Medicare Other | Admitting: Certified Registered"

## 2013-07-15 ENCOUNTER — Encounter (HOSPITAL_COMMUNITY): Payer: Self-pay

## 2013-07-15 HISTORY — PX: PEG PLACEMENT: SHX5437

## 2013-07-15 HISTORY — PX: ESOPHAGOGASTRODUODENOSCOPY: SHX5428

## 2013-07-15 LAB — GLUCOSE, CAPILLARY
Glucose-Capillary: 101 mg/dL — ABNORMAL HIGH (ref 70–99)
Glucose-Capillary: 102 mg/dL — ABNORMAL HIGH (ref 70–99)
Glucose-Capillary: 117 mg/dL — ABNORMAL HIGH (ref 70–99)
Glucose-Capillary: 136 mg/dL — ABNORMAL HIGH (ref 70–99)

## 2013-07-15 SURGERY — EGD (ESOPHAGOGASTRODUODENOSCOPY)
Anesthesia: General

## 2013-07-15 MED ORDER — PROPOFOL 10 MG/ML IV BOLUS
INTRAVENOUS | Status: DC | PRN
  Administered 2013-07-15: 80 mg via INTRAVENOUS

## 2013-07-15 MED ORDER — ONDANSETRON HCL 4 MG/2ML IJ SOLN
INTRAMUSCULAR | Status: DC | PRN
Start: 2013-07-15 — End: 2013-07-15
  Administered 2013-07-15: 4 mg via INTRAVENOUS

## 2013-07-15 MED ORDER — LIDOCAINE HCL (CARDIAC) 20 MG/ML IV SOLN
INTRAVENOUS | Status: DC | PRN
  Administered 2013-07-15: 20 mg via INTRAVENOUS

## 2013-07-15 MED ORDER — SODIUM CHLORIDE 0.9 % IV SOLN
10.0000 mg | INTRAVENOUS | Status: DC | PRN
  Administered 2013-07-15: 10 ug/min via INTRAVENOUS

## 2013-07-15 MED ORDER — FENTANYL CITRATE 0.05 MG/ML IJ SOLN
INTRAMUSCULAR | Status: DC | PRN
  Administered 2013-07-15 (×2): 50 ug via INTRAVENOUS

## 2013-07-15 MED ORDER — CEFAZOLIN SODIUM-DEXTROSE 2-3 GM-% IV SOLR
2.0000 g | INTRAVENOUS | Status: AC
  Administered 2013-07-15: 2 g via INTRAVENOUS
  Filled 2013-07-15 (×2): qty 50

## 2013-07-15 MED ORDER — FENTANYL CITRATE 0.05 MG/ML IJ SOLN
INTRAMUSCULAR | Status: AC
  Administered 2013-07-15: 25 ug via INTRAVENOUS
  Filled 2013-07-15: qty 2

## 2013-07-15 MED ORDER — ONDANSETRON HCL 4 MG/2ML IJ SOLN: 4.0000 mg | Freq: Once | INTRAMUSCULAR | Status: AC | PRN

## 2013-07-15 MED ORDER — METHOCARBAMOL 100 MG/ML IJ SOLN
1000.0000 mg | Freq: Once | INTRAMUSCULAR | Status: AC
  Administered 2013-07-15: 1000 mg via INTRAVENOUS
  Filled 2013-07-15: qty 10

## 2013-07-15 MED ORDER — SUCCINYLCHOLINE CHLORIDE 20 MG/ML IJ SOLN
INTRAMUSCULAR | Status: DC | PRN
  Administered 2013-07-15: 100 mg via INTRAVENOUS

## 2013-07-15 MED ORDER — LACTATED RINGERS IV SOLN
INTRAVENOUS | Status: DC | PRN
  Administered 2013-07-15: 10:00:00 via INTRAVENOUS

## 2013-07-15 MED ORDER — FENTANYL CITRATE 0.05 MG/ML IJ SOLN
25.0000 ug | INTRAMUSCULAR | Status: DC | PRN
  Administered 2013-07-15: 25 ug via INTRAVENOUS
  Administered 2013-07-15: 50 ug via INTRAVENOUS
  Administered 2013-07-15: 25 ug via INTRAVENOUS
  Filled 2013-07-15: qty 2

## 2013-07-15 NOTE — Op Note (Signed)
OPERATIVE REPORT  DATE OF OPERATION: 07/01/2013 - 07/15/2013  PATIENT:  Jason [name]  80 y.o. male  PRE-OPERATIVE DIAGNOSIS:  dysphagia  POST-OPERATIVE DIAGNOSIS:  PEG placement  PROCEDURE:  Procedure(s): ESOPHAGOGASTRODUODENOSCOPY (EGD) PERCUTANEOUS ENDOSCOPIC GASTROSTOMY (PEG) PLACEMENT  SURGEON:  Surgeon(s): Cherylynn RidgesJames O Byrd Rushlow, MD  ASSISTANLeotis Shames: Jeffery, PA-C  ANESTHESIA:   general  EBL: <20 ml  BLOOD ADMINISTERED: none  DRAINS: Gastrostomy Tube   SPECIMEN:  No Specimen  COUNTS CORRECT:  YES  PROCEDURE DETAILS: The patient was taken to the endoscopy suite where an endotracheal anesthetic was administered. A time out was performed identifying the patient and the procedure to be performed.  He was prepped and draped in usual sterile manner exposing the left upper quadrant of the abdomen.  A Pentax endoscope was used to cannulate the oropharynx and subsequently the upper esophagus. Pictures of this patchy and film the esophagus were taken. We cannulated the stomach that went through the pylorus into the first and second portion of the duodenum which appeared to be normal.  We retracted the endoscope back into the midportion of the stomach we could see the impulse from the assistance of finger on the anterior abdominal wall. Was at bedside the stomach was percutaneously accessed using the angiocatheter. Once the inner needle was removed from the angiocatheter, the looped the blue wire was passed through the angiocatheter which was subsequently snared and brought out through the patient's mouth. We then looped the blue wire around the pull-through gastrostomy tube and pulled it back through the patient's oropharynx into the stomach with a bolster ended at the anterior abdominal wall. We subsequently passed endoscope into the esophagus and the stomach identifying the bolster of the G-tube at the anterior abdominal wall.  At that site it was secured in place with the external bolster. All  counts were correct. He was subsequently taken to the postanesthesia recovery room in stable condition.  PATIENT DISPOSITION:  PACU - hemodynamically stable.   Prakash Kimberling O 1/19/201510:52 AM

## 2013-07-15 NOTE — Anesthesia Preprocedure Evaluation (Signed)
Anesthesia Evaluation  Patient identified by MRN, date of birth, ID band Patient awake    Reviewed: Allergy & Precautions, H&P , NPO status , Patient's Chart, lab work & pertinent test results  Airway Mallampati: II TM Distance: >3 FB Neck ROM: Full    Dental  (+) Teeth Intact and Dental Advisory Given   Pulmonary former smoker,  breath sounds clear to auscultation        Cardiovascular Rhythm:Regular Rate:Normal     Neuro/Psych    GI/Hepatic   Endo/Other    Renal/GU      Musculoskeletal   Abdominal   Peds  Hematology   Anesthesia Other Findings   Reproductive/Obstetrics                           Anesthesia Physical Anesthesia Plan  ASA: III  Anesthesia Plan: General   Post-op Pain Management:    Induction: Intravenous  Airway Management Planned: Oral ETT  Additional Equipment:   Intra-op Plan:   Post-operative Plan: Extubation in OR  Informed Consent: I have reviewed the patients History and Physical, chart, labs and discussed the procedure including the risks, benefits and alternatives for the proposed anesthesia with the patient or authorized representative who has indicated his/her understanding and acceptance.   Dental advisory given  Plan Discussed with: CRNA and Anesthesiologist  Anesthesia Plan Comments:         Anesthesia Quick Evaluation

## 2013-07-15 NOTE — Progress Notes (Signed)
SLP Cancellation Note  Patient Details Name: Jason [name] MRN: [id number] DOB: 1930-01-23   Cancelled treatment:       Reason Eval/Treat Not Completed: Patient at procedure or test/unavailable (Receiving PEG tube today. Will f/u 1/20. )  Ferdinand LangoLeah Zabdiel Dripps MA, CCC-SLP [phone]  Jason [name] 07/15/2013, 8:49 AM

## 2013-07-15 NOTE — Anesthesia Postprocedure Evaluation (Signed)
  Anesthesia Post-op Note  Patient: Jason [name]  Procedure(s) Performed: Procedure(s): ESOPHAGOGASTRODUODENOSCOPY (EGD) (N/A) PERCUTANEOUS ENDOSCOPIC GASTROSTOMY (PEG) PLACEMENT (N/A)  Patient Location: PACU  Anesthesia Type:General  Level of Consciousness: awake, alert  and oriented  Airway and Oxygen Therapy: Patient Spontanous Breathing and Patient connected to nasal cannula oxygen  Post-op Pain: mild  Post-op Assessment: Post-op Vital signs reviewed, Patient's Cardiovascular Status Stable, Respiratory Function Stable, Patent Airway, No signs of Nausea or vomiting and Pain level controlled  Post-op Vital Signs: stable  Complications: No apparent anesthesia complications

## 2013-07-15 NOTE — Preoperative (Signed)
Beta Blockers   Reason not to administer Beta Blockers:Not Applicable 

## 2013-07-15 NOTE — Progress Notes (Signed)
After careful consideration, the patient has decided to have the PEG placed.  I confirmed that this will allow for improved nutrition.  It is temporary and can be removed after 6 weeks without further surgery..  The wife was present and answered her questions.  We will proceed with the procedure in endoscopy.  The patient will have general anesthesia.  Marta LamasJames O. Gae BonWyatt, III, MD, FACS [phone]--pager [phone]--office [facility]

## 2013-07-15 NOTE — Progress Notes (Signed)
ANTIBIOTIC CONSULT NOTE - follow up  Pharmacy Consult for cefepime Indication: skull osteomyelitis, cultures positive for serratia  Allergies  Allergen Reactions  . Bee Pollen Anaphylaxis  . Tetanus Toxoids Swelling    Tetanus Shot  . Hydrocodone Other (See Comments)    Confusion, "talking out of his head"    Patient Measurements: Height: 5\' 3"  (160 cm) Weight: 145 lb 8 oz (65.998 kg) IBW/kg (Calculated) : 56.9   Vital Signs: Temp: 98.1 F (36.7 C) (01/19 1217) Temp src: Oral (01/19 1217) BP: 99/56 mmHg (01/19 1217) Pulse Rate: 66 (01/19 1217) Intake/Output from previous day: 01/18 0701 - 01/19 0700 In: 1540.8 [P.O.:120; I.V.:1320.8; IV Piggyback:100] Out: 2725 [Urine:2725] Intake/Output from this shift: Total I/O In: 500 [I.V.:500] Out: -   Labs: No results found for this basename: WBC, HGB, PLT, LABCREA, CREATININE,  in the last 72 hours Estimated Creatinine Clearance: 58.3 ml/min (by C-G formula based on Cr of 0.72). No results found for this basename: VANCOTROUGH, Leodis Binet, VANCORANDOM, GENTTROUGH, GENTPEAK, GENTRANDOM, TOBRATROUGH, TOBRAPEAK, TOBRARND, AMIKACINPEAK, AMIKACINTROU, AMIKACIN,  in the last 72 hours   Microbiology: Recent Results (from the past 720 hour(s))  SURGICAL PCR SCREEN     Status: None   Collection Time    07/03/13  6:06 AM      Result Value Range Status   MRSA, PCR NEGATIVE  NEGATIVE Final   Staphylococcus aureus NEGATIVE  NEGATIVE Final   Comment:            The Xpert SA Assay (FDA     approved for NASAL specimens     in patients over 88 years of age),     is one component of     a comprehensive surveillance     program.  Test performance has     been validated by [facility] [employer] for patients greater     than or equal to 36 year old.     It is not intended     to diagnose infection nor to     guide or monitor treatment.  ANAEROBIC CULTURE     Status: None   Collection Time    07/03/13  2:50 PM      Result Value Range Status    Specimen Description ABSCESS   Final   Special Requests LEFT NASOPHARYNAL MASS   Final   Gram Stain     Final   Value: MODERATE WBC PRESENT,BOTH PMN AND MONONUCLEAR     NO SQUAMOUS EPITHELIAL CELLS SEEN     MODERATE GRAM POSITIVE COCCI     IN PAIRS     Performed at [facility]   Culture     Final   Value: NO ANAEROBES ISOLATED     Performed at [facility]   Report Status 07/08/2013 FINAL   Final  CULTURE, ROUTINE-ABSCESS     Status: None   Collection Time    07/03/13  2:50 PM      Result Value Range Status   Specimen Description ABSCESS   Final   Special Requests LEFT NASOPHARYNAL MASS   Final   Gram Stain     Final   Value: ABUNDANT WBC PRESENT,BOTH PMN AND MONONUCLEAR     NO SQUAMOUS EPITHELIAL CELLS SEEN     MODERATE GRAM POSITIVE COCCI     IN PAIRS   Culture FEW SERRATIA MARCESCENS   Final   Report Status 07/06/2013 FINAL   Final   Organism ID, Bacteria SERRATIA MARCESCENS  Final  TISSUE CULTURE     Status: None   Collection Time    07/03/13  3:47 PM      Result Value Range Status   Specimen Description TISSUE LEFT NASOPHARYNGEAL   Final   Special Requests NONE   Final   Gram Stain     Final   Value: NO WBC SEEN     NO SQUAMOUS EPITHELIAL CELLS SEEN     NO ORGANISMS SEEN     [facility]   Culture     Final   Value: NO GROWTH 3 DAYS     [facility]formed at [facility]   Report Status 07/07/2013 FINAL   Final  AFB CULTURE WITH SMEAR     Status: None   Collection Time    07/03/13  3:47 PM      Result Value Range Status   Specimen Description TISSUE LEFT NASOPHARYNGEAL   Final   Special Requests NONE   Final   ACID FAST SMEAR     Final   Value: NO ACID FAST BACILLI SEEN     [facility]formed at [facility]   Culture     Final   Value: CULTURE WILL BE EXAMINED FOR 6 WEEKS BEFORE ISSUING A FINAL REPORT     [facility]formed at [facility]   Report Status PENDING   Incomplete  ANAEROBIC CULTURE     Status: None    Collection Time    07/03/13  3:47 PM      Result Value Range Status   Specimen Description TISSUE LEFT NASOPHARYNGEAL   Final   Special Requests NONE   Final   Gram Stain     Final   Value: NO WBC SEEN     NO SQUAMOUS EPITHELIAL CELLS SEEN     NO ORGANISMS SEEN     [facility]   Culture     Final   Value: NO ANAEROBES ISOLATED     [facility]formed at [facility]   Report Status 07/08/2013 FINAL   Final  FUNGUS CULTURE W SMEAR     Status: None   Collection Time    07/03/13  3:47 PM      Result Value Range Status   Specimen Description TISSUE LEFT NASOPHARYNGEAL   Final   Special Requests NONE   Final   Fungal Smear     Final   Value: NO YEAST OR FUNGAL ELEMENTS SEEN     [facility]formed [facility]   Culture     Final   Value: CULTURE IN PROGRESS FOR FOUR WEEKS     [facility]formed at [facility]   Report Status PENDING   Incomplete  URINE CULTURE     Status: None   Collection Time    07/04/13 12:27 AM      Result Value Range Status   Specimen Description URINE, CLEAN CATCH   Final   Special Requests unasyn Normal   Final   Culture  Setup Time     Final   Value: 07/04/2013 09:27     [facility]   Colony Count     Final   Value: NO GROWTH     [facility]formed [facility]   Culture     Final   Value: NO GROWTH     [facility]formed at [facility]   Report Status 07/05/2013 FINAL   Final    Medical History: Past Medical  History  Diagnosis Date  . Arthritis   . Insomnia     takes Trazodone nightly  . PONV (postoperative nausea and vomiting)   . Hyperlipidemia     was on medication but has been off for a while  . Dizziness     was taking Meclizine but doesn't take now;found out that HR was 45  . Joint pain   . Joint swelling   . Itchy skin     scalp and uses a cream  . H/O hiatal hernia     takes Omeprazole daily  . Hemorrhoids   . Constipation   . History of colon polyps   . History of colitis      many yrs ago  . Urinary frequency   . History of kidney stones     passed on his own  . Enlarged prostate   . Anxiety     takes Diazepam daily prn  . Depression     takes Trazodone nightly  . Insomnia     takes Trazodone nightly    Medications:  Prescriptions prior to admission  Medication Sig Dispense Refill  . betamethasone dipropionate (DIPROLENE) 0.05 % cream Apply 1 application topically daily as needed (itchy scalp).      . diazepam (VALIUM) 5 MG tablet Take 2.5 mg by mouth daily as needed for anxiety.       Marland Kitchen HYDROcodone-acetaminophen (NORCO/VICODIN) 5-325 MG per tablet Take 1 tablet by mouth every 6 (six) hours as needed for moderate pain.      . meloxicam (MOBIC) 15 MG tablet Take 15 mg by mouth daily.      . predniSONE (DELTASONE) 20 MG tablet Take 2 tablets (40 mg total) by mouth daily.  12 tablet  0  . traMADol (ULTRAM) 50 MG tablet Take 50 mg by mouth every 6 (six) hours as needed.       Assessment: 80 yo man who was previously on cipro to cont cefepime for serratia osteomyelitis. He has had PICC line placed with plan for Abx for 6 weeks.   Goal of Therapy:  Eradication of infection  Plan:  Cefepime 2gm IV q12 hours. F/u clinical course and renal function  Joeseph Verville Poteet 07/15/2013,1:22 PM

## 2013-07-15 NOTE — Progress Notes (Signed)
NUTRITION FOLLOW UP  Intervention:   Continuous TF recommendations: Initiate Jevity 1.2 @ 20 ml/hr via PEG and increase by 10 ml every 4 hours to goal rate of 60 ml/hr. At goal rate, tube feeding regimen will provide 1728 kcal, 80 grams of protein, and 1166 ml of H2O.  If/when continuous infusions are discontinued pt will need additional water flushes; 130 ml flush of H2O every 6 hours. After pt is tolerating continuous TF, recommend transitioning pt to bolus tube feeds.  Bolus TF recommendations: Provide 2 cans of Jevity 1.2 via PEG TID to provide 1710 kcal, 79 grams of protein, and 1146 ml of H2O.  RD to continue to monitor   Nutrition Dx:   Inadequate oral intake related to dysphagia, lethargy as evidenced by wife's report; ongoing  Goal:   Pt to meet >/= 90% of their estimated nutrition needs; not met  Monitor:   Weights; up 18 lbs since admission Labs; blood glucose ranging 93 to 160 mg/dL PO intake; 5-35% of meals I/O's: -1.2 L since admission  Assessment:   Pt underwent endoscopic left nasopharynx biopsy with fusion image guidance 1/7. Pt received TF via NGT for a few days. TF's were discontinued, diet advanced to Dysphagia 1 diet. PO intake remained poor due to dysphagia. Pt had PEG placed today.    Height: Ht Readings from Last 1 Encounters:  07/02/13 _0  (1.6 m)    Weight Status:   Wt Readings from Last 1 Encounters:  07/09/13 145 lb 8 oz (65.998 kg)  admission weight 127 lbs  Re-estimated needs:  Kcal: 1550-1770 Protein: 80-92 grams  Fluid: 1.7 L/day  Skin: incision on nose  Diet Order: NPO   Intake/Output Summary (Last 24 hours) at 07/15/13 1255 Last data filed at 07/15/13 1051  Gross per 24 hour  Intake 1920.83 ml  Output   2725 ml  Net -804.17 ml    Last BM: 1/17  Labs:   Recent Labs Lab 07/08/13 2145 07/09/13 1230  NA 139 138  K 4.4 4.4  CL 102 101  CO2 27 28  BUN 19 19  CREATININE 0.77 0.72  CALCIUM 8.3* 8.3*  GLUCOSE 128* 131*     CBG (last 3)   Recent Labs  07/14/13 2122 07/15/13 0808 07/15/13 1221  GLUCAP 108* 101* 102*    Scheduled Meds: . ceFEPime (MAXIPIME) IV  2 g Intravenous Q12H  . docusate sodium  100 mg Oral BID  . feeding supplement (ENSURE COMPLETE)  237 mL Oral BID BM  . feeding supplement (RESOURCE BREEZE)  1 Container Oral BID WC  . insulin aspart  0-9 Units Subcutaneous TID WC  . pantoprazole  40 mg Oral Daily  . predniSONE  30 mg Oral Q breakfast  . triamcinolone cream   Topical BID    Continuous Infusions: . dextrose 5 % and 0.45 % NaCl with KCl 20 mEq/L 50 mL/hr at 07/15/13 [place], LDN Inpatient Clinical Dietitian Pager: [phone] After Hours Pager: [phone]

## 2013-07-15 NOTE — Transfer of Care (Signed)
Immediate Anesthesia Transfer of Care Note  Patient: Jason [name]  Procedure(s) Performed: Procedure(s): ESOPHAGOGASTRODUODENOSCOPY (EGD) (N/A) PERCUTANEOUS ENDOSCOPIC GASTROSTOMY (PEG) PLACEMENT (N/A)  Patient Location: PACU  Anesthesia Type:General  Level of Consciousness: awake, alert  and oriented  Airway & Oxygen Therapy: Patient Spontanous Breathing and Patient connected to nasal cannula oxygen  Post-op Assessment: Report given to PACU RN and Post -op Vital signs reviewed and stable  Post vital signs: Reviewed and stable  Complications: No apparent anesthesia complications

## 2013-07-15 NOTE — Anesthesia Procedure Notes (Signed)
Procedure Name: Intubation Date/Time: 07/15/2013 10:13 AM Performed by: Armandina GemmaMIRARCHI, [name] Pre-anesthesia Checklist: Patient identified, Patient being monitored, Timeout performed, Emergency Drugs available and Suction available Patient Re-evaluated:Patient Re-evaluated prior to inductionOxygen Delivery Method: Circle system utilized Preoxygenation: Pre-oxygenation with 100% oxygen Intubation Type: IV induction Ventilation: Mask ventilation without difficulty Grade View: Grade I Tube type: Oral Tube size: 7.5 mm Number of attempts: 1 Airway Equipment and Method: Stylet and Video-laryngoscopy Placement Confirmation: ETT inserted through vocal cords under direct vision,  breath sounds checked- equal and bilateral and positive ETCO2 Secured at: 22 cm Tube secured with: Tape Dental Injury: Teeth and Oropharynx as per pre-operative assessment  Comments: IV induction Jason [name] - intubation AM CRNA atraumatic teeth and mouth as preop

## 2013-07-15 NOTE — Progress Notes (Signed)
Day of Surgery  Subjective: I am seeing him after PEG placement.  He is having a fair amount of abdominal pain, General Surgery is aware and has assessed him.  Left head pain yesterday noted.  Drops were restarted but there is no ear drainage.  He is not complaining about the head pain currently, perhaps due to abdominal pain.  Objective: Vital signs in last 24 hours: Temp:  [97.7 F (36.5 C)-98.4 F (36.9 C)] 97.7 F (36.5 C) (01/19 1737) Pulse Rate:  [63-84] 84 (01/19 1737) Resp:  [14-22] 22 (01/19 1737) BP: (99-131)/(56-78) 105/59 mmHg (01/19 1737) SpO2:  [96 %-100 %] 100 % (01/19 1737) Last BM Date: 07/13/13  Intake/Output from previous day: 01/18 0701 - 01/19 0700 In: 1540.8 [P.O.:120; I.V.:1320.8; IV Piggyback:100] Out: 2725 [Urine:2725] Intake/Output this shift: Total I/O In: 500 [I.V.:500] Out: 700 [Urine:500; Drains:200]  General appearance: alert, cooperative and in pain. Ears: Left EAC normal.  TM with patent tympanostomy tube in place.  No otorrhea, middle ear aerated.  Lab Results:  No results found for this basename: WBC, HGB, HCT, PLT,  in the last 72 hours BMET No results found for this basename: NA, K, CL, CO2, GLUCOSE, BUN, CREATININE, CALCIUM,  in the last 72 hours PT/INR No results found for this basename: LABPROT, INR,  in the last 72 hours ABG No results found for this basename: PHART, PCO2, PO2, HCO3,  in the last 72 hours  Studies/Results: No results found.  Anti-infectives: Anti-infectives   Start     Dose/Rate Route Frequency Ordered Stop   07/15/13 1000  ceFAZolin (ANCEF) IVPB 2 g/50 mL premix     2 g 100 mL/hr over 30 Minutes Intravenous On call 07/15/13 0958 07/15/13 1015   07/12/13 2200  ceFEPIme (MAXIPIME) 2 g in dextrose 5 % 50 mL IVPB     2 g 100 mL/hr over 30 Minutes Intravenous Every 12 hours 07/12/13 1158     07/08/13 1230  ceFEPIme (MAXIPIME) 2 g in dextrose 5 % 50 mL IVPB  Status:  Discontinued     2 g 100 mL/hr over 30 Minutes  Intravenous Every 24 hours 07/08/13 1220 07/12/13 1158   07/05/13 1400  ciprofloxacin (CIPRO) IVPB 400 mg  Status:  Discontinued     400 mg 200 mL/hr over 60 Minutes Intravenous Every 12 hours 07/05/13 1257 07/08/13 1158   07/01/13 1800  Ampicillin-Sulbactam (UNASYN) 3 g in sodium chloride 0.9 % 100 mL IVPB     3 g 100 mL/hr over 60 Minutes Intravenous Every 6 hours 07/01/13 1731 07/02/13 1359      Assessment/Plan: Skull base osteomyelitis with inflammatory pseudotumor, left hypoglossal and glossopharyngeal palsy, dysphagia, dysarthria, left serous otitis media, headache, malnutrition s/p Procedure(s): ESOPHAGOGASTRODUODENOSCOPY (EGD) (N/A) PERCUTANEOUS ENDOSCOPIC GASTROSTOMY (PEG) PLACEMENT (N/A) PEG placed.  General Surgery aware of pain.  Continue IV cefipime.  I do not see a need for otic drops.  Assuming abdominal pain improves or is managed, discharge planning can begin.  It seems he will be a good candidate for discharge to home with home health assistance for IV cefipime dosing and tube feedings assistance.  His wife asked about a hospital bed at home since he cannot sleep lying down due to difficulty managing secretions.  I would like to see him on bolus feeds at discharge.  LOS: 14 days    Lima Chillemi 07/15/2013

## 2013-07-16 ENCOUNTER — Encounter (HOSPITAL_COMMUNITY): Payer: Self-pay | Admitting: General Surgery

## 2013-07-16 LAB — GLUCOSE, CAPILLARY
GLUCOSE-CAPILLARY: 142 mg/dL — AB (ref 70–99)
GLUCOSE-CAPILLARY: 133 mg/dL — AB (ref 70–99)
Glucose-Capillary: 127 mg/dL — ABNORMAL HIGH (ref 70–99)
Glucose-Capillary: 151 mg/dL — ABNORMAL HIGH (ref 70–99)

## 2013-07-16 MED ORDER — JEVITY 1.2 CAL PO LIQD
1000.0000 mL | ORAL | Status: DC
  Administered 2013-07-16: 1000 mL
  Administered 2013-07-17: 17:00:00
  Filled 2013-07-16 (×4): qty 1000

## 2013-07-16 NOTE — Progress Notes (Signed)
Occupational Therapy Treatment Patient Details Name: Jason [name] MRN: [id number] DOB: 08/11/1929 Today's Date: 07/16/2013 Time: 4098-1191 OT Time Calculation (min): 12 min  OT Assessment / Plan / Recommendation  History of present illness 80 year old male with three weeks or more of left-sided headaches.  Early this week, tongue movement became abnormal and he went to the ER.  A head CT demonstrated fullness in the left nasopharynx which corresponded to an irregular fullness seen on endoscopy subsequently at the office.  He presents for biopsy.   OT comments  Pt. Amb. In hall with wife upon arrival.  Doing great.  S/min guard a for all aspects of self care.  Greatest concern at this time is wife reports pt. Does not tolerate supine secondary to c/o not being able to swallow.  Insists on hob being elevated to sitting position with additional pillows behind his back.  Concerns are for pressure to buttocks and also not having that set up at home.  Encouraged pt. To lay head back but he remained very stiff and visibly anxious.    Follow Up Recommendations  Home health OT;Supervision/Assistance - 24 hour           Equipment Recommendations  None recommended by OT        Frequency Min 2X/week   Progress towards OT Goals Progress towards OT goals: Progressing toward goals  Plan Discharge plan remains appropriate    Precautions / Restrictions Precautions Precautions: Fall Restrictions Weight Bearing Restrictions: No   Pertinent Vitals/Pain C/o of abdomen pain, repositioned    ADL  Toilet Transfer: Performed;Min guard Toilet Transfer Method: Sit to Barista: Comfort height toilet;Grab bars Toileting - Clothing Manipulation and Hygiene: Performed;Supervision/safety Where Assessed - Toileting Clothing Manipulation and Hygiene: Standing Transfers/Ambulation Related to ADLs: pt. up amb. in hall with his wife upon arrival, int. cues for walker safety mostly due to  iv pole management ADL Comments: all aspects of toileting min guard/s. cues to complete transfer/pivot prior to sitting down     OT Goals(current goals can now be found in the care plan section)    Visit Information  Last OT Received On: 07/16/13 History of Present Illness: 80 year old male with three weeks or more of left-sided headaches.  Early this week, tongue movement became abnormal and he went to the ER.  A head CT demonstrated fullness in the left nasopharynx which corresponded to an irregular fullness seen on endoscopy subsequently at the office.  He presents for biopsy.                 Cognition  Cognition Arousal/Alertness: Awake/alert Behavior During Therapy: WFL for tasks assessed/performed Overall Cognitive Status: Within Functional Limits for tasks assessed    Mobility  Bed Mobility General bed mobility comments: back to bed with hob flat and no rails, s.  pt. not comfortable in supine and requests hob to be elevated with the addition of pillows also.  encouraged hob to be layed back some to aide with pressure relief pt. visibly not happy with this concept Transfers Overall transfer level: Modified independent Equipment used: Rolling walker (2 wheeled) Transfers: Sit to/from Stand Sit to Stand: Min guard;Supervision             End of Session OT - End of Session Equipment Utilized During Treatment: Rolling walker Activity Tolerance: Patient tolerated treatment well Patient left: in bed;with call bell/phone within reach;with family/visitor present       Robet Leu, COTA/L  07/16/2013, 12:12 PM

## 2013-07-16 NOTE — Progress Notes (Signed)
Speech Language Pathology Treatment: Dysphagia  Patient Details Name: Jason [name] MRN: [id number] DOB: 01/04/1930 Today's Date: 07/16/2013 Time: 4098-11911330-1425 SLP Time Calculation (min): 55 min  Assessment / Plan / Recommendation Clinical Impression  Pt seated in chair eating lunch.  PEG placement yesterday, and pt reporting abdominal pain. RN informed.  Pt noted to adhere to safe swallow precautions with minimal cues needed.  TF have been initiated, so po intake has decreased.  PO intake is very slow and laborious for pt at this time. Pt was encouraged to continue po intake as able, but that TF would provide all nutrition and hydration needs.  Exercises were provided and reviewed with pt and wife to minimize disuse atrophy and maintain muscular integrity for safe swallow.  Pt continued to express discomfort, so SLP asked RN if pt could have an ice pack to place on PEG site to ease discomfort.  RN ok'd, and SLP provided ice pack to pt/wife with towel if cold was too significant.  Wife reported plan for DC in the next few days, based on pain level.  Recommending continued ST intervention via home health at DC to continue dysphagia therapy.     HPI HPI: 80 year old male with three weeks or more of left-sided headaches.  Early this week, tongue movement became abnormal and he went to the ER.  A head CT demonstrated fullness in the left nasopharynx which corresponded to an irregular fullness seen on endoscopy subsequently at the office.  He presents for biopsy.  Found to have Skull base osteomyelitis with inflammatory pseudotumor   Pertinent Vitals VSS  SLP Plan  Continue with current plan of care    Recommendations Diet recommendations: Dysphagia 1 (puree);Thin liquid Liquids provided via: Cup;No straw Medication Administration: Whole meds with puree Supervision: Staff to assist with self feeding;Full supervision/cueing for compensatory strategies Compensations: Slow rate;Small sips/bites;Multiple  dry swallows after each bite/sip Postural Changes and/or Swallow Maneuvers: Out of bed for meals;Seated upright 90 degrees;Head turn left during swallow Home Health at DC              Oral Care Recommendations: Oral care BID;Staff/trained caregiver to provide oral care Follow up Recommendations: Home health SLP Plan: Continue with current plan of care    GO    Celia B. Murvin NatalBueche, [facility] CeMSP, CCC-SLP [phone]  Leigh AuroraBueche, Celia Brown 07/16/2013, 2:31 PM

## 2013-07-16 NOTE — Progress Notes (Signed)
Patient to d/c home with IV Cefipime given q12hr.  Currently is on a dose schedule of 10am/10pm.  In order to [facility] the [facility], his dose tonight (07/16/2013) needs to be at 2100pm.  His am dose on 07/17/2013 needs to then be at 0800.  [facility] to be at the patient's home to administer the 07/17/2013 pm dose at 1830pm.   I spoke with Dr. Christia Readingwight Bates at 1355pm today and he gave permission to adjust the patient's IV Cefipime dose this way to get [facility].  Assigned RN is aware of this change.   [facility] need [facility] order for the home IV Cefipime written, indicating the dose, the frequency and the length of time patient is to receive it.  It doesn't have to be a hand-written prescription as long as the agency rep can [facility]    [facility] will need an order for the tube feeding indicating the type, the amount patient is to get, the frequency he is to get it, and the length of time over which he will receive this ( greater or less than 90 days).  The patient and wife have decided to try different methods at home as far as making patient comfortable enough to sleep without the hospital bed at first.  They understand that they can contact the MD office to initiate the process of getting a hospital bed if their other methods do not work.   Carlyle LipaMichelle Cadan Maggart, RN BSN MHA CCM  Case Manager, Trauma Service/Unit 58M [phone]

## 2013-07-16 NOTE — Progress Notes (Signed)
Patient ID: Jason SchultzeBrice A [name], male   DOB: [birth year], 80 y.o.   MRN: [id number]   LOS: 15 days   Subjective: Pt feels good today.  He states that his HAs are improving and the pain in his abdomen is also improving. His ability to swallow has also improved and he has been able to eat almost all his breakfast this morning. No new c/o today.  Normal BM. Urinating well.  Objective: Vital signs in last 24 hours: Temp:  [97.7 F (36.5 C)-98.4 F (36.9 C)] 98.3 F (36.8 C) (01/20 0621) Pulse Rate:  [61-84] 63 (01/20 0621) Resp:  [14-22] 18 (01/20 0621) BP: (91-131)/(48-78) 102/51 mmHg (01/20 0621) SpO2:  [98 %-100 %] 100 % (01/20 0203) Last BM Date: 07/13/13  Lab Results:  CBC No results found for this basename: WBC, HGB, HCT, PLT,  in the last 72 hours BMET No results found for this basename: NA, K, CL, CO2, GLUCOSE, BUN, CREATININE, CALCIUM,  in the last 72 hours  Imaging: No results found.   PE: Physical Exam:  General: pleasant, WD/WN white male who is sitting in chair in NAD HEENT: head is normocephalic, atraumatic.  Sclera are noninjected. Ears and nose without any masses or lesions.  Mouth is pink and moist Heart: regular, rate, and rhythm.  Normal s1,s2. No obvious murmurs, gallops, or rubs noted.  Palpable radial and pedal pulses bilaterally Lungs: CTAB, no wheezes, rhonchi, or rales noted.  Respiratory effort nonlabored Abd: soft, NT/ND, +BS, PEG tube placement LUQ, no masses, hernias, or organomegaly MS: all 4 extremities are symmetrical with no cyanosis or edema. Skin: warm and dry with no masses, lesions, or rashes Psych: A&Ox3 with an appropriate affect.   Assessment/Plan:  Skull Base Osteomyelitis  6 week course of ABX Dysphagia:  PEG tube placed in endo yesterday FEN: start tube feeds today Dispo: Discharge home with home health assistance of IV cefipime dosing and tube feedings assistance.  Trauma will sign off. Please call with questions/concerns.   Pryor OchoaWaqas  Judeth Cornfieldariq PA-S [employer]  General Trauma PA Pager: [phone]   07/16/2013

## 2013-07-16 NOTE — Progress Notes (Signed)
1 Day Post-Op  Subjective: I saw the patient this morning.  Abdominal pain was improving.  He was able to eat some breakfast.  Headache was not bothering him.  Objective: Vital signs in last 24 hours: Temp:  [97.7 F (36.5 C)-98.4 F (36.9 C)] 98.3 F (36.8 C) (01/20 0621) Pulse Rate:  [61-84] 63 (01/20 0621) Resp:  [18-22] 18 (01/20 0621) BP: (91-107)/(48-78) 102/51 mmHg (01/20 0621) SpO2:  [98 %-100 %] 100 % (01/20 0203) Last BM Date: 07/13/13  Intake/Output from previous day: 01/19 0701 - 01/20 0700 In: 1770.8 [I.V.:1710.8; IV Piggyback:60] Out: 1725 [Urine:1250; Drains:475] Intake/Output this shift:    General appearance: alert, cooperative and no distress  Lab Results:  No results found for this basename: WBC, HGB, HCT, PLT,  in the last 72 hours BMET No results found for this basename: NA, K, CL, CO2, GLUCOSE, BUN, CREATININE, CALCIUM,  in the last 72 hours PT/INR No results found for this basename: LABPROT, INR,  in the last 72 hours ABG No results found for this basename: PHART, PCO2, PO2, HCO3,  in the last 72 hours  Studies/Results: No results found.  Anti-infectives: Anti-infectives   Start     Dose/Rate Route Frequency Ordered Stop   07/15/13 1000  ceFAZolin (ANCEF) IVPB 2 g/50 mL premix     2 g 100 mL/hr over 30 Minutes Intravenous On call 07/15/13 0958 07/15/13 1015   07/12/13 2200  ceFEPIme (MAXIPIME) 2 g in dextrose 5 % 50 mL IVPB     2 g 100 mL/hr over 30 Minutes Intravenous Every 12 hours 07/12/13 1158     07/08/13 1230  ceFEPIme (MAXIPIME) 2 g in dextrose 5 % 50 mL IVPB  Status:  Discontinued     2 g 100 mL/hr over 30 Minutes Intravenous Every 24 hours 07/08/13 1220 07/12/13 1158   07/05/13 1400  ciprofloxacin (CIPRO) IVPB 400 mg  Status:  Discontinued     400 mg 200 mL/hr over 60 Minutes Intravenous Every 12 hours 07/05/13 1257 07/08/13 1158   07/01/13 1800  Ampicillin-Sulbactam (UNASYN) 3 g in sodium chloride 0.9 % 100 mL IVPB     3 g 100  mL/hr over 60 Minutes Intravenous Every 6 hours 07/01/13 1731 07/02/13 1359      Assessment/Plan: Skull base osteomyelitis with inflammatory pseudotumor, left hypoglossal and glossopharyngeal palsy, dypshagia, dysarthria, malnutrition, left serous otitis media, headache s/p Procedure(s): ESOPHAGOGASTRODUODENOSCOPY (EGD) (N/A) PERCUTANEOUS ENDOSCOPIC GASTROSTOMY (PEG) PLACEMENT (N/A) To start tube feeds via G-tube today and convert to bolus feeds tomorrow.  Once there, he can be discharged home with home health to assist with cefipime dosing and tube feedings.  LOS: 15 days    Jason [name] 07/16/2013

## 2013-07-16 NOTE — Progress Notes (Signed)
Up in chair out in hall. Abdomen soft with mild tenderness at PEG site, no generalized tenderness. HH RN for IV ABX and TF are being arranged. Patient examined and I agree with the assessment and plan  Violeta GelinasBurke Harvin Konicek, MD, MPH, FACS Pager: [phone]  07/16/2013 10:58 AM

## 2013-07-17 LAB — COMPREHENSIVE METABOLIC PANEL
ALT: 25 U/L (ref 0–53)
AST: 14 U/L (ref 0–37)
Albumin: 2.4 g/dL — ABNORMAL LOW (ref 3.5–5.2)
Alkaline Phosphatase: 87 U/L (ref 39–117)
BUN: 20 mg/dL (ref 6–23)
CHLORIDE: 99 meq/L (ref 96–112)
CO2: 25 mEq/L (ref 19–32)
CREATININE: 0.73 mg/dL (ref 0.50–1.35)
Calcium: 8.6 mg/dL (ref 8.4–10.5)
GFR calc Af Amer: >90 mL/min (ref >90)
GFR calc non Af Amer: 85 mL/min — ABNORMAL LOW (ref >90)
GLUCOSE: 157 mg/dL — AB (ref 70–99)
Potassium: 4.6 mEq/L (ref 3.7–5.3)
Sodium: 136 mEq/L — ABNORMAL LOW (ref 137–147)
Total Bilirubin: 0.6 mg/dL (ref 0.3–1.2)
Total Protein: 6.1 g/dL (ref 6.0–8.3)

## 2013-07-17 LAB — CBC WITH DIFFERENTIAL/PLATELET
Basophils Absolute: 0 10*3/uL (ref 0.0–0.1)
Basophils Relative: 0 % (ref 0–1)
Eosinophils Absolute: 0.1 10*3/uL (ref 0.0–0.7)
Eosinophils Relative: 1 % (ref 0–5)
HCT: 34.1 % — ABNORMAL LOW (ref 39.0–52.0)
HEMOGLOBIN: 11.1 g/dL — AB (ref 13.0–17.0)
Lymphocytes Relative: 4 % — ABNORMAL LOW (ref 12–46)
Lymphs Abs: 0.5 10*3/uL — ABNORMAL LOW (ref 0.7–4.0)
MCH: 28.6 pg (ref 26.0–34.0)
MCHC: 32.6 g/dL (ref 30.0–36.0)
MCV: 87.9 fL (ref 78.0–100.0)
Monocytes Absolute: 0.3 10*3/uL (ref 0.1–1.0)
Monocytes Relative: 2 % — ABNORMAL LOW (ref 3–12)
NEUTROS ABS: 11 10*3/uL — AB (ref 1.7–7.7)
Neutrophils Relative %: 92 % — ABNORMAL HIGH (ref 43–77)
Platelets: 184 10*3/uL (ref 150–400)
RBC: 3.88 MIL/uL — ABNORMAL LOW (ref 4.22–5.81)
RDW: 15.6 % — ABNORMAL HIGH (ref 11.5–15.5)
WBC: 12 10*3/uL — ABNORMAL HIGH (ref 4.0–10.5)

## 2013-07-17 LAB — GLUCOSE, CAPILLARY
GLUCOSE-CAPILLARY: 89 mg/dL (ref 70–99)
GLUCOSE-CAPILLARY: 149 mg/dL — AB (ref 70–99)
Glucose-Capillary: 153 mg/dL — ABNORMAL HIGH (ref 70–99)
Glucose-Capillary: 136 mg/dL — ABNORMAL HIGH (ref 70–99)

## 2013-07-17 LAB — SEDIMENTATION RATE: Sed Rate: 37 mm/hr — ABNORMAL HIGH (ref 0–16)

## 2013-07-17 MED ORDER — JEVITY 1.2 CAL PO LIQD
474.0000 mL | Freq: Three times a day (TID) | ORAL | Status: DC
  Administered 2013-07-18 (×2): 474 mL
  Filled 2013-07-17 (×3): qty 474

## 2013-07-17 MED ORDER — JEVITY 1.2 CAL PO LIQD
237.0000 mL | Freq: Once | ORAL | Status: AC
  Administered 2013-07-17: 237 mL

## 2013-07-17 NOTE — Progress Notes (Signed)
2 Days Post-Op  Subjective: He reports waking with worse abdominal pain this morning, feels he got tangled up in the night.  Head/neck pain not too bad today.  Objective: Vital signs in last 24 hours: Temp:  [97.7 F (36.5 C)-98.3 F (36.8 C)] 98.3 F (36.8 C) (01/21 0458) Pulse Rate:  [64-73] 73 (01/21 0458) Resp:  [17-20] 17 (01/21 0458) BP: (104-109)/(50-53) 104/51 mmHg (01/21 0458) SpO2:  [96 %-100 %] 96 % (01/21 0458) Weight:  [65.998 kg (145 lb 8 oz)] 65.998 kg (145 lb 8 oz) (01/21 0700) Last BM Date: 07/13/13  Intake/Output from previous day: 01/20 0701 - 01/21 0700 In: 2149.2 [P.O.:420; I.V.:1089.2; IV Piggyback:200] Out: 2000 [Urine:2000] Intake/Output this shift:    General appearance: alert, cooperative and no distress  Lab Results:  No results found for this basename: WBC, HGB, HCT, PLT,  in the last 72 hours BMET No results found for this basename: NA, K, CL, CO2, GLUCOSE, BUN, CREATININE, CALCIUM,  in the last 72 hours PT/INR No results found for this basename: LABPROT, INR,  in the last 72 hours ABG No results found for this basename: PHART, PCO2, PO2, HCO3,  in the last 72 hours  Studies/Results: No results found.  Anti-infectives: Anti-infectives   Start     Dose/Rate Route Frequency Ordered Stop   07/15/13 1000  ceFAZolin (ANCEF) IVPB 2 g/50 mL premix     2 g 100 mL/hr over 30 Minutes Intravenous On call 07/15/13 0958 07/15/13 1015   07/12/13 2200  ceFEPIme (MAXIPIME) 2 g in dextrose 5 % 50 mL IVPB     2 g 100 mL/hr over 30 Minutes Intravenous Every 12 hours 07/12/13 1158     07/08/13 1230  ceFEPIme (MAXIPIME) 2 g in dextrose 5 % 50 mL IVPB  Status:  Discontinued     2 g 100 mL/hr over 30 Minutes Intravenous Every 24 hours 07/08/13 1220 07/12/13 1158   07/05/13 1400  ciprofloxacin (CIPRO) IVPB 400 mg  Status:  Discontinued     400 mg 200 mL/hr over 60 Minutes Intravenous Every 12 hours 07/05/13 1257 07/08/13 1158   07/01/13 1800   Ampicillin-Sulbactam (UNASYN) 3 g in sodium chloride 0.9 % 100 mL IVPB     3 g 100 mL/hr over 60 Minutes Intravenous Every 6 hours 07/01/13 1731 07/02/13 1359      Assessment/Plan: Skull base osteomyelitis with inflammatory pseudotumor, left hypoglossal and glossopharyngeal palsy, dysphagia, dysarthria, headache, left serous otitis media, malnutrition s/p Procedure(s): ESOPHAGOGASTRODUODENOSCOPY (EGD) (N/A) PERCUTANEOUS ENDOSCOPIC GASTROSTOMY (PEG) PLACEMENT (N/A) Moving toward discharge.  He seems to be in too much pain this morning to discharge home today.  Will change to bolus feeds through his G-tube and continue current care otherwise.  Hopefully, he will feel better tomorrow and can be discharged home with home health assistance.  LOS: 16 days    Jason [name] 07/17/2013

## 2013-07-18 ENCOUNTER — Encounter (HOSPITAL_COMMUNITY): Payer: Self-pay

## 2013-07-18 LAB — GLUCOSE, CAPILLARY
Glucose-Capillary: 94 mg/dL (ref 70–99)
Glucose-Capillary: 132 mg/dL — ABNORMAL HIGH (ref 70–99)
Glucose-Capillary: 134 mg/dL — ABNORMAL HIGH (ref 70–99)

## 2013-07-18 MED ORDER — DEXTROSE 5 % IV SOLN: 2.0000 g | Freq: Two times a day (BID) | INTRAVENOUS | Status: DC

## 2013-07-18 MED ORDER — HEPARIN SOD (PORK) LOCK FLUSH 100 UNIT/ML IV SOLN
250.0000 [IU] | INTRAVENOUS | Status: AC | PRN
  Administered 2013-07-18: 250 [IU]

## 2013-07-18 MED ORDER — PREDNISONE 20 MG PO TABS: 20.0000 mg | ORAL_TABLET | Freq: Every day | ORAL | Status: DC

## 2013-07-18 MED ORDER — OXYCODONE HCL 5 MG PO TABS: 5.0000 mg | ORAL_TABLET | ORAL | Status: DC | PRN

## 2013-07-18 NOTE — Discharge Summary (Signed)
Physician Discharge Summary  Patient ID: Jason [name] MRN: [id number] DOB/AGE: [birth year] 80 y.o.  Admit date: 07/01/2013 Discharge date: 07/18/2013  Admission Diagnoses: Nasopharynx mass, left hypoglossal and glossopharyngeal palsy, dysphagia, dysathria, headache, left serous otitis media, malnutrition  Discharge Diagnoses: same as above, skull base osteomyelitis with inflammatory pseudotumor   Discharged Condition: stable  Hospital Course: 80 year old male presented to the ER around Christmas with dysarthria and dysphagia after a few weeks of left-sided headache.  Head CT was negative except for fullness of the nasopharynx.  ESR was elevated.  He was referred to my office for tongue swelling and was found to have paralysis of the left side of the tongue and soft palate.  Fiberoptic exam of the nasopharynx revealed fullness and inflammatory versus neoplastic changes.  He was taken for biopsy which revealed only inflammatory tissue.  Pain worsened dramatically after biopsy and he was admitted to the hospital on 07/01/13 due to intractable pain, malnutrition, and his other symptoms.  MR imaging was performed indicating an erosive mass of the left nasopharynx/skull base eroding the clivus and petrous apex.  He was taken back to the OR for image guided biopsy and the nasopharynx was debrided of white gooey but fibrous tissue surrounded by inflammatory changes.  Cultures and histology were taken.  He was treated with Unasyn upon admission but Cipro was then added.  Culture results ultimately grew Serratia but gram stain showed GPCs.  ID was consulted and he was changed to cefipime.  At this point, he was being treated for what appeared to represent skull base osteomyelitis with inflammatory pseudotumor.  A minimum of six weeks of IV cefipime was planned so a PICC line was placed.  Headache pain improved quite well after nasopharyngeal debridement but worsened at one point.  Nasal endoscopy with removal of  crusting and drainage from the nasopharynx was performed at the bedside and pain subsequently improved.  Early in his hospital stay, a Panda tube was placed through the nose to assist in providing nutrition.  Speech pathology worked with him and found he was safe to take a dysphagia 1 diet with thin liquids.  He tried to maintain calorie intake without the Panda but was unable so an elective PEG was placed by general surgery.  He resumed tube feeds via PEG tube.  He has been working with PT and OT and is felt stable for discharge home with home health assistance.  At the time of discharge, his headache is much improved and abdominal pain from the PEG tube has improved.  He complains primarily of neck pain which is due to pre-existing cervical spine problems that were being considered for surgery prior to his current illness.  He is being discharged with home health assistance for nursing, PT, and IV cefipime and tube feeding assistance.  Consults: PT, OT, Speech, ID, Gen Surg  Significant Diagnostic Studies: MRI, pathology, culture  Treatments: surgery: Nasal endoscopy with biopsy, PEG tube placement  Discharge Exam: Blood pressure 107/57, pulse 63, temperature 97.3 F (36.3 C), temperature source Oral, resp. rate 16, height 5' 1"  (1.549 m), weight 57.017 kg (125 lb 11.2 oz), SpO2 98.00%. General appearance: alert, cooperative, no distress and uncomfortable. Head: Normocephalic, without obvious abnormality, left tongue and soft palate weakness  Disposition: 01-Home or Self Care  Discharge Orders   Future Appointments Provider Department Dept Phone   07/25/2013 3:15 PM Michel Bickers, MD [facility] [phone]   Future Orders Complete By Expires  Diet - low sodium heart healthy  As directed    Discharge instructions  As directed    Comments:     Home health to assist with twice daily cefipime dosing via PICC line.  Take two cans of tube feeding through the  PEG tube three times per day but continue to eat and drink according to the speech pathologist's recommendations.  Home nursing and home PT to assist.  A hospital bed is to be arranged.  Continue physical activity.   Increase activity slowly  As directed        Medication List    STOP taking these medications       HYDROcodone-acetaminophen 5-325 MG per tablet  Commonly known as:  NORCO/VICODIN      TAKE these medications       betamethasone dipropionate 0.05 % cream  Commonly known as:  DIPROLENE  Apply 1 application topically daily as needed (itchy scalp).     dextrose 5 % SOLN 50 mL with ceFEPIme 2 G SOLR 2 g  Inject 2 g into the vein every 12 (twelve) hours.     diazepam 5 MG tablet  Commonly known as:  VALIUM  Take 2.5 mg by mouth daily as needed for anxiety.     meloxicam 15 MG tablet  Commonly known as:  MOBIC  Take 15 mg by mouth daily.     oxyCODONE 5 MG immediate release tablet  Commonly known as:  Oxy IR/ROXICODONE  Take 1 tablet (5 mg total) by mouth every 4 (four) hours as needed for moderate pain.     predniSONE 20 MG tablet  Commonly known as:  DELTASONE  Take 1 tablet (20 mg total) by mouth daily.     traMADol 50 MG tablet  Commonly known as:  ULTRAM  Take 50 mg by mouth every 6 (six) hours as needed.           Follow-up Information   Follow up with Zafirah Vanzee, MD. Schedule an appointment as soon as possible for a visit in 1 week.   Specialty:  Otolaryngology   Contact information:   [address] [place] [address] [phone]       Signed: Melida Quitter 07/18/2013, 12:02 PM

## 2013-07-18 NOTE — Progress Notes (Signed)
Pt will need hospital bed for discharge.  Wife will be in the pt room until the delivery person calls her and then she will go to the house to meet them.  The cell numbers to call for delivery notification are: [phone] or [phone].

## 2013-07-18 NOTE — Progress Notes (Signed)
Patient discharged to home with instructions given to wife

## 2013-07-18 NOTE — Progress Notes (Signed)
PICC line capped off, flushed with 10cc NS, GBR, followed by Heparin 250 units. Shandelle Borrelli M   

## 2013-07-25 ENCOUNTER — Ambulatory Visit (INDEPENDENT_AMBULATORY_CARE_PROVIDER_SITE_OTHER): Payer: Medicare Other | Admitting: Internal Medicine

## 2013-07-25 ENCOUNTER — Telehealth: Payer: Self-pay | Admitting: *Deleted

## 2013-07-25 VITALS — Wt 125.0 lb

## 2013-07-25 DIAGNOSIS — R739 Hyperglycemia, unspecified: Secondary | ICD-10-CM

## 2013-07-25 DIAGNOSIS — J019 Acute sinusitis, unspecified: Secondary | ICD-10-CM

## 2013-07-25 DIAGNOSIS — M869 Osteomyelitis, unspecified: Secondary | ICD-10-CM

## 2013-07-25 DIAGNOSIS — D649 Anemia, unspecified: Secondary | ICD-10-CM

## 2013-07-25 DIAGNOSIS — R7309 Other abnormal glucose: Secondary | ICD-10-CM

## 2013-07-25 DIAGNOSIS — R1310 Dysphagia, unspecified: Secondary | ICD-10-CM

## 2013-07-25 NOTE — Telephone Encounter (Signed)
[facility] Dr Orvan Falconerampbell to add Sed Rate and CRP to patient next lab draw and to also make sure the patient has enough IV antibiotic until his next visit on 08/20/13 with Dr Drue SecondSnider.   [facility] [phone]

## 2013-07-25 NOTE — Progress Notes (Signed)
Patient ID: Jason [name], male   DOB: 29-Dec-1929, 80 y.o.   MRN: [id number]         [facility]  Patient Active Problem List   Diagnosis Date Noted  . Acute sinusitis 07/25/2013    Priority: High  . Osteomyelitis of skull 07/25/2013    Priority: High  . Hyperglycemia 07/25/2013    Priority: Medium  . Normocytic anemia 07/25/2013    Priority: Medium  . Dysphagia 07/25/2013    Priority: Medium  . Protein-calorie malnutrition, severe 07/02/2013    Priority: Medium  . Loss of weight 03/24/2013    Priority: Medium  . GERD (gastroesophageal reflux disease) 03/24/2013    Priority: Medium    Patient's Medications  New Prescriptions   No medications on file  Previous Medications   BETAMETHASONE DIPROPIONATE (DIPROLENE) 0.05 % CREAM    Apply 1 application topically daily as needed (itchy scalp).   DEXTROSE 5 % SOLN 50 ML WITH CEFEPIME 2 G SOLR 2 G    Inject 2 g into the vein every 12 (twelve) hours.   DIAZEPAM (VALIUM) 5 MG TABLET    Take 2.5 mg by mouth daily as needed for anxiety.    MELOXICAM (MOBIC) 15 MG TABLET    Take 15 mg by mouth daily.   OXYCODONE (OXY IR/ROXICODONE) 5 MG IMMEDIATE RELEASE TABLET    Take 1 tablet (5 mg total) by mouth every 4 (four) hours as needed for moderate pain.   PREDNISONE (DELTASONE) 20 MG TABLET    Take 1 tablet (20 mg total) by mouth daily.   TRAMADOL (ULTRAM) 50 MG TABLET    Take 50 mg by mouth every 6 (six) hours as needed.  Modified Medications   No medications on file  Discontinued Medications   No medications on file    Subjective: Jason [name] is in with his wife for his hospital followup visit. He underwent left nasal polypectomy in October of last year. Shortly thereafter he began to develop some severe left-sided headache leading to her admission to the hospital earlier this month. CT scan showed some erosion of the left clivus and features of apex. Endoscopy revealed diffuse soft tissue inflammation and a  biopsy showed nonspecific acute sinusitis. Gram stain of tissue showed gram-positive cocci in pairs and the culture grew Serratia. He had been on antibiotics for several days before cultures were obtained. He was seen by my partner, Dr. Drue Second, who recommended IV cefepime. He is now completed 21 days of antibiotic therapy. He has not had any problems tolerating his PICC or cefepime. He had lost a great deal of weight but seems to have gained one or 2 pounds since discharge. He has been getting supplemental PEG tube feedings. He has also been on prednisone.  He states that he is feeling worse but his wife is quite vocal in reminding him that he is much better. His severe left-sided headache has resolved but he has some persistent dull, generalized headache. He has only needed occasional Tylenol recently. He still feels extremely weak and his wife feels like he is depressed. He saw Dr. Jenne Pane, his ENT physician, earlier this morning and his wife reports that he performed some sinus irrigation and debridement. She thinks her repeat CT scan has been ordered.  Review of Systems: Pertinent items are noted in HPI.  Past Medical History  Diagnosis Date  . Arthritis   . Insomnia     takes Trazodone nightly  . PONV (postoperative nausea and  vomiting)   . Hyperlipidemia     was on medication but has been off for a while  . Dizziness     was taking Meclizine but doesn't take now;found out that HR was 45  . Joint pain   . Joint swelling   . Itchy skin     scalp and uses a cream  . H/O hiatal hernia     takes Omeprazole daily  . Hemorrhoids   . Constipation   . History of colon polyps   . History of colitis     many yrs ago  . Urinary frequency   . History of kidney stones     passed on his own  . Enlarged prostate   . Anxiety     takes Diazepam daily prn  . Depression     takes Trazodone nightly  . Insomnia     takes Trazodone nightly    History  Substance Use Topics  . Smoking status:  Former Smoker    Quit date: 07/01/1993  . Smokeless tobacco: Former NeurosurgeonUser    Types: Chew    Quit date: 12/29/2012     Comment: quit chewing tobacco several months ago and stopped smoking cigars yrs ago  . Alcohol Use: No    No family history on file.  Allergies  Allergen Reactions  . Bee Pollen Anaphylaxis  . Tetanus Toxoids Swelling    Tetanus Shot  . Hydrocodone Other (See Comments)    Confusion, "talking out of his head"    Objective:    General: He is very weak. He is using a walker. Skin: Some dry red patches over her scalp. Right arm PICC site appears normal Lungs: Clear Cor: Regular S1 and S2 with no murmurs Abdomen: Soft and nontender. PEG site appears normal Mood and affect: Poor eye contact. He asked numerous questions about whether it is worth continuing treatment  Lab Results  Component Value Date   CRP 0.6* 07/09/2013   Lab Results  Component Value Date   ESRSEDRATE 37* 07/17/2013   POCTSEDRATE 5 01/31/2013     Assessment: Although he is not convinced it does sound like he is making some slow improvement on therapy for polymicrobial sinusitis and early osteomyelitis at the base of the skull.  Plan: 1. Continue cefepime at least 3 more weeks 2. Repeat sedimentation rate and C-reactive protein 3. Follow up results of repeat CT scan 4. Followup within 3 weeks   Cliffton AstersJohn Mylasia Vorhees, MD [name] [phone] pager   [phone] cell 07/25/2013, 5:22 PM

## 2013-07-28 DIAGNOSIS — M869 Osteomyelitis, unspecified: Secondary | ICD-10-CM

## 2013-07-28 HISTORY — DX: Osteomyelitis, unspecified: M86.9

## 2013-07-29 ENCOUNTER — Other Ambulatory Visit: Payer: Self-pay | Admitting: Otolaryngology

## 2013-07-29 DIAGNOSIS — G523 Disorders of hypoglossal nerve: Secondary | ICD-10-CM

## 2013-07-29 DIAGNOSIS — J392 Other diseases of pharynx: Secondary | ICD-10-CM

## 2013-08-02 ENCOUNTER — Ambulatory Visit
Admission: RE | Admit: 2013-08-02 | Discharge: 2013-08-02 | Disposition: A | Discharge status: Home / Self Care | Payer: Medicare Other | Source: Ambulatory Visit | Attending: Otolaryngology | Admitting: Otolaryngology

## 2013-08-02 DIAGNOSIS — J392 Other diseases of pharynx: Secondary | ICD-10-CM

## 2013-08-02 DIAGNOSIS — G523 Disorders of hypoglossal nerve: Secondary | ICD-10-CM

## 2013-08-02 LAB — FUNGUS CULTURE W SMEAR: FUNGAL SMEAR: NONE SEEN

## 2013-08-02 MED ORDER — IOHEXOL 300 MG/ML  SOLN
75.0000 mL | Freq: Once | INTRAMUSCULAR | Status: AC | PRN
  Administered 2013-08-02: 75 mL via INTRAVENOUS

## 2013-08-07 ENCOUNTER — Inpatient Hospital Stay (HOSPITAL_COMMUNITY)
Admission: AD | Admit: 2013-08-07 | Discharge: 2013-08-10 | DRG: 539 | Disposition: A | Discharge status: Home / Self Care | Payer: Medicare Other | Source: Ambulatory Visit | Attending: Internal Medicine | Admitting: Internal Medicine

## 2013-08-07 ENCOUNTER — Encounter: Payer: Self-pay | Admitting: Infectious Diseases

## 2013-08-07 ENCOUNTER — Encounter (HOSPITAL_COMMUNITY): Payer: Self-pay | Admitting: General Practice

## 2013-08-07 ENCOUNTER — Ambulatory Visit (INDEPENDENT_AMBULATORY_CARE_PROVIDER_SITE_OTHER): Payer: Medicare Other | Admitting: Infectious Diseases

## 2013-08-07 VITALS — BP 116/68 | HR 85 | Temp 98.7°F | Ht 62.5 in | Wt 133.0 lb

## 2013-08-07 DIAGNOSIS — K219 Gastro-esophageal reflux disease without esophagitis: Secondary | ICD-10-CM | POA: Diagnosis present

## 2013-08-07 DIAGNOSIS — R739 Hyperglycemia, unspecified: Secondary | ICD-10-CM

## 2013-08-07 DIAGNOSIS — F411 Generalized anxiety disorder: Secondary | ICD-10-CM | POA: Diagnosis present

## 2013-08-07 DIAGNOSIS — Z79899 Other long term (current) drug therapy: Secondary | ICD-10-CM

## 2013-08-07 DIAGNOSIS — M869 Osteomyelitis, unspecified: Secondary | ICD-10-CM

## 2013-08-07 DIAGNOSIS — R634 Abnormal weight loss: Secondary | ICD-10-CM

## 2013-08-07 DIAGNOSIS — E785 Hyperlipidemia, unspecified: Secondary | ICD-10-CM | POA: Diagnosis present

## 2013-08-07 DIAGNOSIS — R1310 Dysphagia, unspecified: Secondary | ICD-10-CM

## 2013-08-07 DIAGNOSIS — F329 Major depressive disorder, single episode, unspecified: Secondary | ICD-10-CM | POA: Diagnosis present

## 2013-08-07 DIAGNOSIS — J019 Acute sinusitis, unspecified: Secondary | ICD-10-CM | POA: Diagnosis present

## 2013-08-07 DIAGNOSIS — IMO0002 Reserved for concepts with insufficient information to code with codable children: Secondary | ICD-10-CM

## 2013-08-07 DIAGNOSIS — D649 Anemia, unspecified: Secondary | ICD-10-CM | POA: Diagnosis present

## 2013-08-07 DIAGNOSIS — F3289 Other specified depressive episodes: Secondary | ICD-10-CM | POA: Diagnosis present

## 2013-08-07 DIAGNOSIS — G521 Disorders of glossopharyngeal nerve: Secondary | ICD-10-CM | POA: Diagnosis present

## 2013-08-07 DIAGNOSIS — Z66 Do not resuscitate: Secondary | ICD-10-CM | POA: Diagnosis present

## 2013-08-07 DIAGNOSIS — R471 Dysarthria and anarthria: Secondary | ICD-10-CM | POA: Diagnosis present

## 2013-08-07 DIAGNOSIS — E43 Unspecified severe protein-calorie malnutrition: Secondary | ICD-10-CM | POA: Diagnosis present

## 2013-08-07 DIAGNOSIS — G4700 Insomnia, unspecified: Secondary | ICD-10-CM | POA: Diagnosis present

## 2013-08-07 DIAGNOSIS — Z87891 Personal history of nicotine dependence: Secondary | ICD-10-CM

## 2013-08-07 HISTORY — DX: Osteomyelitis, unspecified: M86.9

## 2013-08-07 HISTORY — DX: Gastro-esophageal reflux disease without esophagitis: K21.9

## 2013-08-07 LAB — CBC
HEMATOCRIT: 31.4 % — AB (ref 39.0–52.0)
HEMOGLOBIN: 10.6 g/dL — AB (ref 13.0–17.0)
MCH: 29.2 pg (ref 26.0–34.0)
MCHC: 33.8 g/dL (ref 30.0–36.0)
MCV: 86.5 fL (ref 78.0–100.0)
Platelets: 161 10*3/uL (ref 150–400)
RBC: 3.63 MIL/uL — ABNORMAL LOW (ref 4.22–5.81)
RDW: 16.6 % — ABNORMAL HIGH (ref 11.5–15.5)
WBC: 11.1 10*3/uL — ABNORMAL HIGH (ref 4.0–10.5)

## 2013-08-07 LAB — BASIC METABOLIC PANEL
BUN: 28 mg/dL — ABNORMAL HIGH (ref 6–23)
CALCIUM: 8.6 mg/dL (ref 8.4–10.5)
CO2: 25 meq/L (ref 19–32)
Chloride: 101 mEq/L (ref 96–112)
Creatinine, Ser: 0.78 mg/dL (ref 0.50–1.35)
GFR calc Af Amer: >90 mL/min (ref >90)
GFR calc non Af Amer: 82 mL/min — ABNORMAL LOW (ref >90)
GLUCOSE: 106 mg/dL — AB (ref 70–99)
Potassium: 4.5 mEq/L (ref 3.7–5.3)
Sodium: 139 mEq/L (ref 137–147)

## 2013-08-07 MED ORDER — VANCOMYCIN HCL 500 MG IV SOLR
500.0000 mg | Freq: Two times a day (BID) | INTRAVENOUS | Status: DC
  Administered 2013-08-07 – 2013-08-09 (×5): 500 mg via INTRAVENOUS
  Filled 2013-08-07 (×6): qty 500

## 2013-08-07 MED ORDER — MORPHINE SULFATE 2 MG/ML IJ SOLN
2.0000 mg | INTRAMUSCULAR | Status: DC | PRN
  Administered 2013-08-08: 2 mg via INTRAVENOUS
  Filled 2013-08-07: qty 1

## 2013-08-07 MED ORDER — HEPARIN SODIUM (PORCINE) 5000 UNIT/ML IJ SOLN
5000.0000 [IU] | Freq: Three times a day (TID) | INTRAMUSCULAR | Status: DC
  Administered 2013-08-07 – 2013-08-08 (×2): 5000 [IU] via SUBCUTANEOUS
  Filled 2013-08-07 (×5): qty 1

## 2013-08-07 MED ORDER — SODIUM CHLORIDE 0.9 % IV SOLN
INTRAVENOUS | Status: AC
  Administered 2013-08-07: 19:00:00 via INTRAVENOUS
  Administered 2013-08-08: 1000 mL via INTRAVENOUS

## 2013-08-07 MED ORDER — ONDANSETRON HCL 4 MG PO TABS: 4.0000 mg | ORAL_TABLET | Freq: Four times a day (QID) | ORAL | Status: DC | PRN

## 2013-08-07 MED ORDER — MELOXICAM 15 MG PO TABS
15.0000 mg | ORAL_TABLET | Freq: Every day | ORAL | Status: DC
  Administered 2013-08-07 – 2013-08-10 (×4): 15 mg via ORAL
  Filled 2013-08-07 (×4): qty 1

## 2013-08-07 MED ORDER — SODIUM CHLORIDE 0.9 % IV SOLN
1.0000 g | Freq: Two times a day (BID) | INTRAVENOUS | Status: DC
  Administered 2013-08-07 – 2013-08-10 (×5): 1 g via INTRAVENOUS
  Filled 2013-08-07 (×7): qty 1

## 2013-08-07 MED ORDER — CEFEPIME HCL 2 G IJ SOLR
2.0000 g | Freq: Two times a day (BID) | INTRAMUSCULAR | Status: DC
  Administered 2013-08-07 – 2013-08-08 (×2): 2 g via INTRAVENOUS
  Filled 2013-08-07 (×3): qty 2

## 2013-08-07 MED ORDER — ONDANSETRON HCL 4 MG/2ML IJ SOLN: 4.0000 mg | Freq: Four times a day (QID) | INTRAMUSCULAR | Status: DC | PRN

## 2013-08-07 MED ORDER — MIRTAZAPINE 15 MG PO TABS
15.0000 mg | ORAL_TABLET | Freq: Every day | ORAL | Status: DC
  Administered 2013-08-07 – 2013-08-09 (×3): 15 mg via ORAL
  Filled 2013-08-07 (×5): qty 1

## 2013-08-07 MED ORDER — TRAMADOL HCL 50 MG PO TABS
50.0000 mg | ORAL_TABLET | Freq: Four times a day (QID) | ORAL | Status: DC | PRN
  Administered 2013-08-08 – 2013-08-09 (×3): 50 mg via ORAL
  Filled 2013-08-07 (×3): qty 1

## 2013-08-07 MED ORDER — CALCIUM CARBONATE ANTACID 500 MG PO CHEW
1.0000 | CHEWABLE_TABLET | Freq: Every day | ORAL | Status: DC | PRN
  Administered 2013-08-07: 200 mg via ORAL
  Filled 2013-08-07: qty 1

## 2013-08-07 MED ORDER — TRIAMCINOLONE ACETONIDE 0.5 % EX CREA
TOPICAL_CREAM | Freq: Two times a day (BID) | CUTANEOUS | Status: DC
  Administered 2013-08-07 – 2013-08-10 (×5): via TOPICAL
  Filled 2013-08-07: qty 15

## 2013-08-07 MED ORDER — BOOST PLUS PO LIQD
237.0000 mL | Freq: Three times a day (TID) | ORAL | Status: DC
  Administered 2013-08-07 – 2013-08-08 (×4): 237 mL via ORAL
  Filled 2013-08-07 (×12): qty 237

## 2013-08-07 MED ORDER — PREDNISONE 20 MG PO TABS: 20.0000 mg | ORAL_TABLET | Freq: Every day | ORAL | Status: DC

## 2013-08-07 MED ORDER — DIAZEPAM 5 MG PO TABS
2.5000 mg | ORAL_TABLET | Freq: Every day | ORAL | Status: DC | PRN
  Administered 2013-08-08: 2.5 mg via ORAL
  Filled 2013-08-07: qty 1

## 2013-08-07 MED ORDER — GUAIFENESIN-DM 100-10 MG/5ML PO SYRP
5.0000 mL | ORAL_SOLUTION | ORAL | Status: DC | PRN
  Filled 2013-08-07: qty 5

## 2013-08-07 NOTE — H&P (Addendum)
Patient Demographics  Jason [name], is a 80 y.o. male  MRN: [id number]   DOB - Apr 30, 1930  Admit Date - 08/07/2013  Outpatient Primary MD for the patient is ARONSON,RICHARD A, MD   With History of -  Past Medical History  Diagnosis Date  . Arthritis   . Insomnia     takes Trazodone nightly  . PONV (postoperative nausea and vomiting)   . Hyperlipidemia     was on medication but has been off for a while  . Dizziness     was taking Meclizine but doesn't take now;found out that HR was 45  . Joint pain   . Joint swelling   . Itchy skin     scalp and uses a cream  . H/O hiatal hernia     takes Omeprazole daily  . Hemorrhoids   . Constipation   . History of colon polyps   . History of colitis     many yrs ago  . Urinary frequency   . History of kidney stones     passed on his own  . Enlarged prostate   . Anxiety     takes Diazepam daily prn  . Depression     takes Trazodone nightly  . Insomnia     takes Trazodone nightly  . GERD (gastroesophageal reflux disease)   . 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 Right knee arthroscopy    . Hernia repair      double  . Hemorrhoidectomy with hemorrhoid banding    . Tonsillectomy    . Bilateral cataract surgery    . Circumcision    . Colonoscopy    . Esophagogastroduodenoscopy    . Ear cyst excision N/A 04/05/2013    Procedure: CYST REMOVAL;  Surgeon: Ruby Cola, MD;  Location: [place];  Service: ENT;  Laterality: N/A;  . Septoplasty N/A 04/05/2013    Procedure: SEPTOPLASTY;  Surgeon: Ruby Cola, MD;  Location: [facility] OR;  Service: ENT;  Laterality: N/A;  . Direct laryngoscopy N/A 06/26/2013    Procedure: DIRECT LARYNGOSCOPY;  Surgeon: Melida Quitter, MD;  Location: [place];  Service: ENT;  Laterality: N/A;   . Sinus endo w/fusion N/A 07/03/2013    Procedure: ENDOSCOPIC SINUS SURGERY WITH FUSION NAVIGATION with By-Nasopharongeal Biopsy;  Surgeon: Melida Quitter, MD;  Location: [place];  Service: ENT;  Laterality: N/A;  . Sinus exploration Bilateral 07/11/2013    Procedure:  BEDSIDE  SINUS EXPLORATION ;  Surgeon: Melida Quitter, MD;  Location: [place];  Service: ENT;  Laterality: Bilateral;  . Esophagogastroduodenoscopy N/A 07/15/2013    Procedure: ESOPHAGOGASTRODUODENOSCOPY (EGD);  Surgeon: Gwenyth Ober, MD;  Location: [facility] ENDOSCOPY;  Service: General;  Laterality: N/A;  . Peg placement N/A 07/15/2013    Procedure: PERCUTANEOUS ENDOSCOPIC GASTROSTOMY (PEG) PLACEMENT;  Surgeon: Gwenyth Ober, MD;  Location: [place];  Service: General;  Laterality: N/A;  .  Peripherally inserted central catheter insertion      in for   No chief complaint on file.    HPI  [name]  is a 80 y.o. male, with history of sinus infections, who initially presented to the ER around Christmas time in 2014 with dysarthria and dysphagia after a few weeks of left-sided headache. Head CT was negative except for fullness of the nasopharynx. ESR was elevated. He was referred to ENT office of Dr. Redmond Baseman for tongue swelling and was found to have paralysis of the left side of the tongue and soft palate. Fiberoptic exam of the nasopharynx revealed fullness and inflammatory versus neoplastic changes. He was taken for biopsy which revealed only inflammatory tissue. Pain worsened dramatically after biopsy and he was admitted to the hospital on 07/01/13 due to intractable pain, malnutrition, and his other symptoms.    MR imaging was performed indicating an erosive mass of the left nasopharynx/skull base eroding the clivus and petrous apex. He was taken back to the OR for image guided biopsy and the nasopharynx was debrided of white gooey but fibrous tissue surrounded by inflammatory changes. Cultures and histology were taken. He was treated with  Unasyn upon admission but Cipro was then added. Culture results ultimately grew Serratia but gram stain showed GPCs. ID was consulted and he was changed to cefipime. At this point, he was being treated for what appeared to represent skull base osteomyelitis with inflammatory pseudotumor. A minimum of six weeks of IV cefipime was planned so a PICC line was placed. Headache pain improved quite well after nasopharyngeal debridement but worsened at one point. Nasal endoscopy with removal of crusting and drainage from the nasopharynx was performed at the bedside and pain subsequently improved.     Early in his hospital stay in January, a Panda tube was placed through the nose to assist in providing nutrition. Speech pathology worked with him and found he was safe to take a dysphagia 1 diet with thin liquids. He tried to maintain calorie intake without the Panda but was unable so an elective PEG was placed by general surgery. He resumed tube feeds via PEG tube. He also has been having C Spine pain for the last few months and has been seen by Dr Ellene Route for it.    He has been still doing IV cefepime at home given by his wife, however he continues to have generalized headaches, poor appetite, tongue weakness on the left side, dysphagia, was seen by Dr. Johnnye Sima infectious disease physician in the office where a CT scan of his head was evaluated suggestive of skull base osteomyelitis and he was referred to hospital admission for further treatment.    Review of Systems    In addition to the HPI above,  No Fever-positive chills, No Headache, No changes with Vision or hearing, Does have problems swallowing food or Liquids, No Chest pain, Cough or Shortness of Breath, No Abdominal pain, No Nausea or Vommitting, Bowel movements are regular, No Blood in stool or Urine, No dysuria, No new skin rashes or bruises, No new joints pains-aches,  No new weakness, tingling, numbness in any extremity, but left side of  the tongue numb and weak No recent weight gain or loss, No polyuria, polydypsia or polyphagia, No significant Mental Stressors.  A full 10 point Review of Systems was done, except as stated above, all other Review of Systems were negative.   Social History History  Substance Use Topics  . Smoking status: Former Smoker  Quit date: 07/01/1993  . Smokeless tobacco: Former Systems developer    Types: [place] date: 12/29/2012     Comment: quit chewing tobacco several months ago and stopped smoking cigars yrs ago  . Alcohol Use: No      Family History No history of CAD at young age  Prior to Admission medications   Medication Sig Start Date End Date Taking? Authorizing Provider  acetaminophen (TYLENOL) 325 MG tablet Take 650 mg by mouth every 6 (six) hours as needed.   Yes Historical Provider, MD  betamethasone dipropionate (DIPROLENE) 0.05 % cream Apply 1 application topically daily as needed (itchy scalp).   Yes Historical Provider, MD  calcium carbonate (TUMS) 500 MG chewable tablet Chew 1 tablet by mouth daily as needed for indigestion or heartburn.   Yes Historical Provider, MD  dextrose 5 % SOLN 50 mL with ceFEPIme 2 G SOLR 2 g Inject 2 g into the vein every 12 (twelve) hours. 07/18/13 08/22/13 Yes Melida Quitter, MD  diazepam (VALIUM) 5 MG tablet Take 2.5 mg by mouth daily as needed for anxiety.    Yes Historical Provider, MD  meloxicam (MOBIC) 15 MG tablet Take 15 mg by mouth daily.   Yes Historical Provider, MD  mirtazapine (REMERON) 15 MG tablet Take 15 mg by mouth at bedtime.  07/31/13  Yes Historical Provider, MD  traMADol (ULTRAM) 50 MG tablet Take 50 mg by mouth every 6 (six) hours as needed.   Yes Historical Provider, MD  predniSONE (DELTASONE) 20 MG tablet Take 1 tablet (20 mg total) by mouth daily. 07/18/13   Melida Quitter, MD    Allergies  Allergen Reactions  . Bee Pollen Anaphylaxis  . Tetanus Toxoids Swelling    Tetanus Shot  . Hydrocodone Other (See Comments)    Confusion,  "talking out of his head"    Physical Exam  Vitals  Blood pressure 102/61, pulse 75, temperature 98 F (36.7 C), temperature source Oral, resp. rate 18, height _0  (1.575 m), weight 59.013 kg (130 lb 1.6 oz), SpO2 98.00%.   1. General frail elderly white male lying in bed in NAD,     2. Normal affect and insight, Not Suicidal or Homicidal, Awake Alert, Oriented X 3.  3. No F.N deficits, ALL C.Nerves Intact, Strength 5/5 all 4 extremities, Sensation intact all 4 extremities, Plantars down going.  4. Ears and Eyes appear Normal, Conjunctivae clear, PERRLA. Moist Oral Mucosa, left side of the tongue is numb  5. Supple Neck, No JVD, No cervical lymphadenopathy appriciated, No Carotid Bruits.  6. Symmetrical Chest wall movement, Good air movement bilaterally, CTAB.  7. RRR, No Gallops, Rubs or Murmurs, No Parasternal Heave.  8. Positive Bowel Sounds, Abdomen Soft PEG tube in place, Non tender, No organomegaly appriciated,No rebound -guarding or rigidity.  9.  No Cyanosis, Normal Skin Turgor, No Skin Rash or Bruise.  10. Good muscle tone,  joints appear normal , no effusions, Normal ROM.  11. No Palpable Lymph Nodes in Neck or Axillae     Data Review  CBC No results found for this basename: WBC, HGB, HCT, PLT, MCV, MCH, MCHC, RDW, NEUTRABS, LYMPHSABS, MONOABS, EOSABS, BASOSABS, BANDABS, BANDSABD,  in the last 168 hours ------------------------------------------------------------------------------------------------------------------  Chemistries  No results found for this basename: NA, K, CL, CO2, GLUCOSE, BUN, CREATININE, GFRCGP, CALCIUM, MG, AST, ALT, ALKPHOS, BILITOT,  in the last 168 hours ------------------------------------------------------------------------------------------------------------------ estimated creatinine clearance is 55.9 ml/min (by C-G formula based on Cr of  0.73). ------------------------------------------------------------------------------------------------------------------  No results found for this basename: TSH, T4TOTAL, FREET3, T3FREE, THYROIDAB,  in the last 72 hours   Coagulation profile No results found for this basename: INR, PROTIME,  in the last 168 hours ------------------------------------------------------------------------------------------------------------------- No results found for this basename: DDIMER,  in the last 72 hours -------------------------------------------------------------------------------------------------------------------  Cardiac Enzymes No results found for this basename: CK, CKMB, TROPONINI, MYOGLOBIN,  in the last 168 hours ------------------------------------------------------------------------------------------------------------------ No components found with this basename: POCBNP,    ---------------------------------------------------------------------------------------------------------------  Urinalysis    Component Value Date/Time   COLORURINE YELLOW 07/06/2013 1156   APPEARANCEUR CLOUDY* 07/06/2013 1156   LABSPEC 1.012 07/06/2013 1156   PHURINE 5.5 07/06/2013 1156   GLUCOSEU NEGATIVE 07/06/2013 [place] 07/06/2013 [place] 07/06/2013 [place] 07/06/2013 1156   PROTEINUR NEGATIVE 07/06/2013 1156   UROBILINOGEN 1.0 07/06/2013 1156   NITRITE NEGATIVE 07/06/2013 1156   LEUKOCYTESUR TRACE* 07/06/2013 1156    ----------------------------------------------------------------------------------------------------------------  Imaging results:      Ct Temporal Bones W/cm  08/02/2013   CLINICAL DATA:  Left hypoglossal neuropathy. Skull base osteomyelitis. Prior surgery  EXAM: CT TEMPORAL BONES WITH CONTRAST  TECHNIQUE: Axial and coronal plane CT imaging of the petrous temporal bones was performed with thin-collimation image reconstruction after intravenous  contrast administration. Multiplanar CT image reconstructions were also generated.  CONTRAST:  82m OMNIPAQUE IOHEXOL 300 MG/ML  SOLN  COMPARISON:  CT sinus 07/02/2013  FINDINGS: Destructive process in the left skullbase, consistent with osteomyelitis which has been proven by tissue sampling in the operating room. There is erosion of the hypoglossal canal on the left consistent with a history of hypoglossal neuropathy. This is consistent with infection of the canal. There is also erosion of the left side of the clivus and left sphenoid bone. There is erosion of the wall of the carotid canal consistent with osteomyelitis. There is increased edema within the carotid canal and the left internal carotid artery could be infected. The left internal carotid artery appears patent. There are gas bubbles below the carotid canal which may be related to recent biopsy or could be related to the infection.  There is edema surrounding the left jugular vein which may be narrowed or occluded. There is filling defect in the left transverse sinus compatible with thrombus. Right transverse sinus is normal. The cavernous sinus is symmetric without evidence of venous thrombosis.  The left mastoid sinus is clear aside from some minimal mucosal edema in the mastoid tip. This has improved since the prior CT. Left middle ear is clear. The right mastoid sinus and middle ear are clear. Visualized paranasal sinuses reveal mild mucosal edema in the maxillary sinuses.  No intracranial abscess is identified on these limited images of the brain. If there is concern of cerebritis, MRI with contrast is suggested.  IMPRESSION: Destructive mass of the left skullbase consistent with osteomyelitis. There is erosion of the hypoglossal canal on the left, consistent with osteomyelitis and hypoglossal neuropathy. There is destruction of bone around the carotid canal on the left raising the possibility of carotid infection. There appears to be flow in the  left internal carotid artery.  There is a small amount of thrombus in the left transverse sinus. Left jugular vein appears patent however there is considerable edema around the jugular bulb which may or may not be patent.  Bony destruction is similar to the prior CT.   Electronically Signed   By: CFranchot GalloM.D.   On: 08/02/2013 16:23  Assessment & Plan   1. Skull base osteomyelitis - will be admitted to [place] bed, have discussed his case with ID physician Dr. Megan Salon who will see the patient tomorrow, blood cultures will be drawn, he will be placed on IV vancomycin and meropenem, D/W ENT physician Dr. Redmond Baseman over the phone he will see the patient in am requests MRI-V Brain. The I have also ordered MRI of the brain and C-spine as he has on-and-off complaint of C-spine discomfort, he was scheduled to see Dr. Ellene Route and I have left a message for him also on his cell phone which is [phone]. Note he recently finished his outpatient oral prednisone taper which will be held. If he becomes hypotensive with symptoms we will consider stress dose IV hydrocortisone.     2. Left-sided tongue weakness, dysphagia. Secondary to base of the skull osteomyelitis. Supportive care as in #1, will have speech see him, had extensive speech evaluation last admission and was placed on dysphagia 1 diet which will be started, will request speech to evaluate again.      3. Moderate to severe protein calorie malnutrition due to #1 and 2 above - dysphagia 1 diet orally with aspiration precautions, protein supplementations through 2, nutritionist consult.     4. ? Juglar Vein edema - obtain MRV brain per ENT   DVT Prophylaxis Heparin    AM Labs Ordered, also please review Full Orders  Family Communication: Admission, patients condition and plan of care including tests being ordered have been discussed with the patient and  wife who indicate understanding and agree with the plan and Code  Status.  Code Status full  Likely DC to home  Condition GUARDED    Time spent in minutes : 35    Adarius Tigges K M.D on 08/07/2013 at 5:53 PM  Between 7am to 7pm - Pager - [phone]  After 7pm go to [email or web address] - password [email or web address]  And look for the night coverage person covering me after hours  [facility] Office  [phone]

## 2013-08-07 NOTE — Assessment & Plan Note (Addendum)
The patient has received nearly 5 weeks of IV anbx at this point and his CT scan is remarkable for a mass and continued osteomyelitis. I have discussed his case with Dr Danielle DessElsner and he will review his CT and see the patient in the hospital.He will also be scheduled for MRI. Would change his anbx to vanco/merrem. The pt is equivocal if he wants further surgery.  [facility]-surg [facility].

## 2013-08-07 NOTE — Progress Notes (Signed)
Patient is well-known to my partner, Dr. Jenne PaneBates. I have relayed the information and request for consultation. He will speak with Dr. Thedore MinsSingh  and attempt to help them sort this out.

## 2013-08-07 NOTE — Progress Notes (Signed)
Jason [name] [id number] Admission Data: 08/07/2013 5:54 PM Attending Provider: Leroy SeaPrashant K Singh, MD  NWG:NFAOZHY,QMVHQIOPCP:ARONSON,RICHARD A, MD Consults/ Treatment Team:    Jason [name] is a 80 y.o. male patient  Direct admit  awake, alert  & orientated  X 3,  Full Code, VSS - Blood pressure 102/61, pulse 75, temperature 98 F (36.7 C), temperature source Oral, resp. rate 18, height 5\' 2"  (1.575 m), weight 59.013 kg (130 lb 1.6 oz), SpO2 98.00%.,IV site WDL:  upper arm right PICC, condition patent and no redness with a transparent dsg that's clean dry and intact.  Allergies:   Allergies  Allergen Reactions  . Bee Pollen Anaphylaxis  . Tetanus Toxoids Swelling    Tetanus Shot  . Hydrocodone Other (See Comments)    Confusion, "talking out of his head"     Past Medical History  Diagnosis Date  . Arthritis   . Insomnia     takes Trazodone nightly  . PONV (postoperative nausea and vomiting)   . Hyperlipidemia     was on medication but has been off for a while  . Dizziness     was taking Meclizine but doesn't take now;found out that HR was 45  . Joint pain   . Joint swelling   . Itchy skin     scalp and uses a cream  . H/O hiatal hernia     takes Omeprazole daily  . Hemorrhoids   . Constipation   . History of colon polyps   . History of colitis     many yrs ago  . Urinary frequency   . History of kidney stones     passed on his own  . Enlarged prostate   . Anxiety     takes Diazepam daily prn  . Depression     takes Trazodone nightly  . Insomnia     takes Trazodone nightly  . GERD (gastroesophageal reflux disease)   . 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   Pt orientation to unit, room and routine. Information packet given to patient/family and safety video watched.  Admission INP armband ID verified with patient/family, and in place. SR up x 2, fall risk assessment complete with Patient and family verbalizing understanding of risks associated  with falls. Pt verbalizes an understanding of how to use the call bell and to call for help before getting out of bed.  Skin, clean-dry- intact without evidence of bruising, or skin tears.   No evidence of skin break down noted on exam.     Will cont to monitor and assist as needed.  Cindra EvesCelano, Naysha Sholl [place]i, RN 08/07/2013 5:54 PM

## 2013-08-07 NOTE — Progress Notes (Signed)
   Subjective:    Patient ID: Jason SchultzeBrice A Belko, male    DOB: 23-Apr-1930, 80 y.o.   MRN: [id number]  HPI 80 yo M who underwent left nasal polypectomy in October 2014. He then develop some severe left-sided headache leading to her admission to the hospital January 2015. CT scan showed some erosion of the left clivus and features of apex. Endoscopy revealed diffuse soft tissue inflammation and a biopsy showed nonspecific acute sinusitis. Gram stain of tissue showed gram-positive cocci in pairs and the culture grew Serratia. He had been on antibiotics for several days before cultures were obtained. He was seen by my partner, Dr. Drue SecondSnider, who recommended IV cefepime. He was seen in ID clinic on 1-29 and had completed 21 days of antibiotic therapy.  He underwent repeat CT head on 08-02-13 showing: Destructive mass of the left skullbase consistent with osteomyelitis. There is erosion of the hypoglossal canal on the left, consistent with osteomyelitis and hypoglossal neuropathy.There is destruction of bone around the carotid canal on the left raising the possibility of carotid infection. There appears to be flow in the left internal carotid artery. There is a small amount of thrombus in the left transverse sinus. Left jugular vein appears patent however there is considerable edema around the jugular bulb which may or may not be patent. Bony destruction is similar to the prior CT. Today complains of severe headache, dizziness. Wife feels like his nose has been draining more material. No fever (98 has been his very top), has been sitting in from of a heater constantly, but has had sweats.   Has previously seen Dr Danielle DessElsner for eval of cervical surgery.   Review of Systems  Constitutional: Positive for appetite change. Negative for fever, chills and unexpected weight change.  HENT: Positive for sinus pressure.   Gastrointestinal: Negative for diarrhea and constipation.  Genitourinary: Negative for difficulty urinating.   Neurological: Positive for dizziness and headaches.  no hearing from L ear. No problems with PIC line.      Objective:   Physical Exam  Constitutional: He appears well-developed.  HENT:  Left Ear: There is drainage.  Ears:  Mouth/Throat: No oropharyngeal exudate.  Eyes: EOM are normal. Pupils are equal, round, and reactive to light.  Neck: Normal range of motion. Neck supple.  Cardiovascular: Normal rate, regular rhythm and normal heart sounds.   Pulmonary/Chest: Effort normal and breath sounds normal.  Abdominal: Soft. Bowel sounds are normal. He exhibits no distension. There is no tenderness.    Lymphadenopathy:    He has no cervical adenopathy.          Assessment & Plan:

## 2013-08-07 NOTE — Progress Notes (Addendum)
ANTIBIOTIC CONSULT NOTE - INITIAL  Pharmacy Consult for Vancomycin, Meropenem (also on cefepime) Indication: skull osteomyelitis  Allergies  Allergen Reactions  . Bee Pollen Anaphylaxis  . Tetanus Toxoids Swelling    Tetanus Shot  . Hydrocodone Other (See Comments)    Confusion, "talking out of his head"    Patient Measurements: Height: 5\' 2"  (157.5 cm) Weight: 130 lb 1.6 oz (59.013 kg) IBW/kg (Calculated) : 54.6  Vital Signs: Temp: 98 F (36.7 C) (02/11 1706) Temp src: Oral (02/11 1706) BP: 102/61 mmHg (02/11 1706) Pulse Rate: 75 (02/11 1706)  Labs: No results found for this basename: WBC, HGB, PLT, LABCREA, CREATININE,  in the last 72 hours Estimated Creatinine Clearance: 55.9 ml/min (by C-G formula based on Cr of 0.73). No results found for this basename: VANCOTROUGH, VANCOPEAK, VANCORANDOM, GENTTROUGH, GENTPEAK, GENTRANDOM, TOBRATROUGH, TOBRAPEAK, TOBRARND, AMIKACINPEAK, AMIKACINTROU, AMIKACIN,  in the last 72 hours   Microbiology: No results found for this or any previous visit (from the past 720 hour(s)).  Medical History: Past Medical History  Diagnosis Date  . Arthritis   . Insomnia     takes Trazodone nightly  . PONV (postoperative nausea and vomiting)   . Hyperlipidemia     was on medication but has been off for a while  . Dizziness     was taking Meclizine but doesn't take now;found out that HR was 45  . Joint pain   . Joint swelling   . Itchy skin     scalp and uses a cream  . H/O hiatal hernia     takes Omeprazole daily  . Hemorrhoids   . Constipation   . History of colon polyps   . History of colitis     many yrs ago  . Urinary frequency   . History of kidney stones     passed on his own  . Enlarged prostate   . Anxiety     takes Diazepam daily prn  . Depression     takes Trazodone nightly  . Insomnia     takes Trazodone nightly    Assessment: 80 year old male who underwent nasal polypectomy in October, 2014.  He was readmitted in  January of 2015 with acute sinuitis with culture growing Serratia.  He completed 21 days of cefepime therapy on January 29.  Head CT on 08/02/13 showed destructive mass of the left skull base consistent with osteomyelitis.   He presents today with complaints of severe headache and dizziness.  Pharmacy asked to dose meropenem and vancomycin.  CrCl = 56 ml/min  Goal of Therapy:  Vancomycin trough level 15-20 mcg/ml Appropriate meropenem dosing  Plan:  1) Cefepime 1 Gram iv Q 12 hours 2) Meropenem 1 Gram iv Q 12 hours 3) Vancomycin 500 mg iv Q 12 hours 4) Follow up cultures, Scr, overall plan  Thank you. Okey RegalLisa Jamela Cumbo, PharmD [phone]   Jason SleightPowell, Jason [name] Kay 08/07/2013,5:28 PM

## 2013-08-08 ENCOUNTER — Inpatient Hospital Stay (HOSPITAL_COMMUNITY): Payer: Medicare Other

## 2013-08-08 DIAGNOSIS — J329 Chronic sinusitis, unspecified: Secondary | ICD-10-CM

## 2013-08-08 LAB — BASIC METABOLIC PANEL
BUN: 23 mg/dL (ref 6–23)
CHLORIDE: 105 meq/L (ref 96–112)
CO2: 22 mEq/L (ref 19–32)
CREATININE: 0.72 mg/dL (ref 0.50–1.35)
Calcium: 8.3 mg/dL — ABNORMAL LOW (ref 8.4–10.5)
GFR calc non Af Amer: 85 mL/min — ABNORMAL LOW (ref >90)
Glucose, Bld: 94 mg/dL (ref 70–99)
Potassium: 4.3 mEq/L (ref 3.7–5.3)
Sodium: 140 mEq/L (ref 137–147)

## 2013-08-08 LAB — CBC
HEMATOCRIT: 30.5 % — AB (ref 39.0–52.0)
Hemoglobin: 10.1 g/dL — ABNORMAL LOW (ref 13.0–17.0)
MCH: 28.7 pg (ref 26.0–34.0)
MCHC: 33.1 g/dL (ref 30.0–36.0)
MCV: 86.6 fL (ref 78.0–100.0)
Platelets: 159 10*3/uL (ref 150–400)
RBC: 3.52 MIL/uL — ABNORMAL LOW (ref 4.22–5.81)
RDW: 16.5 % — AB (ref 11.5–15.5)
WBC: 9.8 10*3/uL (ref 4.0–10.5)

## 2013-08-08 MED ORDER — ALUM & MAG HYDROXIDE-SIMETH 200-200-20 MG/5ML PO SUSP
30.0000 mL | Freq: Three times a day (TID) | ORAL | Status: DC | PRN
  Administered 2013-08-08 – 2013-08-09 (×2): 30 mL via ORAL
  Filled 2013-08-08 (×2): qty 30

## 2013-08-08 MED ORDER — JEVITY 1.2 CAL PO LIQD
480.0000 mL | Freq: Three times a day (TID) | ORAL | Status: DC
  Administered 2013-08-08 – 2013-08-09 (×4): 480 mL
  Filled 2013-08-08 (×13): qty 711

## 2013-08-08 MED ORDER — RISAQUAD PO CAPS
1.0000 | ORAL_CAPSULE | Freq: Every day | ORAL | Status: DC
  Administered 2013-08-08 – 2013-08-10 (×3): 1 via ORAL
  Filled 2013-08-08 (×3): qty 1

## 2013-08-08 MED ORDER — RIVAROXABAN 10 MG PO TABS
10.0000 mg | ORAL_TABLET | Freq: Every day | ORAL | Status: DC
  Administered 2013-08-08 – 2013-08-09 (×2): 10 mg via ORAL
  Filled 2013-08-08 (×3): qty 1

## 2013-08-08 NOTE — Progress Notes (Signed)
ANTICOAGULATION CONSULT NOTE - Initial Consult  Pharmacy Consult for Xarelto Indication: VTE prophylaxis  Allergies  Allergen Reactions  . Bee Pollen Anaphylaxis  . Tetanus Toxoids Swelling    Tetanus Shot  . Hydrocodone Other (See Comments)    Confusion, "talking out of his head"    Patient Measurements: Height: 5\' 2"  (157.5 cm) Weight: 130 lb 12.8 oz (59.33 kg) IBW/kg (Calculated) : 54.6 Heparin Dosing Weight: n/a  Vital Signs: Temp: 98.7 F (37.1 C) (02/12 0620) Temp src: Oral (02/12 0620) BP: 113/62 mmHg (02/12 0620) Pulse Rate: 72 (02/12 0620)  Labs:  Recent Labs  08/07/13 1650 08/08/13 0640  HGB 10.6* 10.1*  HCT 31.4* 30.5*  PLT 161 159  CREATININE 0.78 0.72    Estimated Creatinine Clearance: 55.9 ml/min (by C-G formula based on Cr of 0.72).   Medical History: Past Medical History  Diagnosis Date  . Arthritis   . Insomnia     takes Trazodone nightly  . PONV (postoperative nausea and vomiting)   . Hyperlipidemia     was on medication but has been off for a while  . Dizziness     was taking Meclizine but doesn't take now;found out that HR was 45  . Joint pain   . Joint swelling   . Itchy skin     scalp and uses a cream  . H/O hiatal hernia     takes Omeprazole daily  . Hemorrhoids   . Constipation   . History of colon polyps   . History of colitis     many yrs ago  . Urinary frequency   . History of kidney stones     passed on his own  . Enlarged prostate   . Anxiety     takes Diazepam daily prn  . Depression     takes Trazodone nightly  . Insomnia     takes Trazodone nightly  . GERD (gastroesophageal reflux disease)   . 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 acetaminophen (TYLENOL) 325 MG tablet Take 650 mg by mouth every 6 (six) hours as needed.      . betamethasone dipropionate (DIPROLENE) 0.05 % cream Apply 1 application topically daily as needed (itchy  scalp).      . calcium carbonate (TUMS) 500 MG chewable tablet Chew 1 tablet by mouth daily as needed for indigestion or heartburn.      . diazepam (VALIUM) 5 MG tablet Take 2.5 mg by mouth daily as needed for anxiety.       . meloxicam (MOBIC) 15 MG tablet Take 15 mg by mouth daily.      . mirtazapine (REMERON) 15 MG tablet Take 15 mg by mouth at bedtime.       . traMADol (ULTRAM) 50 MG tablet Take 50 mg by mouth every 6 (six) hours as needed.      Marland Kitchen dextrose 5 % SOLN 50 mL with ceFEPIme 2 G SOLR 2 g Inject 2 g into the vein every 12 (twelve) hours.  140 g  0  . predniSONE (DELTASONE) 20 MG tablet Take 1 tablet (20 mg total) by mouth daily.  7 tablet  0    Assessment: Patient to start on Xarelto for VTE prophylaxis. Discussed patient with Dr. Thedore Mins who ruled out heparin and lovenox b/c patient does not want to be stuck with a syringe. This would be a off-label use of the drug. It is important to  realize that Xarelto does not have reversal agent yet and given the patient's renal fx (CrCl ~ 56 mL/min), it may take more than one day to be entirely eliminated. This could present a challenge if patient would require surgery. H/H 10.1/30.5. Plt 159.   Goal of Therapy:  VTE prophylaxis Monitor platelets by anticoagulation protocol: Yes   Plan:  1) Start Xarelto 10 mg daily with evening meal 2) F/u CBC, renal fx, and s/s of bleeding   Vinnie LevelBenjamin Traniya Prichett, PharmD.  Clinical Pharmacist Pager [phone]

## 2013-08-08 NOTE — Progress Notes (Addendum)
INITIAL NUTRITION ASSESSMENT  DOCUMENTATION CODES Per approved criteria  -Severe malnutrition in the context of chronic illness   INTERVENTION: Provide 2 cans of Jevity 1.2 via PEG TID to provide 1710 kcal, 79 grams of protein, and 1146 ml of H2O.  Diet texture and liquid consistency per SLP. RD to continue to monitor.  NUTRITION DIAGNOSIS: Inadequate oral intake related to dysphagia, lethargy as evidenced by wife's report.   Goal: Intake to meet at least 90% of estimated needs.  Monitor:  Weights, labs, intake, TF initiation, PO intake  Reason for Assessment: MD Consult for TF Initiation and Management  80 y.o. male  Admitting Dx: skull base osteomyelitis  ASSESSMENT: PMHx significant for sinus infections. Admitted with generalized HA, poor appetite, tongue weakness, dysphagia. Also s/p head CT suggestive of skull base osteomyelitis. Work-up ongoing.  Pt seen by RD staff during acute hospitalization in January. Patient was started on enteral nutrition via NGT on 1/8. Calorie count completed 1/13 - 1/15 revealing inadequate intake. PEG placed on 1/19. Wife reports that since pt has left the hospital, he has been taking 2 cans of Jevity 1.2 via PEG TID. She states that she will give it to him before or after meals, depending on the day. Oral intake of meals is limited to things such as grits, eggs, and milkshakes.   Currently ordered for Boost TID between meals.  Pt was diagnosed with malnutrition during previous hospitalization, pt meets criteria for severe MALNUTRITION in the context of chronic illness as evidenced by <75% estimated energy intake with 8% weight loss in the past 3 months. Of note, pt has gained approximately 5 lb since previous admission.  Height: Ht Readings from Last 1 Encounters:  08/07/13 5\' 2"  (1.575 m)    Weight: Wt Readings from Last 1 Encounters:  08/08/13 130 lb 12.8 oz (59.33 kg)    Ideal Body Weight: 118 lb   % Ideal Body Weight: 110%  Wt  Readings from Last 10 Encounters:  08/08/13 130 lb 12.8 oz (59.33 kg)  08/07/13 133 lb (60.328 kg)  07/25/13 125 lb (56.7 kg)  07/18/13 125 lb 11.2 oz (57.017 kg)  07/18/13 125 lb 11.2 oz (57.017 kg)  07/18/13 125 lb 11.2 oz (57.017 kg)  07/18/13 125 lb 11.2 oz (57.017 kg)  06/26/13 124 lb (56.246 kg)  06/26/13 124 lb (56.246 kg)  04/03/13 142 lb 1.6 oz (64.456 kg)    Usual Body Weight: 141 lb per wife  % Usual Body Weight: 92%  BMI:  Body mass index is 23.92 kg/(m^2). Normal weight  Estimated Nutritional Needs: Kcal: 1750-2050 Protein: 90-110g Fluid: 1.7-2L/day  Skin: nose incision  Diet Order: Dysphagia 1; thin liquids  EDUCATION NEEDS: -No education needs identified at this time   Intake/Output Summary (Last 24 hours) at 08/08/13 0824 Last data filed at 08/08/13 0655  Gross per 24 hour  Intake    685 ml  Output    950 ml  Net   -265 ml    Last BM: 2/11  Labs:   Recent Labs Lab 08/07/13 1650 08/08/13 0640  NA 139 140  K 4.5 4.3  CL 101 105  CO2 25 22  BUN 28* 23  CREATININE 0.78 0.72  CALCIUM 8.6 8.3*  GLUCOSE 106* 94    CBG (last 3)  No results found for this basename: GLUCAP,  in the last 72 hours  Scheduled Meds: . ceFEPIme (MAXIPIME) 2 GM IVP  2 g Intravenous Q12H  . heparin  5,000 Units Subcutaneous  3 times per day  . lactose free nutrition  237 mL Oral TID PC & HS  . meloxicam  15 mg Oral Daily  . meropenem (MERREM) IV  1 g Intravenous Q12H  . mirtazapine  15 mg Oral QHS  . triamcinolone cream   Topical BID  . vancomycin  500 mg Intravenous Q12H    Continuous Infusions: . sodium chloride 75 mL/hr at 08/07/13 1836    Past Medical History  Diagnosis Date  . Arthritis   . Insomnia     takes Trazodone nightly  . PONV (postoperative nausea and vomiting)   . Hyperlipidemia     was on medication but has been off for a while  . Dizziness     was taking Meclizine but doesn't take now;found out that HR was 45  . Joint pain   .  Joint swelling   . Itchy skin     scalp and uses a cream  . H/O hiatal hernia     takes Omeprazole daily  . Hemorrhoids   . Constipation   . History of colon polyps   . History of colitis     many yrs ago  . Urinary frequency   . History of kidney stones     passed on his own  . Enlarged prostate   . Anxiety     takes Diazepam daily prn  . Depression     takes Trazodone nightly  . Insomnia     takes Trazodone nightly  . GERD (gastroesophageal reflux disease)   . 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 Right knee arthroscopy    . Hernia repair      double  . Hemorrhoidectomy with hemorrhoid banding    . Tonsillectomy    . Bilateral cataract surgery    . Circumcision    . Colonoscopy    . Esophagogastroduodenoscopy    . Ear cyst excision N/A 04/05/2013    Procedure: CYST REMOVAL;  Surgeon: Melvenia BeamMitchell Gore, MD;  [facility];  Service: ENT;  Laterality: N/A;  . Septoplasty N/A 04/05/2013    Procedure: SEPTOPLASTY;  Surgeon: Melvenia BeamMitchell Gore, MD;  [facility];  Service: ENT;  Laterality: N/A;  . Direct laryngoscopy N/A 06/26/2013    Procedure: DIRECT LARYNGOSCOPY;  Surgeon: Christia Readingwight Bates, MD;  [facility];  Service: ENT;  Laterality: N/A;  . Sinus endo w/fusion N/A 07/03/2013    Procedure: ENDOSCOPIC SINUS SURGERY WITH FUSION NAVIGATION with By-Nasopharongeal Biopsy;  Surgeon: Christia Readingwight Bates, MD;  [facility];  Service: ENT;  Laterality: N/A;  . Sinus exploration Bilateral 07/11/2013    Procedure:  BEDSIDE  SINUS EXPLORATION ;  Surgeon: Christia Readingwight Bates, MD;  [facility];  Service: ENT;  Laterality: Bilateral;  . Esophagogastroduodenoscopy N/A 07/15/2013    Procedure: ESOPHAGOGASTRODUODENOSCOPY (EGD);  Surgeon: Cherylynn RidgesJames O Wyatt, MD;  [facility];  Service: General;  Laterality: N/A;  . Peg placement N/A 07/15/2013    Procedure: PERCUTANEOUS ENDOSCOPIC GASTROSTOMY (PEG) PLACEMENT;  Surgeon: Cherylynn RidgesJames O Wyatt, MD;  [facility];  Service: General;  Laterality: N/A;  . Peripherally inserted central catheter insertion      Jarold MottoSamantha Haasini Patnaude MS, RD, LDN Inpatient Registered Dietitian Pager: [phone] After-hours pager: [phone]

## 2013-08-08 NOTE — Care Management Note (Signed)
    Page 1 of 2   08/09/2013     12:01:20 PM   CARE MANAGEMENT NOTE 08/09/2013  Patient:  Jason [name],Jason [name]   Account Number:  [id number]  Date Initiated:  08/08/2013  Documentation initiated by:  Jason [name],Jason [name]  Subjective/Objective Assessment:   dx ostomylitis ov skull  admit- lives wiht spouse. [facility], and [facility].     Action/Plan:   Anticipated DC Date:  08/10/2013   Anticipated DC Plan:  HOME W HOME HEALTH SERVICES      DC Planning Services  CM consult      [facility]  Resumption Of Svcs/PTA Provider   Choice offered to / List presented to:  C-1 Patient        HH arranged  HH-1 [name]  HH-2 PT      [name]   Status of service:  Completed, signed off Medicare Important Message given?   (If response is "NO", the following Medicare IM given date fields will be blank) Date Medicare IM given:   Date Additional Medicare IM given:    Discharge Disposition:  HOME W HOME HEALTH SERVICES  Per UR Regulation:  Reviewed for med. necessity/level of care/duration of stay  If discussed at Long Length of Stay Meetings, dates discussed:    Comments:  08/10/13 1158 Jason [name], Jason [name] [phone] patient lives with spouse, will dc to home tomorrow with Home health iv abx with Jason [name], Jason [name] came up to get the scripts for the abx, she spoke with spouse and informed her patient will go home tomorrow and neurology will see patient today.  08/08/13 1710 Jason [name], Jason [name] [phone] patient lives with spouse, [name],  NCM informed Jason [name] with [facility] that patient will be for dc tomorrow and will need IV abx.

## 2013-08-08 NOTE — Progress Notes (Signed)
Utilization review completed.  

## 2013-08-08 NOTE — Progress Notes (Signed)
Patient ID: Jason SchultzeBrice A Parcel, male   DOB: [birth year], 80 y.o.   MRN: [id number] Patient well-known to me with osteomyelitis of skull base with associated left hypoglossal and glossopharyngeal palsy, dysarthria, dysphagia, headache, etc.  Culture from area grew Serratia.  Has been treated with IV cefipime as an outpatient.  His recent CT with contrast showed fairly stable bony erosion of clivus, petrous apex, and associated skull base but a filling defect in the left transverse sinus.  He saw Dr. Ninetta LightsHatcher in clinic yesterday and was admitted to the hospital last night.  Today, he continues to have trouble swallowing.  He had been supplementing via G-tube at home.  He is depressed.  He is not having much in way of headache.  AFVSS Alert.  NAD.  Depressed affect. Left ear with patent tympanostomy tube in place. Nose normal. Tongue weak on left.  Soft palate weak on left.  A: Skull base osteomyelitis with inflammatory pseudotumor, left glossopharygneal and hypoglossal palsy, dysphagia, dysarthria, malnutrition, depression, left transverse sinus thrombus  P:  I had been in discussions yesterday and the day before with Dr. Drue SecondSnider of ID, Dr. Danielle DessElsner of Neurosurgery, the radiologist, and his wife discussing CT findings and plans.  A brain MRI/MRV was planned as an outpatient per Dr. Verlee RossettiElsner's recommendation to determine patency of the left dural sinus/jugular system.  If some degree of patency remained, he favored anti-coagulating the patient to try to prevent full occlusion.  If the system is already occluded, he did not feel that anti-coagulation would be worthwhile.  It seems that the patient was admitted yesterday due to concerns over progression on CT imaging and in order to change his antibiotics.  I discussed this with Dr. Drue SecondSnider of ID today and the patient and his wife.  At this point, I do not believe that there is a viable surgical option but I believe Dr. Danielle DessElsner has been consulted also.

## 2013-08-08 NOTE — Evaluation (Signed)
Clinical/Bedside Swallow Evaluation Patient Details  Name: Jason [name] MRN: [id number] Date of Birth: [birth year]  Today's Date: 08/08/2013 Time: 0539-7673 SLP Time Calculation (min): 18 min  Past Medical History:  Past Medical History  Diagnosis Date  . Arthritis   . Insomnia     takes Trazodone nightly  . PONV (postoperative nausea and vomiting)   . Hyperlipidemia     was on medication but has been off for a while  . Dizziness     was taking Meclizine but doesn't take now;found out that HR was 45  . Joint pain   . Joint swelling   . Itchy skin     scalp and uses a cream  . H/O hiatal hernia     takes Omeprazole daily  . Hemorrhoids   . Constipation   . History of colon polyps   . History of colitis     many yrs ago  . Urinary frequency   . History of kidney stones     passed on his own  . Enlarged prostate   . Anxiety     takes Diazepam daily prn  . Depression     takes Trazodone nightly  . Insomnia     takes Trazodone nightly  . GERD (gastroesophageal reflux disease)   . 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 Right knee arthroscopy    . Hernia repair      double  . Hemorrhoidectomy with hemorrhoid banding    . Tonsillectomy    . Bilateral cataract surgery    . Circumcision    . Colonoscopy    . Esophagogastroduodenoscopy    . Ear cyst excision N/A 04/05/2013    Procedure: CYST REMOVAL;  Surgeon: Ruby Cola, MD;  Location: [place];  Service: ENT;  Laterality: N/A;  . Septoplasty N/A 04/05/2013    Procedure: SEPTOPLASTY;  Surgeon: Ruby Cola, MD;  [facility];  Service: ENT;  Laterality: N/A;  . Direct laryngoscopy N/A 06/26/2013    Procedure: DIRECT LARYNGOSCOPY;  Surgeon: Melida Quitter, MD;  Location: [place];  Service: ENT;  Laterality: N/A;  . Sinus endo w/fusion N/A 07/03/2013    Procedure: ENDOSCOPIC SINUS SURGERY WITH FUSION NAVIGATION with By-Nasopharongeal Biopsy;   Surgeon: Melida Quitter, MD;  Location: [place];  Service: ENT;  Laterality: N/A;  . Sinus exploration Bilateral 07/11/2013    Procedure:  BEDSIDE  SINUS EXPLORATION ;  Surgeon: Melida Quitter, MD;  Location: [place];  Service: ENT;  Laterality: Bilateral;  . Esophagogastroduodenoscopy N/A 07/15/2013    Procedure: ESOPHAGOGASTRODUODENOSCOPY (EGD);  Surgeon: Gwenyth Ober, MD;  Location: [place];  Service: General;  Laterality: N/A;  . Peg placement N/A 07/15/2013    Procedure: PERCUTANEOUS ENDOSCOPIC GASTROSTOMY (PEG) PLACEMENT;  Surgeon: Gwenyth Ober, MD;  [facility];  Service: General;  Laterality: N/A;  . Peripherally inserted central catheter insertion     HPI:  80 y.o. male, with history of sinus infections, who initially presented to the ER around Christmas time in 2014 with dysarthria and dysphagia after a few weeks of left-sided headache. Head CT was negative except for fullness of the nasopharynx. ESR was elevated. He was referred to ENT office of Dr. Redmond Baseman for tongue swelling and was found to have paralysis of the left side of the tongue and soft palate. MR imaging was performed indicating an erosive mass of the left nasopharynx/skull base eroding the clivus and petrous apex.  He was taken back to the OR for image guided biopsy and the nasopharynx was debrided of white gooey but fibrous tissue surrounded by inflammatory changes.  At this point, he was being treated for what appeared to represent skull base osteomyelitis with inflammatory pseudotumor. Early in his hospital stay in January, a Panda tube was placed through the nose to assist in providing nutrition. Speech pathology worked with him and found he was safe to take a dysphagia 1 diet with thin liquids. MBS 07/01/12 recommended Dys 1 diet and thin liquids, left head turn and multiple swallows. Esophagram 03/14/13 revealed Small hiatal hernia with moderate gastroesophageal reflux. Prominent cricopharyngeus muscle.  He tried to maintain  calorie intake without the Panda but was unable so an elective PEG was placed by general surgery. He resumed tube feeds via PEG tube. He has been still doing IV cefepime at home given by his wife, however he continues to have generalized headaches, poor appetite, tongue weakness on the left side, dysphagia, was seen by Dr. Johnnye Sima infectious disease physician in the office where a CT scan of his head was evaluated suggestive of skull base osteomyelitis and he was referred to hospital admission for further treatment.   Assessment / Plan / Recommendation Clinical Impression  ST is familiar with pt. from prior admission where MBS recommendated Dys 1 texture and thin liquids with left head turn and multiple swallows.  He performs compensatory strategies requiring moderate verbal reminders.  Evidence of possible intrusion of po's into laryngeal vestibule, however this appears unchanged from treatment sessions during recent admission.  SLP reviewed strategies with pt./family and wife pureeing food at home without questions.  She did report increased coughing with liquids at times versus puree.  SLP recommends continuing with Dys 1 diet texture and thin liquids, swallow precautions (multiple swallows, left head turn, no straws).  No f/u ST needed at this time.     Aspiration Risk  Moderate    Diet Recommendation Dysphagia 1 (Puree);Thin liquid   Liquid Administration via: Cup;No straw Medication Administration: Whole meds with puree Supervision: Intermittent supervision to cue for compensatory strategies;Patient able to self feed Compensations: Slow rate;Small sips/bites;Multiple dry swallows after each bite/sip Postural Changes and/or Swallow Maneuvers: Out of bed for meals;Seated upright 90 degrees;Head turn left during swallow    Other  Recommendations Oral Care Recommendations: Oral care BID   Follow Up Recommendations       Frequency and Duration        Pertinent Vitals/Pain WDL          Swallow Study         Oral/Motor/Sensory Function Overall Oral Motor/Sensory Function: Impaired Labial ROM: Reduced left Labial Strength: Reduced Lingual ROM: Reduced left Lingual Symmetry: Abnormal symmetry left Lingual Strength: Reduced Facial Symmetry: Within Functional Limits Facial Strength: Reduced Velum: Impaired right;Impaired left Mandible: Within Functional Limits   Ice Chips Ice chips: Not tested   Thin Liquid Thin Liquid: Impaired Presentation: Spoon Pharyngeal  Phase Impairments: Multiple swallows;Throat Clearing - Immediate    Nectar Thick Nectar Thick Liquid: Not tested   Honey Thick Honey Thick Liquid: Not tested   Puree Puree: Impaired Presentation: Spoon;Self Fed Pharyngeal Phase Impairments: Multiple swallows   Solid   GO    Solid: Not tested       Houston Siren M.Ed [employer] [phone]  08/08/2013

## 2013-08-08 NOTE — Progress Notes (Signed)
Patient Demographics  Jason [name], is a 80 y.o. male, DOB - 05-20-1930, ZOX:[id number]  Admit date - 08/07/2013   Admitting Physician Ripudeep Jenna Luo, MD  Outpatient Primary MD for the patient is ARONSON,RICHARD A, MD  LOS - 1   No chief complaint on file.       Assessment & Plan    1. Skull base osteomyelitis - likely okay however recent CT scan does show worsening of his osteomyelitis despite 6 weeks of IV Rocephin. Admitted directly from ID office of Dr. Ninetta Lights, started on vancomycin and meropenem, ID ENT consulted. Have also informed neurosurgery. At this point family and patient do not want any surgical interventions they want medical treatment only. We'll wait for ID physician Dr. Orvan Falconer to guide [place] with a future plan of care. He has a PICC line.     2. Left-sided tongue weakness, dysphagia. Secondary to base of the skull osteomyelitis. Supportive care as in #1, will have speech see him, had extensive speech evaluation last admission and was placed on dysphagia 1 diet which will be started, speech following here as well.      3. Moderate to severe protein calorie malnutrition due to #1 and 2 above - dysphagia 1 diet orally with aspiration precautions, protein supplementations through 2, nutritionist consult.     4. ? Juglar Vein edema - obtain MRV brain per ENT     Code Status: DNR  Family Communication: wife  Disposition Plan: Home   Procedures MRI brain and C-spine, MRV brain   Consults ENT,ID, N Surg   Medications  Scheduled Meds: . acidophilus  1 capsule Oral Daily  . ceFEPIme (MAXIPIME) 2 GM IVP  2 g Intravenous Q12H  . feeding supplement (JEVITY 1.2 CAL)  480 mL Per Tube TID AC  . lactose free nutrition  237 mL Oral TID PC & HS  . meloxicam  15 mg Oral  Daily  . meropenem (MERREM) IV  1 g Intravenous Q12H  . mirtazapine  15 mg Oral QHS  . triamcinolone cream   Topical BID  . vancomycin  500 mg Intravenous Q12H   Continuous Infusions: . sodium chloride 1,000 mL (08/08/13 1016)   PRN Meds:.calcium carbonate, diazepam, guaiFENesin-dextromethorphan, morphine injection, ondansetron (ZOFRAN) IV, ondansetron, traMADol  DVT Prophylaxis  Xaralto   Lab Results  Component Value Date   PLT 159 08/08/2013    Antibiotics    Anti-infectives   Start     Dose/Rate Route Frequency Ordered Stop   08/07/13 2200  ceFEPIme (MAXIPIME) 2 g in dextrose 5 % 50 mL IVPB     2 g 100 mL/hr over 30 Minutes Intravenous Every 12 hours 08/07/13 1725     08/07/13 1830  meropenem (MERREM) 1 g in sodium chloride 0.9 % 100 mL IVPB     1 g 200 mL/hr over 30 Minutes Intravenous Every 12 hours 08/07/13 1736     08/07/13 1800  vancomycin (VANCOCIN) 500 mg in sodium chloride 0.9 % 100 mL IVPB     500 mg 100 mL/hr over 60 Minutes Intravenous Every 12 hours 08/07/13 1736            Subjective:   [name] today has, No headache, No chest pain, No abdominal pain -  No Nausea, No new weakness tingling or numbness, No Cough - SOB.    Objective:   Filed Vitals:   08/07/13 1706 08/07/13 2236 08/08/13 0620  BP: 102/61 106/65 113/62  Pulse: 75 71 72  Temp: 98 F (36.7 C) 97.7 F (36.5 C) 98.7 F (37.1 C)  TempSrc: Oral Oral Oral  Resp: 18 18 18   Height: 5\' 2"  (1.575 m)    Weight: 59.013 kg (130 lb 1.6 oz)  59.33 kg (130 lb 12.8 oz)  SpO2: 98% 95% 93%    Wt Readings from Last 3 Encounters:  08/08/13 59.33 kg (130 lb 12.8 oz)  08/07/13 60.328 kg (133 lb)  07/25/13 56.7 kg (125 lb)     Intake/Output Summary (Last 24 hours) at 08/08/13 1156 Last data filed at 08/08/13 1034  Gross per 24 hour  Intake    685 ml  Output   1125 ml  Net   -440 ml     Physical Exam  Awake Alert, Oriented X 3, No new F.N deficits, Normal affect [place].AT,PERRAL Supple  Neck,No JVD, No cervical lymphadenopathy appriciated.  Symmetrical Chest wall movement, Good air movement bilaterally, CTAB RRR,No Gallops,Rubs or new Murmurs, No Parasternal Heave +ve B.Sounds, Abd Soft, Non tender, No organomegaly appriciated, No rebound - guarding or rigidity. PEG in place No Cyanosis, Clubbing or edema, No new Rash or bruise      Data Review   Micro Results Recent Results (from the past 240 hour(s))  CULTURE, BLOOD (ROUTINE X 2)     Status: None   Collection Time    08/07/13  6:40 PM      Result Value Ref Range Status   Specimen Description BLOOD LEFT HAND   Final   Special Requests BOTTLES DRAWN AEROBIC AND ANAEROBIC 10CC   Final   Culture  Setup Time     Final   Value: 08/07/2013 22:06     Performed at [facility]   Culture     Final   Value:        BLOOD CULTURE RECEIVED NO GROWTH TO DATE CULTURE WILL BE HELD FOR 5 DAYS BEFORE ISSUING A FINAL NEGATIVE REPORT     Performed at [facility]   Report Status PENDING   Incomplete  CULTURE, BLOOD (ROUTINE X 2)     Status: None   Collection Time    08/07/13  6:55 PM      Result Value Ref Range Status   Specimen Description BLOOD LEFT ARM   Final   Special Requests BOTTLES DRAWN AEROBIC AND ANAEROBIC 10CC   Final   Culture  Setup Time     Final   Value: 08/07/2013 22:06     Performed at [facility]   Culture     Final   Value:        BLOOD CULTURE RECEIVED NO GROWTH TO DATE CULTURE WILL BE HELD FOR 5 DAYS BEFORE ISSUING A FINAL NEGATIVE REPORT     Performed at [facility]   Report Status PENDING   Incomplete    Radiology Reports Dg Swallowing Func-speech Pathology  07/09/2013   Lenor DerrickLori T Willis, CCC-SLP     07/09/2013  3:06 PM Objective Swallowing Evaluation: Modified Barium Swallowing Study   Patient Details  Name: Jason SchultzeBrice A Rhome MRN: [id number] Date of Birth: 1930/04/28  Today's Date: 07/09/2013 Time: 1330-1420 SLP Time Calculation (min): 50 min  Past Medical History:  Past  Medical History  Diagnosis Date  . Arthritis   .  Insomnia     takes Trazodone nightly  . PONV (postoperative nausea and vomiting)   . Hyperlipidemia     was on medication but has been off for a while  . Dizziness     was taking Meclizine but doesn't take now;found out that HR was  45  . Joint pain   . Joint swelling   . Itchy skin     scalp and uses a cream  . H/O hiatal hernia     takes Omeprazole daily  . Hemorrhoids   . Constipation   . History of colon polyps   . History of colitis     many yrs ago  . Urinary frequency   . History of kidney stones     passed on his own  . Enlarged prostate   . Anxiety     takes Diazepam daily prn  . Depression     takes Trazodone nightly  . Insomnia     takes Trazodone nightly   Past Surgical History:  Past Surgical History  Procedure Laterality Date  . Right knee arthroscopy    . Hernia repair      double  . Hemorrhoidectomy with hemorrhoid banding    . Tonsillectomy    . Bilateral cataract surgery    . Circumcision    . Colonoscopy    . Esophagogastroduodenoscopy    . Ear cyst excision N/A 04/05/2013    Procedure: CYST REMOVAL;  Surgeon: Melvenia Beam, MD;   Location: [facility] OR;  Service: ENT;  Laterality: N/A;  . Septoplasty N/A 04/05/2013    Procedure: SEPTOPLASTY;  Surgeon: Melvenia Beam, MD;  Location:  [facility] OR;  Service: ENT;  Laterality: N/A;  . Direct laryngoscopy N/A 06/26/2013    Procedure: DIRECT LARYNGOSCOPY;  Surgeon: Christia Reading, MD;   Location: [facility] OR;  Service: ENT;  Laterality: N/A;  . Sinus endo w/fusion N/A 07/03/2013    Procedure: ENDOSCOPIC SINUS SURGERY WITH FUSION NAVIGATION with  By-Nasopharongeal Biopsy;  Surgeon: Christia Reading, MD;  Location:  [facility] OR;  Service: ENT;  Laterality: N/A;   HPI:  Chief Complaint: left nasopharynx mass, left tongue and palate  weakness     Assessment / Plan / Recommendation Clinical Impression  Dysphagia Diagnosis: Mild oral phase dysphagia;Moderate  pharyngeal phase dysphagia Clinical impression: Patient presents with a mild oral and   moderate sensori-motor pharyngeal dysphagia, characterized by  lingual weakness and decresed ability to manipulate solid  boluses, delayed swallow initiation, decreased base of tongue  contraction to the pharyngeal wall, decreased velopharyngeal  closure, decreased anterior excursion and elevation of the hyoid  and decreased laryngeal elevation.  These deficits result in  severe pooling/residue with all consistencies in the valleculae  and pyriform sinuses.  However, when turning head to left and  swallowing twice with each bite/sip, there was minimal laryngeal  residue.  Pt. did not aspirate during this study, but is at risk  for aspiration if he is unable to prevent or clear laryngeal  residue.  Pt., wife, and grand-daughter were educated  extensively, and case was discussed with Dr. Jenne Pane, RN, and  nutritionist.  MD ordered d/c of Panda tube with hopes of  adequate po intake so that PEG might be avoided.      Treatment Recommendation  Therapy as outlined in treatment plan below    Diet Recommendation Dysphagia 1 (Puree);Thin liquid   Liquid Administration via: Cup;No straw Medication Administration: Whole meds with puree Supervision: Staff to assist with self feeding;Full  supervision/cueing  for compensatory strategies Compensations: Slow rate;Small sips/bites;Multiple dry swallows  after each bite/sip Postural Changes and/or Swallow Maneuvers: Out of bed for  meals;Seated upright 90 degrees;Head turn left during swallow    Other  Recommendations Oral Care Recommendations: Oral care  BID;Staff/trained caregiver to provide oral care Other Recommendations: Have oral suction available;Clarify  dietary restrictions   Follow Up Recommendations       Frequency and Duration min 2x/week  2 weeks   Pertinent Vitals/Pain afebrile    SLP Swallow Goals  see care plan   General HPI: Chief Complaint: left nasopharynx mass, left tongue  and palate weakness Type of Study: Modified Barium Swallowing Study Reason for Referral:  Objectively evaluate swallowing function Previous Swallow Assessment: BSE this am Diet Prior to this Study: Thin liquids Temperature Spikes Noted: No Respiratory Status: Room air History of Recent Intubation: Yes Length of Intubations (days): 1 days Date extubated: 06/26/13 Behavior/Cognition: Alert;Cooperative Oral Cavity - Dentition: Adequate natural dentition Oral Motor / Sensory Function: Impaired - see Bedside swallow  eval Self-Feeding Abilities: Able to feed self Patient Positioning: Upright in chair Baseline Vocal Quality: Clear;Low vocal intensity Volitional Cough: Weak Volitional Swallow: Able to elicit Pharyngeal Secretions: Not observed secondary MBS    Reason for Referral Objectively evaluate swallowing function   Oral Phase Oral Preparation/Oral Phase Oral Phase: Impaired Oral - Solids Oral - Regular: Impaired mastication;Weak lingual  manipulation;Incomplete tongue to palate contact;Delayed oral  transit   Pharyngeal Phase Pharyngeal Phase Pharyngeal Phase: Impaired Pharyngeal - Nectar Pharyngeal - Nectar Cup: Premature spillage to valleculae;Reduced  pharyngeal peristalsis;Reduced epiglottic inversion;Reduced  anterior laryngeal mobility;Reduced laryngeal elevation;Reduced  tongue base retraction;Pharyngeal residue - valleculae;Pharyngeal  residue - pyriform sinuses;Pharyngeal residue - posterior  pharnyx;Compensatory strategies attempted (Comment);Delayed  swallow initiation (chin tuck not effective; Better with head  turn to left!) Pharyngeal - Thin Pharyngeal - Thin Cup: Premature spillage to valleculae;Reduced  pharyngeal peristalsis;Reduced epiglottic inversion;Reduced  anterior laryngeal mobility;Reduced laryngeal elevation;Reduced  tongue base retraction;Pharyngeal residue - valleculae;Pharyngeal  residue - pyriform sinuses;Pharyngeal residue - posterior  pharnyx;Compensatory strategies attempted (Comment);Delayed  swallow initiation Pharyngeal - Solids Pharyngeal - Puree: Premature spillage  to valleculae;Reduced  pharyngeal peristalsis;Reduced epiglottic inversion;Reduced  anterior laryngeal mobility;Reduced laryngeal elevation;Reduced  tongue base retraction;Pharyngeal residue - valleculae;Pharyngeal  residue - pyriform sinuses;Pharyngeal residue - posterior  pharnyx;Compensatory strategies attempted (Comment);Delayed  swallow initiation  Cervical Esophageal Phase    GO    Cervical Esophageal Phase Cervical Esophageal Phase: Vicente Masson T 07/09/2013, 3:06 PM    Ct Temporal Bones W/cm  08/02/2013   CLINICAL DATA:  Left hypoglossal neuropathy. Skull base osteomyelitis. Prior surgery  EXAM: CT TEMPORAL BONES WITH CONTRAST  TECHNIQUE: Axial and coronal plane CT imaging of the petrous temporal bones was performed with thin-collimation image reconstruction after intravenous contrast administration. Multiplanar CT image reconstructions were also generated.  CONTRAST:  75mL OMNIPAQUE IOHEXOL 300 MG/ML  SOLN  COMPARISON:  CT sinus 07/02/2013  FINDINGS: Destructive process in the left skullbase, consistent with osteomyelitis which has been proven by tissue sampling in the operating room. There is erosion of the hypoglossal canal on the left consistent with a history of hypoglossal neuropathy. This is consistent with infection of the canal. There is also erosion of the left side of the clivus and left sphenoid bone. There is erosion of the wall of the carotid canal consistent with osteomyelitis. There is increased edema within the carotid canal and the left internal carotid artery could be  infected. The left internal carotid artery appears patent. There are gas bubbles below the carotid canal which may be related to recent biopsy or could be related to the infection.  There is edema surrounding the left jugular vein which may be narrowed or occluded. There is filling defect in the left transverse sinus compatible with thrombus. Right transverse sinus is normal. The cavernous sinus is symmetric  without evidence of venous thrombosis.  The left mastoid sinus is clear aside from some minimal mucosal edema in the mastoid tip. This has improved since the prior CT. Left middle ear is clear. The right mastoid sinus and middle ear are clear. Visualized paranasal sinuses reveal mild mucosal edema in the maxillary sinuses.  No intracranial abscess is identified on these limited images of the brain. If there is concern of cerebritis, MRI with contrast is suggested.  IMPRESSION: Destructive mass of the left skullbase consistent with osteomyelitis. There is erosion of the hypoglossal canal on the left, consistent with osteomyelitis and hypoglossal neuropathy. There is destruction of bone around the carotid canal on the left raising the possibility of carotid infection. There appears to be flow in the left internal carotid artery.  There is a small amount of thrombus in the left transverse sinus. Left jugular vein appears patent however there is considerable edema around the jugular bulb which may or may not be patent.  Bony destruction is similar to the prior CT.   Electronically Signed   By: Marlan Palau M.D.   On: 08/02/2013 16:23    CBC  Recent Labs Lab 08/07/13 1650 08/08/13 0640  WBC 11.1* 9.8  HGB 10.6* 10.1*  HCT 31.4* 30.5*  PLT 161 159  MCV 86.5 86.6  MCH 29.2 28.7  MCHC 33.8 33.1  RDW 16.6* 16.5*    Chemistries   Recent Labs Lab 08/07/13 1650 08/08/13 0640  NA 139 140  K 4.5 4.3  CL 101 105  CO2 25 22  GLUCOSE 106* 94  BUN 28* 23  CREATININE 0.78 0.72  CALCIUM 8.6 8.3*   ------------------------------------------------------------------------------------------------------------------ estimated creatinine clearance is 55.9 ml/min (by C-G formula based on Cr of 0.72). ------------------------------------------------------------------------------------------------------------------ No results found for this basename: HGBA1C,  in the last 72  hours ------------------------------------------------------------------------------------------------------------------ No results found for this basename: CHOL, HDL, LDLCALC, TRIG, CHOLHDL, LDLDIRECT,  in the last 72 hours ------------------------------------------------------------------------------------------------------------------ No results found for this basename: TSH, T4TOTAL, FREET3, T3FREE, THYROIDAB,  in the last 72 hours ------------------------------------------------------------------------------------------------------------------ No results found for this basename: VITAMINB12, FOLATE, FERRITIN, TIBC, IRON, RETICCTPCT,  in the last 72 hours  Coagulation profile No results found for this basename: INR, PROTIME,  in the last 168 hours  No results found for this basename: DDIMER,  in the last 72 hours  Cardiac Enzymes No results found for this basename: CK, CKMB, TROPONINI, MYOGLOBIN,  in the last 168 hours ------------------------------------------------------------------------------------------------------------------ No components found with this basename: POCBNP,      Time Spent in minutes   35   Susa Raring K M.D on 08/08/2013 at 11:56 AM  Between 7am to 7pm - Pager - [phone]  After 7pm go to [email or web address] - password [email or web address]  And look for the night coverage person covering for me after hours  [facility] Office  [phone]

## 2013-08-08 NOTE — Progress Notes (Signed)
Patient ID: Jason [name], male   DOB: [birth year], 80 y.o.   MRN: [id number]         [facility]    Date of Admission:  08/07/2013   Total days of antibiotics 35         Principal Problem:   Osteomyelitis of skull Active Problems:   Acute sinusitis   GERD (gastroesophageal reflux disease)   Protein-calorie malnutrition, severe   Normocytic anemia   . acidophilus  1 capsule Oral Daily  . feeding supplement (JEVITY 1.2 CAL)  480 mL Per Tube TID AC  . lactose free nutrition  237 mL Oral TID PC & HS  . meloxicam  15 mg Oral Daily  . meropenem (MERREM) IV  1 g Intravenous Q12H  . mirtazapine  15 mg Oral QHS  . rivaroxaban  10 mg Oral Q supper  . triamcinolone cream   Topical BID  . vancomycin  500 mg Intravenous Q12H    Subjective: Jason [name] underwent left nasal polypectomy in October of last year. Shortly thereafter Jason [name] began to develop some severe left-sided headache leading to her admission to the hospital last month. CT scan showed some erosion of the left clivus and features of apex. Endoscopy revealed diffuse soft tissue inflammation and a biopsy showed nonspecific acute sinusitis. Gram stain of tissue showed gram-positive cocci in pairs and the culture grew Serratia. Jason [name] had been on antibiotics for several days before cultures were obtained. Jason [name] was seen by my partner, Dr. Drue Second, who recommended IV cefepime. Jason [name] has now completed 35 days of antibiotic therapy. Jason [name] has not had any problems tolerating his PICC or cefepime. Jason [name] had lost a great deal of weight but seems to have gained one or 2 pounds since discharge. Jason [name] has been getting supplemental PEG tube feedings. Jason [name] recently completed a prednisone taper. Jason [name] underwent repeat CT scan one week ago. I have reviewed those findings with Dr. Sebastian Ache. The bony destruction at the skull base appears unchanged over the past month. There is still concern for thrombosis of the left transverse sinus and jugular vein.  Jason [name] has persistent pain on the left side of his head and neck but Jason [name] says can be as much as 8/10 although Jason [name] has not been asking for pain medication.. Jason [name] has had lots of problems with sinus mucus which seems to be better over the past 24 hours. Jason [name] is quite fatigued and has a poor appetite. Jason [name] and his wife thinks that Jason [name] may be having more problem with night sweats since completing prednisone last week.  Review of Systems: Pertinent items are noted in HPI.  Past Medical History  Diagnosis Date  . Arthritis   . Insomnia     takes Trazodone nightly  . PONV (postoperative nausea and vomiting)   . Hyperlipidemia     was on medication but has been off for a while  . Dizziness     was taking Meclizine but doesn't take now;found out that HR was 45  . Joint pain   . Joint swelling   . Itchy skin     scalp and uses a cream  . H/O hiatal hernia     takes Omeprazole daily  . Hemorrhoids   . Constipation   . History of colon polyps   . History of colitis     many yrs ago  . Urinary frequency   . History of kidney stones     passed on his own  .  Enlarged prostate   . Anxiety     takes Diazepam daily prn  . Depression     takes Trazodone nightly  . Insomnia     takes Trazodone nightly  . GERD (gastroesophageal reflux disease)   . Osteomyelitis of skull 07/2013    History  Substance Use Topics  . Smoking status: Former Smoker    Quit date: 07/01/1993  . Smokeless tobacco: Former Neurosurgeon    Types: Chew    Quit date: 12/29/2012     Comment: quit chewing tobacco several months ago and stopped smoking cigars yrs ago  . Alcohol Use: No    History reviewed. No pertinent family history.  Allergies  Allergen Reactions  . Bee Pollen Anaphylaxis  . Tetanus Toxoids Swelling    Tetanus Shot  . Hydrocodone Other (See Comments)    Confusion, "talking out of his head"    Objective: Temp:  [97.7 F (36.5 C)-98.7 F (37.1 C)] 98.5 F (36.9 C) (02/12 1510) Pulse Rate:  [69-72] 69 (02/12  1510) Resp:  [18] 18 (02/12 1510) BP: (106-113)/(62-71) 109/71 mmHg (02/12 1510) SpO2:  [93 %-95 %] 95 % (02/12 1510) Weight:  [59.33 kg (130 lb 12.8 oz)] 59.33 kg (130 lb 12.8 oz) (02/12 0620)  General: Jason [name] is weak. At times Jason [name] appears slightly confused and his wife interjects to correct him. Jason [name] also seems frustrated and injected Skin: Right arm PICC site appears normal Lungs: Clear Cor: Regular S1 and S2 no murmur Abdomen: Nontender  Lab Results Lab Results  Component Value Date   WBC 9.8 08/08/2013   HGB 10.1* 08/08/2013   HCT 30.5* 08/08/2013   MCV 86.6 08/08/2013   PLT 159 08/08/2013    Lab Results  Component Value Date   CREATININE 0.72 08/08/2013   BUN 23 08/08/2013   NA 140 08/08/2013   K 4.3 08/08/2013   CL 105 08/08/2013   CO2 22 08/08/2013    Lab Results  Component Value Date   ALT 25 07/17/2013   AST 14 07/17/2013   ALKPHOS 87 07/17/2013   BILITOT 0.6 07/17/2013      Lab Results  Component Value Date   CRP 0.6* 07/09/2013   Lab Results  Component Value Date   ESRSEDRATE 37* 07/17/2013   POCTSEDRATE 5 01/31/2013   Microbiology: Recent Results (from the past 240 hour(s))  CULTURE, BLOOD (ROUTINE X 2)     Status: None   Collection Time    08/07/13  6:40 PM      Result Value Ref Range Status   Specimen Description BLOOD LEFT HAND   Final   Special Requests BOTTLES DRAWN AEROBIC AND ANAEROBIC 10CC   Final   Culture  Setup Time     Final   Value: 08/07/2013 22:06     Performed at [facility]   Culture     Final   Value:        BLOOD CULTURE RECEIVED NO GROWTH TO DATE CULTURE WILL BE HELD FOR 5 DAYS BEFORE ISSUING A FINAL NEGATIVE REPORT     Performed at [facility]   Report Status PENDING   Incomplete  CULTURE, BLOOD (ROUTINE X 2)     Status: None   Collection Time    08/07/13  6:55 PM      Result Value Ref Range Status   Specimen Description BLOOD LEFT ARM   Final   Special Requests BOTTLES DRAWN AEROBIC AND ANAEROBIC 10CC   Final   Culture   Setup  Time     Final   Value: 08/07/2013 22:06     [facility]   Culture     Final   Value:        BLOOD CULTURE RECEIVED NO GROWTH TO DATE CULTURE WILL BE HELD FOR 5 DAYS BEFORE ISSUING A FINAL NEGATIVE REPORT     [facility]formed [facility]   Report Status PENDING   Incomplete   CT TEMPORAL BONES WITH CONTRAST 08/02/2013    IMPRESSION:  Destructive mass of the left skullbase consistent with  osteomyelitis. There is erosion of the hypoglossal canal on the  left, consistent with osteomyelitis and hypoglossal neuropathy.  There is destruction of bone around the carotid canal on the left  raising the possibility of carotid infection. There appears to be  flow in the left internal carotid artery.  There is a small amount of thrombus in the left transverse sinus.  Left jugular vein appears patent however there is considerable edema  around the jugular bulb which may or may not be patent.  Bony destruction is similar to the prior CT.   By: Marlan Palauharles Clark M.D.  On: 08/02/2013 16:23    Assessment: This is a very difficult situation with sinusitis and osteomyelitis of the skull base with possible sinus thrombosis. The original Gram stain and culture report from one month ago suggested Jason [name] might have polymicrobial infection. His antibiotic therapy has been broadened to cover more organisms than could be treated with cefepime alone.  Plan: 1. Continue vancomycin and meropenem 2. Discontinue cefepime 3. Await results of repeat MRI  Cliffton AstersJohn Gabreille Dardis, MD [facility] [phone] pager   [phone] cell 08/08/2013, 6:19 PM

## 2013-08-09 LAB — VANCOMYCIN, TROUGH: Vancomycin Tr: 6.3 ug/mL — ABNORMAL LOW (ref 10.0–20.0)

## 2013-08-09 MED ORDER — ENSURE COMPLETE PO LIQD
237.0000 mL | Freq: Two times a day (BID) | ORAL | Status: DC
  Administered 2013-08-09 – 2013-08-10 (×2): 237 mL via ORAL

## 2013-08-09 MED ORDER — VANCOMYCIN HCL IN DEXTROSE 750-5 MG/150ML-% IV SOLN
750.0000 mg | Freq: Two times a day (BID) | INTRAVENOUS | Status: DC
  Administered 2013-08-10: 750 mg via INTRAVENOUS
  Filled 2013-08-09 (×2): qty 150

## 2013-08-09 MED ORDER — POLYETHYLENE GLYCOL 3350 17 G PO PACK
17.0000 g | PACK | Freq: Every day | ORAL | Status: DC | PRN
  Administered 2013-08-10: 17 g via ORAL
  Filled 2013-08-09: qty 1

## 2013-08-09 MED ORDER — SODIUM CHLORIDE 0.9 % IV SOLN: 1.0000 g | Freq: Two times a day (BID) | INTRAVENOUS | Status: DC

## 2013-08-09 MED ORDER — SALINE SPRAY 0.65 % NA SOLN
2.0000 | Freq: Three times a day (TID) | NASAL | Status: DC | PRN
  Filled 2013-08-09: qty 44

## 2013-08-09 MED ORDER — VANCOMYCIN HCL 500 MG IV SOLR: 500.0000 mg | Freq: Two times a day (BID) | INTRAVENOUS | Status: DC

## 2013-08-09 NOTE — Progress Notes (Addendum)
NUTRITION FOLLOW-UP  DOCUMENTATION CODES Per approved criteria  -Severe malnutrition in the context of chronic illness   INTERVENTION: Continue to bolus 2 cans of Jevity 1.2 via PEG TID to provide 1710 kcal, 79 grams of protein, and 1146 ml of H2O.  Diet texture and liquid consistency per SLP. Change Boost to Ensure per pt, decrease to BID. RD to continue to monitor.  NUTRITION DIAGNOSIS: Inadequate oral intake related to dysphagia, lethargy as evidenced by wife's report. Ongoing.  Goal: Intake to meet at least 90% of estimated needs. Met.  Monitor:  Weights, labs, intake, TF initiation, PO intake  ASSESSMENT: PMHx significant for sinus infections. Admitted with generalized HA, poor appetite, tongue weakness, dysphagia. Also s/p head CT suggestive of skull base osteomyelitis. Work-up ongoing.  Currently ordered for Boost TID between meals. Pt refusing because he does not like chocolate flavor; will change to Ensure Complete to provide flavors that pt prefers.  Continues to receive 2 cans of Jevity 1.2 TID with meals via PEG. RN reports that pt is tolerating well.  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  Pt was diagnosed with malnutrition during previous hospitalization, pt meets criteria for severe MALNUTRITION in the context of chronic illness as evidenced by <75% estimated energy intake with 8% weight loss in the past 3 months. Of note, pt has gained approximately 5 lb since previous admission.  Height: Ht Readings from Last 1 Encounters:  08/07/13 5' 2"  (1.575 m)    Weight: Wt Readings from Last 1 Encounters:  08/09/13 136 lb 3.9 oz (61.8 kg)   BMI:  Body mass index is 24.91 kg/(m^2). Normal weight  Estimated Nutritional Needs: Kcal: 1750-2050 Protein: 90-110g Fluid: 1.7-2L/day  Skin: nose incision  Diet Order: Dysphagia 1; thin liquids  EDUCATION NEEDS: -No education needs identified at this time   Intake/Output Summary (Last 24  hours) at 08/09/13 1314 Last data filed at 08/09/13 0900  Gross per 24 hour  Intake   1070 ml  Output    160 ml  Net    910 ml    Last BM: 2/11  Labs:   Recent Labs Lab 08/07/13 1650 08/08/13 0640  NA 139 140  K 4.5 4.3  CL 101 105  CO2 25 22  BUN 28* 23  CREATININE 0.78 0.72  CALCIUM 8.6 8.3*  GLUCOSE 106* 94    CBG (last 3)  No results found for this basename: GLUCAP,  in the last 72 hours  Scheduled Meds: . acidophilus  1 capsule Oral Daily  . feeding supplement (ENSURE COMPLETE)  237 mL Oral BID BM  . feeding supplement (JEVITY 1.2 CAL)  480 mL Per Tube TID AC  . meloxicam  15 mg Oral Daily  . meropenem (MERREM) IV  1 g Intravenous Q12H  . mirtazapine  15 mg Oral QHS  . rivaroxaban  10 mg Oral Q supper  . triamcinolone cream   Topical BID  . vancomycin  500 mg Intravenous Q12H    Continuous Infusions:     Inda Coke MS, RD, LDN Inpatient Registered Dietitian Pager: [phone] After-hours pager: [phone]

## 2013-08-09 NOTE — Consult Note (Signed)
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 Wendie Agreste is an 80 y.o. male.  HPI: Patient is a66 year old white male whom I have seen in consultation for difficulties with neck pain. His found to have a left 12th and 11th nerve weakness and was found to have a mass at the base of the skull consistent with a tumor. 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 He's been treated for Serratia. He has persistence of a mass. 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 An MRV has been performed which demonstrates that this thrombosis has occluded the sinus completely down to the level of the jugular vein.  Past Medical History  Diagnosis Date  . Arthritis   . Insomnia     takes Trazodone nightly  . PONV (postoperative nausea and vomiting)   . Hyperlipidemia     was on medication but has been off for a while  . Dizziness     was taking Meclizine but doesn't take now;found out that HR was 45  . Joint pain   . Joint swelling   . Itchy skin     scalp and uses a cream  . H/O hiatal hernia     takes Omeprazole daily  . Hemorrhoids   . Constipation   . History of colon polyps   . History of colitis     many yrs ago  . Urinary frequency   . History of kidney stones     passed on his own  . Enlarged prostate   . Anxiety     takes Diazepam daily prn  . Depression     takes Trazodone nightly  . Insomnia     takes Trazodone nightly  . GERD (gastroesophageal reflux disease)   . 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 Right knee arthroscopy    . Hernia repair      double  . Hemorrhoidectomy with hemorrhoid banding    . Tonsillectomy    . Bilateral cataract surgery    . Circumcision    . Colonoscopy    . Esophagogastroduodenoscopy    . Ear cyst excision N/A  04/05/2013    Procedure: CYST REMOVAL;  Surgeon: Ruby Cola, MD;  Location: [place];  Service: ENT;  Laterality: N/A;  . Septoplasty N/A 04/05/2013    Procedure: SEPTOPLASTY;  Surgeon: Ruby Cola, MD;  Location: [facility] OR;  Service: ENT;  Laterality: N/A;  . Direct laryngoscopy N/A 06/26/2013    Procedure: DIRECT LARYNGOSCOPY;  Surgeon: Melida Quitter, MD;  Location: [place];  Service: ENT;  Laterality: N/A;  . Sinus endo w/fusion N/A 07/03/2013    Procedure: ENDOSCOPIC SINUS SURGERY WITH FUSION NAVIGATION with By-Nasopharongeal Biopsy;  Surgeon: Melida Quitter, MD;  Location: [place];  Service: ENT;  Laterality: N/A;  . Sinus exploration Bilateral 07/11/2013    Procedure:  BEDSIDE  SINUS EXPLORATION ;  Surgeon: Melida Quitter, MD;  Location: [place];  Service: ENT;  Laterality: Bilateral;  . Esophagogastroduodenoscopy N/A 07/15/2013    Procedure: ESOPHAGOGASTRODUODENOSCOPY (EGD);  Surgeon: Gwenyth Ober, MD;  Location: [facility] ENDOSCOPY;  Service: General;  Laterality: N/A;  . Peg placement N/A 07/15/2013    Procedure: PERCUTANEOUS ENDOSCOPIC GASTROSTOMY (PEG) PLACEMENT;  Surgeon: Gwenyth Ober, MD;  Location: [place];  Service: General;  Laterality: N/A;  .  Peripherally inserted central catheter insertion      History reviewed. No pertinent family history.  Social History:  reports that he quit smoking about 20 years ago. He quit smokeless tobacco use about 7 months ago. His smokeless tobacco use included Chew. He reports that he does not drink alcohol or use illicit drugs.  Allergies:  Allergies  Allergen Reactions  . Bee Pollen Anaphylaxis  . Tetanus Toxoids Swelling    Tetanus Shot  . Hydrocodone Other (See Comments)    Confusion, "talking out of his head"    Medications: I have reviewed the patient's current medications.  Results for orders placed during the hospital encounter of 08/07/13 (from the past 48 hour(s))  CBC     Status: Abnormal   Collection Time    08/07/13  4:50 PM      Result  Value Ref Range   WBC 11.1 (*) 4.0 - 10.5 K/uL   RBC 3.63 (*) 4.22 - 5.81 MIL/uL   Hemoglobin 10.6 (*) 13.0 - 17.0 g/dL   HCT 31.4 (*) 39.0 - 52.0 %   MCV 86.5  78.0 - 100.0 fL   MCH 29.2  26.0 - 34.0 pg   MCHC 33.8  30.0 - 36.0 g/dL   RDW 16.6 (*) 11.5 - 15.5 %   Platelets 161  150 - 400 K/uL  BASIC METABOLIC PANEL     Status: Abnormal   Collection Time    08/07/13  4:50 PM      Result Value Ref Range   Sodium 139  137 - 147 mEq/L   Potassium 4.5  3.7 - 5.3 mEq/L   Chloride 101  96 - 112 mEq/L   CO2 25  19 - 32 mEq/L   Glucose, Bld 106 (*) 70 - 99 mg/dL   BUN 28 (*) 6 - 23 mg/dL   Creatinine, Ser 0.78  0.50 - 1.35 mg/dL   Calcium 8.6  8.4 - 10.5 mg/dL   GFR calc non Af Amer 82 (*) >90 mL/min   GFR calc Af Amer >90  >90 mL/min   Comment: (NOTE)     The eGFR has been calculated using the CKD EPI equation.     This calculation has not been validated in all clinical situations.     eGFR's persistently <90 mL/min signify possible Chronic Kidney     Disease.  CULTURE, BLOOD (ROUTINE X 2)     Status: None   Collection Time    08/07/13  6:40 PM      Result Value Ref Range   Specimen Description BLOOD LEFT HAND     Special Requests BOTTLES DRAWN AEROBIC AND ANAEROBIC 10CC     Culture  Setup Time       Value: 08/07/2013 22:06     Performed at [facility]   Culture       Value:        BLOOD CULTURE RECEIVED NO GROWTH TO DATE CULTURE WILL BE HELD FOR 5 DAYS BEFORE ISSUING A FINAL NEGATIVE REPORT     Performed at [facility]   Report Status PENDING    CULTURE, BLOOD (ROUTINE X 2)     Status: None   Collection Time    08/07/13  6:55 PM      Result Value Ref Range   Specimen Description BLOOD LEFT ARM     Special Requests BOTTLES DRAWN AEROBIC AND ANAEROBIC 10CC     Culture  Setup Time       Value:  08/07/2013 22:06     Performed at [facility]   Culture       Value:        BLOOD CULTURE RECEIVED NO GROWTH TO DATE CULTURE WILL BE HELD FOR 5 DAYS BEFORE  ISSUING A FINAL NEGATIVE REPORT     Performed at [facility]   Report Status PENDING    BASIC METABOLIC PANEL     Status: Abnormal   Collection Time    08/08/13  6:40 AM      Result Value Ref Range   Sodium 140  137 - 147 mEq/L   Potassium 4.3  3.7 - 5.3 mEq/L   Chloride 105  96 - 112 mEq/L   CO2 22  19 - 32 mEq/L   Glucose, Bld 94  70 - 99 mg/dL   BUN 23  6 - 23 mg/dL   Creatinine, Ser 0.72  0.50 - 1.35 mg/dL   Calcium 8.3 (*) 8.4 - 10.5 mg/dL   GFR calc non Af Amer 85 (*) >90 mL/min   GFR calc Af Amer >90  >90 mL/min   Comment: (NOTE)     The eGFR has been calculated using the CKD EPI equation.     This calculation has not been validated in all clinical situations.     eGFR's persistently <90 mL/min signify possible Chronic Kidney     Disease.  CBC     Status: Abnormal   Collection Time    08/08/13  6:40 AM      Result Value Ref Range   WBC 9.8  4.0 - 10.5 K/uL   RBC 3.52 (*) 4.22 - 5.81 MIL/uL   Hemoglobin 10.1 (*) 13.0 - 17.0 g/dL   HCT 30.5 (*) 39.0 - 52.0 %   MCV 86.6  78.0 - 100.0 fL   MCH 28.7  26.0 - 34.0 pg   MCHC 33.1  30.0 - 36.0 g/dL   RDW 16.5 (*) 11.5 - 15.5 %   Platelets 159  150 - 400 K/uL    Mr Brain Wo Contrast  08/08/2013   CLINICAL DATA:  History of osteomyelitis at the skullbase.  EXAM: MRI HEAD WITHOUT CONTRAST  MRV HEAD WITHOUT CONTRAST  MRI CERVICAL SPINE WITHOUT CONTRAST  TECHNIQUE: Multiplanar, multiecho pulse sequences of the brain and surrounding structures, and cervical spine, to include the craniocervical junction and cervicothoracic junction, were obtained without intravenous contrast.  Angiographic images of the intracranial venous structures were obtained using MRV technique without intravenous contrast.  COMPARISON:  Prior MRI from 07/01/2013 and CT from 08/02/2013.  FINDINGS: MRI HEAD FINDINGS  The study is limited by motion artifact.  Mild age-related atrophy with chronic small vessel ischemic changes again noted, unchanged relative  to prior. No midline shift or extra-axial fluid collection. Ventricles are normal in size without evidence of hydrocephalus.  No diffusion-weighted signal abnormality is identified to suggest acute intracranial infarct. Gray-white matter differentiation is maintained. Normal flow voids are seen within the intracranial vasculature. No intracranial hemorrhage identified.  The cervicomedullary junction is normal. Pituitary gland is within normal limits. Pituitary stalk is midline. The globes and optic nerves demonstrate a normal appearance with normal signal intensity.  Again seen is a in destructive soft tissue mass involving the posterior nasopharynx extending posteriorly to invade the clivus. This lesion measures approximately 2.9 cm, not significantly changed relative to the previous examination. There is involvement of the left petrous apex with invasion of the left hypoglossal canal. There is trace fluid signal intensity  within the left mastoid air cells, improved from prior. Normal flow voids are seen within the distal cervical and petrous segments of the left internal carotid artery which are encased by this mass.  Calvarium is intact. Visualized upper cervical spine is within normal limits.  Scalp soft tissues are unremarkable.  Paranasal sinuses are clear.  No mastoid effusion.  MRV HEAD FINDINGS:  Dedicated MRV sequences demonstrate an attenuated left transverse sinus. There is question of minimal flow within the proximal left transverse sinus. Distally, the left transverse sinus, sigmoid sinus, and jugular vein are not visualized. Small amount of hyperintense precontrast T1 signal intensity seen within the proximal left sigmoid sinus is suggestive of thrombus (series 11, image 70). These findings may be secondary to compression from the posterior nasopharyngeal mass and/or venous sinus thrombosis.  The superior sagittal and straight sinuses as well as the deep venous veins are widely patent. The right  transverse sinus, sigmoid sinus, and jugular vein are widely patent.  MRI CERVICAL SPINE FINDINGS  The study is degraded by motion artifact.  The craniocervical junction is widely patent. The vertebral bodies are normally aligned with preservation of the normal cervical lordosis. No listhesis. Vertebral body heights are preserved. Signal intensity within the vertebral body bone marrow and spinal cord is normal.  At C2-3, there is diffuse degenerative disc osteophyte with right-sided facet arthrosis and uncovertebral osteophytosis. There is resultant moderate right foraminal stenosis. No central canal or significant left neural foraminal narrowing identified.  At C3-4, there is broad-based degenerative disc osteophyte with bilateral facet arthrosis and uncovertebral osteophytosis. Posterior disc osteophyte mildly flattens the ventral thecal sac without significant canal stenosis. There is moderate right with mild left foraminal stenosis.  At C4-5, there is broad-based degenerative disc osteophyte with bilateral facet arthrosis, right greater than left. There is mild right foraminal stenosis. Posterior disc osteophyte mildly flattens the ventral thecal sac resulting in mild canal stenosis.  At C5-6, there is broad-based degenerative disc osteophyte with bilateral facet arthrosis and uncovertebral osteophytosis. Posterior disc osteophyte flattens and partially effaces the ventral thecal sac and results in moderate canal stenosis. There is moderate left with mild right foraminal narrowing.  At C6-7, evaluation limited by motion artifact. There is broad-based degenerative disc osteophyte with bilateral facet arthrosis. There is at least mild bilateral foraminal narrowing, slightly worse on the left. There is mild central canal stenosis.  At C7-T1, no canal or neural foraminal narrowing identified.  The visualized soft tissues of the neck are within normal limits.  IMPRESSION: MRI HEAD:  1. Similar size of soft tissue  mass involving the left posterior nasopharynx with extension into the left petrous apex, compatible with history of skullbase osteomyelitis at this location. The soft tissue mass invades the clivus posteriorly. Trace left mastoid fluid is present, improved relative to prior exam. 2. No acute intracranial infarct or other abnormality identified. 3. Mild cerebral atrophy, unchanged.  MRV HEAD:  1. Absence of flow within the left sigmoid sinus and left jugular vein with small amount of hyperintense precontrast T1 signal intensity seen within the proximal left sigmoid sinus, most consistent with thrombus. It is unclear how much of the absence of flow within this region is related to complete thrombosis or compression from the posterior nasopharyngeal mass. The left transverse sinus is attenuated and not well visualized. 2. Otherwise normal appearance of remaining venous sinuses within the brain without evidence of venous sinus thrombosis or other abnormality.  MRI CERVICAL SPINE:  1. Broad-based degenerative disc osteophyte at  C5-6 resulting in moderate canal and left foraminal stenosis with mild right foraminal narrowing, similar to prior. 2. Degenerative disc osteophyte at C6-7 with resultant mild canal and mild bilateral foraminal stenosis, similar to prior. 3. Degenerative disc osteophyte at C3-4 with resultant moderate right with mild left foraminal narrowing, similar to prior. 4. Mild canal and mild right foraminal stenosis at C4-5, similar to prior. 5. Additional multilevel degenerative disc disease as above.   Electronically Signed   By: Jeannine Boga M.D.   On: 08/08/2013 23:03   Mr Cervical Spine Wo Contrast  08/08/2013   CLINICAL DATA:  History of osteomyelitis at the skullbase.  EXAM: MRI HEAD WITHOUT CONTRAST  MRV HEAD WITHOUT CONTRAST  MRI CERVICAL SPINE WITHOUT CONTRAST  TECHNIQUE: Multiplanar, multiecho pulse sequences of the brain and surrounding structures, and cervical spine, to include the  craniocervical junction and cervicothoracic junction, were obtained without intravenous contrast.  Angiographic images of the intracranial venous structures were obtained using MRV technique without intravenous contrast.  COMPARISON:  Prior MRI from 07/01/2013 and CT from 08/02/2013.  FINDINGS: MRI HEAD FINDINGS  The study is limited by motion artifact.  Mild age-related atrophy with chronic small vessel ischemic changes again noted, unchanged relative to prior. No midline shift or extra-axial fluid collection. Ventricles are normal in size without evidence of hydrocephalus.  No diffusion-weighted signal abnormality is identified to suggest acute intracranial infarct. Gray-white matter differentiation is maintained. Normal flow voids are seen within the intracranial vasculature. No intracranial hemorrhage identified.  The cervicomedullary junction is normal. Pituitary gland is within normal limits. Pituitary stalk is midline. The globes and optic nerves demonstrate a normal appearance with normal signal intensity.  Again seen is a in destructive soft tissue mass involving the posterior nasopharynx extending posteriorly to invade the clivus. This lesion measures approximately 2.9 cm, not significantly changed relative to the previous examination. There is involvement of the left petrous apex with invasion of the left hypoglossal canal. There is trace fluid signal intensity within the left mastoid air cells, improved from prior. Normal flow voids are seen within the distal cervical and petrous segments of the left internal carotid artery which are encased by this mass.  Calvarium is intact. Visualized upper cervical spine is within normal limits.  Scalp soft tissues are unremarkable.  Paranasal sinuses are clear.  No mastoid effusion.  MRV HEAD FINDINGS:  Dedicated MRV sequences demonstrate an attenuated left transverse sinus. There is question of minimal flow within the proximal left transverse sinus. Distally, the  left transverse sinus, sigmoid sinus, and jugular vein are not visualized. Small amount of hyperintense precontrast T1 signal intensity seen within the proximal left sigmoid sinus is suggestive of thrombus (series 11, image 70). These findings may be secondary to compression from the posterior nasopharyngeal mass and/or venous sinus thrombosis.  The superior sagittal and straight sinuses as well as the deep venous veins are widely patent. The right transverse sinus, sigmoid sinus, and jugular vein are widely patent.  MRI CERVICAL SPINE FINDINGS  The study is degraded by motion artifact.  The craniocervical junction is widely patent. The vertebral bodies are normally aligned with preservation of the normal cervical lordosis. No listhesis. Vertebral body heights are preserved. Signal intensity within the vertebral body bone marrow and spinal cord is normal.  At C2-3, there is diffuse degenerative disc osteophyte with right-sided facet arthrosis and uncovertebral osteophytosis. There is resultant moderate right foraminal stenosis. No central canal or significant left neural foraminal narrowing identified.  At C3-4, there  is broad-based degenerative disc osteophyte with bilateral facet arthrosis and uncovertebral osteophytosis. Posterior disc osteophyte mildly flattens the ventral thecal sac without significant canal stenosis. There is moderate right with mild left foraminal stenosis.  At C4-5, there is broad-based degenerative disc osteophyte with bilateral facet arthrosis, right greater than left. There is mild right foraminal stenosis. Posterior disc osteophyte mildly flattens the ventral thecal sac resulting in mild canal stenosis.  At C5-6, there is broad-based degenerative disc osteophyte with bilateral facet arthrosis and uncovertebral osteophytosis. Posterior disc osteophyte flattens and partially effaces the ventral thecal sac and results in moderate canal stenosis. There is moderate left with mild right  foraminal narrowing.  At C6-7, evaluation limited by motion artifact. There is broad-based degenerative disc osteophyte with bilateral facet arthrosis. There is at least mild bilateral foraminal narrowing, slightly worse on the left. There is mild central canal stenosis.  At C7-T1, no canal or neural foraminal narrowing identified.  The visualized soft tissues of the neck are within normal limits.  IMPRESSION: MRI HEAD:  1. 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 The soft tissue mass invades the clivus posteriorly. Trace left mastoid fluid is present, improved relative to prior exam. 2. No acute intracranial infarct or other abnormality identified. 3. Mild cerebral atrophy, unchanged.  MRV HEAD:  1. Absence of flow within the left sigmoid sinus and left jugular vein with small amount of hyperintense precontrast T1 signal intensity seen within the proximal left sigmoid sinus, most consistent with thrombus. It is unclear how much of the absence of flow within this region is related to complete thrombosis or compression from the posterior nasopharyngeal mass. The left transverse sinus is attenuated and not well visualized. 2. Otherwise normal appearance of remaining venous sinuses within the brain without evidence of venous sinus thrombosis or other abnormality.  MRI CERVICAL SPINE:  1. Broad-based degenerative disc osteophyte at C5-6 resulting in moderate canal and left foraminal stenosis with mild right foraminal narrowing, similar to prior. 2. Degenerative disc osteophyte at C6-7 with resultant mild canal and mild bilateral foraminal stenosis, similar to prior. 3. Degenerative disc osteophyte at C3-4 with resultant moderate right with mild left foraminal narrowing, similar to prior. 4. Mild canal and mild right foraminal stenosis at C4-5, similar to prior. 5. Additional  multilevel degenerative disc disease as above.   Electronically Signed   By: Jeannine Boga M.D.   On: 08/08/2013 23:03   Mr Mrv Head Wo Cm  08/08/2013   CLINICAL DATA:  History of osteomyelitis at the skullbase.  EXAM: MRI HEAD WITHOUT CONTRAST  MRV HEAD WITHOUT CONTRAST  MRI CERVICAL SPINE WITHOUT CONTRAST  TECHNIQUE: Multiplanar, multiecho pulse sequences of the brain and surrounding structures, and cervical spine, to include the craniocervical junction and cervicothoracic junction, were obtained without intravenous contrast.  Angiographic images of the intracranial venous structures were obtained using MRV technique without intravenous contrast.  COMPARISON:  Prior MRI from 07/01/2013 and CT from 08/02/2013.  FINDINGS: MRI HEAD FINDINGS  The study is limited by motion artifact.  Mild age-related atrophy with chronic small vessel ischemic changes again noted, unchanged relative to prior. No midline shift or extra-axial fluid collection. Ventricles are normal in size without evidence of hydrocephalus.  No diffusion-weighted signal abnormality is identified to suggest acute intracranial infarct. Gray-white matter differentiation is maintained. Normal flow voids are seen within the intracranial vasculature. No intracranial hemorrhage identified.  The cervicomedullary junction is normal. Pituitary gland is within normal limits. Pituitary stalk is midline. The globes and optic nerves demonstrate a normal appearance with normal signal intensity.  Again seen is a in destructive soft tissue mass involving the posterior nasopharynx extending posteriorly to invade the clivus. This lesion measures approximately 2.9 cm, not significantly changed relative to the previous examination. There is involvement of the left petrous apex with invasion of the left hypoglossal canal. There is trace fluid signal intensity within the left mastoid air cells, improved from prior. Normal flow voids are seen within the distal  cervical and petrous segments of the left internal carotid artery which are encased by this mass.  Calvarium is intact. Visualized upper cervical spine is within normal limits.  Scalp soft tissues are unremarkable.  Paranasal sinuses are clear.  No mastoid effusion.  MRV HEAD FINDINGS:  Dedicated MRV sequences demonstrate an attenuated left transverse sinus. There is question of minimal flow within the proximal left transverse sinus. Distally, the left transverse sinus, sigmoid sinus, and jugular vein are not visualized. Small amount of hyperintense precontrast T1 signal intensity seen within the proximal left sigmoid sinus is suggestive of thrombus (series 11, image 70). These findings may be secondary to compression from the posterior nasopharyngeal mass and/or venous sinus thrombosis.  The superior sagittal and straight sinuses as well as the deep venous veins are widely patent. The right transverse sinus, sigmoid sinus, and jugular vein are widely patent.  MRI CERVICAL SPINE FINDINGS  The study is degraded by motion artifact.  The craniocervical junction is widely patent. The vertebral bodies are normally aligned with preservation of the normal cervical lordosis. No listhesis. Vertebral body heights are preserved. Signal intensity within the vertebral body bone marrow and spinal cord is normal.  At C2-3, there is diffuse degenerative disc osteophyte with right-sided facet arthrosis and uncovertebral osteophytosis. There is resultant moderate right foraminal stenosis. No central canal or significant left neural foraminal narrowing identified.  At C3-4, there is broad-based degenerative disc osteophyte with bilateral facet arthrosis and uncovertebral osteophytosis. Posterior disc osteophyte mildly flattens the ventral thecal sac without significant canal stenosis. There is moderate right with mild left foraminal stenosis.  At C4-5, there is broad-based degenerative disc osteophyte with bilateral facet arthrosis,  right greater than left. There is mild right foraminal stenosis. Posterior disc osteophyte mildly flattens the ventral thecal sac resulting in mild canal stenosis.  At C5-6, there is broad-based degenerative disc osteophyte with bilateral facet arthrosis and uncovertebral osteophytosis. Posterior disc osteophyte flattens and partially effaces the ventral thecal sac and results in moderate canal stenosis. There is moderate left with mild right foraminal narrowing.  At C6-7, evaluation limited by motion artifact. There is broad-based degenerative disc osteophyte with bilateral facet arthrosis. There is at least mild bilateral foraminal narrowing, slightly worse on the left. There is mild central canal stenosis.  At C7-T1, no canal or neural foraminal narrowing identified.  The visualized soft tissues of the neck are within normal limits.  IMPRESSION: MRI HEAD:  1. 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 The soft tissue mass invades the clivus posteriorly. Trace left mastoid fluid is present, improved relative to prior exam. 2. No acute intracranial infarct or other abnormality identified. 3. Mild cerebral atrophy, unchanged.  MRV HEAD:  1. Absence of flow within the left sigmoid sinus and left jugular vein with small amount  of hyperintense precontrast T1 signal intensity seen within the proximal left sigmoid sinus, most consistent with thrombus. It is unclear how much of the absence of flow within this region is related to complete thrombosis or compression from the posterior nasopharyngeal mass. The left transverse sinus is attenuated and not well visualized. 2. Otherwise normal appearance of remaining venous sinuses within the brain without evidence of venous sinus thrombosis or other abnormality.  MRI CERVICAL SPINE:  1. Broad-based degenerative disc osteophyte at C5-6  resulting in moderate canal and left foraminal stenosis with mild right foraminal narrowing, similar to prior. 2. Degenerative disc osteophyte at C6-7 with resultant mild canal and mild bilateral foraminal stenosis, similar to prior. 3. Degenerative disc osteophyte at C3-4 with resultant moderate right with mild left foraminal narrowing, similar to prior. 4. Mild canal and mild right foraminal stenosis at C4-5, similar to prior. 5. Additional multilevel degenerative disc disease as above.   Electronically Signed   By: Jeannine Boga M.D.   On: 08/08/2013 23:03    Review of Systems  Constitutional: Positive for weight loss and malaise/fatigue.  Eyes: Negative.   Respiratory: Negative.   Cardiovascular: Negative.   Genitourinary: Negative.   Musculoskeletal: Positive for neck pain.  Skin: Negative.   Neurological: Positive for focal weakness and headaches.  Endo/Heme/Allergies: Negative.   Psychiatric/Behavioral: Positive for depression.   Blood pressure 100/55, pulse 69, temperature 98.2 F (36.8 C), temperature source Oral, resp. rate 18, height 5' 2"  (1.575 m), weight 61.8 kg (136 lb 3.9 oz), SpO2 96.00%. Physical Exam  Constitutional: He appears well-developed and well-nourished.  HENT:  Head: Normocephalic.  Eyes: Pupils are equal, round, and reactive to light.  Neck: Normal range of motion. Neck supple.  Neurological:  Left hypoglossal paralysis, left palatal weakness.    Assessment/Plan: Left transverse sinus, jugular vein occlusion with thrombus.  I do not recommend that this patient be anticoagulated as he has completed occlusion of the venous system on the left. His right-sided venous system has adequately provided venous drainage and there is no evidence of a stroke. The patient is not interested in surgical debridement of the pseudotumor Undergo further medical management. He does appear to be severely depressed.  Unnamed Hino J 08/09/2013, 3:55 PM

## 2013-08-09 NOTE — Progress Notes (Signed)
Patient ID: Jason SchultzeBrice A [name], male   DOB: Apr 09, 1930, 80 y.o.   MRN: [id number] Patient doing fine today.  No new complaints.  Still not eating much. AF VSS Alert, NAD. Left ear with tympanostomy tube.  Normal nose. Left tongue and soft palate weakness. A/P:  Skull base osteomyelitis, left glossopharyngeal and hypoglossal palsy, dysarthria, dysphagia, malnutrition, left transverse sinus thrombus I personally reviewed his labs and MRI results.  The left dural sinus/jugular system appears to be fully obstructed.  Per my previous conversation with Dr. Danielle DessElsner, he did not fee that anti-coagulation would be worthwhile in this situation.  That said, an official neurosurgery opinion after review of the imaging would be helpful.  Change of antibiotics by ID noted.  I do not see any role for surgery in his situation but again, a neurosurgeon's opinion would help.

## 2013-08-09 NOTE — Progress Notes (Signed)
Patient ID: Jason [name], male   DOB: February 13, 1930, 80 y.o.   MRN: [id number]         [facility]    Date of Admission:  08/07/2013   Total days of antibiotics 36        Day 2 vancomycin        Day 2 meropenem         Principal Problem:   Osteomyelitis of skull Active Problems:   Acute sinusitis   GERD (gastroesophageal reflux disease)   Protein-calorie malnutrition, severe   Normocytic anemia   . acidophilus  1 capsule Oral Daily  . feeding supplement (JEVITY 1.2 CAL)  480 mL Per Tube TID AC  . lactose free nutrition  237 mL Oral TID PC & HS  . meloxicam  15 mg Oral Daily  . meropenem (MERREM) IV  1 g Intravenous Q12H  . mirtazapine  15 mg Oral QHS  . rivaroxaban  10 mg Oral Q supper  . triamcinolone cream   Topical BID  . vancomycin  500 mg Intravenous Q12H    Subjective: He has no new problems.   Past Medical History  Diagnosis Date  . Arthritis   . Insomnia     takes Trazodone nightly  . PONV (postoperative nausea and vomiting)   . Hyperlipidemia     was on medication but has been off for a while  . Dizziness     was taking Meclizine but doesn't take now;found out that HR was 45  . Joint pain   . Joint swelling   . Itchy skin     scalp and uses a cream  . H/O hiatal hernia     takes Omeprazole daily  . Hemorrhoids   . Constipation   . History of colon polyps   . History of colitis     many yrs ago  . Urinary frequency   . History of kidney stones     passed on his own  . Enlarged prostate   . Anxiety     takes Diazepam daily prn  . Depression     takes Trazodone nightly  . Insomnia     takes Trazodone nightly  . GERD (gastroesophageal reflux disease)   . Osteomyelitis of skull 07/2013    History  Substance Use Topics  . Smoking status: Former Smoker    Quit date: 07/01/1993  . Smokeless tobacco: Former NeurosurgeonUser    Types: Chew    Quit date: 12/29/2012     Comment: quit chewing tobacco several months ago and stopped  smoking cigars yrs ago  . Alcohol Use: No    History reviewed. No pertinent family history.  Allergies  Allergen Reactions  . Bee Pollen Anaphylaxis  . Tetanus Toxoids Swelling    Tetanus Shot  . Hydrocodone Other (See Comments)    Confusion, "talking out of his head"    Objective: Temp:  [98.1 F (36.7 C)-98.8 F (37.1 C)] 98.8 F (37.1 C) (02/13 0455) Pulse Rate:  [64-74] 74 (02/13 0455) Resp:  [18-20] 20 (02/13 0455) BP: (106-117)/(61-71) 117/63 mmHg (02/13 0455) SpO2:  [92 %-95 %] 92 % (02/13 0455) Weight:  [61.8 kg (136 lb 3.9 oz)] 61.8 kg (136 lb 3.9 oz) (02/13 0437)  General: No change in exam overnight he is alert, frustrated but in no distress  Lab Results Lab Results  Component Value Date   WBC 9.8 08/08/2013   HGB 10.1* 08/08/2013   HCT 30.5* 08/08/2013  MCV 86.6 08/08/2013   PLT 159 08/08/2013    Lab Results  Component Value Date   CREATININE 0.72 08/08/2013   BUN 23 08/08/2013   NA 140 08/08/2013   K 4.3 08/08/2013   CL 105 08/08/2013   CO2 22 08/08/2013    Lab Results  Component Value Date   ALT 25 07/17/2013   AST 14 07/17/2013   ALKPHOS 87 07/17/2013   BILITOT 0.6 07/17/2013      Microbiology: Recent Results (from the past 240 hour(s))  CULTURE, BLOOD (ROUTINE X 2)     Status: None   Collection Time    08/07/13  6:40 PM      Result Value Ref Range Status   Specimen Description BLOOD LEFT HAND   Final   Special Requests BOTTLES DRAWN AEROBIC AND ANAEROBIC 10CC   Final   Culture  Setup Time     Final   Value: 08/07/2013 22:06     Performed at [facility]   Culture     Final   Value:        BLOOD CULTURE RECEIVED NO GROWTH TO DATE CULTURE WILL BE HELD FOR 5 DAYS BEFORE ISSUING A FINAL NEGATIVE REPORT     Performed at [facility]   Report Status PENDING   Incomplete  CULTURE, BLOOD (ROUTINE X 2)     Status: None   Collection Time    08/07/13  6:55 PM      Result Value Ref Range Status   Specimen Description BLOOD LEFT ARM    Final   Special Requests BOTTLES DRAWN AEROBIC AND ANAEROBIC 10CC   Final   Culture  Setup Time     Final   Value: 08/07/2013 22:06     Performed at [facility]   Culture     Final   Value:        BLOOD CULTURE RECEIVED NO GROWTH TO DATE CULTURE WILL BE HELD FOR 5 DAYS BEFORE ISSUING A FINAL NEGATIVE REPORT     Performed at [facility]   Report Status PENDING   Incomplete    Studies/Results: Mr Brain Wo Contrast  08/08/2013   CLINICAL DATA:  History of osteomyelitis at the skullbase.  EXAM: MRI HEAD WITHOUT CONTRAST  MRV HEAD WITHOUT CONTRAST  MRI CERVICAL SPINE WITHOUT CONTRAST  TECHNIQUE: Multiplanar, multiecho pulse sequences of the brain and surrounding structures, and cervical spine, to include the craniocervical junction and cervicothoracic junction, were obtained without intravenous contrast.  Angiographic images of the intracranial venous structures were obtained using MRV technique without intravenous contrast.  COMPARISON:  Prior MRI from 07/01/2013 and CT from 08/02/2013.  FINDINGS: MRI HEAD FINDINGS  The study is limited by motion artifact.  Mild age-related atrophy with chronic small vessel ischemic changes again noted, unchanged relative to prior. No midline shift or extra-axial fluid collection. Ventricles are normal in size without evidence of hydrocephalus.  No diffusion-weighted signal abnormality is identified to suggest acute intracranial infarct. Gray-white matter differentiation is maintained. Normal flow voids are seen within the intracranial vasculature. No intracranial hemorrhage identified.  The cervicomedullary junction is normal. Pituitary gland is within normal limits. Pituitary stalk is midline. The globes and optic nerves demonstrate a normal appearance with normal signal intensity.  Again seen is a in destructive soft tissue mass involving the posterior nasopharynx extending posteriorly to invade the clivus. This lesion measures approximately 2.9 cm, not  significantly changed relative to the previous examination. There is involvement of the left  petrous apex with invasion of the left hypoglossal canal. There is trace fluid signal intensity within the left mastoid air cells, improved from prior. Normal flow voids are seen within the distal cervical and petrous segments of the left internal carotid artery which are encased by this mass.  Calvarium is intact. Visualized upper cervical spine is within normal limits.  Scalp soft tissues are unremarkable.  Paranasal sinuses are clear.  No mastoid effusion.  MRV HEAD FINDINGS:  Dedicated MRV sequences demonstrate an attenuated left transverse sinus. There is question of minimal flow within the proximal left transverse sinus. Distally, the left transverse sinus, sigmoid sinus, and jugular vein are not visualized. Small amount of hyperintense precontrast T1 signal intensity seen within the proximal left sigmoid sinus is suggestive of thrombus (series 11, image 70). These findings may be secondary to compression from the posterior nasopharyngeal mass and/or venous sinus thrombosis.  The superior sagittal and straight sinuses as well as the deep venous veins are widely patent. The right transverse sinus, sigmoid sinus, and jugular vein are widely patent.  MRI CERVICAL SPINE FINDINGS  The study is degraded by motion artifact.  The craniocervical junction is widely patent. The vertebral bodies are normally aligned with preservation of the normal cervical lordosis. No listhesis. Vertebral body heights are preserved. Signal intensity within the vertebral body bone marrow and spinal cord is normal.  At C2-3, there is diffuse degenerative disc osteophyte with right-sided facet arthrosis and uncovertebral osteophytosis. There is resultant moderate right foraminal stenosis. No central canal or significant left neural foraminal narrowing identified.  At C3-4, there is broad-based degenerative disc osteophyte with bilateral facet  arthrosis and uncovertebral osteophytosis. Posterior disc osteophyte mildly flattens the ventral thecal sac without significant canal stenosis. There is moderate right with mild left foraminal stenosis.  At C4-5, there is broad-based degenerative disc osteophyte with bilateral facet arthrosis, right greater than left. There is mild right foraminal stenosis. Posterior disc osteophyte mildly flattens the ventral thecal sac resulting in mild canal stenosis.  At C5-6, there is broad-based degenerative disc osteophyte with bilateral facet arthrosis and uncovertebral osteophytosis. Posterior disc osteophyte flattens and partially effaces the ventral thecal sac and results in moderate canal stenosis. There is moderate left with mild right foraminal narrowing.  At C6-7, evaluation limited by motion artifact. There is broad-based degenerative disc osteophyte with bilateral facet arthrosis. There is at least mild bilateral foraminal narrowing, slightly worse on the left. There is mild central canal stenosis.  At C7-T1, no canal or neural foraminal narrowing identified.  The visualized soft tissues of the neck are within normal limits.  IMPRESSION: MRI HEAD:  1. 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 The soft tissue mass invades the clivus posteriorly. Trace left mastoid fluid is present, improved relative to prior exam. 2. No acute intracranial infarct or other abnormality identified. 3. Mild cerebral atrophy, unchanged.  MRV HEAD:  1. Absence of flow within the left sigmoid sinus and left jugular vein with small amount of hyperintense precontrast T1 signal intensity seen within the proximal left sigmoid sinus, most consistent with thrombus. It is unclear how much of the absence of flow within this region is related to complete thrombosis or compression from the posterior  nasopharyngeal mass. The left transverse sinus is attenuated and not well visualized. 2. Otherwise normal appearance of remaining venous sinuses within the brain without evidence of venous sinus  thrombosis or other abnormality.  MRI CERVICAL SPINE:  1. Broad-based degenerative disc osteophyte at C5-6 resulting in moderate canal and left foraminal stenosis with mild right foraminal narrowing, similar to prior. 2. Degenerative disc osteophyte at C6-7 with resultant mild canal and mild bilateral foraminal stenosis, similar to prior. 3. Degenerative disc osteophyte at C3-4 with resultant moderate right with mild left foraminal narrowing, similar to prior. 4. Mild canal and mild right foraminal stenosis at C4-5, similar to prior. 5. Additional multilevel degenerative disc disease as above.   Electronically Signed   By: Rise Mu M.D.   On: 08/08/2013 23:03   Mr Cervical Spine Wo Contrast  08/08/2013   CLINICAL DATA:  History of osteomyelitis at the skullbase.  EXAM: MRI HEAD WITHOUT CONTRAST  MRV HEAD WITHOUT CONTRAST  MRI CERVICAL SPINE WITHOUT CONTRAST  TECHNIQUE: Multiplanar, multiecho pulse sequences of the brain and surrounding structures, and cervical spine, to include the craniocervical junction and cervicothoracic junction, were obtained without intravenous contrast.  Angiographic images of the intracranial venous structures were obtained using MRV technique without intravenous contrast.  COMPARISON:  Prior MRI from 07/01/2013 and CT from 08/02/2013.  FINDINGS: MRI HEAD FINDINGS  The study is limited by motion artifact.  Mild age-related atrophy with chronic small vessel ischemic changes again noted, unchanged relative to prior. No midline shift or extra-axial fluid collection. Ventricles are normal in size without evidence of hydrocephalus.  No diffusion-weighted signal abnormality is identified to suggest acute intracranial infarct. Gray-white matter differentiation is maintained. Normal flow  voids are seen within the intracranial vasculature. No intracranial hemorrhage identified.  The cervicomedullary junction is normal. Pituitary gland is within normal limits. Pituitary stalk is midline. The globes and optic nerves demonstrate a normal appearance with normal signal intensity.  Again seen is a in destructive soft tissue mass involving the posterior nasopharynx extending posteriorly to invade the clivus. This lesion measures approximately 2.9 cm, not significantly changed relative to the previous examination. There is involvement of the left petrous apex with invasion of the left hypoglossal canal. There is trace fluid signal intensity within the left mastoid air cells, improved from prior. Normal flow voids are seen within the distal cervical and petrous segments of the left internal carotid artery which are encased by this mass.  Calvarium is intact. Visualized upper cervical spine is within normal limits.  Scalp soft tissues are unremarkable.  Paranasal sinuses are clear.  No mastoid effusion.  MRV HEAD FINDINGS:  Dedicated MRV sequences demonstrate an attenuated left transverse sinus. There is question of minimal flow within the proximal left transverse sinus. Distally, the left transverse sinus, sigmoid sinus, and jugular vein are not visualized. Small amount of hyperintense precontrast T1 signal intensity seen within the proximal left sigmoid sinus is suggestive of thrombus (series 11, image 70). These findings may be secondary to compression from the posterior nasopharyngeal mass and/or venous sinus thrombosis.  The superior sagittal and straight sinuses as well as the deep venous veins are widely patent. The right transverse sinus, sigmoid sinus, and jugular vein are widely patent.  MRI CERVICAL SPINE FINDINGS  The study is degraded by motion artifact.  The craniocervical junction is widely patent. The vertebral bodies are normally aligned with preservation of the normal cervical lordosis. No  listhesis. Vertebral body heights are preserved. Signal intensity within the vertebral body bone marrow and spinal cord is normal.  At C2-3, there is diffuse degenerative disc osteophyte with right-sided facet arthrosis and uncovertebral osteophytosis. There is resultant moderate right foraminal  stenosis. No central canal or significant left neural foraminal narrowing identified.  At C3-4, there is broad-based degenerative disc osteophyte with bilateral facet arthrosis and uncovertebral osteophytosis. Posterior disc osteophyte mildly flattens the ventral thecal sac without significant canal stenosis. There is moderate right with mild left foraminal stenosis.  At C4-5, there is broad-based degenerative disc osteophyte with bilateral facet arthrosis, right greater than left. There is mild right foraminal stenosis. Posterior disc osteophyte mildly flattens the ventral thecal sac resulting in mild canal stenosis.  At C5-6, there is broad-based degenerative disc osteophyte with bilateral facet arthrosis and uncovertebral osteophytosis. Posterior disc osteophyte flattens and partially effaces the ventral thecal sac and results in moderate canal stenosis. There is moderate left with mild right foraminal narrowing.  At C6-7, evaluation limited by motion artifact. There is broad-based degenerative disc osteophyte with bilateral facet arthrosis. There is at least mild bilateral foraminal narrowing, slightly worse on the left. There is mild central canal stenosis.  At C7-T1, no canal or neural foraminal narrowing identified.  The visualized soft tissues of the neck are within normal limits.  IMPRESSION: MRI HEAD:  1. 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 The soft tissue mass invades the clivus posteriorly. Trace left mastoid fluid is present, improved relative to prior exam. 2. No  acute intracranial infarct or other abnormality identified. 3. Mild cerebral atrophy, unchanged.  MRV HEAD:  1. Absence of flow within the left sigmoid sinus and left jugular vein with small amount of hyperintense precontrast T1 signal intensity seen within the proximal left sigmoid sinus, most consistent with thrombus. It is unclear how much of the absence of flow within this region is related to complete thrombosis or compression from the posterior nasopharyngeal mass. The left transverse sinus is attenuated and not well visualized. 2. Otherwise normal appearance of remaining venous sinuses within the brain without evidence of venous sinus thrombosis or other abnormality.  MRI CERVICAL SPINE:  1. Broad-based degenerative disc osteophyte at C5-6 resulting in moderate canal and left foraminal stenosis with mild right foraminal narrowing, similar to prior. 2. Degenerative disc osteophyte at C6-7 with resultant mild canal and mild bilateral foraminal stenosis, similar to prior. 3. Degenerative disc osteophyte at C3-4 with resultant moderate right with mild left foraminal narrowing, similar to prior. 4. Mild canal and mild right foraminal stenosis at C4-5, similar to prior. 5. Additional multilevel degenerative disc disease as above.   Electronically Signed   By: Rise Mu M.D.   On: 08/08/2013 23:03   Mr Mrv Head Wo Cm  08/08/2013   CLINICAL DATA:  History of osteomyelitis at the skullbase.  EXAM: MRI HEAD WITHOUT CONTRAST  MRV HEAD WITHOUT CONTRAST  MRI CERVICAL SPINE WITHOUT CONTRAST  TECHNIQUE: Multiplanar, multiecho pulse sequences of the brain and surrounding structures, and cervical spine, to include the craniocervical junction and cervicothoracic junction, were obtained without intravenous contrast.  Angiographic images of the intracranial venous structures were obtained using MRV technique without intravenous contrast.  COMPARISON:  Prior MRI from 07/01/2013 and CT from 08/02/2013.  FINDINGS: MRI  HEAD FINDINGS  The study is limited by motion artifact.  Mild age-related atrophy with chronic small vessel ischemic changes again noted, unchanged relative to prior. No midline shift or extra-axial fluid collection. Ventricles are normal in size without evidence of hydrocephalus.  No diffusion-weighted signal abnormality is identified to suggest acute intracranial infarct. Gray-white matter differentiation  is maintained. Normal flow voids are seen within the intracranial vasculature. No intracranial hemorrhage identified.  The cervicomedullary junction is normal. Pituitary gland is within normal limits. Pituitary stalk is midline. The globes and optic nerves demonstrate a normal appearance with normal signal intensity.  Again seen is a in destructive soft tissue mass involving the posterior nasopharynx extending posteriorly to invade the clivus. This lesion measures approximately 2.9 cm, not significantly changed relative to the previous examination. There is involvement of the left petrous apex with invasion of the left hypoglossal canal. There is trace fluid signal intensity within the left mastoid air cells, improved from prior. Normal flow voids are seen within the distal cervical and petrous segments of the left internal carotid artery which are encased by this mass.  Calvarium is intact. Visualized upper cervical spine is within normal limits.  Scalp soft tissues are unremarkable.  Paranasal sinuses are clear.  No mastoid effusion.  MRV HEAD FINDINGS:  Dedicated MRV sequences demonstrate an attenuated left transverse sinus. There is question of minimal flow within the proximal left transverse sinus. Distally, the left transverse sinus, sigmoid sinus, and jugular vein are not visualized. Small amount of hyperintense precontrast T1 signal intensity seen within the proximal left sigmoid sinus is suggestive of thrombus (series 11, image 70). These findings may be secondary to compression from the posterior  nasopharyngeal mass and/or venous sinus thrombosis.  The superior sagittal and straight sinuses as well as the deep venous veins are widely patent. The right transverse sinus, sigmoid sinus, and jugular vein are widely patent.  MRI CERVICAL SPINE FINDINGS  The study is degraded by motion artifact.  The craniocervical junction is widely patent. The vertebral bodies are normally aligned with preservation of the normal cervical lordosis. No listhesis. Vertebral body heights are preserved. Signal intensity within the vertebral body bone marrow and spinal cord is normal.  At C2-3, there is diffuse degenerative disc osteophyte with right-sided facet arthrosis and uncovertebral osteophytosis. There is resultant moderate right foraminal stenosis. No central canal or significant left neural foraminal narrowing identified.  At C3-4, there is broad-based degenerative disc osteophyte with bilateral facet arthrosis and uncovertebral osteophytosis. Posterior disc osteophyte mildly flattens the ventral thecal sac without significant canal stenosis. There is moderate right with mild left foraminal stenosis.  At C4-5, there is broad-based degenerative disc osteophyte with bilateral facet arthrosis, right greater than left. There is mild right foraminal stenosis. Posterior disc osteophyte mildly flattens the ventral thecal sac resulting in mild canal stenosis.  At C5-6, there is broad-based degenerative disc osteophyte with bilateral facet arthrosis and uncovertebral osteophytosis. Posterior disc osteophyte flattens and partially effaces the ventral thecal sac and results in moderate canal stenosis. There is moderate left with mild right foraminal narrowing.  At C6-7, evaluation limited by motion artifact. There is broad-based degenerative disc osteophyte with bilateral facet arthrosis. There is at least mild bilateral foraminal narrowing, slightly worse on the left. There is mild central canal stenosis.  At C7-T1, no canal or neural  foraminal narrowing identified.  The visualized soft tissues of the neck are within normal limits.  IMPRESSION: MRI HEAD:  1. 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 The soft tissue mass invades the clivus posteriorly. Trace left mastoid fluid is present, improved relative to prior exam. 2. No acute intracranial infarct or other abnormality identified. 3. Mild cerebral atrophy, unchanged.  MRV HEAD:  1. Absence of flow within the left sigmoid sinus and left jugular vein with small amount of hyperintense precontrast T1 signal intensity seen within the proximal left sigmoid sinus, most consistent with thrombus. It is unclear how much of the absence of flow within this region is related to complete thrombosis or compression from the posterior nasopharyngeal mass. The left transverse sinus is attenuated and not well visualized. 2. Otherwise normal appearance of remaining venous sinuses within the brain without evidence of venous sinus thrombosis or other abnormality.  MRI CERVICAL SPINE:  1. Broad-based degenerative disc osteophyte at C5-6 resulting in moderate canal and left foraminal stenosis with mild right foraminal narrowing, similar to prior. 2. Degenerative disc osteophyte at C6-7 with resultant mild canal and mild bilateral foraminal stenosis, similar to prior. 3. Degenerative disc osteophyte at C3-4 with resultant moderate right with mild left foraminal narrowing, similar to prior. 4. Mild canal and mild right foraminal stenosis at C4-5, similar to prior. 5. Additional multilevel degenerative disc disease as above.   Electronically Signed   By: Rise Mu M.D.   On: 08/08/2013 23:03    Assessment: Given the persistent evidence of osteomyelitis at the base of the skull I would recommend discharge home on his expanded antibiotic regimen of vancomycin and  meropenem.  Plan: 1. Continue vancomycin and meropenem at least 3 more weeks 2. I will arrange followup in our clinic  Cliffton Asters, MD [facility] [phone] pager   [phone] cell 08/09/2013, 11:32 AM

## 2013-08-09 NOTE — Progress Notes (Addendum)
ANTIBIOTIC CONSULT NOTE  Pharmacy Consult for Vancomycin, Meropenem Indication: skull osteomyelitis  Allergies  Allergen Reactions  . Bee Pollen Anaphylaxis  . Tetanus Toxoids Swelling    Tetanus Shot  . Hydrocodone Other (See Comments)    Confusion, "talking out of his head"    Patient Measurements: Height: 5\' 2"  (157.5 cm) Weight: 136 lb 3.9 oz (61.8 kg) IBW/kg (Calculated) : 54.6  Vital Signs: Temp: 98.8 F (37.1 C) (02/13 0455) Temp src: Oral (02/13 0455) BP: 117/63 mmHg (02/13 0455) Pulse Rate: 74 (02/13 0455)  Labs:  Recent Labs  08/07/13 1650 08/08/13 0640  WBC 11.1* 9.8  HGB 10.6* 10.1*  PLT 161 159  CREATININE 0.78 0.72   Estimated Creatinine Clearance: 55.9 ml/min (by C-G formula based on Cr of 0.72). No results found for this basename: VANCOTROUGH, VANCOPEAK, VANCORANDOM, GENTTROUGH, GENTPEAK, GENTRANDOM, TOBRATROUGH, TOBRAPEAK, TOBRARND, AMIKACINPEAK, AMIKACINTROU, AMIKACIN,  in the last 72 hours   Microbiology: Recent Results (from the past 720 hour(s))  CULTURE, BLOOD (ROUTINE X 2)     Status: None   Collection Time    08/07/13  6:40 PM      Result Value Ref Range Status   Specimen Description BLOOD LEFT HAND   Final   Special Requests BOTTLES DRAWN AEROBIC AND ANAEROBIC 10CC   Final   Culture  Setup Time     Final   Value: 08/07/2013 22:06     Performed at [facility]   Culture     Final   Value:        BLOOD CULTURE RECEIVED NO GROWTH TO DATE CULTURE WILL BE HELD FOR 5 DAYS BEFORE ISSUING A FINAL NEGATIVE REPORT     Performed at [facility]   Report Status PENDING   Incomplete  CULTURE, BLOOD (ROUTINE X 2)     Status: None   Collection Time    08/07/13  6:55 PM      Result Value Ref Range Status   Specimen Description BLOOD LEFT ARM   Final   Special Requests BOTTLES DRAWN AEROBIC AND ANAEROBIC 10CC   Final   Culture  Setup Time     Final   Value: 08/07/2013 22:06     Performed at [facility]   Culture      Final   Value:        BLOOD CULTURE RECEIVED NO GROWTH TO DATE CULTURE WILL BE HELD FOR 5 DAYS BEFORE ISSUING A FINAL NEGATIVE REPORT     Performed at [facility]   Report Status PENDING   Incomplete   Assessment: 80 year old male who underwent nasal polypectomy in October 2014.  He was readmitted in January of 2015 with acute sinuitis with culture growing Serratia.  He completed 21 days of cefepime therapy on January 29.  Head CT on 08/02/13 showed destructive mass of the left skull base consistent with osteomyelitis. ID following and stopped cefepime. He is to continue on vancomycin and meropenem. Renal function is stable.  Pharmacy also asked to dose Xarelto for VTE prophylaxis. Reasoning discussed in note written by Pola Corn, PharmD on 2/12. Hgb and plts are stable. No bleeding noted.  Goal of Therapy:  Vancomycin trough level 15-20 mcg/ml Appropriate meropenem dosing  Plan:  1. Change Meropenem to 1g IV q8h 2. Vancomycin 500mg  IV q12h 3. Follow up cultures, renal function, ID plans 4. Vancomycin trough tonight at 1730 before 5th dose  5. Continue Xarelto 10mg  qsupper 6. Follow CBC and s/s bleeding  Lauren D. Bajbus, PharmD, BCPS Clinical Pharmacist Pager: [phone] 08/09/2013 10:15 AM  Vancomycin trough reported at 6.3 mcg/ml.  Will increase Vancomycin to 750 mg IV q12h, repeat trough at steady state.   Thank you for allowing pharmacy to be a part of this patients care team.  Lovenia KimJulie Mykaylah Ballman Pharm.D., BCPS Clinical Pharmacist 08/09/2013 8:30 PM Pager: [phone] Phone: [phone]

## 2013-08-09 NOTE — Progress Notes (Signed)
Patient Demographics  Jason [name], is [name] 80 y.o. male, DOB - June 04, 1930, WUJ:[id number]  Admit date - 08/07/2013   Admitting Physician Jason Sea, MD  Outpatient Primary MD for the patient is Jason A, MD  LOS - 2   No chief complaint on file.       Assessment & Plan    1. Skull base osteomyelitis - likely okay however recent CT scan does show worsening of his osteomyelitis despite 6 weeks of IV Rocephin. Admitted directly from ID office of Jason [name], started on vancomycin and meropenem, at this point family and patient do not want any surgical interventions they want medical treatment only. ID physician Jason [name] have adjusted his antibiotics, he already has [name] PICC line. ENT following. Have requested Jason [name] neurosurgeon to see the patient formally.    2. Left-sided tongue weakness, dysphagia. Secondary to base of the skull osteomyelitis. Supportive care as in #1, will have speech see him, had extensive speech evaluation last admission and was placed on dysphagia 1 diet which will be started, speech following here as well.      3. Moderate to severe protein calorie malnutrition due to #1 and 2 above - dysphagia 1 diet orally with aspiration precautions, protein supplementations through 2, nutritionist consult.      4. 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     Code Status: DNR  Family Communication: wife  Disposition Plan: Home   Procedures MRI brain and C-spine, MRV brain   Consults ENT,ID, N Surg   Medications  Scheduled Meds: . acidophilus  1 capsule Oral Daily  . feeding supplement (JEVITY 1.2 CAL)  480 mL Per Tube TID AC  . lactose free  nutrition  237 mL Oral TID PC & HS  . meloxicam  15 mg Oral Daily  . meropenem (MERREM) IV  1 g Intravenous Q12H  . mirtazapine  15 mg Oral QHS  . rivaroxaban  10 mg Oral Q supper  . triamcinolone cream   Topical BID  . vancomycin  500 mg Intravenous Q12H   Continuous Infusions:   PRN Meds:.alum & mag hydroxide-simeth, calcium carbonate, diazepam, guaiFENesin-dextromethorphan, morphine injection, ondansetron (ZOFRAN) IV, ondansetron, sodium chloride, traMADol  DVT Prophylaxis  Xaralto   Lab Results  Component Value Date   PLT 159 08/08/2013    Antibiotics    Anti-infectives   Start     Dose/Rate Route Frequency Ordered Stop   08/07/13 2200  ceFEPIme (MAXIPIME) 2 g in dextrose 5 % 50 mL IVPB  Status:  Discontinued     2 g 100 mL/hr over 30 Minutes Intravenous Every 12 hours 08/07/13 1725 08/08/13 1251   08/07/13 1830  meropenem (MERREM) 1 g in sodium chloride 0.9 % 100 mL IVPB     1 g 200 mL/hr over 30 Minutes Intravenous Every 12 hours 08/07/13 1736     08/07/13 1800  vancomycin (VANCOCIN) 500 mg in sodium chloride 0.9 % 100 mL IVPB     500 mg 100 mL/hr over 60 Minutes Intravenous Every 12 hours 08/07/13 1736            Subjective:  Jason [name] today has, No headache, No chest pain, No abdominal pain - No Nausea, No new weakness tingling or numbness, No Cough - SOB.    Objective:   Filed Vitals:   08/08/13 1510 08/08/13 2112 08/09/13 0437 08/09/13 0455  BP: 109/71 106/61  117/63  Pulse: 69 64  74  Temp: 98.5 F (36.9 C) 98.1 F (36.7 C)  98.8 F (37.1 C)  TempSrc: Oral Oral  Oral  Resp: 18 20  20   Height:      Weight:   61.8 kg (136 lb 3.9 oz)   SpO2: 95% 92%  92%    Wt Readings from Last 3 Encounters:  08/09/13 61.8 kg (136 lb 3.9 oz)  08/07/13 60.328 kg (133 lb)  07/25/13 56.7 kg (125 lb)     Intake/Output Summary (Last 24 hours) at 08/09/13 1016 Last data filed at 08/09/13 0900  Gross per 24 hour  Intake   1070 ml  Output    335 ml  Net     735 ml     Physical Exam  Awake Alert, Oriented X 3, No new F.N deficits, Normal affect [place].AT,PERRAL Supple Neck,No JVD, No cervical lymphadenopathy appriciated.  Symmetrical Chest wall movement, Good air movement bilaterally, CTAB RRR,No Gallops,Rubs or new Murmurs, No Parasternal Heave +ve B.Sounds, Abd Soft, Non tender, No organomegaly appriciated, No rebound - guarding or rigidity. PEG in place No Cyanosis, Clubbing or edema, No new Rash or bruise      Data Review   Micro Results Recent Results (from the past 240 hour(s))  CULTURE, BLOOD (ROUTINE X 2)     Status: None   Collection Time    08/07/13  6:40 PM      Result Value Ref Range Status   Specimen Description BLOOD LEFT HAND   Final   Special Requests BOTTLES DRAWN AEROBIC AND ANAEROBIC 10CC   Final   Culture  Setup Time     Final   Value: 08/07/2013 22:06     Performed at [facility]   Culture     Final   Value:        BLOOD CULTURE RECEIVED NO GROWTH TO DATE CULTURE WILL BE HELD FOR 5 DAYS BEFORE ISSUING [name] FINAL NEGATIVE REPORT     Performed at [facility]   Report Status PENDING   Incomplete  CULTURE, BLOOD (ROUTINE X 2)     Status: None   Collection Time    08/07/13  6:55 PM      Result Value Ref Range Status   Specimen Description BLOOD LEFT ARM   Final   Special Requests BOTTLES DRAWN AEROBIC AND ANAEROBIC 10CC   Final   Culture  Setup Time     Final   Value: 08/07/2013 22:06     Performed at [facility]   Culture     Final   Value:        BLOOD CULTURE RECEIVED NO GROWTH TO DATE CULTURE WILL BE HELD FOR 5 DAYS BEFORE ISSUING [name] FINAL NEGATIVE REPORT     Performed at [facility]   Report Status PENDING   Incomplete    Radiology Reports Dg Swallowing [facility]  07/09/2013   Lenor Derrick, CCC-SLP     07/09/2013  3:06 PM Objective Swallowing Evaluation: Modified Barium Swallowing Study   Patient Details  Name: Jason [name] MRN: [id number] Date of Birth:  May 20, 1930  Today's Date: 07/09/2013 Time: 1330-1420 SLP Time Calculation (min): 50 min  Past Medical History:  Past Medical History  Diagnosis Date  . Arthritis   . Insomnia     takes Trazodone nightly  . PONV (postoperative nausea and vomiting)   . Hyperlipidemia     was on medication but has been off for [name] while  . Dizziness     was taking Meclizine but doesn't take now;found out that HR was  45  . Joint pain   . Joint swelling   . Itchy skin     scalp and uses [name] cream  . H/O hiatal hernia     takes Omeprazole daily  . Hemorrhoids   . Constipation   . History of colon polyps   . History of colitis     many yrs ago  . Urinary frequency   . History of kidney stones     passed on his own  . Enlarged prostate   . Anxiety     takes Diazepam daily prn  . Depression     takes Trazodone nightly  . Insomnia     takes Trazodone nightly   Past Surgical History:  Past Surgical History  Procedure Laterality Date  . Right knee arthroscopy    . Hernia repair      double  . Hemorrhoidectomy with hemorrhoid banding    . Tonsillectomy    . Bilateral cataract surgery    . Circumcision    . Colonoscopy    . Esophagogastroduodenoscopy    . Ear cyst excision N/[name] 04/05/2013    Procedure: CYST REMOVAL;  Surgeon: Melvenia Beam, MD;   Location: [facility] OR;  Service: ENT;  Laterality: N/[name];  . Septoplasty N/[name] 04/05/2013    Procedure: SEPTOPLASTY;  Surgeon: Melvenia Beam, MD;  Location:  [facility] OR;  Service: ENT;  Laterality: N/[name];  . Direct laryngoscopy N/[name] 06/26/2013    Procedure: DIRECT LARYNGOSCOPY;  Surgeon: Christia Reading, MD;   Location: [facility] OR;  Service: ENT;  Laterality: N/[name];  . Sinus endo w/fusion N/[name] 07/03/2013    Procedure: ENDOSCOPIC SINUS SURGERY WITH FUSION NAVIGATION with  By-Nasopharongeal Biopsy;  Surgeon: Christia Reading, MD;  Location:  [facility] OR;  Service: ENT;  Laterality: N/[name];   HPI:  Chief Complaint: left nasopharynx mass, left tongue and palate  weakness     Assessment / Plan / Recommendation Clinical Impression  Dysphagia Diagnosis: Mild  oral phase dysphagia;Moderate  pharyngeal phase dysphagia Clinical impression: Patient presents with [name] mild oral and  moderate sensori-motor pharyngeal dysphagia, characterized by  lingual weakness and decresed ability to manipulate solid  boluses, delayed swallow initiation, decreased base of tongue  contraction to the pharyngeal wall, decreased velopharyngeal  closure, decreased anterior excursion and elevation of the hyoid  and decreased laryngeal elevation.  These deficits result in  severe pooling/residue with all consistencies in the valleculae  and pyriform sinuses.  However, when turning head to left and  swallowing twice with each bite/sip, there was minimal laryngeal  residue.  Pt. did not aspirate during this study, but is at risk  for aspiration if he is unable to prevent or clear laryngeal  residue.  Pt., wife, and grand-daughter were educated  extensively, and case was discussed with Dr. Jenne Pane, RN, and  nutritionist.  MD ordered d/c of Panda tube with hopes of  adequate po intake so that PEG might be avoided.      Treatment Recommendation  Therapy as outlined in treatment plan below    Diet Recommendation Dysphagia 1 (Puree);Thin liquid   Liquid Administration via: Cup;No  straw Medication Administration: Whole meds with puree Supervision: Staff to assist with self feeding;Full  supervision/cueing for compensatory strategies Compensations: Slow rate;Small sips/bites;Multiple dry swallows  after each bite/sip Postural Changes and/or Swallow Maneuvers: Out of bed for  meals;Seated upright 90 degrees;Head turn left during swallow    Other  Recommendations Oral Care Recommendations: Oral care  BID;Staff/trained caregiver to provide oral care Other Recommendations: Have oral suction available;Clarify  dietary restrictions   Follow Up Recommendations       Frequency and Duration min 2x/week  2 weeks   Pertinent Vitals/Pain afebrile    SLP Swallow Goals  see care plan   General HPI: Chief Complaint: left  nasopharynx mass, left tongue  and palate weakness Type of Study: Modified Barium Swallowing Study Reason for Referral: Objectively evaluate swallowing function Previous Swallow Assessment: BSE this am Diet Prior to this Study: Thin liquids Temperature Spikes Noted: No Respiratory Status: Room air History of Recent Intubation: Yes Length of Intubations (days): 1 days Date extubated: 06/26/13 Behavior/Cognition: Alert;Cooperative Oral Cavity - Dentition: Adequate natural dentition Oral Motor / Sensory Function: Impaired - see Bedside swallow  eval Self-Feeding Abilities: Able to feed self Patient Positioning: Upright in chair Baseline Vocal Quality: Clear;Low vocal intensity Volitional Cough: Weak Volitional Swallow: Able to elicit Pharyngeal Secretions: Not observed secondary MBS    Reason for Referral Objectively evaluate swallowing function   Oral Phase Oral Preparation/Oral Phase Oral Phase: Impaired Oral - Solids Oral - Regular: Impaired mastication;Weak lingual  manipulation;Incomplete tongue to palate contact;Delayed oral  transit   Pharyngeal Phase Pharyngeal Phase Pharyngeal Phase: Impaired Pharyngeal - Nectar Pharyngeal - Nectar Cup: Premature spillage to valleculae;Reduced  pharyngeal peristalsis;Reduced epiglottic inversion;Reduced  anterior laryngeal mobility;Reduced laryngeal elevation;Reduced  tongue base retraction;Pharyngeal residue - valleculae;Pharyngeal  residue - pyriform sinuses;Pharyngeal residue - posterior  pharnyx;Compensatory strategies attempted (Comment);Delayed  swallow initiation (chin tuck not effective; Better with head  turn to left!) Pharyngeal - Thin Pharyngeal - Thin Cup: Premature spillage to valleculae;Reduced  pharyngeal peristalsis;Reduced epiglottic inversion;Reduced  anterior laryngeal mobility;Reduced laryngeal elevation;Reduced  tongue base retraction;Pharyngeal residue - valleculae;Pharyngeal  residue - pyriform sinuses;Pharyngeal residue - posterior  pharnyx;Compensatory  strategies attempted (Comment);Delayed  swallow initiation Pharyngeal - Solids Pharyngeal - Puree: Premature spillage to valleculae;Reduced  pharyngeal peristalsis;Reduced epiglottic inversion;Reduced  anterior laryngeal mobility;Reduced laryngeal elevation;Reduced  tongue base retraction;Pharyngeal residue - valleculae;Pharyngeal  residue - pyriform sinuses;Pharyngeal residue - posterior  pharnyx;Compensatory strategies attempted (Comment);Delayed  swallow initiation  Cervical Esophageal Phase    GO    Cervical Esophageal Phase Cervical Esophageal Phase: Vicente MassonWFL         Willis, Lori T 07/09/2013, 3:06 PM    Ct Temporal Bones W/cm  08/02/2013   CLINICAL DATA:  Left hypoglossal neuropathy. Skull base osteomyelitis. Prior surgery  EXAM: CT TEMPORAL BONES WITH CONTRAST  TECHNIQUE: Axial and coronal plane CT imaging of the petrous temporal bones was performed with thin-collimation image reconstruction after intravenous contrast administration. Multiplanar CT image reconstructions were also generated.  CONTRAST:  75mL OMNIPAQUE IOHEXOL 300 MG/ML  SOLN  COMPARISON:  CT sinus 07/02/2013  FINDINGS: Destructive process in the left skullbase, consistent with osteomyelitis which has been proven by tissue sampling in the operating room. There is erosion of the hypoglossal canal on the left consistent with [name] history of hypoglossal neuropathy. This is consistent with infection of the canal. There is also erosion of the left side of the clivus and left sphenoid bone. There is erosion of the wall of the carotid canal consistent with osteomyelitis.  There is increased edema within the carotid canal and the left internal carotid artery could be infected. The left internal carotid artery appears patent. There are gas bubbles below the carotid canal which may be related to recent biopsy or could be related to the infection.  There is edema surrounding the left jugular vein which may be narrowed or occluded. There is filling defect in the  left transverse sinus compatible with thrombus. Right transverse sinus is normal. The cavernous sinus is symmetric without evidence of venous thrombosis.  The left mastoid sinus is clear aside from some minimal mucosal edema in the mastoid tip. This has improved since the prior CT. Left middle ear is clear. The right mastoid sinus and middle ear are clear. Visualized paranasal sinuses reveal mild mucosal edema in the maxillary sinuses.  No intracranial abscess is identified on these limited images of the brain. If there is concern of cerebritis, MRI with contrast is suggested.  IMPRESSION: Destructive mass of the left skullbase consistent with osteomyelitis. There is erosion of the hypoglossal canal on the left, consistent with osteomyelitis and hypoglossal neuropathy. There is destruction of bone around the carotid canal on the left raising the possibility of carotid infection. There appears to be flow in the left internal carotid artery.  There is [name] small amount of thrombus in the left transverse sinus. Left jugular vein appears patent however there is considerable edema around the jugular bulb which may or may not be patent.  Bony destruction is similar to the prior CT.   Electronically Signed   By: Marlan Palau M.D.   On: 08/02/2013 16:23    CBC  Recent Labs Lab 08/07/13 1650 08/08/13 0640  WBC 11.1* 9.8  HGB 10.6* 10.1*  HCT 31.4* 30.5*  PLT 161 159  MCV 86.5 86.6  MCH 29.2 28.7  MCHC 33.8 33.1  RDW 16.6* 16.5*    Chemistries   Recent Labs Lab 08/07/13 1650 08/08/13 0640  NA 139 140  K 4.5 4.3  CL 101 105  CO2 25 22  GLUCOSE 106* 94  BUN 28* 23  CREATININE 0.78 0.72  CALCIUM 8.6 8.3*   ------------------------------------------------------------------------------------------------------------------ estimated creatinine clearance is 55.9 ml/min (by C-G formula based on Cr of  0.72). ------------------------------------------------------------------------------------------------------------------ No results found for this basename: HGBA1C,  in the last 72 hours ------------------------------------------------------------------------------------------------------------------ No results found for this basename: CHOL, HDL, LDLCALC, TRIG, CHOLHDL, LDLDIRECT,  in the last 72 hours ------------------------------------------------------------------------------------------------------------------ No results found for this basename: TSH, T4TOTAL, FREET3, T3FREE, THYROIDAB,  in the last 72 hours ------------------------------------------------------------------------------------------------------------------ No results found for this basename: VITAMINB12, FOLATE, FERRITIN, TIBC, IRON, RETICCTPCT,  in the last 72 hours  Coagulation profile No results found for this basename: INR, PROTIME,  in the last 168 hours  No results found for this basename: DDIMER,  in the last 72 hours  Cardiac Enzymes No results found for this basename: CK, CKMB, TROPONINI, MYOGLOBIN,  in the last 168 hours ------------------------------------------------------------------------------------------------------------------ No components found with this basename: POCBNP,      Time Spent in minutes   35   Susa Raring K M.D on 08/09/2013 at 10:16 AM  Between 7am to 7pm - Pager - [phone]  After 7pm go to [email or web address] - password [email or web address]  And look for the night coverage person covering for me after hours  [facility] Office  [phone]

## 2013-08-10 MED ORDER — RISAQUAD PO CAPS: 1.0000 | ORAL_CAPSULE | Freq: Every day | ORAL | Status: DC

## 2013-08-10 NOTE — Discharge Summary (Signed)
Jason [name], is a 80 y.o. male  DOB 09/22/1929  MRN [id number].  Admission date:  08/07/2013  Admitting Physician  Thurnell Lose, MD  Discharge Date:  08/10/2013   Primary MD  ARONSON,RICHARD A, MD  Recommendations for primary care physician for things to follow:   Follow the patient clinically, follow CBC BMP, nutritional status and depression.   Admission Diagnosis  osteomylitis of skull   Discharge Diagnosis  osteomylitis of skull     Principal Problem:   Osteomyelitis of skull Active Problems:   GERD (gastroesophageal reflux disease)   Protein-calorie malnutrition, severe   Acute sinusitis   Normocytic anemia      Past Medical History  Diagnosis Date  . Arthritis   . Insomnia     takes Trazodone nightly  . PONV (postoperative nausea and vomiting)   . Hyperlipidemia     was on medication but has been off for a while  . Dizziness     was taking Meclizine but doesn't take now;found out that HR was 45  . Joint pain   . Joint swelling   . Itchy skin     scalp and uses a cream  . H/O hiatal hernia     takes Omeprazole daily  . Hemorrhoids   . Constipation   . History of colon polyps   . History of colitis     many yrs ago  . Urinary frequency   . History of kidney stones     passed on his own  . Enlarged prostate   . Anxiety     takes Diazepam daily prn  . Depression     takes Trazodone nightly  . Insomnia     takes Trazodone nightly  . GERD (gastroesophageal reflux disease)   . 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 Right knee arthroscopy    . Hernia repair      double  . Hemorrhoidectomy with hemorrhoid banding    . Tonsillectomy    . Bilateral cataract surgery    . Circumcision    . Colonoscopy    . Esophagogastroduodenoscopy    . Ear  cyst excision N/A 04/05/2013    Procedure: CYST REMOVAL;  Surgeon: Ruby Cola, MD;  Location: [place];  Service: ENT;  Laterality: N/A;  . Septoplasty N/A 04/05/2013    Procedure: SEPTOPLASTY;  Surgeon: Ruby Cola, MD;  Location: [facility] OR;  Service: ENT;  Laterality: N/A;  . Direct laryngoscopy N/A 06/26/2013    Procedure: DIRECT LARYNGOSCOPY;  Surgeon: Melida Quitter, MD;  Location: [place];  Service: ENT;  Laterality: N/A;  . Sinus endo w/fusion N/A 07/03/2013    Procedure: ENDOSCOPIC SINUS SURGERY WITH FUSION NAVIGATION with By-Nasopharongeal Biopsy;  Surgeon: Melida Quitter, MD;  Location: [place];  Service: ENT;  Laterality: N/A;  . Sinus exploration Bilateral 07/11/2013    Procedure:  BEDSIDE  SINUS EXPLORATION ;  Surgeon: Melida Quitter, MD;  Location: [place];  Service: ENT;  Laterality:  Bilateral;  . Esophagogastroduodenoscopy N/A 07/15/2013    Procedure: ESOPHAGOGASTRODUODENOSCOPY (EGD);  Surgeon: Gwenyth Ober, MD;  Location: [facility] ENDOSCOPY;  Service: General;  Laterality: N/A;  . Peg placement N/A 07/15/2013    Procedure: PERCUTANEOUS ENDOSCOPIC GASTROSTOMY (PEG) PLACEMENT;  Surgeon: Gwenyth Ober, MD;  Location: [facility] ENDOSCOPY;  Service: General;  Laterality: N/A;  . Peripherally inserted central catheter insertion       Discharge Condition: stable   Follow UP  Follow-up Information   Follow up with ARONSON,RICHARD A, MD. Schedule an appointment as soon as possible for a visit in 1 week.   Specialty:  Internal Medicine   Contact information:   [facility] [place] [address] [phone]       Follow up with Carlyle Basques, MD. Schedule an appointment as soon as possible for a visit in 1 week.   Specialty:  Infectious Diseases   Contact information:   [address] [place] [address] [phone]       Follow up with BATES, DWIGHT, MD. Schedule an appointment as soon as possible for a visit in 1 week.   Specialty:   Otolaryngology   Contact information:   [address] [place] [address] [phone]         Discharge Instructions  and  Discharge Medications      Discharge Orders   Future Appointments Provider Department Dept Phone   08/29/2013 9:00 AM Carlyle Basques, MD [facility] [phone]   Future Orders Complete By Expires   Diet - low sodium heart healthy  As directed    Discharge instructions  As directed    Comments:     Follow with Primary MD ARONSON,RICHARD A, MD in 7 days   Get CBC, CMP, checked 7 days by Primary MD and again as instructed by your Primary MD.     Activity: As tolerated with Full fall precautions use walker/cane & assistance as needed   Disposition Home    Diet: Dysphagia 1 by mouth with feeding assistance and aspiration precautions, continue diet via feeding tube as before  For Heart failure patients - Check your Weight same time everyday, if you gain over 2 pounds, or you develop in leg swelling, experience more shortness of breath or chest pain, call your Primary MD immediately. Follow Cardiac Low Salt Diet and 1.8 lit/day fluid restriction.   On your next visit with her primary care physician please Get Medicines reviewed and adjusted.  Please request your Prim.MD to go over all Hospital Tests and Procedure/Radiological results at the follow up, please get all Hospital records sent to your Prim MD by signing hospital release before you go home.   If you experience worsening of your admission symptoms, develop shortness of breath, life threatening emergency, suicidal or homicidal thoughts you must seek medical attention immediately by calling 911 or calling your MD immediately  if symptoms less severe.  You Must read complete instructions/literature along with all the possible adverse reactions/side effects for all the Medicines you take and that have been prescribed to you. Take any new Medicines  after you have completely understood and accpet all the possible adverse reactions/side effects.   Do not drive and provide baby sitting services if your were admitted for syncope or siezures until you have seen by Primary MD or a Neurologist and advised to do so again.  Do not drive when taking Pain medications.  Do not take more than prescribed Pain, Sleep and Anxiety Medications  Special Instructions: If you have smoked or chewed Tobacco  in the last 2 yrs please stop smoking, stop any regular Alcohol  and or any Recreational drug use.  Wear Seat belts while driving.   Please note  You were cared for by a hospitalist during your hospital stay. If you have any questions about your discharge medications or the care you received while you were in the hospital after you are discharged, you can call the unit and asked to speak with the hospitalist on call if the hospitalist that took care of you is not available. Once you are discharged, your primary care physician will handle any further medical issues. Please note that NO REFILLS for any discharge medications will be authorized once you are discharged, as it is imperative that you return to your primary care physician (or establish a relationship with a primary care physician if you do not have one) for your aftercare needs so that they can reassess your need for medications and monitor your lab values.   Increase activity slowly  As directed        Medication List    STOP taking these medications       dextrose 5 % SOLN 50 mL with ceFEPIme 2 G SOLR 2 g      TAKE these medications       acetaminophen 325 MG tablet  Commonly known as:  TYLENOL  Take 650 mg by mouth every 6 (six) hours as needed.     acidophilus Caps capsule  Take 1 capsule by mouth daily.     betamethasone dipropionate 0.05 % cream  Commonly known as:  DIPROLENE  Apply 1 application topically daily as needed (itchy scalp).     diazepam 5 MG tablet  Commonly  known as:  VALIUM  Take 2.5 mg by mouth daily as needed for anxiety.     meloxicam 15 MG tablet  Commonly known as:  MOBIC  Take 15 mg by mouth daily.     mirtazapine 15 MG tablet  Commonly known as:  REMERON  Take 15 mg by mouth at bedtime.     predniSONE 20 MG tablet  Commonly known as:  DELTASONE  Take 1 tablet (20 mg total) by mouth daily.     sodium chloride 0.9 % SOLN 100 mL with meropenem 1 G SOLR 1 g  Inject 1 g into the vein every 12 (twelve) hours.     sodium chloride 0.9 % SOLN 100 mL with vancomycin 500 MG SOLR 500 mg  Inject 500 mg into the vein every 12 (twelve) hours. Home health nurse to draw vancomycin levels and forward to home health pharmacy and ID office of Dr.Snyder     traMADol 50 MG tablet  Commonly known as:  ULTRAM  Take 50 mg by mouth every 6 (six) hours as needed.     TUMS 500 MG chewable tablet  Generic drug:  calcium carbonate  Chew 1 tablet by mouth daily as needed for indigestion or heartburn.          Diet and Activity recommendation: See Discharge Instructions above   Consults obtained - ENT, N Surg, ID   Major procedures and Radiology Reports - PLEASE review detailed and final reports for all details, in brief -       Mr Brain Wo Contrast  08/08/2013   CLINICAL DATA:  History of osteomyelitis at the skullbase.  EXAM: MRI  HEAD WITHOUT CONTRAST  MRV HEAD WITHOUT CONTRAST  MRI CERVICAL SPINE WITHOUT CONTRAST  TECHNIQUE: Multiplanar, multiecho pulse sequences of the brain and surrounding structures, and cervical spine, to include the craniocervical junction and cervicothoracic junction, were obtained without intravenous contrast.  Angiographic images of the intracranial venous structures were obtained using MRV technique without intravenous contrast.  COMPARISON:  Prior MRI from 07/01/2013 and CT from 08/02/2013.  FINDINGS: MRI HEAD FINDINGS  The study is limited by motion artifact.  Mild age-related atrophy with chronic small vessel  ischemic changes again noted, unchanged relative to prior. No midline shift or extra-axial fluid collection. Ventricles are normal in size without evidence of hydrocephalus.  No diffusion-weighted signal abnormality is identified to suggest acute intracranial infarct. Gray-white matter differentiation is maintained. Normal flow voids are seen within the intracranial vasculature. No intracranial hemorrhage identified.  The cervicomedullary junction is normal. Pituitary gland is within normal limits. Pituitary stalk is midline. The globes and optic nerves demonstrate a normal appearance with normal signal intensity.  Again seen is a in destructive soft tissue mass involving the posterior nasopharynx extending posteriorly to invade the clivus. This lesion measures approximately 2.9 cm, not significantly changed relative to the previous examination. There is involvement of the left petrous apex with invasion of the left hypoglossal canal. There is trace fluid signal intensity within the left mastoid air cells, improved from prior. Normal flow voids are seen within the distal cervical and petrous segments of the left internal carotid artery which are encased by this mass.  Calvarium is intact. Visualized upper cervical spine is within normal limits.  Scalp soft tissues are unremarkable.  Paranasal sinuses are clear.  No mastoid effusion.  MRV HEAD FINDINGS:  Dedicated MRV sequences demonstrate an attenuated left transverse sinus. There is question of minimal flow within the proximal left transverse sinus. Distally, the left transverse sinus, sigmoid sinus, and jugular vein are not visualized. Small amount of hyperintense precontrast T1 signal intensity seen within the proximal left sigmoid sinus is suggestive of thrombus (series 11, image 70). These findings may be secondary to compression from the posterior nasopharyngeal mass and/or venous sinus thrombosis.  The superior sagittal and straight sinuses as well as the  deep venous veins are widely patent. The right transverse sinus, sigmoid sinus, and jugular vein are widely patent.  MRI CERVICAL SPINE FINDINGS  The study is degraded by motion artifact.  The craniocervical junction is widely patent. The vertebral bodies are normally aligned with preservation of the normal cervical lordosis. No listhesis. Vertebral body heights are preserved. Signal intensity within the vertebral body bone marrow and spinal cord is normal.  At C2-3, there is diffuse degenerative disc osteophyte with right-sided facet arthrosis and uncovertebral osteophytosis. There is resultant moderate right foraminal stenosis. No central canal or significant left neural foraminal narrowing identified.  At C3-4, there is broad-based degenerative disc osteophyte with bilateral facet arthrosis and uncovertebral osteophytosis. Posterior disc osteophyte mildly flattens the ventral thecal sac without significant canal stenosis. There is moderate right with mild left foraminal stenosis.  At C4-5, there is broad-based degenerative disc osteophyte with bilateral facet arthrosis, right greater than left. There is mild right foraminal stenosis. Posterior disc osteophyte mildly flattens the ventral thecal sac resulting in mild canal stenosis.  At C5-6, there is broad-based degenerative disc osteophyte with bilateral facet arthrosis and uncovertebral osteophytosis. Posterior disc osteophyte flattens and partially effaces the ventral thecal sac and results in moderate canal stenosis. There is moderate left with mild right foraminal narrowing.  At C6-7, evaluation limited by motion artifact. There is broad-based degenerative disc osteophyte with bilateral facet arthrosis. There is at least mild bilateral foraminal narrowing, slightly worse on the left. There is mild central canal stenosis.  At C7-T1, no canal or neural foraminal narrowing identified.  The visualized soft tissues of the neck are within normal limits.   IMPRESSION: MRI HEAD:  1. 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 The soft tissue mass invades the clivus posteriorly. Trace left mastoid fluid is present, improved relative to prior exam. 2. No acute intracranial infarct or other abnormality identified. 3. Mild cerebral atrophy, unchanged.  MRV HEAD:  1. Absence of flow within the left sigmoid sinus and left jugular vein with small amount of hyperintense precontrast T1 signal intensity seen within the proximal left sigmoid sinus, most consistent with thrombus. It is unclear how much of the absence of flow within this region is related to complete thrombosis or compression from the posterior nasopharyngeal mass. The left transverse sinus is attenuated and not well visualized. 2. Otherwise normal appearance of remaining venous sinuses within the brain without evidence of venous sinus thrombosis or other abnormality.  MRI CERVICAL SPINE:  1. Broad-based degenerative disc osteophyte at C5-6 resulting in moderate canal and left foraminal stenosis with mild right foraminal narrowing, similar to prior. 2. Degenerative disc osteophyte at C6-7 with resultant mild canal and mild bilateral foraminal stenosis, similar to prior. 3. Degenerative disc osteophyte at C3-4 with resultant moderate right with mild left foraminal narrowing, similar to prior. 4. Mild canal and mild right foraminal stenosis at C4-5, similar to prior. 5. Additional multilevel degenerative disc disease as above.   Electronically Signed   By: Jeannine Boga M.D.   On: 08/08/2013 23:03   Mr Cervical Spine Wo Contrast  08/08/2013   CLINICAL DATA:  History of osteomyelitis at the skullbase.  EXAM: MRI HEAD WITHOUT CONTRAST  MRV HEAD WITHOUT CONTRAST  MRI CERVICAL SPINE WITHOUT CONTRAST  TECHNIQUE: Multiplanar, multiecho pulse sequences of the brain and  surrounding structures, and cervical spine, to include the craniocervical junction and cervicothoracic junction, were obtained without intravenous contrast.  Angiographic images of the intracranial venous structures were obtained using MRV technique without intravenous contrast.  COMPARISON:  Prior MRI from 07/01/2013 and CT from 08/02/2013.  FINDINGS: MRI HEAD FINDINGS  The study is limited by motion artifact.  Mild age-related atrophy with chronic small vessel ischemic changes again noted, unchanged relative to prior. No midline shift or extra-axial fluid collection. Ventricles are normal in size without evidence of hydrocephalus.  No diffusion-weighted signal abnormality is identified to suggest acute intracranial infarct. Gray-white matter differentiation is maintained. Normal flow voids are seen within the intracranial vasculature. No intracranial hemorrhage identified.  The cervicomedullary junction is normal. Pituitary gland is within normal limits. Pituitary stalk is midline. The globes and optic nerves demonstrate a normal appearance with normal signal intensity.  Again seen is a in destructive soft tissue mass involving the posterior nasopharynx extending posteriorly to invade the clivus. This lesion measures approximately 2.9 cm, not significantly changed relative to the previous examination. There is involvement of the left petrous apex with invasion of the left hypoglossal canal. There is trace fluid signal intensity within the left mastoid air cells, improved from prior. Normal flow voids are seen within the distal cervical and petrous segments of the left internal carotid artery which are encased  by this mass.  Calvarium is intact. Visualized upper cervical spine is within normal limits.  Scalp soft tissues are unremarkable.  Paranasal sinuses are clear.  No mastoid effusion.  MRV HEAD FINDINGS:  Dedicated MRV sequences demonstrate an attenuated left transverse sinus. There is question of minimal flow  within the proximal left transverse sinus. Distally, the left transverse sinus, sigmoid sinus, and jugular vein are not visualized. Small amount of hyperintense precontrast T1 signal intensity seen within the proximal left sigmoid sinus is suggestive of thrombus (series 11, image 70). These findings may be secondary to compression from the posterior nasopharyngeal mass and/or venous sinus thrombosis.  The superior sagittal and straight sinuses as well as the deep venous veins are widely patent. The right transverse sinus, sigmoid sinus, and jugular vein are widely patent.  MRI CERVICAL SPINE FINDINGS  The study is degraded by motion artifact.  The craniocervical junction is widely patent. The vertebral bodies are normally aligned with preservation of the normal cervical lordosis. No listhesis. Vertebral body heights are preserved. Signal intensity within the vertebral body bone marrow and spinal cord is normal.  At C2-3, there is diffuse degenerative disc osteophyte with right-sided facet arthrosis and uncovertebral osteophytosis. There is resultant moderate right foraminal stenosis. No central canal or significant left neural foraminal narrowing identified.  At C3-4, there is broad-based degenerative disc osteophyte with bilateral facet arthrosis and uncovertebral osteophytosis. Posterior disc osteophyte mildly flattens the ventral thecal sac without significant canal stenosis. There is moderate right with mild left foraminal stenosis.  At C4-5, there is broad-based degenerative disc osteophyte with bilateral facet arthrosis, right greater than left. There is mild right foraminal stenosis. Posterior disc osteophyte mildly flattens the ventral thecal sac resulting in mild canal stenosis.  At C5-6, there is broad-based degenerative disc osteophyte with bilateral facet arthrosis and uncovertebral osteophytosis. Posterior disc osteophyte flattens and partially effaces the ventral thecal sac and results in moderate  canal stenosis. There is moderate left with mild right foraminal narrowing.  At C6-7, evaluation limited by motion artifact. There is broad-based degenerative disc osteophyte with bilateral facet arthrosis. There is at least mild bilateral foraminal narrowing, slightly worse on the left. There is mild central canal stenosis.  At C7-T1, no canal or neural foraminal narrowing identified.  The visualized soft tissues of the neck are within normal limits.  IMPRESSION: MRI HEAD:  1. 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 The soft tissue mass invades the clivus posteriorly. Trace left mastoid fluid is present, improved relative to prior exam. 2. No acute intracranial infarct or other abnormality identified. 3. Mild cerebral atrophy, unchanged.  MRV HEAD:  1. Absence of flow within the left sigmoid sinus and left jugular vein with small amount of hyperintense precontrast T1 signal intensity seen within the proximal left sigmoid sinus, most consistent with thrombus. It is unclear how much of the absence of flow within this region is related to complete thrombosis or compression from the posterior nasopharyngeal mass. The left transverse sinus is attenuated and not well visualized. 2. Otherwise normal appearance of remaining venous sinuses within the brain without evidence of venous sinus thrombosis or other abnormality.  MRI CERVICAL SPINE:  1. Broad-based degenerative disc osteophyte at C5-6 resulting in moderate canal and left foraminal stenosis with mild right foraminal narrowing, similar to prior. 2. Degenerative disc osteophyte at C6-7 with resultant mild canal and mild bilateral  foraminal stenosis, similar to prior. 3. Degenerative disc osteophyte at C3-4 with resultant moderate right with mild left foraminal narrowing, similar to prior. 4. Mild canal and mild right  foraminal stenosis at C4-5, similar to prior. 5. Additional multilevel degenerative disc disease as above.   Electronically Signed   By: Jeannine Boga M.D.   On: 08/08/2013 23:03   Mr Mrv Head Wo Cm  08/08/2013   CLINICAL DATA:  History of osteomyelitis at the skullbase.  EXAM: MRI HEAD WITHOUT CONTRAST  MRV HEAD WITHOUT CONTRAST  MRI CERVICAL SPINE WITHOUT CONTRAST  TECHNIQUE: Multiplanar, multiecho pulse sequences of the brain and surrounding structures, and cervical spine, to include the craniocervical junction and cervicothoracic junction, were obtained without intravenous contrast.  Angiographic images of the intracranial venous structures were obtained using MRV technique without intravenous contrast.  COMPARISON:  Prior MRI from 07/01/2013 and CT from 08/02/2013.  FINDINGS: MRI HEAD FINDINGS  The study is limited by motion artifact.  Mild age-related atrophy with chronic small vessel ischemic changes again noted, unchanged relative to prior. No midline shift or extra-axial fluid collection. Ventricles are normal in size without evidence of hydrocephalus.  No diffusion-weighted signal abnormality is identified to suggest acute intracranial infarct. Gray-white matter differentiation is maintained. Normal flow voids are seen within the intracranial vasculature. No intracranial hemorrhage identified.  The cervicomedullary junction is normal. Pituitary gland is within normal limits. Pituitary stalk is midline. The globes and optic nerves demonstrate a normal appearance with normal signal intensity.  Again seen is a in destructive soft tissue mass involving the posterior nasopharynx extending posteriorly to invade the clivus. This lesion measures approximately 2.9 cm, not significantly changed relative to the previous examination. There is involvement of the left petrous apex with invasion of the left hypoglossal canal. There is trace fluid signal intensity within the left mastoid air cells, improved from  prior. Normal flow voids are seen within the distal cervical and petrous segments of the left internal carotid artery which are encased by this mass.  Calvarium is intact. Visualized upper cervical spine is within normal limits.  Scalp soft tissues are unremarkable.  Paranasal sinuses are clear.  No mastoid effusion.  MRV HEAD FINDINGS:  Dedicated MRV sequences demonstrate an attenuated left transverse sinus. There is question of minimal flow within the proximal left transverse sinus. Distally, the left transverse sinus, sigmoid sinus, and jugular vein are not visualized. Small amount of hyperintense precontrast T1 signal intensity seen within the proximal left sigmoid sinus is suggestive of thrombus (series 11, image 70). These findings may be secondary to compression from the posterior nasopharyngeal mass and/or venous sinus thrombosis.  The superior sagittal and straight sinuses as well as the deep venous veins are widely patent. The right transverse sinus, sigmoid sinus, and jugular vein are widely patent.  MRI CERVICAL SPINE FINDINGS  The study is degraded by motion artifact.  The craniocervical junction is widely patent. The vertebral bodies are normally aligned with preservation of the normal cervical lordosis. No listhesis. Vertebral body heights are preserved. Signal intensity within the vertebral body bone marrow and spinal cord is normal.  At C2-3, there is diffuse degenerative disc osteophyte with right-sided facet arthrosis and uncovertebral osteophytosis. There is resultant moderate right foraminal stenosis. No central canal or significant left neural foraminal narrowing identified.  At C3-4, there is broad-based degenerative disc osteophyte with bilateral facet arthrosis and uncovertebral osteophytosis. Posterior disc osteophyte mildly flattens the ventral thecal sac without significant canal stenosis. There is moderate right with  mild left foraminal stenosis.  At C4-5, there is broad-based  degenerative disc osteophyte with bilateral facet arthrosis, right greater than left. There is mild right foraminal stenosis. Posterior disc osteophyte mildly flattens the ventral thecal sac resulting in mild canal stenosis.  At C5-6, there is broad-based degenerative disc osteophyte with bilateral facet arthrosis and uncovertebral osteophytosis. Posterior disc osteophyte flattens and partially effaces the ventral thecal sac and results in moderate canal stenosis. There is moderate left with mild right foraminal narrowing.  At C6-7, evaluation limited by motion artifact. There is broad-based degenerative disc osteophyte with bilateral facet arthrosis. There is at least mild bilateral foraminal narrowing, slightly worse on the left. There is mild central canal stenosis.  At C7-T1, no canal or neural foraminal narrowing identified.  The visualized soft tissues of the neck are within normal limits.  IMPRESSION: MRI HEAD:  1. 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 The soft tissue mass invades the clivus posteriorly. Trace left mastoid fluid is present, improved relative to prior exam. 2. No acute intracranial infarct or other abnormality identified. 3. Mild cerebral atrophy, unchanged.  MRV HEAD:  1. Absence of flow within the left sigmoid sinus and left jugular vein with small amount of hyperintense precontrast T1 signal intensity seen within the proximal left sigmoid sinus, most consistent with thrombus. It is unclear how much of the absence of flow within this region is related to complete thrombosis or compression from the posterior nasopharyngeal mass. The left transverse sinus is attenuated and not well visualized. 2. Otherwise normal appearance of remaining venous sinuses within the brain without evidence of venous sinus thrombosis or other abnormality.  MRI CERVICAL  SPINE:  1. Broad-based degenerative disc osteophyte at C5-6 resulting in moderate canal and left foraminal stenosis with mild right foraminal narrowing, similar to prior. 2. Degenerative disc osteophyte at C6-7 with resultant mild canal and mild bilateral foraminal stenosis, similar to prior. 3. Degenerative disc osteophyte at C3-4 with resultant moderate right with mild left foraminal narrowing, similar to prior. 4. Mild canal and mild right foraminal stenosis at C4-5, similar to prior. 5. Additional multilevel degenerative disc disease as above.   Electronically Signed   By: Jeannine Boga M.D.   On: 08/08/2013 23:03   Ct Temporal Bones W/cm  08/02/2013   CLINICAL DATA:  Left hypoglossal neuropathy. Skull base osteomyelitis. Prior surgery  EXAM: CT TEMPORAL BONES WITH CONTRAST  TECHNIQUE: Axial and coronal plane CT imaging of the petrous temporal bones was performed with thin-collimation image reconstruction after intravenous contrast administration. Multiplanar CT image reconstructions were also generated.  CONTRAST:  25m OMNIPAQUE IOHEXOL 300 MG/ML  SOLN  COMPARISON:  CT sinus 07/02/2013  FINDINGS: Destructive process in the left skullbase, consistent with osteomyelitis which has been proven by tissue sampling in the operating room. There is erosion of the hypoglossal canal on the left consistent with a history of hypoglossal neuropathy. This is consistent with infection of the canal. There is also erosion of the left side of the clivus and left sphenoid bone. There is erosion of the wall of the carotid canal consistent with osteomyelitis. There is increased edema within the carotid canal and the left internal carotid artery could be infected. The left internal carotid artery appears patent. There are gas bubbles below the carotid canal which may be related to recent biopsy or could be related to the infection.  There  is edema surrounding the left jugular vein which may be narrowed or occluded. There is  filling defect in the left transverse sinus compatible with thrombus. Right transverse sinus is normal. The cavernous sinus is symmetric without evidence of venous thrombosis.  The left mastoid sinus is clear aside from some minimal mucosal edema in the mastoid tip. This has improved since the prior CT. Left middle ear is clear. The right mastoid sinus and middle ear are clear. Visualized paranasal sinuses reveal mild mucosal edema in the maxillary sinuses.  No intracranial abscess is identified on these limited images of the brain. If there is concern of cerebritis, MRI with contrast is suggested.  IMPRESSION: Destructive mass of the left skullbase consistent with osteomyelitis. There is erosion of the hypoglossal canal on the left, consistent with osteomyelitis and hypoglossal neuropathy. There is destruction of bone around the carotid canal on the left raising the possibility of carotid infection. There appears to be flow in the left internal carotid artery.  There is a small amount of thrombus in the left transverse sinus. Left jugular vein appears patent however there is considerable edema around the jugular bulb which may or may not be patent.  Bony destruction is similar to the prior CT.   Electronically Signed   By: Franchot Gallo M.D.   On: 08/02/2013 16:23    Micro Results      Recent Results (from the past 240 hour(s))  CULTURE, BLOOD (ROUTINE X 2)     Status: None   Collection Time    08/07/13  6:40 PM      Result Value Ref Range Status   Specimen Description BLOOD LEFT HAND   Final   Special Requests BOTTLES DRAWN AEROBIC AND ANAEROBIC 10CC   Final   Culture  Setup Time     Final   Value: 08/07/2013 22:06     Performed at [facility]   Culture     Final   Value:        BLOOD CULTURE RECEIVED NO GROWTH TO DATE CULTURE WILL BE HELD FOR 5 DAYS BEFORE ISSUING A FINAL NEGATIVE REPORT     Performed at [facility]   Report Status PENDING   Incomplete  CULTURE, BLOOD  (ROUTINE X 2)     Status: None   Collection Time    08/07/13  6:55 PM      Result Value Ref Range Status   Specimen Description BLOOD LEFT ARM   Final   Special Requests BOTTLES DRAWN AEROBIC AND ANAEROBIC 10CC   Final   Culture  Setup Time     Final   Value: 08/07/2013 22:06     Performed at [facility]   Culture     Final   Value:        BLOOD CULTURE RECEIVED NO GROWTH TO DATE CULTURE WILL BE HELD FOR 5 DAYS BEFORE ISSUING A FINAL NEGATIVE REPORT     Performed at [facility]   Report Status PENDING   Incomplete     History of present illness and  Hospital Course:     Kindly see H&P for history of present illness and admission details, please review complete Labs, Consult reports and Test reports for all details in brief ROWLAND ERICSSON, is a 80 y.o. male, patient with history of  sinus infections, who initially presented to the ER around Christmas time in 2014 with dysarthria and dysphagia after a few weeks of left-sided headache. Head CT was  negative except for fullness of the nasopharynx. ESR was elevated. He was referred to ENT office of Dr. Redmond Baseman for tongue swelling and was found to have paralysis of the left side of the tongue and soft palate. Fiberoptic exam of the nasopharynx revealed fullness and inflammatory versus neoplastic changes. He was taken for biopsy which revealed only inflammatory tissue. Pain worsened dramatically after biopsy and he was admitted to the hospital on 07/01/13 due to intractable pain, malnutrition, and his other symptoms.     MR imaging was performed indicating an erosive mass of the left nasopharynx/skull base eroding the clivus and petrous apex. He was taken back to the OR for image guided biopsy and the nasopharynx was debrided of white gooey but fibrous tissue surrounded by inflammatory changes. Cultures and histology were taken. He was treated with Unasyn upon admission but Cipro was then added. Culture results ultimately grew Serratia  but gram stain showed GPCs. ID was consulted and he was changed to cefipime. At this point, he was being treated for what appeared to represent skull base osteomyelitis with inflammatory pseudotumor. A minimum of six weeks of IV cefipime was planned so a PICC line was placed. Headache pain improved quite well after nasopharyngeal debridement but worsened at one point. Nasal endoscopy with removal of crusting and drainage from the nasopharynx was performed at the bedside and pain subsequently improved.     Early in his hospital stay in January, a Panda tube was placed through the nose to assist in providing nutrition. Speech pathology worked with him and found he was safe to take a dysphagia 1 diet with thin liquids. He tried to maintain calorie intake without the Panda but was unable so an elective PEG was placed by general surgery. He resumed tube feeds via PEG tube. He also has been having C Spine pain for the last few months and has been seen by Dr Ellene Route for it.     He has been still doing IV cefepime at home given by his wife, however he continues to have generalized headaches, poor appetite, tongue weakness on the left side, dysphagia, was seen by Dr. Johnnye Sima infectious disease physician in the office where a CT scan of his head was evaluated suggestive of skull base osteomyelitis and he was referred to hospital admission for further treatment.     He was seen here by infectious disease physician Dr. Megan Salon, ENT physician Dr. Redmond Baseman in neurosurgeon Dr. 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   She will continue IV antibiotics and follow with ID and ENT in the outpatient setting.      Also has severe protein telemetry malnutrition for which protein supplementation will be continued via PEG tube, he has weakness of his tongue skull base osteomyelitis causing compression of the hypoglossal nerve, he's been seen by speech and placed on dysphagia 1 diet which he was already taking at home.     He seems to have underlying depression and currently on Remeron by PCP, we'll request PCP to monitor his depression symptoms. He's not suicidal homicidal  Today   Subjective:   Jason [name] today has no headache,no chest abdominal pain,no new weakness tingling or numbness, feels much better wants to go home today.   Objective:   Blood pressure 138/71, pulse 77, temperature 98.3 F (36.8 C), temperature source Oral, resp. rate 16, height 5' 2"  (1.575 m), weight 61.8 kg (136 lb 3.9 oz), SpO2 90.00%.   Intake/Output Summary (Last 24 hours) at 08/10/13 0834 Last data filed at 08/10/13 0534  Gross per 24 hour  Intake    610 ml  Output    975 ml  Net   -365 ml    Exam Awake Alert, Oriented *3, No new F.N deficits, flat affect [place].AT,PERRAL Supple Neck,No JVD, No cervical lymphadenopathy appriciated.  Symmetrical Chest wall movement, Good air movement bilaterally, CTAB RRR,No Gallops,Rubs or new Murmurs, No Parasternal Heave, PEG tube in place +ve B.Sounds, Abd Soft, Non tender, No organomegaly appriciated, No rebound -guarding or rigidity. No Cyanosis, Clubbing or edema, No new Rash or bruise  Data Review   CBC w Diff: Lab Results  Component Value Date   WBC 9.8 08/08/2013   WBC 8.9 01/31/2013   HGB 10.1* 08/08/2013   HGB 15.5 01/31/2013   HCT 30.5* 08/08/2013   HCT 47.5 01/31/2013   PLT 159 08/08/2013   LYMPHOPCT 4* 07/17/2013   MONOPCT 2* 07/17/2013   EOSPCT 1 07/17/2013   BASOPCT 0 07/17/2013    CMP: Lab Results  Component Value Date   NA 140  08/08/2013   K 4.3 08/08/2013   CL 105 08/08/2013   CO2 22 08/08/2013   BUN 23 08/08/2013   CREATININE 0.72 08/08/2013   PROT 6.1 07/17/2013   ALBUMIN 2.4* 07/17/2013   BILITOT 0.6 07/17/2013   ALKPHOS 87 07/17/2013   AST 14 07/17/2013   ALT 25 07/17/2013  .   Total Time in preparing paper work, data evaluation and todays exam - 35 minutes  Thurnell Lose M.D on 08/10/2013 at 8:34 AM  [place]  [phone]

## 2013-08-10 NOTE — Discharge Instructions (Signed)
Follow with Primary MD ARONSON,RICHARD A, MD in 7 days   Get CBC, CMP, checked 7 days by Primary MD and again as instructed by your Primary MD.     Activity: As tolerated with Full fall precautions use walker/cane & assistance as needed   Disposition Home    Diet: Dysphagia 1 by mouth with feeding assistance and aspiration precautions, continue diet via feeding tube as before  For Heart failure patients - Check your Weight same time everyday, if you gain over 2 pounds, or you develop in leg swelling, experience more shortness of breath or chest pain, call your Primary MD immediately. Follow Cardiac Low Salt Diet and 1.8 lit/day fluid restriction.   On your next visit with her primary care physician please Get Medicines reviewed and adjusted.  Please request your Prim.MD to go over all Hospital Tests and Procedure/Radiological results at the follow up, please get all Hospital records sent to your Prim MD by signing hospital release before you go home.   If you experience worsening of your admission symptoms, develop shortness of breath, life threatening emergency, suicidal or homicidal thoughts you must seek medical attention immediately by calling 911 or calling your MD immediately  if symptoms less severe.  You Must read complete instructions/literature along with all the possible adverse reactions/side effects for all the Medicines you take and that have been prescribed to you. Take any new Medicines after you have completely understood and accpet all the possible adverse reactions/side effects.   Do not drive and provide baby sitting services if your were admitted for syncope or siezures until you have seen by Primary MD or a Neurologist and advised to do so again.  Do not drive when taking Pain medications.    Do not take more than prescribed Pain, Sleep and Anxiety Medications  Special Instructions: If you have smoked or chewed Tobacco  in the last 2 yrs please stop smoking,  stop any regular Alcohol  and or any Recreational drug use.  Wear Seat belts while driving.   Please note  You were cared for by a hospitalist during your hospital stay. If you have any questions about your discharge medications or the care you received while you were in the hospital after you are discharged, you can call the unit and asked to speak with the hospitalist on call if the hospitalist that took care of you is not available. Once you are discharged, your primary care physician will handle any further medical issues. Please note that NO REFILLS for any discharge medications will be authorized once you are discharged, as it is imperative that you return to your primary care physician (or establish a relationship with a primary care physician if you do not have one) for your aftercare needs so that they can reassess your need for medications and monitor your lab values.

## 2013-08-10 NOTE — Progress Notes (Signed)
Pt given discharge instructions and PICC line deaccessed.  Denied any needs at this time.  Pt taken to discharge location via wheelchair.

## 2013-08-13 LAB — CULTURE, BLOOD (ROUTINE X 2)
CULTURE: NO GROWTH
Culture: NO GROWTH

## 2013-08-15 ENCOUNTER — Encounter: Payer: Self-pay | Admitting: Internal Medicine

## 2013-08-15 LAB — AFB CULTURE WITH SMEAR (NOT AT ARMC): Acid Fast Smear: NONE SEEN

## 2013-08-20 ENCOUNTER — Ambulatory Visit: Payer: Medicare Other | Admitting: Internal Medicine

## 2013-08-29 ENCOUNTER — Ambulatory Visit (INDEPENDENT_AMBULATORY_CARE_PROVIDER_SITE_OTHER): Payer: Medicare Other | Admitting: Internal Medicine

## 2013-08-29 ENCOUNTER — Encounter: Payer: Self-pay | Admitting: Internal Medicine

## 2013-08-29 VITALS — BP 125/68 | HR 75 | Temp 97.1°F | Wt 136.0 lb

## 2013-08-29 DIAGNOSIS — R630 Anorexia: Secondary | ICD-10-CM

## 2013-08-29 DIAGNOSIS — M869 Osteomyelitis, unspecified: Secondary | ICD-10-CM

## 2013-08-29 LAB — BASIC METABOLIC PANEL
BUN: 34 mg/dL — AB (ref 6–23)
CO2: 29 mEq/L (ref 19–32)
CREATININE: 0.97 mg/dL (ref 0.50–1.35)
Calcium: 9 mg/dL (ref 8.4–10.5)
Chloride: 104 mEq/L (ref 96–112)
GLUCOSE: 90 mg/dL (ref 70–99)
Potassium: 4.6 mEq/L (ref 3.5–5.3)
Sodium: 141 mEq/L (ref 135–145)

## 2013-08-29 LAB — SEDIMENTATION RATE: Sed Rate: 27 mm/hr — ABNORMAL HIGH (ref 0–16)

## 2013-08-29 LAB — C-REACTIVE PROTEIN: CRP: 0.8 mg/dL — ABNORMAL HIGH (ref <0.60)

## 2013-08-29 NOTE — Progress Notes (Signed)
Subjective:    Patient ID: Jason [name], male    DOB: [birth year], 80 y.o.   MRN: [id number]  HPI 80 yo M who underwent left nasal polypectomy in October 2014 complicated by severe left-sided headache  And left sided CN11-CN12 palsy leading to hospital admission on January 2015. CT scan showed some erosion of the left clivus and features of apex. Endoscopy revealed diffuse soft tissue inflammation and a biopsy showed nonspecific acute sinusitis. Gram stain of tissue showed gram-positive cocci in pairs and the culture grew Serratia. He had complicated hospitalization due to dysphagia, pain control, and deconditioning. He had peg placed for supplemental feeding and discharged on IV cefepime. He was seen in ID clinic on 1-29 and had completed 3 wks of antibiotic therapy at that time. He underwent repeat CT head on 08-02-13 showing: Destructive mass of the left skullbase consistent with osteomyelitis. There is erosion of the hypoglossal canal on the left, consistent with osteomyelitis and hypoglossal neuropathy.There is destruction of bone around the carotid canal on the left raising the possibility of carotid infection. There appears to be flow in the left internal carotid artery. There is a small amount of thrombus in the left transverse sinus. Left jugular vein appears patent however there is considerable edema around the jugular bulb which may or may not be patent. Bony destruction is similar to the prior CT.  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 He was admitted to the hospital from 2/11-2/14 to change IV therapy to vancomycin and meropenem due to possible worsening of MRI findings and determine if there is any need for anticoagulation of the transverse sinus thrombus. He now has nearly finished with 8 wk of IV antibiotics.  He is at home receiving IV antibiotics wihtout difficulty. He receives 2 cans of tube feeds 3 x per day and has very little appetite remaining. He denies  discomfort with tube feedings. His depression is improved. He is working with physical therapy and doing well with it. He still has intermittent left sided occipital headaches. He previously complained of dizziness that dissipated until this morning when he woke up feeling dizziness and decreased appetite. He denies feeling that the room is spinning. No n/v associated with dizziness  Current Outpatient Prescriptions on File Prior to Visit  Medication Sig Dispense Refill  . acetaminophen (TYLENOL) 325 MG tablet Take 650 mg by mouth every 6 (six) hours as needed.      Marland Kitchen acidophilus (RISAQUAD) CAPS capsule Take 1 capsule by mouth daily.  30 capsule  1  . betamethasone dipropionate (DIPROLENE) 0.05 % cream Apply 1 application topically daily as needed (itchy scalp).      . calcium carbonate (TUMS) 500 MG chewable tablet Chew 1 tablet by mouth daily as needed for indigestion or heartburn.      . meloxicam (MOBIC) 15 MG tablet Take 15 mg by mouth daily.      . mirtazapine (REMERON) 15 MG tablet Take 15 mg by mouth at bedtime.       . sodium chloride 0.9 % SOLN 100 mL with meropenem 1 G SOLR 1 g Inject 1 g into the vein every 12 (twelve) hours.  10 Syringe  0  . sodium chloride 0.9 % SOLN 100 mL with vancomycin 500 MG SOLR 500 mg Inject 500 mg into the vein every 12 (twelve) hours. Home health nurse to draw vancomycin levels and forward to home health pharmacy and ID office of Dr.Snyder  20 cartridge  0  .  traMADol (ULTRAM) 50 MG tablet Take 50 mg by mouth every 6 (six) hours as needed.       No current facility-administered medications on file prior to visit.   Active Ambulatory Problems    Diagnosis Date Noted  . Loss of weight 03/24/2013  . GERD (gastroesophageal reflux disease) 03/24/2013  . Protein-calorie malnutrition, severe 07/02/2013  . Acute sinusitis 07/25/2013  . 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 Hyperglycemia 07/25/2013  . Normocytic anemia 07/25/2013  . Dysphagia 07/25/2013  .  Osteomyelitis 08/07/2013   Resolved Ambulatory Problems    Diagnosis Date Noted  . Neck pain 07/01/2013   Past Medical History  Diagnosis Date  . Arthritis   . Insomnia   . PONV (postoperative nausea and vomiting)   . Hyperlipidemia   . Dizziness   . Joint pain   . Joint swelling   . Itchy skin   . H/O hiatal hernia   . Hemorrhoids   . Constipation   . History of colon polyps   . History of colitis   . Urinary frequency   . History of kidney stones   . Enlarged prostate   . Anxiety   . Depression   . Insomnia    Soc hx: lost his brother last week. Unable to go to his funeral.   Review of Systems + decrease appetite, + decrease energy, + dizziness x 1 day, + HA.  10 point ROS otherwise negative    Objective:   Physical Exam BP 125/68  Pulse 75  Temp(Src) 97.1 F (36.2 C) (Oral)  Wt 136 lb (61.689 kg) ortho statics BP normal Physical Exam  Constitutional: He is oriented to person, place, and time. He appears well-developed and well-nourished. No distress.  HENT:  Mouth/Throat: Oropharynx is clear and moist. No oropharyngeal exudate.  Cardiovascular: Normal rate, regular rhythm and normal heart sounds. Exam reveals no gallop and no friction rub.  No murmur heard.  Pulmonary/Chest: Effort normal and breath sounds normal. No respiratory distress. He has no wheezes.  Abdominal: Soft. Bowel sounds are normal. He exhibits no distension. There is no tenderness.  Lymphadenopathy:  He has no cervical adenopathy.  Neurological: He is alert and oriented to person, place, and time.  Skin: Skin is warm and dry. No rash noted. No erythema.  Psychiatric: He has a normal mood and affect. His behavior is normal.   Labs: Lab Results  Component Value Date   ESRSEDRATE 27* 08/29/2013   POCTSEDRATE 5 01/31/2013   Lab Results  Component Value Date   CRP 0.8* 08/29/2013        Assessment & Plan:  Serratia skull based osteo = currently on vanco and meropenem. Will have advanced  home health pull line. Will check cbc, bmp, sed rate and crp. Will switch to bactrim DS 2 tab BID.  Decreased appetite = will do a trial megace  Dizziness = orthostatic vitals are normal. Will not give meclizine since it may worsen symptoms. Asked them to call back if it persists > 1-2 days  rtc in 2 wks

## 2013-08-30 MED ORDER — SULFAMETHOXAZOLE-TMP DS 800-160 MG PO TABS: 2.0000 | ORAL_TABLET | Freq: Two times a day (BID) | ORAL | Status: DC

## 2013-08-30 MED ORDER — MEGESTROL ACETATE 625 MG/5ML PO SUSP: 625.0000 mg | Freq: Every day | ORAL | Status: DC

## 2013-09-03 ENCOUNTER — Telehealth: Payer: Self-pay | Admitting: *Deleted

## 2013-09-03 NOTE — Telephone Encounter (Signed)
Spoke with wife to restart nexium. She is going to report back to us on Thursday to see if it helps

## 2013-09-03 NOTE — Telephone Encounter (Signed)
Patient's wife called stating that the Bactrim is upsetting his stomach more that the previous antibiotic and wanted to know if he could restart the Nexium. It was discontinued when he was in the pharmacy and she was afraid it would interfere with his antibiotic. She said he is having a lot of acid reflux. Please advise Jason [name]

## 2013-09-12 ENCOUNTER — Ambulatory Visit (INDEPENDENT_AMBULATORY_CARE_PROVIDER_SITE_OTHER): Payer: Medicare Other | Admitting: Internal Medicine

## 2013-09-12 ENCOUNTER — Encounter: Payer: Self-pay | Admitting: Internal Medicine

## 2013-09-12 VITALS — BP 131/61 | HR 67 | Temp 97.2°F | Wt 138.0 lb

## 2013-09-12 DIAGNOSIS — M869 Osteomyelitis, unspecified: Secondary | ICD-10-CM

## 2013-09-12 LAB — CBC WITH DIFFERENTIAL/PLATELET
Basophils Absolute: 0.1 10*3/uL (ref 0.0–0.1)
Basophils Relative: 1 % (ref 0–1)
Eosinophils Absolute: 0.1 10*3/uL (ref 0.0–0.7)
Eosinophils Relative: 1 % (ref 0–5)
HCT: 34.4 % — ABNORMAL LOW (ref 39.0–52.0)
HEMOGLOBIN: 12.1 g/dL — AB (ref 13.0–17.0)
LYMPHS ABS: 2.3 10*3/uL (ref 0.7–4.0)
Lymphocytes Relative: 23 % (ref 12–46)
MCH: 29.2 pg (ref 26.0–34.0)
MCHC: 35.2 g/dL (ref 30.0–36.0)
MCV: 83.1 fL (ref 78.0–100.0)
MONOS PCT: 10 % (ref 3–12)
Monocytes Absolute: 1 10*3/uL (ref 0.1–1.0)
NEUTROS ABS: 6.4 10*3/uL (ref 1.7–7.7)
NEUTROS PCT: 65 % (ref 43–77)
PLATELETS: 283 10*3/uL (ref 150–400)
RBC: 4.14 MIL/uL — AB (ref 4.22–5.81)
RDW: 19 % — ABNORMAL HIGH (ref 11.5–15.5)
WBC: 9.8 10*3/uL (ref 4.0–10.5)

## 2013-09-12 LAB — COMPLETE METABOLIC PANEL WITH GFR
ALT: 18 U/L (ref 0–53)
AST: 16 U/L (ref 0–37)
Albumin: 4.1 g/dL (ref 3.5–5.2)
Alkaline Phosphatase: 58 U/L (ref 39–117)
BILIRUBIN TOTAL: 0.6 mg/dL (ref 0.2–1.2)
BUN: 43 mg/dL — AB (ref 6–23)
CO2: 20 mEq/L (ref 19–32)
Calcium: 9.6 mg/dL (ref 8.4–10.5)
Chloride: 104 mEq/L (ref 96–112)
Creat: 1.59 mg/dL — ABNORMAL HIGH (ref 0.50–1.35)
GFR, EST AFRICAN AMERICAN: 46 mL/min — AB
GFR, EST NON AFRICAN AMERICAN: 40 mL/min — AB
GLUCOSE: 59 mg/dL — AB (ref 70–99)
Potassium: 5.4 mEq/L — ABNORMAL HIGH (ref 3.5–5.3)
Sodium: 136 mEq/L (ref 135–145)
Total Protein: 7.6 g/dL (ref 6.0–8.3)

## 2013-09-13 ENCOUNTER — Telehealth: Payer: Self-pay | Admitting: Internal Medicine

## 2013-09-13 ENCOUNTER — Other Ambulatory Visit: Payer: Self-pay | Admitting: Internal Medicine

## 2013-09-13 DIAGNOSIS — N179 Acute kidney failure, unspecified: Secondary | ICD-10-CM

## 2013-09-13 NOTE — Progress Notes (Signed)
Subjective:    Patient ID: Jason [name], male    DOB: July 19, 1929, 80 y.o.   MRN: [id number]  HPI 80 yo M who underwent left nasal polypectomy in October 2014 complicated by severe left-sided headache And left sided CN11-CN12 palsy leading to hospital admission on January 2015. CT scan showed some erosion of the left clivus and features of apex. Endoscopy revealed diffuse soft tissue inflammation and a biopsy showed nonspecific acute sinusitis. Gram stain of tissue showed gram-positive cocci in pairs and the culture grew Serratia. He had complicated hospitalization due to dysphagia, pain control, and deconditioning. He had peg placed for supplemental feeding and discharged on IV cefepime. He was seen in ID clinic on 1-29 and had completed 3 wks of antibiotic therapy at that time. He underwent repeat CT head on 08-02-13 showing: Destructive mass of the left skullbase consistent with osteomyelitis. There is erosion of the hypoglossal canal on the left, consistent with osteomyelitis and hypoglossal neuropathy.There is destruction of bone around the carotid canal on the left raising the possibility of carotid infection. There appears to be flow in the left internal carotid artery. There is a small amount of thrombus in the left transverse sinus. Left jugular vein appears patent however there is considerable edema around the jugular bulb which may or may not be patent. Bony destruction is similar to the prior CT. 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 He was admitted to the hospital from 2/11-2/14 to change IV therapy to vancomycin and meropenem due to possible worsening of MRI findings and determine if there is any need for anticoagulation of the transverse sinus thrombus. He now has nearly finished with 8 wk of IV antibiotics.He was transitioned to high dose bactrim at completion of 8 wks of therapy. He now has been on it for roughly 2 wks in follow up.   He is doing much better. Only  needed 3 cans of tube feeds. He still has difficulty with swallowing and is only eating softer foods. He has gained 2 lb since we last saw him. Increased energy. Less depressed mood. He denies dizziness but still has some scalp tenderness at left occiput area  No fever, chills, nightsweats  Current Outpatient Prescriptions on File Prior to Visit  Medication Sig Dispense Refill  . acetaminophen (TYLENOL) 325 MG tablet Take 650 mg by mouth every 6 (six) hours as needed.      Marland Kitchen acidophilus (RISAQUAD) CAPS capsule Take 1 capsule by mouth daily.  30 capsule  1  . betamethasone dipropionate (DIPROLENE) 0.05 % cream Apply 1 application topically daily as needed (itchy scalp).      . calcium carbonate (TUMS) 500 MG chewable tablet Chew 1 tablet by mouth daily as needed for indigestion or heartburn.      . megestrol (MEGACE ES) 625 MG/5ML suspension Take 5 mLs (625 mg total) by mouth daily.  150 mL  0  . meloxicam (MOBIC) 15 MG tablet Take 15 mg by mouth daily.      . mirtazapine (REMERON) 15 MG tablet Take 15 mg by mouth at bedtime.       . sulfamethoxazole-trimethoprim (BACTRIM DS) 800-160 MG per tablet Take 2 tablets by mouth 2 (two) times daily.  120 tablet  2  . traMADol (ULTRAM) 50 MG tablet Take 50 mg by mouth every 6 (six) hours as needed.       No current facility-administered medications on file prior to visit.   Active Ambulatory Problems  Diagnosis Date Noted  . Loss of weight 03/24/2013  . GERD (gastroesophageal reflux disease) 03/24/2013  . Protein-calorie malnutrition, severe 07/02/2013  . Acute sinusitis 07/25/2013  . 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 Hyperglycemia 07/25/2013  . Normocytic anemia 07/25/2013  . Dysphagia 07/25/2013  . Osteomyelitis 08/07/2013   Resolved Ambulatory Problems    Diagnosis Date Noted  . Neck pain 07/01/2013   Past Medical History  Diagnosis Date  . Arthritis   . Insomnia   . PONV (postoperative nausea and vomiting)   . Hyperlipidemia     . Dizziness   . Joint pain   . Joint swelling   . Itchy skin   . H/O hiatal hernia   . Hemorrhoids   . Constipation   . History of colon polyps   . History of colitis   . Urinary frequency   . History of kidney stones   . Enlarged prostate   . Anxiety   . Depression   . Insomnia    No change in social or family hx since last seen   Review of Systems 10 point ros is negative except still has occ headache. His baseline weight is 145-150    Objective:   Physical Exam BP 131/61  Pulse 67  Temp(Src) 97.2 F (36.2 C) (Oral)  Wt 138 lb (62.596 kg) Physical Exam  Constitutional: He is oriented to person, place, and time. He appears well-developed and well-nourished. No distress. Appears stated age HENT:   Mouth/Throat: Oropharynx is clear and moist. No oropharyngeal exudate. Cardiovascular: Normal rate, regular rhythm and normal heart sounds. Exam reveals no gallop and no friction rub.  No murmur heard.  Pulmonary/Chest: Effort normal and breath sounds normal. No respiratory distress. He has no wheezes.  Abdominal: Soft. Bowel sounds are normal. He exhibits no distension. There is no tenderness. nontender non erythamatous peg Lymphadenopathy:  no cervical adenopathy.  Neurological: He is alert and oriented to person, place, and time.  Skin: Skin is warm and dry. No rash noted. No erythema.  Psychiatric: He has a normal mood and affect. His behavior is normal.   Labs: BMET    Component Value Date/Time   NA 136 09/12/2013 1127   K 5.4* 09/12/2013 1127   CL 104 09/12/2013 1127   CO2 20 09/12/2013 1127   GLUCOSE 59* 09/12/2013 1127   BUN 43* 09/12/2013 1127   CREATININE 1.59* 09/12/2013 1127   CREATININE 0.72 08/08/2013 0640   CALCIUM 9.6 09/12/2013 1127   GFRNONAA 85* 08/08/2013 0640   GFRAA >90 08/08/2013 0640       Assessment & Plan:  Serratia skull based osteo = will decrease bactrim DS 1 tab BID since he is having some AKI likely due to bactrim, but also possibly less  water intake since he has decreased his tube feeds from 6 cans to 3 cans.  aki = will decrease bactrim dose. Also will ask his wife to give 125mL x 3-4 per day for the next few days  Protein calorie malnutrition - starting to have weight gain. Encourage to continue good work. Not to phase out tube feeds too quickly since we want him to get back to BL weight of 145-150  Will repeat bmp next week to see that it is improving.

## 2013-09-13 NOTE — Telephone Encounter (Signed)
Based upon lab results from yesterday, we will decrease his bactrim dose to 1 DS BID. Recheck his labs in next 10 days

## 2013-09-17 ENCOUNTER — Other Ambulatory Visit: Payer: Medicare Other

## 2013-09-17 DIAGNOSIS — N179 Acute kidney failure, unspecified: Secondary | ICD-10-CM

## 2013-09-17 LAB — BASIC METABOLIC PANEL WITH GFR
BUN: 40 mg/dL — ABNORMAL HIGH (ref 6–23)
CO2: 22 mEq/L (ref 19–32)
Calcium: 9.5 mg/dL (ref 8.4–10.5)
Chloride: 104 mEq/L (ref 96–112)
Creat: 1.37 mg/dL — ABNORMAL HIGH (ref 0.50–1.35)
GFR, EST AFRICAN AMERICAN: 56 mL/min — AB
GFR, Est Non African American: 48 mL/min — ABNORMAL LOW
GLUCOSE: 76 mg/dL (ref 70–99)
POTASSIUM: 4.6 meq/L (ref 3.5–5.3)
Sodium: 136 mEq/L (ref 135–145)

## 2013-09-19 ENCOUNTER — Telehealth: Payer: Self-pay | Admitting: *Deleted

## 2013-09-19 NOTE — Telephone Encounter (Signed)
Patient called for lab results. Asked about his creatinine and glucose; please advise. Jason [name]

## 2013-09-22 NOTE — Telephone Encounter (Signed)
Let them know that his kidney function is improved and glucose level within normal range. Continue to have frequent meals

## 2013-09-23 NOTE — Telephone Encounter (Signed)
Patient notified

## 2013-10-07 ENCOUNTER — Telehealth (INDEPENDENT_AMBULATORY_CARE_PROVIDER_SITE_OTHER): Payer: Self-pay | Admitting: General Surgery

## 2013-10-07 ENCOUNTER — Encounter: Payer: Self-pay | Admitting: Internal Medicine

## 2013-10-07 NOTE — Telephone Encounter (Signed)
Pt's wife called with concern for husband's PEG/ feeding tube.  She notes new bloody drainage around the tube and in the tube.  Please advise if you want to see him in the office.

## 2013-10-08 NOTE — Telephone Encounter (Signed)
appt made for Friday

## 2013-10-11 ENCOUNTER — Ambulatory Visit (INDEPENDENT_AMBULATORY_CARE_PROVIDER_SITE_OTHER): Payer: Medicare Other | Admitting: General Surgery

## 2013-10-11 ENCOUNTER — Encounter (INDEPENDENT_AMBULATORY_CARE_PROVIDER_SITE_OTHER): Payer: Self-pay | Admitting: General Surgery

## 2013-10-11 VITALS — BP 132/70 | HR 80 | Temp 98.0°F | Resp 16 | Ht 63.0 in | Wt 151.4 lb

## 2013-10-11 DIAGNOSIS — Z931 Gastrostomy status: Secondary | ICD-10-CM | POA: Insufficient documentation

## 2013-10-11 NOTE — Progress Notes (Signed)
Subjective:     Patient ID: Jason SchultzeBrice A [name], male   DOB: Nov 04, 1929, 80 y.o.   MRN: [id number]  HPI The patient came in today for removal of percutaneous gastrostomy tube. There have been some redness around the site of some bleeding.  Review of Systems No fevers or chills. Did not use the G-tube for approximately 5 days.    Objective:   Physical Exam On the inferior margin of the gastrostomy tube insertion site there was some mild hyper granulation tissue. Silver nitrate sticks were used in this area after the gastrostomy tube was removed. Immediately after removal of the gastrostomy tube and there was a gush of gastric contents which subsided within the next few seconds. A pressure dressing of 4 x 4 gauze and Hypafix were placed on the wound. There was no continued drainage healing in the upright sitting position.  The patient's wife saw how I dressed the wound and we do so again in the future if it should continue to leak. She is to change the dressing tomorrow.    Assessment:     Status post removal of percutaneous gastrostomy tube.     Plan:     I will see the patient again in the future if he should continue to have leakage. If not he'll be discharged from clinic. The wife has an understanding of what to expect and will contact us if there is continued leakage or problems.

## 2013-10-16 NOTE — Progress Notes (Signed)
I saw that there was some drainage from the G-tube site.  I need to know how much and if it has continued.  Thanks.

## 2014-02-06 ENCOUNTER — Ambulatory Visit (INDEPENDENT_AMBULATORY_CARE_PROVIDER_SITE_OTHER): Payer: Medicare Other | Admitting: Emergency Medicine

## 2014-02-06 ENCOUNTER — Encounter (HOSPITAL_COMMUNITY): Payer: Self-pay | Admitting: Emergency Medicine

## 2014-02-06 ENCOUNTER — Emergency Department (HOSPITAL_COMMUNITY)
Admission: EM | Admit: 2014-02-06 | Discharge: 2014-02-06 | Disposition: A | Discharge status: Home / Self Care | Payer: Medicare Other | Attending: Emergency Medicine | Admitting: Emergency Medicine

## 2014-02-06 VITALS — BP 152/72 | HR 94 | Temp 98.0°F | Resp 18

## 2014-02-06 DIAGNOSIS — Z87448 Personal history of other diseases of urinary system: Secondary | ICD-10-CM | POA: Diagnosis not present

## 2014-02-06 DIAGNOSIS — T6391XA Toxic effect of contact with unspecified venomous animal, accidental (unintentional), initial encounter: Secondary | ICD-10-CM

## 2014-02-06 DIAGNOSIS — M129 Arthropathy, unspecified: Secondary | ICD-10-CM | POA: Insufficient documentation

## 2014-02-06 DIAGNOSIS — Z87442 Personal history of urinary calculi: Secondary | ICD-10-CM | POA: Insufficient documentation

## 2014-02-06 DIAGNOSIS — T63461A Toxic effect of venom of wasps, accidental (unintentional), initial encounter: Secondary | ICD-10-CM | POA: Insufficient documentation

## 2014-02-06 DIAGNOSIS — F3289 Other specified depressive episodes: Secondary | ICD-10-CM | POA: Insufficient documentation

## 2014-02-06 DIAGNOSIS — Z87891 Personal history of nicotine dependence: Secondary | ICD-10-CM | POA: Diagnosis not present

## 2014-02-06 DIAGNOSIS — L539 Erythematous condition, unspecified: Secondary | ICD-10-CM | POA: Diagnosis present

## 2014-02-06 DIAGNOSIS — K219 Gastro-esophageal reflux disease without esophagitis: Secondary | ICD-10-CM | POA: Insufficient documentation

## 2014-02-06 DIAGNOSIS — Z8679 Personal history of other diseases of the circulatory system: Secondary | ICD-10-CM | POA: Diagnosis not present

## 2014-02-06 DIAGNOSIS — Z860100 Personal history of colon polyps, unspecified: Secondary | ICD-10-CM | POA: Insufficient documentation

## 2014-02-06 DIAGNOSIS — G4700 Insomnia, unspecified: Secondary | ICD-10-CM | POA: Insufficient documentation

## 2014-02-06 DIAGNOSIS — F329 Major depressive disorder, single episode, unspecified: Secondary | ICD-10-CM | POA: Insufficient documentation

## 2014-02-06 DIAGNOSIS — Y93H2 Activity, gardening and landscaping: Secondary | ICD-10-CM | POA: Diagnosis not present

## 2014-02-06 DIAGNOSIS — Z862 Personal history of diseases of the blood and blood-forming organs and certain disorders involving the immune mechanism: Secondary | ICD-10-CM | POA: Diagnosis not present

## 2014-02-06 DIAGNOSIS — Z8639 Personal history of other endocrine, nutritional and metabolic disease: Secondary | ICD-10-CM | POA: Insufficient documentation

## 2014-02-06 DIAGNOSIS — IMO0002 Reserved for concepts with insufficient information to code with codable children: Secondary | ICD-10-CM | POA: Diagnosis not present

## 2014-02-06 DIAGNOSIS — Y9289 Other specified places as the place of occurrence of the external cause: Secondary | ICD-10-CM | POA: Insufficient documentation

## 2014-02-06 DIAGNOSIS — T63441A Toxic effect of venom of bees, accidental (unintentional), initial encounter: Secondary | ICD-10-CM

## 2014-02-06 DIAGNOSIS — Z79899 Other long term (current) drug therapy: Secondary | ICD-10-CM | POA: Insufficient documentation

## 2014-02-06 DIAGNOSIS — T7840XA Allergy, unspecified, initial encounter: Secondary | ICD-10-CM

## 2014-02-06 MED ORDER — METHYLPREDNISOLONE SODIUM SUCC 125 MG IJ SOLR
125.0000 mg | Freq: Once | INTRAMUSCULAR | Status: AC
  Administered 2014-02-06: 125 mg via INTRAVENOUS

## 2014-02-06 MED ORDER — DIPHENHYDRAMINE HCL 50 MG/ML IJ SOLN: 25.0000 mg | Freq: Once | INTRAMUSCULAR | Status: DC

## 2014-02-06 MED ORDER — RANITIDINE HCL 150 MG PO CAPS: 150.0000 mg | ORAL_CAPSULE | Freq: Every day | ORAL | Status: DC

## 2014-02-06 MED ORDER — PREDNISONE 20 MG PO TABS: 40.0000 mg | ORAL_TABLET | Freq: Every day | ORAL | Status: DC

## 2014-02-06 MED ORDER — DIPHENHYDRAMINE HCL 50 MG/ML IJ SOLN
25.0000 mg | Freq: Once | INTRAMUSCULAR | Status: AC
  Administered 2014-02-06: 25 mg via INTRAMUSCULAR

## 2014-02-06 NOTE — ED Provider Notes (Signed)
CSN: [id number]     Arrival date & time 02/06/14  1313 History   First MD Initiated Contact with Patient 02/06/14 1328     Chief Complaint  Patient presents with  . Insect Bite     (Consider location/radiation/quality/duration/timing/severity/associated sxs/prior Treatment) HPI Jason [name] is a 80 y.o. male who reports today from [facility] center after having an allergic reaction to bee stings. He says he was working outside trimming bushes when he was suddenly stung 5-6 times by yellow jackets. He said he had a reaction years ago to yellow jackets and his tongue swoll up. This time, however, he just began to itch and break out. He denies having any respiratory difficulties, no tongue swelling, and no GI complaints.  He reports he was treated with Benadryl, Zyrtec, Zantac and Solumedrol at the [facility] center and it was recommended he come to the ED for further evaluation. He denies, fevers, cp, SOB, myalgias. Past Medical History  Diagnosis Date  . Arthritis   . Insomnia     takes Trazodone nightly  . PONV (postoperative nausea and vomiting)   . Hyperlipidemia     was on medication but has been off for a while  . Dizziness     was taking Meclizine but doesn't take now;found out that HR was 45  . Joint pain   . Joint swelling   . Itchy skin     scalp and uses a cream  . H/O hiatal hernia     takes Omeprazole daily  . Hemorrhoids   . Constipation   . History of colon polyps   . History of colitis     many yrs ago  . Urinary frequency   . History of kidney stones     passed on his own  . Enlarged prostate   . Anxiety     takes Diazepam daily prn  . Depression     takes Trazodone nightly  . Insomnia     takes Trazodone nightly  . GERD (gastroesophageal reflux disease)   . Osteomyelitis of skull 07/2013   Past Surgical History  Procedure Laterality Date  . Right knee arthroscopy    . Hernia repair      double  . Hemorrhoidectomy with hemorrhoid banding    . Tonsillectomy    .  Bilateral cataract surgery    . Circumcision    . Colonoscopy    . Esophagogastroduodenoscopy    . Ear cyst excision N/A 04/05/2013    Procedure: CYST REMOVAL;  Surgeon: Melvenia Beam, MD;  Location: [facility] OR;  Service: ENT;  Laterality: N/A;  . Septoplasty N/A 04/05/2013    Procedure: SEPTOPLASTY;  Surgeon: Melvenia Beam, MD;  Location: [facility] OR;  Service: ENT;  Laterality: N/A;  . Direct laryngoscopy N/A 06/26/2013    Procedure: DIRECT LARYNGOSCOPY;  Surgeon: Christia Reading, MD;  Location: [facility] OR;  Service: ENT;  Laterality: N/A;  . Sinus endo w/fusion N/A 07/03/2013    Procedure: ENDOSCOPIC SINUS SURGERY WITH FUSION NAVIGATION with By-Nasopharongeal Biopsy;  Surgeon: Christia Reading, MD;  Location: [facility] OR;  Service: ENT;  Laterality: N/A;  . Sinus exploration Bilateral 07/11/2013    Procedure:  BEDSIDE  SINUS EXPLORATION ;  Surgeon: Christia Reading, MD;  Location: [facility] OR;  Service: ENT;  Laterality: Bilateral;  . Esophagogastroduodenoscopy N/A 07/15/2013    Procedure: ESOPHAGOGASTRODUODENOSCOPY (EGD);  Surgeon: Cherylynn Ridges, MD;  Location: [facility] ENDOSCOPY;  Service: General;  Laterality: N/A;  . Peg placement N/A 07/15/2013    Procedure:  PERCUTANEOUS ENDOSCOPIC GASTROSTOMY (PEG) PLACEMENT;  Surgeon: Cherylynn Ridges, MD;  Location: [facility] ENDOSCOPY;  Service: General;  Laterality: N/A;  . Peripherally inserted central catheter insertion     No family history on file. History  Substance Use Topics  . Smoking status: Former Smoker    Quit date: 07/01/1993  . Smokeless tobacco: Former Neurosurgeon    Types: Chew    Quit date: 12/29/2012     Comment: quit chewing tobacco several months ago and stopped smoking cigars yrs ago  . Alcohol Use: No    Review of Systems  Constitutional: Negative for fever and fatigue.  HENT: Negative for congestion.   Eyes: Negative for visual disturbance.  Respiratory: Negative for chest tightness, shortness of breath and wheezing.   Cardiovascular: Negative for chest pain and palpitations.   Gastrointestinal: Negative for nausea, vomiting and abdominal pain.  Genitourinary: Negative for dysuria.  Musculoskeletal: Negative for myalgias.  Skin:       Reports mild redness and swelling localized to bee sting sites  Neurological: Negative for dizziness, numbness and headaches.      Allergies  Bee pollen; Tetanus toxoids; and Hydrocodone  Home Medications   Prior to Admission medications   Medication Sig Start Date End Date Taking? Authorizing Provider  acetaminophen (TYLENOL) 325 MG tablet Take 650 mg by mouth every 6 (six) hours as needed.   Yes Historical Provider, MD  calcium carbonate (TUMS) 500 MG chewable tablet Chew 1 tablet by mouth daily as needed for indigestion or heartburn.   Yes Historical Provider, MD  fluticasone (FLONASE) 50 MCG/ACT nasal spray Place 2 sprays into both nostrils daily.  09/17/13  Yes Historical Provider, MD  predniSONE (DELTASONE) 20 MG tablet Take 2 tablets (40 mg total) by mouth daily. 02/06/14   Earle Gell Joanne Salah, PA-C  ranitidine (ZANTAC) 150 MG capsule Take 1 capsule (150 mg total) by mouth daily. 02/06/14   Earle Gell Janos Shampine, PA-C   BP 124/62  Pulse 65  Temp(Src) 98.1 F (36.7 C) (Oral)  Resp 16  Ht 5\' 5"  (1.651 m)  Wt 150 lb (68.04 kg)  BMI 24.96 kg/m2  SpO2 98% Physical Exam  Nursing note and vitals reviewed. Constitutional: He is oriented to person, place, and time. He appears well-developed and well-nourished. No distress.  HENT:  Head: Normocephalic and atraumatic.  Mouth/Throat: Oropharynx is clear and moist.  No sign of swelling. Handling secretions well. Patent Airway  Eyes: Conjunctivae and EOM are normal. Right eye exhibits no discharge. Left eye exhibits no discharge.  Neck: Normal range of motion. Neck supple.  Cardiovascular: Normal rate, regular rhythm and normal heart sounds.   Pulmonary/Chest: Effort normal and breath sounds normal. No respiratory distress. He has no wheezes.  Abdominal: Soft. Bowel sounds are  normal. He exhibits no distension. There is no tenderness.  Musculoskeletal: Normal range of motion. He exhibits no edema and no tenderness.  Neurological: He is alert and oriented to person, place, and time.  Skin: Skin is warm and dry.  4-5 bee stings over arms, shoulders and posterior neck with local erythema and swelling. No other obvious lesions or abnormalities appreciated.  Psychiatric: He has a normal mood and affect.    ED Course  Procedures (including critical care time) Labs Review Labs Reviewed - No data to display  Imaging Review No results found.   EKG Interpretation None     Pt resting comfortably in ED- Kept 3 hrs for observation While on ECG monitor, pt had a brief sinus pause.  I was present at bedside during this event and the patient denied any syncope, visual changes, palpitations or SOB. He was unaware of the pause. He denied having had any of these symptoms at home. This rhythm was captured and it was decided to perform a 12-lead. 12-lead revealed no abnormalities and it was determined NSR. A paper copy of the recorded pause was given to pt for him to take to his PCP during f/u. MDM  Pt resting comfortably in ED Vitals stable No signs of rebound allergic reaction. Pt DC with Prednisone taper, Zantac, pt states has benadryl and zyrtec at home. Discussed plan for f/u and return precautions  Final diagnoses:  Allergic reaction, initial encounter   Meds given in ED:  Medications - No data to display  Discharge Medication List as of 02/06/2014  4:02 PM     Prior to patient discharge, I discussed and reviewed this case with Dr.Kohut         Sharlene MottsBenjamin W Chanise Habeck, PA-C 02/06/14 1926

## 2014-02-06 NOTE — ED Notes (Signed)
Bed: [id number] Expected date: 02/06/14 Expected time: 12:59 PM Means of arrival: Ambulance Comments: Bee stings

## 2014-02-06 NOTE — ED Notes (Signed)
Pt continues to deny allergic symp, resting on stretcher, well appearing.  NAD.

## 2014-02-06 NOTE — Progress Notes (Signed)
   Subjective:    Patient ID: Jason SchultzeBrice A Budhu, male    DOB: 09-21-1929, 80 y.o.   MRN: [id number]  HPI patient presents with multiple stings to the extremities and trunk. He was working in the yard and received multiple yellow jacket stings. 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 He is diaphoretic stating he's been working outside his tongue is not swollen his throat is not swollen his lungs were clear abdomen soft skin exam reveals multiple sting-like areas right side of the neck left anterior neck both legs and the right arm and left arm       Assessment & Plan:  Patient presents with multiple insects things not anaphylactic. Because of his age I would be very hesitant to give epinephrine. He was treated with Benadryl Zyrtec Zantac and given 125 of Solu-Medrol IV. Patient transported to the hospital for evaluation and observation.

## 2014-02-06 NOTE — Discharge Instructions (Signed)
Allergies °Allergies may happen from anything your body is sensitive to. This may be food, medicines, pollens, chemicals, and nearly anything around you in everyday life that produces allergens. An allergen is anything that causes an allergy producing substance. Heredity is often a factor in causing these problems. This means you may have some of the same allergies as your parents. °Food allergies happen in all age groups. Food allergies are some of the most severe and life threatening. Some common food allergies are cow's milk, seafood, eggs, nuts, wheat, and soybeans. °SYMPTOMS  °· Swelling around the mouth. °· An itchy red rash or hives. °· Vomiting or diarrhea. °· Difficulty breathing. °SEVERE ALLERGIC REACTIONS ARE LIFE-THREATENING. °This reaction is called anaphylaxis. It can cause the mouth and throat to swell and cause difficulty with breathing and swallowing. In severe reactions only a trace amount of food (for example, peanut oil in a salad) may cause death within seconds. °Seasonal allergies occur in all age groups. These are seasonal because they usually occur during the same season every year. They may be a reaction to molds, grass pollens, or tree pollens. Other causes of problems are house dust mite allergens, pet dander, and mold spores. The symptoms often consist of nasal congestion, a runny itchy nose associated with sneezing, and tearing itchy eyes. There is often an associated itching of the mouth and ears. The problems happen when you come in contact with pollens and other allergens. Allergens are the particles in the air that the body reacts to with an allergic reaction. This causes you to release allergic antibodies. Through a chain of events, these eventually cause you to release histamine into the blood stream. Although it is meant to be protective to the body, it is this release that causes your discomfort. This is why you were given anti-histamines to feel better.  If you are unable to  pinpoint the offending allergen, it may be determined by skin or blood testing. Allergies cannot be cured but can be controlled with medicine. °Hay fever is a collection of all or some of the seasonal allergy problems. It may often be treated with simple over-the-counter medicine such as diphenhydramine. Take medicine as directed. Do not drink alcohol or drive while taking this medicine. Check with your caregiver or package insert for child dosages. °If these medicines are not effective, there are many new medicines your caregiver can prescribe. Stronger medicine such as nasal spray, eye drops, and corticosteroids may be used if the first things you try do not work well. Other treatments such as immunotherapy or desensitizing injections can be used if all else fails. Follow up with your caregiver if problems continue. These seasonal allergies are usually not life threatening. They are generally more of a nuisance that can often be handled using medicine. °HOME CARE INSTRUCTIONS  °· If unsure what causes a reaction, keep a diary of foods eaten and symptoms that follow. Avoid foods that cause reactions. °· If hives or rash are present: °¨ Take medicine as directed. °¨ You may use an over-the-counter antihistamine (diphenhydramine) for hives and itching as needed. °¨ Apply cold compresses (cloths) to the skin or take baths in cool water. Avoid hot baths or showers. Heat will make a rash and itching worse. °· If you are severely allergic: °¨ Following a treatment for a severe reaction, hospitalization is often required for closer follow-up. °¨ Wear a medic-alert bracelet or necklace stating the allergy. °¨ You and your family must learn how to give adrenaline or use   an anaphylaxis kit.  If you have had a severe reaction, always carry your anaphylaxis kit or EpiPen with you. Use this medicine as directed by your caregiver if a severe reaction is occurring. Failure to do so could have a fatal outcome. SEEK MEDICAL  CARE IF:  You suspect a food allergy. Symptoms generally happen within 30 minutes of eating a food.  Your symptoms have not gone away within 2 days or are getting worse.  You develop new symptoms.  You want to retest yourself or your child with a food or drink you think causes an allergic reaction. Never do this if an anaphylactic reaction to that food or drink has happened before. Only do this under the care of a caregiver. SEEK IMMEDIATE MEDICAL CARE IF:   You have difficulty breathing, are wheezing, or have a tight feeling in your chest or throat.  You have a swollen mouth, or you have hives, swelling, or itching all over your body.  You have had a severe reaction that has responded to your anaphylaxis kit or an EpiPen. These reactions may return when the medicine has worn off. These reactions should be considered life threatening. MAKE SURE YOU:   Understand these instructions.  Will watch your condition.  Will get help right away if you are not doing well or get worse. Document Released: 09/06/2002 Document Revised: 10/08/2012 Document Reviewed: 02/11/2008 [facility] Patient Information 2015 [place], [place]. This information is not intended to replace advice given to you by your health care provider. Make sure you discuss any questions you have with your health care provider.   Follow up with you primary care provider if your symptoms do not improve. Also you may give him the print out of you EKG reading here in the ED. Also you may follow up with an allergist for further allergy testing Prednisone taper: take as directed until gone Zantac: take as needed for itching and allergy symptoms You may combine the Zantac with Benadryl

## 2014-02-06 NOTE — ED Notes (Signed)
Initial Contact - pt A+Ox4, reports cutting bushes this AM and was stung multiple times by bees.  Pt denies allergic symptoms but reports "real bad reaction" when he was stung by one bee years ago.  Skin PWD with hives noted to bilat arms, R upper back, L side of neck and legs.  Speaking full sentences, rr even/un-lab, LSCTAB. Pt with mildly slurred speech and L sided tongue deviation, he reports this is his baseline after "a brain procedure" last year.  Pt denies difficulty clearing secretions or maintaining airway.  No tongue/throat/lips swelling noted.  MAEI, ice packs provided.  Placed to cardiac/02 monitor.  NAD.

## 2014-02-06 NOTE — ED Notes (Signed)
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  Speaking full sentences, LSCTAB.  Mild hives noted.    Given at urgent care: 50mg  Benadryl IM 125mg  Solumedrol IV 50mg  Zantac IV

## 2014-02-09 NOTE — ED Provider Notes (Signed)
Medical screening examination/treatment/procedure(s) were conducted as a shared visit with non-physician practitioner(s) and myself.  I personally evaluated the patient during the encounter.   EKG Interpretation   Date/Time:  Thursday February 06 2014 14:46:20 EDT Ventricular Rate:  66 PR Interval:  195 QRS Duration: 91 QT Interval:  403 QTC Calculation: 422 R Axis:   64 Text Interpretation:  Sinus rhythm Abnormal R-wave progression, early  transition ED PHYSICIAN INTERPRETATION AVAILABLE IN [facility]  Confirmed by TEST, Record ([id number]) on 02/08/2014 8:29:07 AM     83yM presenting after being stung by yellow jackets. Exam with localized reaction. Observed w/o progression of symptoms. Additionally, noted to have long sinus pause while on monitor. No complaints though. EKG unremarkable. No recent symptoms which I can attribute to dysrhythmia. I feel safe for DC.   Raeford RazorStephen Mishaal Lansdale, MD 02/09/14 [phone]

## 2014-02-10 ENCOUNTER — Encounter: Payer: Self-pay | Admitting: Emergency Medicine

## 2014-06-02 ENCOUNTER — Encounter: Payer: Self-pay | Admitting: Family Medicine

## 2014-06-02 DIAGNOSIS — R471 Dysarthria and anarthria: Secondary | ICD-10-CM | POA: Insufficient documentation

## 2014-08-13 ENCOUNTER — Ambulatory Visit (INDEPENDENT_AMBULATORY_CARE_PROVIDER_SITE_OTHER): Payer: Medicare Other | Admitting: Emergency Medicine

## 2014-08-13 ENCOUNTER — Ambulatory Visit (INDEPENDENT_AMBULATORY_CARE_PROVIDER_SITE_OTHER): Payer: Medicare Other

## 2014-08-13 VITALS — BP 132/56 | HR 62 | Temp 97.6°F | Resp 16 | Ht 64.0 in | Wt 159.2 lb

## 2014-08-13 DIAGNOSIS — M25512 Pain in left shoulder: Secondary | ICD-10-CM

## 2014-08-13 DIAGNOSIS — M25522 Pain in left elbow: Secondary | ICD-10-CM

## 2014-08-13 NOTE — Progress Notes (Signed)
   Subjective:    Patient ID: Jason [name], male    DOB: 10-Mar-1930, 81 y.o.   MRN: [id number]  This chart was scribed for Lucilla EdinSteve A Louise Victory, MD by Ronney LionSuzanne Le, ED Scribe. This patient was seen in room 10 and the patient's care was started at 10:52 AM.   Chief Complaint  Patient presents with  . Elbow Injury    HPI  HPI Comments: Jason [name] is a 81 y.o. male who presents to the [facility] complaining of a left elbow injury that occurred ~1 month that he re-injured last week. Patient was carrying wood on January 8th, when he tripped and fell forward, striking his left elbow; about 30 to 45 minutes later, his elbow was swollen. Patient then struck his elbow on a dining room chair about 1 week ago that caused ongoing severe, shooting, non-radiating pain since. He reports no swelling or discoloration to the area.   Patient also mentions having speech difficulty and being unable to taste or chew, due to atrophy of his left-sided tongue.     Review of Systems  Constitutional: Negative for fatigue and unexpected weight change.  Eyes: Negative for visual disturbance.  Respiratory: Negative for cough, chest tightness and shortness of breath.   Cardiovascular: Negative for chest pain, palpitations and leg swelling.  Gastrointestinal: Negative for abdominal pain and blood in stool.  Musculoskeletal: Positive for arthralgias. Negative for joint swelling.  Skin: Negative for color change and wound.  Neurological: Positive for speech difficulty. Negative for dizziness, light-headedness and headaches.       Objective:   Physical Exam  Nursing note and vitals reviewed.  CONSTITUTIONAL: Well developed/well nourished HEAD: Normocephalic/atraumatic EYES: EOMI/PERRL ENMT: Mucous membranes moist NECK: supple no meningeal signs SPINE/BACK:entire spine nontender CV: S1/S2 noted, no murmurs/rubs/gallops noted LUNGS: Lungs are clear to auscultation bilaterally, no apparent  distress ABDOMEN: soft, nontender, no rebound or guarding, bowel sounds noted throughout abdomen GU:no cva tenderness NEURO: Pt is awake/alert/appropriate, moves all extremitiesx4.  No facial droop. Atrophy of the left side of his tongue, with somewhat garbled speech.   EXTREMITIES: pulses normal/equal, full ROM. Superior left shoulder tenderness with pain on abduction. Mild tenderness to the left AC joint. Very tender over the left olecranon, but he has good strength of his left arm. SKIN: warm, color normal PSYCH: no abnormalities of mood noted, alert and oriented to situation   [facility]: There are degenerative changes of the shoulder. The elbow itself looks normal. PA chest x-ray shows aortic knob calcifications otherwise normal         Assessment & Plan:  I suspect patient has had a deep bone bruise. I did not see a definite fracture. Patient advised to keep protection over his elbow and take Tylenol for pain.I personally performed the services described in this documentation, which was scribed in my presence. The recorded information has been reviewed and is accurate.I personally performed the services described in this documentation, which was scribed in my presence. The recorded information has been reviewed and is accurate.

## 2014-09-29 ENCOUNTER — Ambulatory Visit (INDEPENDENT_AMBULATORY_CARE_PROVIDER_SITE_OTHER): Payer: Medicare Other | Admitting: Internal Medicine

## 2014-09-29 VITALS — BP 154/78 | HR 60 | Temp 97.3°F | Resp 16 | Ht 65.0 in | Wt 158.6 lb

## 2014-09-29 DIAGNOSIS — M542 Cervicalgia: Secondary | ICD-10-CM | POA: Diagnosis not present

## 2014-09-29 DIAGNOSIS — M5030 Other cervical disc degeneration, unspecified cervical region: Secondary | ICD-10-CM | POA: Diagnosis not present

## 2014-09-29 DIAGNOSIS — W57XXXA Bitten or stung by nonvenomous insect and other nonvenomous arthropods, initial encounter: Secondary | ICD-10-CM | POA: Diagnosis not present

## 2014-09-29 MED ORDER — DOXYCYCLINE HYCLATE 100 MG PO TABS: 100.0000 mg | ORAL_TABLET | Freq: Two times a day (BID) | ORAL | Status: DC

## 2014-09-29 MED ORDER — CYCLOBENZAPRINE HCL 5 MG PO TABS: 5.0000 mg | ORAL_TABLET | Freq: Every day | ORAL | Status: DC

## 2014-09-29 NOTE — Progress Notes (Signed)
Subjective:  This chart was scribed for Jason Sia, MD by [facility], medical scribe at [facility].The patient was seen in exam room 14 and the patient's care was started at 5:50 PM.   Patient ID: Jason [name], male    DOB: [birth year], 81 y.o.   MRN: [id number] Chief Complaint  Patient presents with  . Tick Removal  . Neck Pain   HPI HPI Comments: Jason [name] is a 81 y.o. male who presents to [facility] complaining of a tick bite, onset 3-4 days ago. The area is red, warm and painful to the touch. He had this tick bite while He was out of town in [place]. No fever.   He also complains of intermittent neck pain that radiates down to his right shoulder blade. His neck pops when he turns but is not painful. He has stiffness chronically and has prior degenerative changes identified per MRI. 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 In the aftermath he has dysarthria and occasional problems swallowing but otherwise survived incredibly. His neck has bothered him ever since but he is reluctant consider surgery. He is now annoyed because the travel made his neck worse and he has been unable to sleep. He has no weakness in his upper extremities and no numbness. He denies fever, no new trouble swallowing.  Patient Active Problem List   Diagnosis Date Noted  . Dysarthria 06/02/2014  . 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 Osteomyelitis 08/07/2013  . Acute sinusitis 07/25/2013  . 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 Hyperglycemia 07/25/2013  . Normocytic anemia 07/25/2013  . Dysphagia 07/25/2013  . Protein-calorie malnutrition, severe 07/02/2013  . Loss of weight 03/24/2013  . GERD (gastroesophageal reflux disease) 03/24/2013   Past Medical History  Diagnosis Date  . Arthritis   .  Insomnia     takes Trazodone nightly  . PONV (postoperative nausea and vomiting)   . Hyperlipidemia     was on medication but has been off for a while  . Dizziness     was taking Meclizine but doesn't take now;found out that HR was 45  . Joint pain   . Joint swelling   . Itchy skin     scalp and uses a cream  . H/O hiatal hernia     takes Omeprazole daily  . Hemorrhoids   . Constipation   . History of colon polyps   . History of colitis     many yrs ago  . Urinary frequency   . History of kidney stones     passed on his own  . Enlarged prostate   . Anxiety     takes Diazepam daily prn  . Depression     takes Trazodone nightly  . Insomnia     takes Trazodone nightly  . GERD (gastroesophageal reflux disease)   . Osteomyelitis of skull 07/2013   Past Surgical History  Procedure Laterality Date  . Right knee arthroscopy    . Hernia repair      double  . Hemorrhoidectomy with hemorrhoid banding    . Tonsillectomy    . Bilateral cataract surgery    . Circumcision    . Colonoscopy    . Esophagogastroduodenoscopy    . Ear cyst excision N/A 04/05/2013    Procedure: CYST REMOVAL;  Surgeon: Melvenia BeamMitchell Gore, MD;  [facility];  Service: ENT;  Laterality: N/A;  . Septoplasty N/A 04/05/2013    Procedure: SEPTOPLASTY;  Surgeon: Melvenia BeamMitchell Gore, MD;  [facility];  Service: ENT;  Laterality: N/A;  . Direct laryngoscopy N/A 06/26/2013    Procedure: DIRECT LARYNGOSCOPY;  Surgeon: Christia Readingwight Bates, MD;  [facility];  Service: ENT;  Laterality: N/A;  . Sinus endo w/fusion N/A 07/03/2013    Procedure: ENDOSCOPIC SINUS SURGERY WITH FUSION NAVIGATION with By-Nasopharongeal Biopsy;  Surgeon: Christia Readingwight Bates, MD;  [facility];  Service: ENT;  Laterality: N/A;  . Sinus exploration Bilateral 07/11/2013    Procedure:  BEDSIDE  SINUS EXPLORATION ;  Surgeon: Christia Readingwight Bates, MD;  [facility];  Service: ENT;  Laterality: Bilateral;  . Esophagogastroduodenoscopy N/A 07/15/2013    Procedure:  ESOPHAGOGASTRODUODENOSCOPY (EGD);  Surgeon: Cherylynn RidgesJames O Wyatt, MD;  [facility];  Service: General;  Laterality: N/A;  . Peg placement N/A 07/15/2013    Procedure: PERCUTANEOUS ENDOSCOPIC GASTROSTOMY (PEG) PLACEMENT;  Surgeon: Cherylynn RidgesJames O Wyatt, MD;  [facility];  Service: General;  Laterality: N/A;  . Peripherally inserted central catheter insertion     Allergies  Allergen Reactions  . Bee Pollen Anaphylaxis  . Tetanus Toxoids Swelling    Tetanus Shot  . Hydrocodone Other (See Comments)    Confusion, "talking out of his head"   Prior to Admission medications   Not on File   Review of Systems  Musculoskeletal: Positive for arthralgias and neck pain.  Skin: Positive for color change.       Objective:  BP 154/78 mmHg  Pulse 60  Temp(Src) 97.3 F (36.3 C) (Oral)  Resp 16  Ht 5\' 5"  (1.651 m)  Wt 158 lb 9.6 oz (71.94 kg)  BMI 26.39 kg/m2  SpO2 95%  Physical Exam  Constitutional: He is oriented to person, place, and time. He appears well-developed and well-nourished. No distress.  HENT:  Head: Normocephalic and atraumatic.  Mouth/Throat: Oropharynx is clear and moist.  Eyes: EOM are normal. Pupils are equal, round, and reactive to light.  Neck: No thyromegaly present.  He lacks neck extension fully but has fairly good flexion with mild discomfort, and cut discomfort with rotation to the right but not the left. He is tender to palpation over the right posterior cervical area extending into the  parascapular border Shoulder has good range of motion No motor or sensory losses in the upper extremity  Cardiovascular: Normal rate.   Pulmonary/Chest: Effort normal. No respiratory distress.  Musculoskeletal: Normal range of motion. He exhibits no edema.  Lymphadenopathy:    He has no cervical adenopathy.  Neurological: He is alert and oriented to person, place, and time.  Skin: Skin is warm and dry.  On the left inner upper arm there is a 2 cm area of redness with a  central punctum that has a small scab. Slightly tender  Psychiatric: He has a normal mood and affect. His behavior is normal.  Nursing note and vitals reviewed.     Assessment & Plan:  Neck pain DDD (degenerative disc disease), cervical with radicular symptoms  He has oxycodone to take at home/will add 5 mg of Flexeril at bedtime/he is advised to wear cervical collar for  pain relief/he would like to avoid surgery if at all possible/he will except physiatry and I'll refer him to Dr. Yevette Edwardsumonski  for further evaluation Tick bite  His preference is to be covered with doxycycline Meds ordered this encounter  Medications  . cyclobenzaprine (FLEXERIL) 5 MG tablet    Sig: Take 1 tablet (5 mg total) by mouth at bedtime.    Dispense:  10 tablet    Refill:  0  . doxycycline (VIBRA-TABS) 100 MG tablet    Sig: Take 1 tablet (100 mg total) by mouth 2 (two) times daily. Do not lie down for 1 hour after meds    Dispense:  14 tablet    Refill:  0      I have completed the patient encounter in its entirety as documented by the scribe, with editing by me where necessary. Ayumi Wangerin P. Merla Riches, M.D.

## 2014-10-01 ENCOUNTER — Telehealth: Payer: Self-pay

## 2014-10-01 NOTE — Telephone Encounter (Signed)
Pt has an appt with [facility] on 10/13/14 at 1130 and he will be running out of pain meds before them  Please contact patient

## 2014-10-01 NOTE — Telephone Encounter (Signed)
Can we refill? 

## 2014-10-01 NOTE — Telephone Encounter (Signed)
yes

## 2014-10-06 NOTE — Telephone Encounter (Signed)
Pt has never been rx'd pain med from here before. Informed him he will need to RTC. Pt will come in.

## 2015-03-10 ENCOUNTER — Ambulatory Visit (INDEPENDENT_AMBULATORY_CARE_PROVIDER_SITE_OTHER): Payer: Medicare Other | Admitting: Internal Medicine

## 2015-03-10 VITALS — BP 110/66 | HR 55 | Temp 97.6°F | Resp 18 | Ht 64.5 in | Wt 154.0 lb

## 2015-03-10 DIAGNOSIS — Z23 Encounter for immunization: Secondary | ICD-10-CM | POA: Diagnosis not present

## 2015-03-10 DIAGNOSIS — B351 Tinea unguium: Secondary | ICD-10-CM

## 2015-03-10 DIAGNOSIS — S99922A Unspecified injury of left foot, initial encounter: Secondary | ICD-10-CM

## 2015-03-10 NOTE — Progress Notes (Signed)
I directly supervised and participated in the procedure and agree with the student's documentation.  Verbal consent obtained.  Digital block with 4 cc 2% lidocaine plain.   SP&D.   Entire nail lifted and removed.  Difficult to lift due to onychomycotic changes of nail plate. Xeroform placed.   Cleansed and dressed.

## 2015-03-10 NOTE — Patient Instructions (Addendum)
INGROWN TOENAIL . Keep area clean, dry and bandaged for 24 hours. . After 24 hours, remove outer bandage and leave yellow gauze in place. Nuala Alpha toe/foot in warm soapy water for 5-10 minutes, once daily for 5 days. Rebandage toe after each cleaning. . Continue soaks until yellow gauze falls off. . Notify the office if you experience any of the following signs of infection: Swelling, redness, pus drainage, streaking, fever > 101.0 F      Influenza Virus Vaccine injection (Fluarix) What is this medicine? INFLUENZA VIRUS VACCINE (in floo EN zuh VAHY ruhs vak SEEN) helps to reduce the risk of getting influenza also known as the flu. This medicine may be used for other purposes; ask your health care provider or pharmacist if you have questions. COMMON BRAND NAME(S): Fluarix, Fluzone What should I tell my health care provider before I take this medicine? They need to know if you have any of these conditions: -bleeding disorder like hemophilia -fever or infection -Guillain-Barre syndrome or other neurological problems -immune system problems -infection with the human immunodeficiency virus (HIV) or AIDS -low blood platelet counts -multiple sclerosis -an unusual or allergic reaction to influenza virus vaccine, eggs, chicken proteins, latex, gentamicin, other medicines, foods, dyes or preservatives -pregnant or trying to get pregnant -breast-feeding How should I use this medicine? This vaccine is for injection into a muscle. It is given by a health care professional. A copy of Vaccine Information Statements will be given before each vaccination. Read this sheet carefully each time. The sheet may change frequently. Talk to your pediatrician regarding the use of this medicine in children. Special care may be needed. Overdosage: If you think you have taken too much of this medicine contact a poison control center or emergency room at once. NOTE: This medicine is only for you. Do not share  this medicine with others. What if I miss a dose? This does not apply. What may interact with this medicine? -chemotherapy or radiation therapy -medicines that lower your immune system like etanercept, anakinra, infliximab, and adalimumab -medicines that treat or prevent blood clots like warfarin -phenytoin -steroid medicines like prednisone or cortisone -theophylline -vaccines This list may not describe all possible interactions. Give your health care provider a list of all the medicines, herbs, non-prescription drugs, or dietary supplements you use. Also tell them if you smoke, drink alcohol, or use illegal drugs. Some items may interact with your medicine. What should I watch for while using this medicine? Report any side effects that do not go away within 3 days to your doctor or health care professional. Call your health care provider if any unusual symptoms occur within 6 weeks of receiving this vaccine. You may still catch the flu, but the illness is not usually as bad. You cannot get the flu from the vaccine. The vaccine will not protect against colds or other illnesses that may cause fever. The vaccine is needed every year. What side effects may I notice from receiving this medicine? Side effects that you should report to your doctor or health care professional as soon as possible: -allergic reactions like skin rash, itching or hives, swelling of the face, lips, or tongue Side effects that usually do not require medical attention (report to your doctor or health care professional if they continue or are bothersome): -fever -headache -muscle aches and pains -pain, tenderness, redness, or swelling at site where injected -weak or tired This list may not describe all possible side effects. Call your doctor for medical advice  about side effects. You may report side effects to FDA at 1-800-FDA-1088. Where should I keep my medicine? This vaccine is only given in a clinic, pharmacy,  doctor's office, or other health care setting and will not be stored at home. NOTE: This sheet is a summary. It may not cover all possible information. If you have questions about this medicine, talk to your doctor, pharmacist, or health care provider.  2015, Elsevier/Gold Standard. (2008-01-09 09:30:40)

## 2015-03-10 NOTE — Progress Notes (Signed)
Verbal consent obtained. Digital block to left great toe with 4 cc 2% lidocaine without epinephrine. Left great toe prepped and draped. Nail lifted and removed. Very difficult to remove nail due to onychomycotic changes. Xeroform placed. Area was cleansed and dressed.

## 2015-03-10 NOTE — Progress Notes (Signed)
Subjective:  This chart was scribed for Jason Sia, MD by Stann Ore, Medical Scribe. This patient was seen in Room 6 and the patient's care was started at 6:35 PM.     Patient ID: Jason [name], male    DOB: [birth year], 81 y.o.   MRN: [id number] Chief Complaint  Patient presents with  . Toe Injury    left great toe noticed today   . Flu Vaccine    HPI Jason [name] is a 81 y.o. male who presents to [facility] complaining of an insect bite on his left great toe that was noticed today.  Earlier today, after playing golf, he took off his shoes and noticed that ht left great toe nail has peeled back. It didn't hurt or bother him at the time. He went home afterwards, and noticed that the area around the nail was red. Both great toe nails have fungi underneath-never treated.   He also received a flu shot today.   Patient Active Problem List   Diagnosis Date Noted  . Dysarthria 06/02/2014  . 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 Osteomyelitis 08/07/2013  . Acute sinusitis 07/25/2013  . 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 Hyperglycemia 07/25/2013  . Normocytic anemia 07/25/2013  . Dysphagia 07/25/2013  . Protein-calorie malnutrition, severe 07/02/2013  . Loss of weight 03/24/2013  . GERD (gastroesophageal reflux disease) 03/24/2013    Current outpatient prescriptions:  .  cyclobenzaprine (FLEXERIL) 5 MG tablet, Take 1 tablet (5 mg total) by mouth at bedtime. (Patient not taking: Reported on 03/10/2015), Disp: 10 tablet, Rfl: 0 .  doxycycline (VIBRA-TABS) 100 MG tablet, Take 1 tablet (100 mg total) by mouth 2 (two) times daily. Do not lie down for 1 hour after meds (Patient not taking: Reported on 03/10/2015), Disp: 14 tablet, Rfl: 0    Review of Systems  Constitutional: Negative for fever, chills, diaphoresis and fatigue.  Gastrointestinal: Negative for nausea, vomiting, diarrhea and constipation.  Musculoskeletal: Negative for gait problem.  Skin: Positive for rash (big  toe nail fungus bilaterally). Negative for wound.       Objective:   Physical Exam  Constitutional: He is oriented to person, place, and time. He appears well-developed and well-nourished. No distress.  HENT:  Head: Normocephalic and atraumatic.  Eyes: EOM are normal. Pupils are equal, round, and reactive to light.  Neck: Neck supple.  Cardiovascular: Normal rate.   Pulmonary/Chest: Effort normal. No respiratory distress.  Musculoskeletal: Normal range of motion.  Neurological: He is alert and oriented to person, place, and time.  Skin: Skin is warm and dry.  L gr toe not attached til prox 1/4th incl nailbed No bleeding or paronychia Fungal changes 1/345 on L and 1,3,4 on R  Psychiatric: He has a normal mood and affect. His behavior is normal.  Nursing note and vitals reviewed.   BP 110/66 mmHg  Pulse 55  Temp(Src) 97.6 F (36.4 C) (Oral)  Resp 18  Ht 5' 4.5" (1.638 m)  Wt 154 lb (69.854 kg)  BMI 26.04 kg/m2  SpO2 98%       Assessment & Plan:  Needs flu shot - Plan: Flu Vaccine QUAD 36+ mos IM  Injury of toenail, left, initial encounter  Onychomycosis  No orders of the defined types were placed in this encounter.  they are not ready to try lamisil at this point Adv activ as tol  I have completed the patient encounter in its entirety as documented by the scribe, with editing by me where  necessary. Chino Sardo P. Laney Pastor, M.D.

## 2016-02-11 ENCOUNTER — Ambulatory Visit (INDEPENDENT_AMBULATORY_CARE_PROVIDER_SITE_OTHER): Payer: Medicare Other | Admitting: Physician Assistant

## 2016-02-11 VITALS — BP 112/64 | HR 61 | Temp 98.4°F | Resp 18 | Ht 64.5 in | Wt 149.2 lb

## 2016-02-11 DIAGNOSIS — R21 Rash and other nonspecific skin eruption: Secondary | ICD-10-CM | POA: Diagnosis not present

## 2016-02-11 DIAGNOSIS — R42 Dizziness and giddiness: Secondary | ICD-10-CM

## 2016-02-11 DIAGNOSIS — T7840XA Allergy, unspecified, initial encounter: Secondary | ICD-10-CM | POA: Diagnosis not present

## 2016-02-11 DIAGNOSIS — S40862A Insect bite (nonvenomous) of left upper arm, initial encounter: Secondary | ICD-10-CM | POA: Diagnosis not present

## 2016-02-11 NOTE — Patient Instructions (Addendum)
IF you received an x-ray today, you will receive an invoice from [facility]. Please contact [facility] at [phone] with questions or concerns regarding your invoice.   IF you received labwork today, you will receive an invoice from [employer]. Please contact [facility] at [phone] with questions or concerns regarding your invoice.   Our billing staff will not be able to assist you with questions regarding bills from these companies.  You will be contacted with the lab results as soon as they are available. The fastest way to get your results is to activate your My Chart account. Instructions are located on the last page of this paperwork. If you have not heard from [place] regarding the results in 2 weeks, please contact this office.      Anaphylactic Reaction An anaphylactic reaction is a sudden, severe allergic reaction that involves the whole body. It can be life threatening. A hospital stay is often required. People with asthma, eczema, or hay fever are slightly more likely to have an anaphylactic reaction. CAUSES  An anaphylactic reaction may be caused by anything to which you are allergic. After being exposed to the allergic substance, your immune system becomes sensitized to it. When you are exposed to that allergic substance again, an allergic reaction can occur. Common causes of an anaphylactic reaction include:  Medicines.  Foods, especially peanuts, wheat, shellfish, milk, and eggs.  Insect bites or stings.  Blood products.  Chemicals, such as dyes, latex, and contrast material used for imaging tests. SYMPTOMS  When an allergic reaction occurs, the body releases histamine and other substances. These substances cause symptoms such as tightening of the airway. Symptoms often develop within seconds or minutes of exposure. Symptoms may include:  Skin rash or hives.  Itching.  Chest tightness.  Swelling of the eyes,  tongue, or lips.  Trouble breathing or swallowing.  Lightheadedness or fainting.  Anxiety or confusion.  Stomach pains, vomiting, or diarrhea.  Nasal congestion.  A fast or irregular heartbeat (palpitations). DIAGNOSIS  Diagnosis is based on your history of recent exposure to allergic substances, your symptoms, and a physical exam. Your caregiver may also perform blood or urine tests to confirm the diagnosis. TREATMENT  Epinephrine medicine is the main treatment for an anaphylactic reaction. Other medicines that may be used for treatment include antihistamines, steroids, and albuterol. In severe cases, fluids and medicine to support blood pressure may be given through an intravenous line (IV). Even if you improve after treatment, you need to be observed to make sure your condition does not get worse. This may require a stay in the hospital. [place] a medical alert bracelet or necklace stating your allergy.  You and your family must learn how to use an anaphylaxis kit or give an epinephrine injection to temporarily treat an emergency allergic reaction. Always carry your epinephrine injection or anaphylaxis kit with you. This can be lifesaving if you have a severe reaction.  Do not drive or perform tasks after treatment until the medicines used to treat your reaction have worn off, or until your caregiver says it is okay.  If you have hives or a rash:  Take medicines as directed by your caregiver.  You may use an over-the-counter antihistamine (diphenhydramine) as needed.  Apply cold compresses to the skin or take baths in cool water. Avoid hot baths or showers. SEEK MEDICAL CARE IF:   You develop symptoms of an allergic reaction to a new  substance. Symptoms may start right away or minutes later.  You develop a rash, hives, or itching.  You develop new symptoms. SEEK IMMEDIATE MEDICAL CARE IF:   You have swelling of the mouth, difficulty breathing, or  wheezing.  You have a tight feeling in your chest or throat.  You develop hives, swelling, or itching all over your body.  You develop severe vomiting or diarrhea.  You feel faint or pass out. This is an emergency. Use your epinephrine injection or anaphylaxis kit as you have been instructed. Call your local emergency services (911 in [place] Even if you improve after the injection, you need to be examined at a hospital emergency department. MAKE SURE YOU:   Understand these instructions.  Will watch your condition.  Will get help right away if you are not doing well or get worse.   This information is not intended to replace advice given to you by your health care provider. Make sure you discuss any questions you have with your health care provider.   Document Released: 06/13/2005 Document Revised: 06/18/2013 Document Reviewed: 12/24/2014 Elsevier Interactive Patient Education 2016 [place], [place], or [employer], wasps, and hornets are part of a family of insects that can sting people. These stings can cause pain and inflammation, but they are usually not serious. However, some people may have an allergic reaction to a sting. This can cause the symptoms to be more severe.  SYMPTOMS  Common symptoms of this condition include:   A red lump in the skin that sometimes has a tiny hole in the center. In some cases, a stinger may be in the center of the wound.  Pain and itching at the sting site.  Redness and swelling around the sting site. If you have an allergic reaction (localized allergic reaction), the swelling and redness may spread out from the sting site. In some cases, this reaction can continue to develop over the next 12-36 hours. In rare cases, a person may have a severe allergic reaction (anaphylactic reaction) to a sting. Symptoms of an anaphylactic reaction may include:   Wheezing or difficulty breathing.  Raised, itchy, red patches on the skin.  Nausea  or vomiting.  Abdominal cramping.  Diarrhea.  Chest pain.  Fainting.  Redness of the face (flushing). DIAGNOSIS  This condition is usually diagnosed based on symptoms, medical history, and a physical exam. TREATMENT  Most stings can be treated with:   Icing to reduce swelling.  Medicines (antihistamines) to treat itching or an allergic reaction.  Medicines to help reduce pain. These may be medicines that you take by mouth, or medicated creams or lotions that you apply to your skin. If you were stung by a bee, the stinger and a small sac of poison may be in the wound. This may be removed by brushing across it with a flat card, such as a credit card. Another method is to pinch the area and pull it out. These methods can help reduce the severity of the body's reaction to the sting.  HOME CARE INSTRUCTIONS   Wash the sting site daily with soap and water as told by your health care provider.  Apply or take over-the-counter and prescription medicines only as told by your health care provider.  If directed, apply ice to the sting area.  Put ice in a plastic bag.  Place a towel between your skin and the bag.  Leave the ice on for 20 minutes, 2-3 times per day.  Do not scratch the sting area.  To lessen pain, try using a paste that is made of water and baking soda. Rub the paste on the sting area and leave it on for 5 minutes.  If you had a severe allergic reaction to a sting, you may need:  To wear a medical bracelet or necklace that lists the allergy.  To learn when and how to use an anaphylaxis kit or epinephrine injection. Your family members may also need to learn this.  To carry an anaphylaxis kit with you at all times. SEEK MEDICAL CARE IF:   Your symptoms do not get better in 2-3 days.  You have redness, swelling, or pain that spreads beyond the area of the sting.  You have a fever. SEEK IMMEDIATE MEDICAL CARE IF:  You have symptoms of a severe allergic  reaction. These include:   Wheezing or difficulty breathing.  Chest pain.  Light-headedness or fainting.  Itchy, raised, red patches on the skin.  Nausea or vomiting.  Abdominal cramping.  Diarrhea.   This information is not intended to replace advice given to you by your health care provider. Make sure you discuss any questions you have with your health care provider.   Document Released: 06/13/2005 Document Revised: 03/04/2015 Document Reviewed: 10/29/2014 Elsevier Interactive Patient Education [employer].

## 2016-02-11 NOTE — Progress Notes (Signed)
Jason [name]  MRN: [id number] DOB: 11-02-1929  PCP: Minda MeoARONSON,RICHARD A, MD  Subjective:  Pt is an 82 year old male with a history of allergy to bees presenting to clinic for bee stings. He was working in his yard today when he was stung three times in the arms.  The areas of attack immediately turned red and began to swell. His wife put sliced onions on the areas in order to reduce the swelling and "break up the enzymes before they spread".  He says he feels fine, just a little dizzy.  + rash surrounding areas where he was stung.  - Difficulty breathing, wheezing, N/V/D, abdominal pain, generalized rash  [name] has been stung by bees before, experiencing one episode of anaphylaxis where he was taken to the hospital. He does have Epi pens. He has never used an epipen.    Review of Systems  Constitutional: Negative.   Respiratory: Negative for apnea, cough, chest tightness, shortness of breath, wheezing and stridor.   Cardiovascular: Negative for chest pain and palpitations.  Gastrointestinal: Negative for abdominal pain, diarrhea, nausea and vomiting.  Skin: Positive for color change and rash.  Allergic/Immunologic: Positive for environmental allergies (bee stings).  Neurological: Positive for dizziness. Negative for seizures, syncope, speech difficulty, weakness, light-headedness, numbness and headaches.  Psychiatric/Behavioral: Negative for agitation, confusion and decreased concentration. The patient is not nervous/anxious.     Patient Active Problem List   Diagnosis Date Noted  . Dysarthria 06/02/2014  . 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 Osteomyelitis (HCC) 08/07/2013  . Acute sinusitis 07/25/2013  . Osteomyelitis of skull (HCC) 07/25/2013  . Hyperglycemia 07/25/2013  . Normocytic anemia 07/25/2013  . Dysphagia 07/25/2013  . Protein-calorie malnutrition, severe (HCC) 07/02/2013  . Loss of weight 03/24/2013  . GERD (gastroesophageal reflux disease) 03/24/2013      Current Outpatient Prescriptions on File Prior to Visit  Medication Sig Dispense Refill  . cyclobenzaprine (FLEXERIL) 5 MG tablet Take 1 tablet (5 mg total) by mouth at bedtime. (Patient not taking: Reported on 03/10/2015) 10 tablet 0  . doxycycline (VIBRA-TABS) 100 MG tablet Take 1 tablet (100 mg total) by mouth 2 (two) times daily. Do not lie down for 1 hour after meds (Patient not taking: Reported on 03/10/2015) 14 tablet 0   No current facility-administered medications on file prior to visit.     Allergies  Allergen Reactions  . Bee Pollen Anaphylaxis  . Tetanus Toxoids Swelling    Tetanus Shot  . Hydrocodone Other (See Comments)    Confusion, "talking out of his head"    Objective:  BP 112/64   Pulse 61   Temp 98.4 F (36.9 C) (Oral)   Resp 18   Ht 5' 4.5" (1.638 m)   Wt 149 lb 3.2 oz (67.7 kg)   SpO2 96%   BMI 25.21 kg/m   Physical Exam  Constitutional: He is oriented to person, place, and time and well-developed, well-nourished, and in no distress. No distress.  HENT:  Mouth/Throat: Oropharynx is clear and moist. No posterior oropharyngeal edema or posterior oropharyngeal erythema.  Cardiovascular: Normal rate, regular rhythm and normal heart sounds.   Pulmonary/Chest: Effort normal and breath sounds normal. No respiratory distress. He has no wheezes.  Abdominal: Soft.  Neurological: He is alert and oriented to person, place, and time. GCS score is 15.  Skin: Skin is warm and dry. Rash noted. Rash is urticarial (three areas of involvement b/l arms. No urticaria noted on chest, back, face, neck or legs. ).  He is not diaphoretic. No pallor.  Psychiatric: Mood, memory, affect and judgment normal.  Vitals reviewed.   Assessment and Plan :  1. Allergic reaction, initial encounter - There is no concern for anaphylaxis or severe allergic reaction at this time.  - Supportive care: Encouraged Benadryl until his symptoms subside.  Information regarding bee sting  allergies and anaphylaxis printed out and given to patient.  - RTC if his symptoms worsen.    Marco CollieWhitney Fate Galanti, PA-C  [facility] 02/11/2016 1:26 PM

## 2016-10-01 ENCOUNTER — Inpatient Hospital Stay (HOSPITAL_COMMUNITY)
Admission: EM | Admit: 2016-10-01 | Discharge: 2016-10-20 | DRG: 234 | Disposition: A | Discharge status: Skilled Nursing Facility | Payer: Medicare Other | Attending: Cardiothoracic Surgery | Admitting: Cardiothoracic Surgery

## 2016-10-01 ENCOUNTER — Encounter (HOSPITAL_COMMUNITY)
Admission: EM | Disposition: A | Discharge status: Skilled Nursing Facility | Payer: Self-pay | Source: Home / Self Care | Attending: Cardiothoracic Surgery

## 2016-10-01 ENCOUNTER — Inpatient Hospital Stay (HOSPITAL_COMMUNITY): Payer: Medicare Other

## 2016-10-01 ENCOUNTER — Encounter (HOSPITAL_COMMUNITY): Payer: Self-pay

## 2016-10-01 DIAGNOSIS — G629 Polyneuropathy, unspecified: Secondary | ICD-10-CM | POA: Diagnosis present

## 2016-10-01 DIAGNOSIS — I255 Ischemic cardiomyopathy: Secondary | ICD-10-CM | POA: Diagnosis present

## 2016-10-01 DIAGNOSIS — N400 Enlarged prostate without lower urinary tract symptoms: Secondary | ICD-10-CM | POA: Diagnosis present

## 2016-10-01 DIAGNOSIS — D62 Acute posthemorrhagic anemia: Secondary | ICD-10-CM | POA: Diagnosis not present

## 2016-10-01 DIAGNOSIS — F329 Major depressive disorder, single episode, unspecified: Secondary | ICD-10-CM | POA: Diagnosis present

## 2016-10-01 DIAGNOSIS — I2119 ST elevation (STEMI) myocardial infarction involving other coronary artery of inferior wall: Secondary | ICD-10-CM | POA: Diagnosis not present

## 2016-10-01 DIAGNOSIS — K567 Ileus, unspecified: Secondary | ICD-10-CM | POA: Diagnosis not present

## 2016-10-01 DIAGNOSIS — E039 Hypothyroidism, unspecified: Secondary | ICD-10-CM | POA: Diagnosis present

## 2016-10-01 DIAGNOSIS — N183 Chronic kidney disease, stage 3 (moderate): Secondary | ICD-10-CM | POA: Diagnosis not present

## 2016-10-01 DIAGNOSIS — M869 Osteomyelitis, unspecified: Secondary | ICD-10-CM | POA: Diagnosis present

## 2016-10-01 DIAGNOSIS — K219 Gastro-esophageal reflux disease without esophagitis: Secondary | ICD-10-CM | POA: Diagnosis present

## 2016-10-01 DIAGNOSIS — Z9841 Cataract extraction status, right eye: Secondary | ICD-10-CM

## 2016-10-01 DIAGNOSIS — Z01818 Encounter for other preprocedural examination: Secondary | ICD-10-CM

## 2016-10-01 DIAGNOSIS — I213 ST elevation (STEMI) myocardial infarction of unspecified site: Secondary | ICD-10-CM

## 2016-10-01 DIAGNOSIS — E875 Hyperkalemia: Secondary | ICD-10-CM | POA: Diagnosis not present

## 2016-10-01 DIAGNOSIS — G4700 Insomnia, unspecified: Secondary | ICD-10-CM | POA: Diagnosis present

## 2016-10-01 DIAGNOSIS — I351 Nonrheumatic aortic (valve) insufficiency: Secondary | ICD-10-CM | POA: Diagnosis present

## 2016-10-01 DIAGNOSIS — E785 Hyperlipidemia, unspecified: Secondary | ICD-10-CM | POA: Diagnosis present

## 2016-10-01 DIAGNOSIS — R079 Chest pain, unspecified: Secondary | ICD-10-CM | POA: Diagnosis present

## 2016-10-01 DIAGNOSIS — I25110 Atherosclerotic heart disease of native coronary artery with unstable angina pectoris: Secondary | ICD-10-CM | POA: Diagnosis not present

## 2016-10-01 DIAGNOSIS — Z01810 Encounter for preprocedural cardiovascular examination: Secondary | ICD-10-CM | POA: Diagnosis not present

## 2016-10-01 DIAGNOSIS — Z79899 Other long term (current) drug therapy: Secondary | ICD-10-CM

## 2016-10-01 DIAGNOSIS — I2102 ST elevation (STEMI) myocardial infarction involving left anterior descending coronary artery: Principal | ICD-10-CM | POA: Diagnosis present

## 2016-10-01 DIAGNOSIS — I959 Hypotension, unspecified: Secondary | ICD-10-CM | POA: Diagnosis not present

## 2016-10-01 DIAGNOSIS — E8770 Fluid overload, unspecified: Secondary | ICD-10-CM | POA: Diagnosis not present

## 2016-10-01 DIAGNOSIS — F419 Anxiety disorder, unspecified: Secondary | ICD-10-CM | POA: Diagnosis present

## 2016-10-01 DIAGNOSIS — J9811 Atelectasis: Secondary | ICD-10-CM | POA: Diagnosis not present

## 2016-10-01 DIAGNOSIS — I2510 Atherosclerotic heart disease of native coronary artery without angina pectoris: Secondary | ICD-10-CM | POA: Diagnosis present

## 2016-10-01 DIAGNOSIS — Z951 Presence of aortocoronary bypass graft: Secondary | ICD-10-CM | POA: Diagnosis not present

## 2016-10-01 DIAGNOSIS — Z9842 Cataract extraction status, left eye: Secondary | ICD-10-CM | POA: Diagnosis not present

## 2016-10-01 DIAGNOSIS — Z87891 Personal history of nicotine dependence: Secondary | ICD-10-CM

## 2016-10-01 DIAGNOSIS — I2109 ST elevation (STEMI) myocardial infarction involving other coronary artery of anterior wall: Secondary | ICD-10-CM | POA: Diagnosis not present

## 2016-10-01 DIAGNOSIS — I249 Acute ischemic heart disease, unspecified: Secondary | ICD-10-CM | POA: Diagnosis present

## 2016-10-01 DIAGNOSIS — L03115 Cellulitis of right lower limb: Secondary | ICD-10-CM | POA: Diagnosis not present

## 2016-10-01 DIAGNOSIS — R110 Nausea: Secondary | ICD-10-CM

## 2016-10-01 DIAGNOSIS — D696 Thrombocytopenia, unspecified: Secondary | ICD-10-CM | POA: Diagnosis not present

## 2016-10-01 HISTORY — PX: LEFT HEART CATH AND CORONARY ANGIOGRAPHY: CATH118249

## 2016-10-01 LAB — COMPREHENSIVE METABOLIC PANEL
ALK PHOS: 67 U/L (ref 38–126)
ALT: 19 U/L (ref 17–63)
ANION GAP: 9 (ref 5–15)
AST: 30 U/L (ref 15–41)
Albumin: 3.7 g/dL (ref 3.5–5.0)
BUN: 16 mg/dL (ref 6–20)
CALCIUM: 9.3 mg/dL (ref 8.9–10.3)
CO2: 28 mmol/L (ref 22–32)
Chloride: 102 mmol/L (ref 101–111)
Creatinine, Ser: 1.3 mg/dL — ABNORMAL HIGH (ref 0.61–1.24)
GFR calc non Af Amer: 49 mL/min — ABNORMAL LOW (ref >60)
GFR, EST AFRICAN AMERICAN: 56 mL/min — AB (ref >60)
GLUCOSE: 100 mg/dL — AB (ref 65–99)
Potassium: 4.6 mmol/L (ref 3.5–5.1)
Sodium: 139 mmol/L (ref 135–145)
TOTAL PROTEIN: 6.8 g/dL (ref 6.5–8.1)
Total Bilirubin: 1.3 mg/dL — ABNORMAL HIGH (ref 0.3–1.2)

## 2016-10-01 LAB — LIPID PANEL
CHOLESTEROL: 204 mg/dL — AB (ref 0–200)
HDL: 46 mg/dL (ref >40)
LDL CALC: 109 mg/dL — AB (ref 0–99)
TRIGLYCERIDES: 245 mg/dL — AB (ref <150)
Total CHOL/HDL Ratio: 4.4 RATIO
VLDL: 49 mg/dL — ABNORMAL HIGH (ref 0–40)

## 2016-10-01 LAB — I-STAT CHEM 8, ED
BUN: 21 mg/dL — ABNORMAL HIGH (ref 6–20)
CREATININE: 1.4 mg/dL — AB (ref 0.61–1.24)
Calcium, Ion: 1.18 mmol/L (ref 1.15–1.40)
Chloride: 102 mmol/L (ref 101–111)
GLUCOSE: 101 mg/dL — AB (ref 65–99)
HCT: 43 % (ref 39.0–52.0)
HEMOGLOBIN: 14.6 g/dL (ref 13.0–17.0)
POTASSIUM: 4.8 mmol/L (ref 3.5–5.1)
Sodium: 140 mmol/L (ref 135–145)
TCO2: 31 mmol/L (ref 0–100)

## 2016-10-01 LAB — BRAIN NATRIURETIC PEPTIDE
B NATRIURETIC PEPTIDE 5: 82.7 pg/mL (ref 0.0–100.0)
B Natriuretic Peptide: 59.7 pg/mL (ref 0.0–100.0)

## 2016-10-01 LAB — PROTIME-INR
INR: 0.99
PROTHROMBIN TIME: 13.1 s (ref 11.4–15.2)

## 2016-10-01 LAB — DIFFERENTIAL
Basophils Absolute: 0.1 10*3/uL (ref 0.0–0.1)
Basophils Relative: 1 %
EOS PCT: 1 %
Eosinophils Absolute: 0.1 10*3/uL (ref 0.0–0.7)
LYMPHS PCT: 14 %
Lymphs Abs: 1.3 10*3/uL (ref 0.7–4.0)
MONO ABS: 0.8 10*3/uL (ref 0.1–1.0)
Monocytes Relative: 8 %
NEUTROS PCT: 76 %
Neutro Abs: 7.1 10*3/uL (ref 1.7–7.7)

## 2016-10-01 LAB — CBC
HCT: 43.5 % (ref 39.0–52.0)
Hemoglobin: 14.6 g/dL (ref 13.0–17.0)
MCH: 30.1 pg (ref 26.0–34.0)
MCHC: 33.6 g/dL (ref 30.0–36.0)
MCV: 89.7 fL (ref 78.0–100.0)
PLATELETS: 156 10*3/uL (ref 150–400)
RBC: 4.85 MIL/uL (ref 4.22–5.81)
RDW: 13.6 % (ref 11.5–15.5)
WBC: 9.3 10*3/uL (ref 4.0–10.5)

## 2016-10-01 LAB — TSH: TSH: 6.063 u[IU]/mL — AB (ref 0.350–4.500)

## 2016-10-01 LAB — I-STAT TROPONIN, ED: TROPONIN I, POC: 0.19 ng/mL — AB (ref 0.00–0.08)

## 2016-10-01 LAB — TROPONIN I
TROPONIN I: 28.46 ng/mL — AB (ref <0.03)
Troponin I: 0.42 ng/mL (ref <0.03)

## 2016-10-01 LAB — MAGNESIUM: MAGNESIUM: 2.1 mg/dL (ref 1.7–2.4)

## 2016-10-01 LAB — APTT: aPTT: 32 seconds (ref 24–36)

## 2016-10-01 LAB — MRSA PCR SCREENING: MRSA by PCR: NEGATIVE

## 2016-10-01 SURGERY — LEFT HEART CATH AND CORONARY ANGIOGRAPHY
Anesthesia: LOCAL

## 2016-10-01 MED ORDER — SODIUM CHLORIDE 0.9 % WEIGHT BASED INFUSION: 1.0000 mL/kg/h | INTRAVENOUS | Status: AC

## 2016-10-01 MED ORDER — NITROGLYCERIN IN D5W 200-5 MCG/ML-% IV SOLN
INTRAVENOUS | Status: AC
  Filled 2016-10-01: qty 250

## 2016-10-01 MED ORDER — HEPARIN (PORCINE) IN NACL 100-0.45 UNIT/ML-% IJ SOLN
900.0000 [IU]/h | INTRAMUSCULAR | Status: DC
  Administered 2016-10-01: 900 [IU]/h via INTRAVENOUS
  Filled 2016-10-01: qty 250

## 2016-10-01 MED ORDER — TICAGRELOR 90 MG PO TABS
ORAL_TABLET | ORAL | Status: AC
  Filled 2016-10-01: qty 2

## 2016-10-01 MED ORDER — SODIUM CHLORIDE 0.9% FLUSH
3.0000 mL | Freq: Two times a day (BID) | INTRAVENOUS | Status: DC
  Administered 2016-10-02 – 2016-10-06 (×5): 3 mL via INTRAVENOUS
  Administered 2016-10-06: 10 mL via INTRAVENOUS

## 2016-10-01 MED ORDER — FENTANYL CITRATE (PF) 100 MCG/2ML IJ SOLN
INTRAMUSCULAR | Status: DC | PRN
  Administered 2016-10-01: 50 ug via INTRAVENOUS

## 2016-10-01 MED ORDER — SODIUM CHLORIDE 0.9 % WEIGHT BASED INFUSION: 1.0000 mL/kg/h | INTRAVENOUS | Status: DC

## 2016-10-01 MED ORDER — ONDANSETRON HCL 4 MG/2ML IJ SOLN: 4.0000 mg | Freq: Four times a day (QID) | INTRAMUSCULAR | Status: DC | PRN

## 2016-10-01 MED ORDER — ASPIRIN 81 MG PO CHEW: 324.0000 mg | CHEWABLE_TABLET | ORAL | Status: DC

## 2016-10-01 MED ORDER — IOPAMIDOL (ISOVUE-370) INJECTION 76%
INTRAVENOUS | Status: AC
  Filled 2016-10-01: qty 100

## 2016-10-01 MED ORDER — IOPAMIDOL (ISOVUE-370) INJECTION 76%
INTRAVENOUS | Status: DC | PRN
  Administered 2016-10-01: 100 mL via INTRA_ARTERIAL

## 2016-10-01 MED ORDER — SODIUM CHLORIDE 0.9 % IV SOLN: 250.0000 mL | INTRAVENOUS | Status: DC | PRN

## 2016-10-01 MED ORDER — NITROGLYCERIN 0.4 MG SL SUBL: 0.4000 mg | SUBLINGUAL_TABLET | SUBLINGUAL | Status: DC | PRN

## 2016-10-01 MED ORDER — HEPARIN (PORCINE) IN NACL 100-0.45 UNIT/ML-% IJ SOLN: 900.0000 [IU]/h | INTRAMUSCULAR | Status: DC

## 2016-10-01 MED ORDER — IOPAMIDOL (ISOVUE-370) INJECTION 76%
INTRAVENOUS | Status: AC
  Filled 2016-10-01: qty 125

## 2016-10-01 MED ORDER — MIDAZOLAM HCL 2 MG/2ML IJ SOLN
INTRAMUSCULAR | Status: DC | PRN
  Administered 2016-10-01: 1 mg via INTRAVENOUS

## 2016-10-01 MED ORDER — TICAGRELOR 90 MG PO TABS
ORAL_TABLET | ORAL | Status: DC | PRN
  Administered 2016-10-01: 180 mg via ORAL

## 2016-10-01 MED ORDER — NITROGLYCERIN IN D5W 200-5 MCG/ML-% IV SOLN
0.0000 ug/min | INTRAVENOUS | Status: DC
  Administered 2016-10-06: 5 ug/min via INTRAVENOUS
  Filled 2016-10-01 (×2): qty 250

## 2016-10-01 MED ORDER — FENTANYL CITRATE (PF) 100 MCG/2ML IJ SOLN
INTRAMUSCULAR | Status: AC
  Filled 2016-10-01: qty 2

## 2016-10-01 MED ORDER — NITROGLYCERIN IN D5W 200-5 MCG/ML-% IV SOLN
0.0000 ug/min | Freq: Once | INTRAVENOUS | Status: AC
Start: 2016-10-01 — End: 2016-10-01
  Administered 2016-10-01: 5 ug/min via INTRAVENOUS

## 2016-10-01 MED ORDER — VERAPAMIL HCL 2.5 MG/ML IV SOLN
INTRAVENOUS | Status: AC
  Filled 2016-10-01: qty 2

## 2016-10-01 MED ORDER — HEPARIN (PORCINE) IN NACL 2-0.9 UNIT/ML-% IJ SOLN
INTRAMUSCULAR | Status: AC
  Filled 2016-10-01: qty 1000

## 2016-10-01 MED ORDER — ACETAMINOPHEN 325 MG PO TABS: 650.0000 mg | ORAL_TABLET | ORAL | Status: DC | PRN

## 2016-10-01 MED ORDER — HEPARIN SODIUM (PORCINE) 1000 UNIT/ML IJ SOLN
INTRAMUSCULAR | Status: DC | PRN
  Administered 2016-10-01: 7500 [IU] via INTRAVENOUS

## 2016-10-01 MED ORDER — HEPARIN (PORCINE) IN NACL 100-0.45 UNIT/ML-% IJ SOLN
1000.0000 [IU]/h | INTRAMUSCULAR | Status: DC
  Administered 2016-10-04 – 2016-10-05 (×2): 900 [IU]/h via INTRAVENOUS
  Administered 2016-10-06: 1000 [IU]/h via INTRAVENOUS
  Filled 2016-10-01 (×4): qty 250

## 2016-10-01 MED ORDER — ATORVASTATIN CALCIUM 80 MG PO TABS
80.0000 mg | ORAL_TABLET | Freq: Every day | ORAL | Status: DC
  Administered 2016-10-02 – 2016-10-06 (×5): 80 mg via ORAL
  Filled 2016-10-01 (×5): qty 1

## 2016-10-01 MED ORDER — HEPARIN (PORCINE) IN NACL 100-0.45 UNIT/ML-% IJ SOLN
900.0000 [IU]/h | INTRAMUSCULAR | Status: DC
  Filled 2016-10-01: qty 250

## 2016-10-01 MED ORDER — MIDAZOLAM HCL 2 MG/2ML IJ SOLN
INTRAMUSCULAR | Status: AC
  Filled 2016-10-01: qty 2

## 2016-10-01 MED ORDER — LIDOCAINE HCL (PF) 1 % IJ SOLN
INTRAMUSCULAR | Status: DC | PRN
  Administered 2016-10-01: 1 mL

## 2016-10-01 MED ORDER — VERAPAMIL HCL 2.5 MG/ML IV SOLN
INTRAVENOUS | Status: DC | PRN
  Administered 2016-10-01: 19:00:00 via INTRA_ARTERIAL

## 2016-10-01 MED ORDER — SODIUM CHLORIDE 0.9% FLUSH
3.0000 mL | INTRAVENOUS | Status: DC | PRN
Start: 2016-10-02 — End: 2016-10-07

## 2016-10-01 MED ORDER — LIDOCAINE HCL (PF) 1 % IJ SOLN
INTRAMUSCULAR | Status: AC
  Filled 2016-10-01: qty 30

## 2016-10-01 MED ORDER — HEPARIN (PORCINE) IN NACL 2-0.9 UNIT/ML-% IJ SOLN
INTRAMUSCULAR | Status: DC | PRN
  Administered 2016-10-01: 1000 mL

## 2016-10-01 MED ORDER — ASPIRIN 300 MG RE SUPP
300.0000 mg | RECTAL | Status: DC
Start: 2016-10-01 — End: 2016-10-02

## 2016-10-01 MED ORDER — HEPARIN SODIUM (PORCINE) 1000 UNIT/ML IJ SOLN
INTRAMUSCULAR | Status: AC
  Filled 2016-10-01: qty 1

## 2016-10-01 MED ORDER — METOPROLOL TARTRATE 12.5 MG HALF TABLET
12.5000 mg | ORAL_TABLET | Freq: Two times a day (BID) | ORAL | Status: DC
  Administered 2016-10-02 – 2016-10-06 (×10): 12.5 mg via ORAL
  Filled 2016-10-01 (×10): qty 1

## 2016-10-01 MED ORDER — SODIUM CHLORIDE 0.9 % WEIGHT BASED INFUSION
3.0000 mL/kg/h | INTRAVENOUS | Status: DC
Start: 2016-10-01 — End: 2016-10-01

## 2016-10-01 MED ORDER — ASPIRIN EC 81 MG PO TBEC: 81.0000 mg | DELAYED_RELEASE_TABLET | Freq: Every day | ORAL | Status: DC

## 2016-10-01 MED ORDER — NITROGLYCERIN 1 MG/10 ML FOR IR/CATH LAB
INTRA_ARTERIAL | Status: AC
  Filled 2016-10-01: qty 10

## 2016-10-01 MED ORDER — ASPIRIN 81 MG PO CHEW
81.0000 mg | CHEWABLE_TABLET | Freq: Every day | ORAL | Status: DC
  Administered 2016-10-02 – 2016-10-06 (×5): 81 mg via ORAL
  Filled 2016-10-01 (×5): qty 1

## 2016-10-01 MED ORDER — HEPARIN SODIUM (PORCINE) 5000 UNIT/ML IJ SOLN
4000.0000 [IU] | Freq: Once | INTRAMUSCULAR | Status: AC
  Administered 2016-10-01: 4000 [IU] via INTRAVENOUS

## 2016-10-01 SURGICAL SUPPLY — 11 items
CATH INFINITI JR4 5F (CATHETERS) ×2 IMPLANT
CATH VISTA GUIDE 6FR XBLAD3.0 (CATHETERS) ×2 IMPLANT
DEVICE RAD COMP TR BAND LRG (VASCULAR PRODUCTS) ×2 IMPLANT
GLIDESHEATH SLEND A-KIT 6F 22G (SHEATH) ×2 IMPLANT
GLIDESHEATH SLEND SS 6F .021 (SHEATH) IMPLANT
KIT ENCORE 26 ADVANTAGE (KITS) ×2 IMPLANT
KIT HEART LEFT (KITS) ×2 IMPLANT
PACK CARDIAC CATHETERIZATION (CUSTOM PROCEDURE TRAY) ×2 IMPLANT
TRANSDUCER W/STOPCOCK (MISCELLANEOUS) ×2 IMPLANT
TUBING CIL FLEX 10 FLL-RA (TUBING) ×2 IMPLANT
WIRE ASAHI PROWATER 180CM (WIRE) ×2 IMPLANT

## 2016-10-01 NOTE — H&P (Addendum)
Primary cardiologist: New  HPI: 83 year old male with no prior cardiac history admitted with aborted anterior myocardial infarction. Patient had pain between his scapula approximately 1 week ago that resolved with pain medicines after a full day working in his yard. Today at approximately 1 PMl he developed the same pain described as a dull sensation with numbness in his right upper extremity. There was associated diaphoresis but no nausea or dyspnea. He went to an [facility].The pain intensified and would not resolve and EMS was called. His initial electrocardiogram showed anterior ST elevation. In route his symptoms resolved and follow-up electrocardiogram in the emergency room showed resolution of his anterior S elevation. Cardiology now asked to evaluate.  Medications Prior to Admission  Medication Sig Dispense Refill  . cyclobenzaprine (FLEXERIL) 5 MG tablet Take 1 tablet (5 mg total) by mouth at bedtime. (Patient not taking: Reported on 03/10/2015) 10 tablet 0  . doxycycline (VIBRA-TABS) 100 MG tablet Take 1 tablet (100 mg total) by mouth 2 (two) times daily. Do not lie down for 1 hour after meds (Patient not taking: Reported on 03/10/2015) 14 tablet 0    Allergies  Allergen Reactions  . Bee Pollen Anaphylaxis  . Tetanus Toxoids Swelling    Tetanus Shot  . Hydrocodone Other (See Comments)    Confusion, "talking out of his head"     Past Medical History:  Diagnosis Date  . Anxiety    takes Diazepam daily prn  . Arthritis   . Constipation   . Depression    takes Trazodone nightly  . Enlarged prostate   . GERD (gastroesophageal reflux disease)   . H/O hiatal hernia    takes Omeprazole daily  . Hemorrhoids   . History of colitis    many yrs ago  . History of colon polyps   . History of kidney stones    passed on his own  . Hyperlipidemia    was on medication but has been off for a while  . Insomnia    takes Trazodone nightly  . Osteomyelitis of skull  ([place]) 07/2013  . Urinary frequency     Past Surgical History:  Procedure Laterality Date  . bilateral cataract surgery    . CIRCUMCISION    . COLONOSCOPY    . DIRECT LARYNGOSCOPY N/A 06/26/2013   Procedure: DIRECT LARYNGOSCOPY;  Surgeon: Melida Quitter, MD;  Location: [place];  Service: ENT;  Laterality: N/A;  . EAR CYST EXCISION N/A 04/05/2013   Procedure: CYST REMOVAL;  Surgeon: Ruby Cola, MD;  Location: [place];  Service: ENT;  Laterality: N/A;  . ESOPHAGOGASTRODUODENOSCOPY    . ESOPHAGOGASTRODUODENOSCOPY N/A 07/15/2013   Procedure: ESOPHAGOGASTRODUODENOSCOPY (EGD);  Surgeon: Gwenyth Ober, MD;  Location: [place];  Service: General;  Laterality: N/A;  . HEMORRHOIDECTOMY WITH HEMORRHOID BANDING    . HERNIA REPAIR     double  . LEFT HEART CATH AND CORONARY ANGIOGRAPHY N/A 10/01/2016   Procedure: Left Heart Cath and Coronary Angiography;  Surgeon: Belva Crome, MD;  Location: [place] [facility];  Service: Cardiovascular;  Laterality: N/A;  . PEG PLACEMENT N/A 07/15/2013   Procedure: PERCUTANEOUS ENDOSCOPIC GASTROSTOMY (PEG) PLACEMENT;  Surgeon: Gwenyth Ober, MD;  Location: [place];  Service: General;  Laterality: N/A;  . PERIPHERALLY [place]    . right knee arthroscopy    . SEPTOPLASTY N/A 04/05/2013   Procedure: SEPTOPLASTY;  Surgeon: Ruby Cola, MD;  Location: [place];  Service: ENT;  Laterality:  N/A;  . SINUS ENDO W/FUSION N/A 07/03/2013   Procedure: ENDOSCOPIC SINUS SURGERY WITH FUSION NAVIGATION with By-Nasopharongeal Biopsy;  Surgeon: Melida Quitter, MD;  Location: [place];  Service: ENT;  Laterality: N/A;  . SINUS EXPLORATION Bilateral 07/11/2013   Procedure:  BEDSIDE  SINUS EXPLORATION ;  Surgeon: Melida Quitter, MD;  Location: [place];  Service: ENT;  Laterality: Bilateral;  . TONSILLECTOMY      Social History   Social History  . Marital status: Married    Spouse name: N/A  . Number of children: N/A  . Years of education: N/A   Occupational  History  . Not on file.   Social History Main Topics  . Smoking status: Former Smoker    Quit date: 07/01/1993  . Smokeless tobacco: Former Systems developer    Types: [place] date: 12/29/2012     Comment: quit chewing tobacco several months ago and stopped smoking cigars yrs ago  . Alcohol use No  . Drug use: No  . Sexual activity: No   Other Topics Concern  . Not on file   Social History Narrative  . No narrative on file    Family History  Problem Relation Age of Onset  . CAD Father     ROS:  no fevers or chills, productive cough, hemoptysis, dysphasia, odynophagia, melena, hematochezia, dysuria, hematuria, rash, seizure activity, orthopnea, PND, pedal edema, claudication. Remaining systems are negative.  Physical Exam:   Blood pressure (!) 99/58, pulse 60, temperature 97.9 F (36.6 C), temperature source Oral, resp. rate (!) 22, height 5' 4"  (1.626 m), weight 152 lb 1.9 oz (69 kg), SpO2 95 %.  General:  Well developed/well nourished in NAD Skin warm/dry Patient not depressed No peripheral clubbing Back-normal HEENT-normal/normal eyelids Neck supple/normal carotid upstroke bilaterally; no bruits; no JVD; no thyromegaly chest - CTA/ normal expansion CV - distant heart sounds, RRR/normal S1 and S2; no murmurs, rubs or gallops;  PMI nondisplaced Abdomen -NT/ND, no HSM, no mass, + bowel sounds, no bruit 2+ femoral pulses, no bruits Ext-no edema, chords, 2+ DP Neuro-grossly nonfocal  ECG -initial electrocardiogram from EMS showed ST elevation in V2, V3 and V4. Follow-up electrocardiogram shows sinus rhythm with almost complete resolution of ST elevation; personally reviewed  Results for orders placed or performed during the hospital encounter of 10/01/16 (from the past 48 hour(s))  CBC     Status: None   Collection Time: 10/01/16  4:26 PM  Result Value Ref Range   WBC 9.3 4.0 - 10.5 K/uL   RBC 4.85 4.22 - 5.81 MIL/uL   Hemoglobin 14.6 13.0 - 17.0 g/dL   HCT 43.5 39.0 - 52.0 %    MCV 89.7 78.0 - 100.0 fL   MCH 30.1 26.0 - 34.0 pg   MCHC 33.6 30.0 - 36.0 g/dL   RDW 13.6 11.5 - 15.5 %   Platelets 156 150 - 400 K/uL  Differential     Status: None   Collection Time: 10/01/16  4:26 PM  Result Value Ref Range   Neutrophils Relative % 76 %   Neutro Abs 7.1 1.7 - 7.7 K/uL   Lymphocytes Relative 14 %   Lymphs Abs 1.3 0.7 - 4.0 K/uL   Monocytes Relative 8 %   Monocytes Absolute 0.8 0.1 - 1.0 K/uL   Eosinophils Relative 1 %   Eosinophils Absolute 0.1 0.0 - 0.7 K/uL   Basophils Relative 1 %   Basophils Absolute 0.1 0.0 - 0.1 K/uL  Protime-INR  Status: None   Collection Time: 10/01/16  4:26 PM  Result Value Ref Range   Prothrombin Time 13.1 11.4 - 15.2 seconds   INR 0.99   APTT     Status: None   Collection Time: 10/01/16  4:26 PM  Result Value Ref Range   aPTT 32 24 - 36 seconds  Comprehensive metabolic panel     Status: Abnormal   Collection Time: 10/01/16  4:26 PM  Result Value Ref Range   Sodium 139 135 - 145 mmol/L   Potassium 4.6 3.5 - 5.1 mmol/L   Chloride 102 101 - 111 mmol/L   CO2 28 22 - 32 mmol/L   Glucose, Bld 100 (H) 65 - 99 mg/dL   BUN 16 6 - 20 mg/dL   Creatinine, Ser 1.30 (H) 0.61 - 1.24 mg/dL   Calcium 9.3 8.9 - 10.3 mg/dL   Total Protein 6.8 6.5 - 8.1 g/dL   Albumin 3.7 3.5 - 5.0 g/dL   AST 30 15 - 41 U/L   ALT 19 17 - 63 U/L   Alkaline Phosphatase 67 38 - 126 U/L   Total Bilirubin 1.3 (H) 0.3 - 1.2 mg/dL   GFR calc non Af Amer 49 (L) >60 mL/min   GFR calc Af Amer 56 (L) >60 mL/min    Comment: (NOTE) The eGFR has been calculated using the CKD EPI equation. This calculation has not been validated in all clinical situations. eGFR's persistently <60 mL/min signify possible Chronic Kidney Disease.    Anion gap 9 5 - 15  Troponin I     Status: Abnormal   Collection Time: 10/01/16  4:26 PM  Result Value Ref Range   Troponin I 0.42 (HH) <0.03 ng/mL    Comment: CRITICAL RESULT CALLED TO, READ BACK BY AND VERIFIED WITH: A. [place]  [id number] R   Lipid panel     Status: Abnormal   Collection Time: 10/01/16  4:26 PM  Result Value Ref Range   Cholesterol 204 (H) 0 - 200 mg/dL   Triglycerides 245 (H) <150 mg/dL   HDL 46 >40 mg/dL   Total CHOL/HDL Ratio 4.4 RATIO   VLDL 49 (H) 0 - 40 mg/dL   LDL Cholesterol 109 (H) 0 - 99 mg/dL    Comment:        Total Cholesterol/HDL:CHD Risk Coronary Heart Disease Risk Table                     Men   Women  1/2 Average Risk   3.4   3.3  Average Risk       5.0   4.4  2 X Average Risk   9.6   7.1  3 X Average Risk  23.4   11.0        Use the calculated Patient Ratio above and the CHD Risk Table to determine the patient's CHD Risk.        ATP III CLASSIFICATION (LDL):  <100     mg/dL   Optimal  100-129  mg/dL   Near or Above                    Optimal  130-159  mg/dL   Borderline  160-189  mg/dL   High  >190     mg/dL   Very High   Magnesium     Status: None   Collection Time: 10/01/16  4:26 PM  Result Value Ref Range   Magnesium 2.1  1.7 - 2.4 mg/dL  Brain natriuretic peptide     Status: None   Collection Time: 10/01/16  4:27 PM  Result Value Ref Range   B Natriuretic Peptide 59.7 0.0 - 100.0 pg/mL  I-Stat Troponin, ED (not at [facility])     Status: Abnormal   Collection Time: 10/01/16  4:35 PM  Result Value Ref Range   Troponin i, poc 0.19 (HH) 0.00 - 0.08 ng/mL   Comment NOTIFIED PHYSICIAN    Comment 3            Comment: Due to the release kinetics of cTnI, a negative result within the first hours of the onset of symptoms does not rule out myocardial infarction with certainty. If myocardial infarction is still suspected, repeat the test at appropriate intervals.   I-Stat Chem 8, ED     Status: Abnormal   Collection Time: 10/01/16  4:37 PM  Result Value Ref Range   Sodium 140 135 - 145 mmol/L   Potassium 4.8 3.5 - 5.1 mmol/L   Chloride 102 101 - 111 mmol/L   BUN 21 (H) 6 - 20 mg/dL   Creatinine, Ser 1.40 (H) 0.61 - 1.24 mg/dL   Glucose, Bld 101 (H) 65  - 99 mg/dL   Calcium, Ion 1.18 1.15 - 1.40 mmol/L   TCO2 31 0 - 100 mmol/L   Hemoglobin 14.6 13.0 - 17.0 g/dL   HCT 43.0 39.0 - 52.0 %  TSH     Status: Abnormal   Collection Time: 10/01/16  4:44 PM  Result Value Ref Range   TSH 6.063 (H) 0.350 - 4.500 uIU/mL    Comment: Performed by a 3rd Generation assay with a functional sensitivity of <=0.01 uIU/mL.  MRSA PCR Screening     Status: None   Collection Time: 10/01/16  6:00 PM  Result Value Ref Range   MRSA by PCR NEGATIVE NEGATIVE    Comment:        The GeneXpert MRSA Assay (FDA approved for NASAL specimens only), is one component of a comprehensive MRSA colonization surveillance program. It is not intended to diagnose MRSA infection nor to guide or monitor treatment for MRSA infections.   POCT Activated clotting time     Status: None   Collection Time: 10/01/16  7:12 PM  Result Value Ref Range   Activated Clotting Time 268 seconds  Troponin I     Status: Abnormal   Collection Time: 10/01/16  7:50 PM  Result Value Ref Range   Troponin I 28.46 (HH) <0.03 ng/mL    Comment: CRITICAL RESULT CALLED TO, READ BACK BY AND VERIFIED WITH: LAWLESS,K RN 10/01/2016 2159 JORDANS   Hemoglobin A1c     Status: None   Collection Time: 10/01/16  7:50 PM  Result Value Ref Range   Hgb A1c MFr Bld 5.3 4.8 - 5.6 %    Comment: (NOTE)         Pre-diabetes: 5.7 - 6.4         Diabetes: >6.4         Glycemic control for adults with diabetes: <7.0    Mean Plasma Glucose 105 mg/dL    Comment: (NOTE) Performed At: [facility] [address] [place], [place] [id number] Lindon Romp MD [id number]   Brain natriuretic peptide     Status: None   Collection Time: 10/01/16  7:50 PM  Result Value Ref Range   B Natriuretic Peptide 82.7 0.0 - 100.0 pg/mL  Troponin I     Status:  Abnormal   Collection Time: 10/02/16  4:55 AM  Result Value Ref Range   Troponin I 47.50 (HH) <0.03 ng/mL    Comment: CRITICAL VALUE NOTED.  VALUE IS  CONSISTENT WITH PREVIOUSLY REPORTED AND CALLED VALUE.  Basic metabolic panel     Status: Abnormal   Collection Time: 10/02/16  4:55 AM  Result Value Ref Range   Sodium 139 135 - 145 mmol/L   Potassium 3.9 3.5 - 5.1 mmol/L    Comment: DELTA CHECK NOTED   Chloride 106 101 - 111 mmol/L   CO2 23 22 - 32 mmol/L   Glucose, Bld 95 65 - 99 mg/dL   BUN 13 6 - 20 mg/dL   Creatinine, Ser 1.08 0.61 - 1.24 mg/dL   Calcium 8.5 (L) 8.9 - 10.3 mg/dL   GFR calc non Af Amer >60 >60 mL/min   GFR calc Af Amer >60 >60 mL/min    Comment: (NOTE) The eGFR has been calculated using the CKD EPI equation. This calculation has not been validated in all clinical situations. eGFR's persistently <60 mL/min signify possible Chronic Kidney Disease.    Anion gap 10 5 - 15  Lipid panel     Status: Abnormal   Collection Time: 10/02/16  4:55 AM  Result Value Ref Range   Cholesterol 173 0 - 200 mg/dL   Triglycerides 139 <150 mg/dL   HDL 42 >40 mg/dL   Total CHOL/HDL Ratio 4.1 RATIO   VLDL 28 0 - 40 mg/dL   LDL Cholesterol 103 (H) 0 - 99 mg/dL    Comment:        Total Cholesterol/HDL:CHD Risk Coronary Heart Disease Risk Table                     Men   Women  1/2 Average Risk   3.4   3.3  Average Risk       5.0   4.4  2 X Average Risk   9.6   7.1  3 X Average Risk  23.4   11.0        Use the calculated Patient Ratio above and the CHD Risk Table to determine the patient's CHD Risk.        ATP III CLASSIFICATION (LDL):  <100     mg/dL   Optimal  100-129  mg/dL   Near or Above                    Optimal  130-159  mg/dL   Borderline  160-189  mg/dL   High  >190     mg/dL   Very High   CBC     Status: Abnormal   Collection Time: 10/02/16  4:55 AM  Result Value Ref Range   WBC 8.7 4.0 - 10.5 K/uL   RBC 4.26 4.22 - 5.81 MIL/uL   Hemoglobin 12.7 (L) 13.0 - 17.0 g/dL   HCT 37.7 (L) 39.0 - 52.0 %   MCV 88.5 78.0 - 100.0 fL   MCH 29.8 26.0 - 34.0 pg   MCHC 33.7 30.0 - 36.0 g/dL   RDW 13.7 11.5 - 15.5 %    Platelets 142 (L) 150 - 400 K/uL  Platelet inhibition p2y12 (Not at [facility])     Status: Abnormal   Collection Time: 10/02/16  4:55 AM  Result Value Ref Range   Platelet Function  P2Y12 159 (L) 194 - 418 PRU  Heparin level (unfractionated)     Status: None  Collection Time: 10/02/16 11:47 AM  Result Value Ref Range   Heparin Unfractionated 0.42 0.30 - 0.70 IU/mL    Comment:        IF HEPARIN RESULTS ARE BELOW EXPECTED VALUES, AND PATIENT DOSAGE HAS BEEN CONFIRMED, SUGGEST FOLLOW UP TESTING OF ANTITHROMBIN III LEVELS.   Heparin level (unfractionated)     Status: None   Collection Time: 10/03/16  1:59 AM  Result Value Ref Range   Heparin Unfractionated 0.53 0.30 - 0.70 IU/mL    Comment:        IF HEPARIN RESULTS ARE BELOW EXPECTED VALUES, AND PATIENT DOSAGE HAS BEEN CONFIRMED, SUGGEST FOLLOW UP TESTING OF ANTITHROMBIN III LEVELS.   CBC     Status: Abnormal   Collection Time: 10/03/16  1:59 AM  Result Value Ref Range   WBC 9.9 4.0 - 10.5 K/uL   RBC 4.32 4.22 - 5.81 MIL/uL   Hemoglobin 12.9 (L) 13.0 - 17.0 g/dL   HCT 38.2 (L) 39.0 - 52.0 %   MCV 88.4 78.0 - 100.0 fL   MCH 29.9 26.0 - 34.0 pg   MCHC 33.8 30.0 - 36.0 g/dL   RDW 13.8 11.5 - 15.5 %   Platelets 150 150 - 400 K/uL  Comprehensive metabolic panel     Status: Abnormal   Collection Time: 10/03/16  1:59 AM  Result Value Ref Range   Sodium 138 135 - 145 mmol/L   Potassium 3.7 3.5 - 5.1 mmol/L   Chloride 107 101 - 111 mmol/L   CO2 22 22 - 32 mmol/L   Glucose, Bld 99 65 - 99 mg/dL   BUN 13 6 - 20 mg/dL   Creatinine, Ser 1.13 0.61 - 1.24 mg/dL   Calcium 8.5 (L) 8.9 - 10.3 mg/dL   Total Protein 5.8 (L) 6.5 - 8.1 g/dL   Albumin 3.2 (L) 3.5 - 5.0 g/dL   AST 73 (H) 15 - 41 U/L   ALT 25 17 - 63 U/L   Alkaline Phosphatase 59 38 - 126 U/L   Total Bilirubin 1.8 (H) 0.3 - 1.2 mg/dL   GFR calc non Af Amer 58 (L) >60 mL/min   GFR calc Af Amer >60 >60 mL/min    Comment: (NOTE) The eGFR has been calculated using the CKD EPI  equation. This calculation has not been validated in all clinical situations. eGFR's persistently <60 mL/min signify possible Chronic Kidney Disease.    Anion gap 9 5 - 15  Brain natriuretic peptide     Status: Abnormal   Collection Time: 10/03/16  1:59 AM  Result Value Ref Range   B Natriuretic Peptide 269.4 (H) 0.0 - 100.0 pg/mL  Platelet inhibition p2y12 (Not at [facility])     Status: None   Collection Time: 10/03/16  1:59 AM  Result Value Ref Range   Platelet Function  P2Y12 197 194 - 418 PRU    Comment:        The literature has shown a direct correlation of PRU values over 230 with higher risks of thrombotic events.  Lower PRU values are associated with platelet inhibition.     Dg Chest Portable 1 View  Result Date: 10/01/2016 CLINICAL DATA:  Myocardial infarction EXAM: PORTABLE CHEST 1 VIEW COMPARISON:  August 13, 2014 FINDINGS: The heart size and mediastinal contours are within normal limits. Both lungs are clear. The visualized skeletal structures are unremarkable. IMPRESSION: No active disease. Electronically Signed   By: Dorise Bullion III M.D   On: 10/01/2016 17:21  Assessment/Plan 1 anterior myocardial infarction-patient developed back pain earlier today and there was note of ST elevation in the anterior leads. His symptoms resolved and his electrocardiogram showed essentially normalization of ST segments. He is presently pain-free. Plan to treat with aspirin, heparin, beta blockade, nitroglycerin and statin. He will require cardiac catheterization. The risks and benefits including myocardial infarction, CVA and death were discussed and he agrees to proceed.  2 chronic stage III kidney disease-patient will be hydrated following procedure. He will need close follow-up of his renal function afterwards.  3 history of hypothyroidism-apparently not taking Synthroid. Check TSH.  4 hyperlipidemia-will treat with high-dose statin given newly diagnosed coronary disease  Kirk Ruths MD 10/03/2016, 1:12 PM

## 2016-10-01 NOTE — ED Provider Notes (Signed)
[facility]-EMERGENCY DEPT Provider Note   CSN: [id number] Arrival date & time: 10/01/16  1615     History   Chief Complaint Chief Complaint  Patient presents with  . Code STEMI    pt with CP this afternoon at 1430 now is pain free    HPI Jason [name] is a 83 y.o. male.  Pt found to have STEMI at urgent care   The history is provided by the patient and the EMS personnel.  Illness  This is a new problem. The current episode started 1 to 2 hours ago. The problem occurs constantly. The problem has been gradually improving. Pertinent negatives include no chest pain, no abdominal pain, no headaches and no shortness of breath. Associated symptoms comments: Mid upper back pain . Nothing aggravates the symptoms. The symptoms are relieved by rest (aspirin and rest). He has tried ASA for the symptoms. The treatment provided moderate relief.    Past Medical History:  Diagnosis Date  . Anxiety    takes Diazepam daily prn  . Arthritis   . Constipation   . Depression    takes Trazodone nightly  . Dizziness    was taking Meclizine but doesn't take now;found out that HR was 45  . Enlarged prostate   . GERD (gastroesophageal reflux disease)   . H/O hiatal hernia    takes Omeprazole daily  . Hemorrhoids   . History of colitis    many yrs ago  . History of colon polyps   . History of kidney stones    passed on his own  . Hyperlipidemia    was on medication but has been off for a while  . Insomnia    takes Trazodone nightly  . Insomnia    takes Trazodone nightly  . Itchy skin    scalp and uses a cream  . Joint pain   . Joint swelling   . 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 PONV (postoperative nausea and vomiting)   . Urinary frequency     Patient Active Problem List   Diagnosis Date Noted  . ACS (acute coronary syndrome) (HCC) 10/01/2016  . Dysarthria 06/02/2014  . Gastrostomy in place [facility]) 10/11/2013  . Osteomyelitis (HCC) 08/07/2013  . Acute sinusitis 07/25/2013  .  Osteomyelitis of skull (HCC) 07/25/2013  . Hyperglycemia 07/25/2013  . Normocytic anemia 07/25/2013  . Dysphagia 07/25/2013  . Protein-calorie malnutrition, severe (HCC) 07/02/2013  . Loss of weight 03/24/2013  . GERD (gastroesophageal reflux disease) 03/24/2013    Past Surgical History:  Procedure Laterality Date  . bilateral cataract surgery    . CIRCUMCISION    . COLONOSCOPY    . DIRECT LARYNGOSCOPY N/A 06/26/2013   Procedure: DIRECT LARYNGOSCOPY;  Surgeon: Christia Reading, MD;  Location: [facility] OR;  Service: ENT;  Laterality: N/A;  . EAR CYST EXCISION N/A 04/05/2013   Procedure: CYST REMOVAL;  Surgeon: Melvenia Beam, MD;  Location: [facility] OR;  Service: ENT;  Laterality: N/A;  . ESOPHAGOGASTRODUODENOSCOPY    . ESOPHAGOGASTRODUODENOSCOPY N/A 07/15/2013   Procedure: ESOPHAGOGASTRODUODENOSCOPY (EGD);  Surgeon: Cherylynn Ridges, MD;  Location: [facility] ENDOSCOPY;  Service: General;  Laterality: N/A;  . HEMORRHOIDECTOMY WITH HEMORRHOID BANDING    . HERNIA REPAIR     double  . PEG PLACEMENT N/A 07/15/2013   Procedure: PERCUTANEOUS ENDOSCOPIC GASTROSTOMY (PEG) PLACEMENT;  Surgeon: Cherylynn Ridges, MD;  Location: [facility] ENDOSCOPY;  Service: General;  Laterality: N/A;  . PERIPHERALLY INSERTED CENTRAL CATHETER INSERTION    . right knee arthroscopy    .  SEPTOPLASTY N/A 04/05/2013   Procedure: SEPTOPLASTY;  Surgeon: Melvenia Beam, MD;  Location: [facility] OR;  Service: ENT;  Laterality: N/A;  . SINUS ENDO W/FUSION N/A 07/03/2013   Procedure: ENDOSCOPIC SINUS SURGERY WITH FUSION NAVIGATION with By-Nasopharongeal Biopsy;  Surgeon: Christia Reading, MD;  Location: [facility] OR;  Service: ENT;  Laterality: N/A;  . SINUS EXPLORATION Bilateral 07/11/2013   Procedure:  BEDSIDE  SINUS EXPLORATION ;  Surgeon: Christia Reading, MD;  Location: [facility] OR;  Service: ENT;  Laterality: Bilateral;  . TONSILLECTOMY         Home Medications    Prior to Admission medications   Medication Sig Start Date End Date Taking? Authorizing Provider  cyclobenzaprine  (FLEXERIL) 5 MG tablet Take 1 tablet (5 mg total) by mouth at bedtime. Patient not taking: Reported on 03/10/2015 09/29/14   Tonye Pearson, MD  doxycycline (VIBRA-TABS) 100 MG tablet Take 1 tablet (100 mg total) by mouth 2 (two) times daily. Do not lie down for 1 hour after meds Patient not taking: Reported on 03/10/2015 09/29/14   Tonye Pearson, MD    Family History No family history on file.  Social History Social History  Substance Use Topics  . Smoking status: Former Smoker    Quit date: 07/01/1993  . Smokeless tobacco: Former Neurosurgeon    Types: Chew    Quit date: 12/29/2012     Comment: quit chewing tobacco several months ago and stopped smoking cigars yrs ago  . Alcohol use No     Allergies   Bee pollen; Tetanus toxoids; and Hydrocodone   Review of Systems Review of Systems  Constitutional: Positive for fatigue. Negative for chills and fever.  HENT: Negative for ear pain and sore throat.   Eyes: Negative for pain and visual disturbance.  Respiratory: Negative for cough and shortness of breath.   Cardiovascular: Negative for chest pain and palpitations.  Gastrointestinal: Negative for abdominal pain and vomiting.  Genitourinary: Negative for dysuria and hematuria.  Musculoskeletal: Positive for back pain. Negative for arthralgias.  Skin: Negative for color change and rash.  Neurological: Negative for seizures, syncope and headaches.  All other systems reviewed and are negative.    Physical Exam Updated Vital Signs BP (!) 159/83 (BP Location: Right Arm)   Pulse 72   Temp 97.2 F (36.2 C) (Oral)   Resp 16   Ht  (1.626 m)   Wt 70.3 kg   SpO2 100%   BMI 26.61 kg/m   Physical Exam  Constitutional: He appears well-developed and well-nourished.  HENT:  Head: Normocephalic and atraumatic.  Eyes: Conjunctivae and EOM are normal.  Neck: Neck supple.  Cardiovascular: Normal rate and regular rhythm.   No murmur heard. Pulmonary/Chest: Effort normal and breath  sounds normal. No respiratory distress.  Abdominal: Soft. There is no tenderness.  Musculoskeletal: He exhibits no edema.  Neurological: He is alert.  Skin: Skin is warm and dry.  Psychiatric: He has a normal mood and affect.  Nursing note and vitals reviewed.    ED Treatments / Results  Labs (all labs ordered are listed, but only abnormal results are displayed) Labs Reviewed  I-STAT TROPOININ, ED - Abnormal; Notable for the following:       Result Value   Troponin i, poc 0.19 (*)    All other components within normal limits  I-STAT CHEM 8, ED - Abnormal; Notable for the following:    BUN 21 (*)    Creatinine, Ser 1.40 (*)    Glucose,  Bld 101 (*)    All other components within normal limits  CBC  DIFFERENTIAL  PROTIME-INR  APTT  COMPREHENSIVE METABOLIC PANEL  TROPONIN I  LIPID PANEL  BRAIN NATRIURETIC PEPTIDE  TSH  TROPONIN I  TROPONIN I  HEMOGLOBIN A1C  BRAIN NATRIURETIC PEPTIDE  MAGNESIUM  HEPARIN LEVEL (UNFRACTIONATED)  BASIC METABOLIC PANEL  LIPID PANEL  CBC    EKG  EKG Interpretation None       Radiology No results found.  Procedures Procedures (including critical care time)  Medications Ordered in ED Medications  aspirin chewable tablet 324 mg (not administered)    Or  aspirin suppository 300 mg (not administered)  aspirin EC tablet 81 mg (not administered)  nitroGLYCERIN (NITROSTAT) SL tablet 0.4 mg (not administered)  acetaminophen (TYLENOL) tablet 650 mg (not administered)  ondansetron (ZOFRAN) injection 4 mg (not administered)  nitroGLYCERIN 50 mg in dextrose 5 % 250 mL (0.2 mg/mL) infusion (not administered)  metoprolol tartrate (LOPRESSOR) tablet 12.5 mg (not administered)  atorvastatin (LIPITOR) tablet 80 mg (not administered)  heparin ADULT infusion 100 units/mL (25000 units/280mL sodium chloride 0.45%) (not administered)  heparin injection 4,000 Units (4,000 Units Intravenous Given 10/01/16 1628)  nitroGLYCERIN 50 mg in dextrose 5 %  250 mL (0.2 mg/mL) infusion (5 mcg/min Intravenous New Bag/Given 10/01/16 1629)     Initial Impression / Assessment and Plan / ED Course  I have reviewed the triage vital signs and the nursing notes.  Pertinent labs & imaging results that were available during my care of the patient were reviewed by me and considered in my medical decision making (see chart for details).     83 year old male with no significant past medical history presents in the setting of STEMI. Patient reports his afternoon he started having discomfort between his mid back. Patient went to urgent care where EKG revealed anterior STEMI. Patient given 324 mg of aspirin and stable in route with EMS. Patient reports pain resolved in route without intervention and on arrival patient was found to be hemodynamically stable and afebrile.  Code STEMI was initiated and Dr. Katrinka Blazing cardiology available immediately at bedside. Repeat EKG revealed normalization of EKG at this time believe patient is having improvement with STEMI. Patient started on nitroglycerin infusion and heparin per cardiology recommendations. Troponin was elevated on evaluation and no other significant laboratory abnormalities noted. Patient remained pain-free and will be admitted to the cardiac ICU for further management of this condition. Will likely need cardiac catheterization today. Patient stable at time of transfer of care.  Attending has seen and evaluated the patient and Dr. Erma Heritage is in agreement with plan.  Final Clinical Impressions(s) / ED Diagnoses   Final diagnoses:  ST elevation myocardial infarction (STEMI), unspecified artery [facility])    New Prescriptions New Prescriptions   No medications on file     Stacy Gardner, MD 10/01/16 1739    Shaune Pollack, MD 10/02/16 1219

## 2016-10-01 NOTE — ED Triage Notes (Signed)
Dr. Katrinka Blazing Cardiologist to bedside

## 2016-10-01 NOTE — Progress Notes (Addendum)
ANTICOAGULATION CONSULT NOTE - Initial Consult  Pharmacy Consult for Heparin Indication: chest pain/ACS  Allergies  Allergen Reactions  . Bee Pollen Anaphylaxis  . Tetanus Toxoids Swelling    Tetanus Shot  . Hydrocodone Other (See Comments)    Confusion, "talking out of his head"    Patient Measurements: Height:  (162.6 cm) Weight: 155 lb (70.3 kg) IBW/kg (Calculated) : 59.2 Heparin Dosing Weight: 70.3 kg  Vital Signs: Temp: 97.2 F (36.2 C) (04/07 1624) Temp Source: Oral (04/07 1624) BP: 159/83 (04/07 1624) Pulse Rate: 72 (04/07 1624)  Labs:  Recent Labs  10/01/16 1637  HGB 14.6  HCT 43.0  CREATININE 1.40*    Estimated Creatinine Clearance: 32.9 mL/min (A) (by C-G formula based on SCr of 1.4 mg/dL (H)).   Medical History: Past Medical History:  Diagnosis Date  . Anxiety    takes Diazepam daily prn  . Arthritis   . Constipation   . Depression    takes Trazodone nightly  . Dizziness    was taking Meclizine but doesn't take now;found out that HR was 45  . Enlarged prostate   . GERD (gastroesophageal reflux disease)   . H/O hiatal hernia    takes Omeprazole daily  . Hemorrhoids   . History of colitis    many yrs ago  . History of colon polyps   . History of kidney stones    passed on his own  . Hyperlipidemia    was on medication but has been off for a while  . Insomnia    takes Trazodone nightly  . Insomnia    takes Trazodone nightly  . Itchy skin    scalp and uses a cream  . Joint pain   . Joint swelling   . 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 PONV (postoperative nausea and vomiting)   . Urinary frequency     Medications:   (Not in a hospital admission) Scheduled:  . aspirin  324 mg Oral NOW   Or  . aspirin  300 mg Rectal NOW  . [START ON 10/02/2016] aspirin EC  81 mg Oral Daily  . atorvastatin  80 mg Oral q1800  . metoprolol tartrate  12.5 mg Oral BID   Infusions:  . nitroGLYCERIN      Assessment: 83yo male presents  with CP. Pharmacy is consulted to dose heparin for ACS/chest pain.   Goal of Therapy:  Heparin level 0.3-0.7 units/ml Monitor platelets by anticoagulation protocol: Yes   Plan:  Give 4000 units bolus x 1 Start heparin infusion at 900 units/hr Check anti-Xa level in 8 hours and daily while on heparin Continue to monitor H&H and platelets  Arlean Hopping. Newman Pies, PharmD, BCPS Clinical Pharmacist [phone] 10/01/2016,4:48 PM   Addendum [phone]): RN just called and TR band is still on - hoping to get off by midnight. Sheath removed 1930 in cath lab. Order for heparin 8 hr post sheath removal. Restart heparin 8 hour post sheath removal so 0330.  Christoper Fabian, PharmD, BCPS Clinical pharmacist, pager [phone] 10/01/2016 11:07 PM

## 2016-10-01 NOTE — ED Notes (Addendum)
Jason [name] is a 83 y.o. male  Admit Date: 10/01/2016 Referring Physician: Emergency department Primary Cardiologist:: H Katherine Roan, M.D. Chief complaint / reason for admission: Aborted anterior wall ST elevation MI  HPI: 83 year old gentleman with a one-week history of interscapular discomfort. The initial episode 7 days ago lasted 15 minutes and resolved completely. The episode today started spontaneously while watching the Masters on TV. The interscapular discomfort was severe. He went to an emergency clinic where STEMI activation was started. Total duration of pain proximally one hour. By the time he arrived in the emergency room, the pain had completely resolved and the EKG normalized. Simultaneously a second STEMI activation occurred.  He is currently pain-free. No interscapular back discomfort. Denies dyspnea. No other significant medical problems.  As he was being evaluated in the emergency room to consider the emergency catheterization a second ST elevation myocardial infarction presented to the emergency room with ongoing chest pain.   PMH:    Past Medical History:  Diagnosis Date  . Anxiety    takes Diazepam daily prn  . Arthritis   . Constipation   . Depression    takes Trazodone nightly  . Dizziness    was taking Meclizine but doesn't take now;found out that HR was 45  . Enlarged prostate   . GERD (gastroesophageal reflux disease)   . H/O hiatal hernia    takes Omeprazole daily  . Hemorrhoids   . History of colitis    many yrs ago  . History of colon polyps   . History of kidney stones    passed on his own  . Hyperlipidemia    was on medication but has been off for a while  . Insomnia    takes Trazodone nightly  . Insomnia    takes Trazodone nightly  . Itchy skin    scalp and uses a cream  . Joint pain   . Joint swelling   . 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 PONV (postoperative nausea and vomiting)   . Urinary frequency     PSH:    Past  Surgical History:  Procedure Laterality Date  . bilateral cataract surgery    . CIRCUMCISION    . COLONOSCOPY    . DIRECT LARYNGOSCOPY N/A 06/26/2013   Procedure: DIRECT LARYNGOSCOPY;  Surgeon: Christia Reading, MD;  Location: [facility] OR;  Service: ENT;  Laterality: N/A;  . EAR CYST EXCISION N/A 04/05/2013   Procedure: CYST REMOVAL;  Surgeon: Melvenia Beam, MD;  Location: [facility] OR;  Service: ENT;  Laterality: N/A;  . ESOPHAGOGASTRODUODENOSCOPY    . ESOPHAGOGASTRODUODENOSCOPY N/A 07/15/2013   Procedure: ESOPHAGOGASTRODUODENOSCOPY (EGD);  Surgeon: Cherylynn Ridges, MD;  Location: [facility] ENDOSCOPY;  Service: General;  Laterality: N/A;  . HEMORRHOIDECTOMY WITH HEMORRHOID BANDING    . HERNIA REPAIR     double  . PEG PLACEMENT N/A 07/15/2013   Procedure: PERCUTANEOUS ENDOSCOPIC GASTROSTOMY (PEG) PLACEMENT;  Surgeon: Cherylynn Ridges, MD;  Location: [facility] ENDOSCOPY;  Service: General;  Laterality: N/A;  . PERIPHERALLY INSERTED CENTRAL CATHETER INSERTION    . right knee arthroscopy    . SEPTOPLASTY N/A 04/05/2013   Procedure: SEPTOPLASTY;  Surgeon: Melvenia Beam, MD;  Location: [facility] OR;  Service: ENT;  Laterality: N/A;  . SINUS ENDO W/FUSION N/A 07/03/2013   Procedure: ENDOSCOPIC SINUS SURGERY WITH FUSION NAVIGATION with By-Nasopharongeal Biopsy;  Surgeon: Christia Reading, MD;  Location: [facility] OR;  Service: ENT;  Laterality: N/A;  . SINUS EXPLORATION Bilateral 07/11/2013   Procedure:  BEDSIDE  SINUS EXPLORATION ;  Surgeon: Christia Reading, MD;  Location: [facility] OR;  Service: ENT;  Laterality: Bilateral;  . TONSILLECTOMY     ALLERGIES:   Bee pollen; Tetanus toxoids; and Hydrocodone Prior to Admit Meds:   (Not in a hospital admission) Family HX:   No family history on file. Social HX:    Social History   Social History  . Marital status: Married    Spouse name: N/A  . Number of children: N/A  . Years of education: N/A   Occupational History  . Not on file.   Social History Main Topics  . Smoking status: Former Smoker    Quit  date: 07/01/1993  . Smokeless tobacco: Former Neurosurgeon    Types: Chew    Quit date: 12/29/2012     Comment: quit chewing tobacco several months ago and stopped smoking cigars yrs ago  . Alcohol use No  . Drug use: No  . Sexual activity: No   Other Topics Concern  . Not on file   Social History Narrative  . No narrative on file     ROS: recent diagnosis of hypothyroidism. Stopped taking medication All other systems are negative.  Physical Exam: Blood pressure (!) 159/83, pulse 72, temperature 97.2 F (36.2 C), temperature source Oral, resp. rate 16, height  (1.626 m), weight 155 lb (70.3 kg), SpO2 100 %.   Resting comfortably Skin color is normal and warm. HEENT exam reveals pupils are equal and reactive Neck exam reveals no JVD or carotid bruit Chest is clear to auscultation and percussion Cardiac exam reveals heart tones are distant. A 1/6 systolic murmur is heard at the right upper sternal border Abdomen is soft. No bruits are heard. Extremities reveal no edema. Radial pulses and posterior tibial pulses are 2+ and symmetric Neurological exam is unremarkable.   Labs: Lab Results  Component Value Date   WBC 9.8 09/12/2013   HGB 12.1 (L) 09/12/2013   HCT 34.4 (L) 09/12/2013   MCV 83.1 09/12/2013   PLT 283 09/12/2013   No results for input(s): NA, K, CL, CO2, BUN, CREATININE, CALCIUM, PROT, BILITOT, ALKPHOS, ALT, AST, GLUCOSE in the last 168 hours.  Invalid input(s): LABALBU No results found for: CKTOTAL, CKMB, CKMBINDEX, TROPONINI    Radiology:  No results found.  EKG:  #1 demonstrated ST elevation in limb lead 1, aVL, V2 through V4. This tracing was done in the field.  #2 demonstrated normal sinus rhythm with minimal ST elevation in lead V2. Compared to the initial tracing done in the field, the EKG has essentially normalized.    ASSESSMENT:  1. Spontaneously resolving anterior ST elevation myocardial infarction after greater than one hour of chest discomfort. No  significant arrhythmias, hemodynamic instability or evidence of heart failure.  2. No other significant medical problems  Plan:  I have decided to delay catheterization in Mr. Borg, since he is now pain free with no evidence of ongoing ischemia. We will intervene upon the second acute ST elevation myocardial infarction that presented simultaneously to the ER. That patient is having ongoing severe chest pain.  I have spoken to Mr. and Mrs. Halladay, explaining the treatment dilemma and decision. They are very gracious and understanding. They voiced approval of the plan..   Plan is to perform angiography on Mr. Levario as soon as possible today.  Critical care time 32 minutes  Lyn Records III 10/01/2016 4:36 PM

## 2016-10-01 NOTE — ED Triage Notes (Signed)
At 1430 began to have CP went to [facility] showed STEMI EMS, ekg by EMS also showed some elevation pt  4 baby ASA given pt denies any CP at this time

## 2016-10-01 NOTE — Progress Notes (Signed)
Patient ID: Jason [name], male   DOB: Sep 03, 1929, 83 y.o.   MRN: [id number]      [address]       [place] [address]             [phone]        Jason [name] [place] Medical Record #[id number] Date of Birth: [birth year]  Referring:Dr Verdis Prime Primary Care: Minda Meo, MD  Chief Complaint:    Chief Complaint  Patient presents with  . Code STEMI        History of Present Illness:     Patient is 83 year old gentleman who started having  interscapular discomfort one week ago . The initial episode 7 days ago lasted 15 minutes and resolved completely. Last night  Recurrent episode occurred  Starting  spontaneously while watching the Masters on TV. He noted the back pain was more severe and not relived by getting up and walking around . He went to an emergency clinic where STEMI activation was started. Total duration of pain proximally one hour. By the time he arrived in the emergency room, the pain had completely resolved and the EKG normalized. He was loaded with Brilinta 180 mg and went to cath lab last night.   He is currently pain-free. No interscapular back discomfort. Denies dyspnea.   Patient has history of chronic kidney disease    3 years ago He had  peg tube and treatment for 08-02-13 showing: Destructive mass of the left skullbase consistent with osteomyelitis. There is erosion of the hypoglossal canal on the left, consistent with osteomyelitis and hypoglossal neuropathy.There is destruction of bone around the carotid canal on the left raising the possibility of carotid infection. He was unable to swallow for a month, with antbiotics he gradually improved. Now notes he is able to do yard work . He denies any difficulty swallowing or eating.    Current Activity/ Functional Status: Patient is independent with mobility/ambulation, transfers, ADL's, IADL's.   Zubrod Score: At the time of surgery this patient's most appropriate activity  status/level should be described as:     0    Normal activity, no symptoms     1    Restricted in physical strenuous activity but ambulatory, able to do out light work     2    Ambulatory and capable of self care, unable to do work activities, up and about                 more than 50%  Of the time                                3    Only limited self care, in bed greater than 50% of waking hours     4    Completely disabled, no self care, confined to bed or chair     5    Moribund  Past Medical History:  Diagnosis Date  . Anxiety    takes Diazepam daily prn  . Arthritis   . Constipation   . Depression    takes Trazodone nightly  . Enlarged prostate   . GERD (gastroesophageal reflux disease)   . H/O hiatal hernia    takes Omeprazole daily  . Hemorrhoids   . History of colitis    many yrs ago  . History of colon polyps   . History of kidney stones  passed on his own  . Hyperlipidemia    was on medication but has been off for a while  . Insomnia    takes Trazodone nightly  . 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 Urinary frequency     Past Surgical History:  Procedure Laterality Date  . bilateral cataract surgery    . CIRCUMCISION    . COLONOSCOPY    . DIRECT LARYNGOSCOPY N/A 06/26/2013   Procedure: DIRECT LARYNGOSCOPY;  Surgeon: Christia Reading, MD;  Location: [facility] OR;  Service: ENT;  Laterality: N/A;  . EAR CYST EXCISION N/A 04/05/2013   Procedure: CYST REMOVAL;  Surgeon: Melvenia Beam, MD;  Location: [facility] OR;  Service: ENT;  Laterality: N/A;  . ESOPHAGOGASTRODUODENOSCOPY    . ESOPHAGOGASTRODUODENOSCOPY N/A 07/15/2013   Procedure: ESOPHAGOGASTRODUODENOSCOPY (EGD);  Surgeon: Cherylynn Ridges, MD;  Location: [facility] ENDOSCOPY;  Service: General;  Laterality: N/A;  . HEMORRHOIDECTOMY WITH HEMORRHOID BANDING    . HERNIA REPAIR     double  . PEG PLACEMENT N/A 07/15/2013   Procedure: PERCUTANEOUS ENDOSCOPIC GASTROSTOMY (PEG) PLACEMENT;  Surgeon: Cherylynn Ridges, MD;  Location:  [facility] ENDOSCOPY;  Service: General;  Laterality: N/A;  . PERIPHERALLY INSERTED CENTRAL CATHETER INSERTION    . right knee arthroscopy    . SEPTOPLASTY N/A 04/05/2013   Procedure: SEPTOPLASTY;  Surgeon: Melvenia Beam, MD;  Location: [facility] OR;  Service: ENT;  Laterality: N/A;  . SINUS ENDO W/FUSION N/A 07/03/2013   Procedure: ENDOSCOPIC SINUS SURGERY WITH FUSION NAVIGATION with By-Nasopharongeal Biopsy;  Surgeon: Christia Reading, MD;  Location: [facility] OR;  Service: ENT;  Laterality: N/A;  . SINUS EXPLORATION Bilateral 07/11/2013   Procedure:  BEDSIDE  SINUS EXPLORATION ;  Surgeon: Christia Reading, MD;  Location: [facility] OR;  Service: ENT;  Laterality: Bilateral;  . TONSILLECTOMY      History  Smoking Status  . Former Smoker  . Quit date: 07/01/1993  Smokeless Tobacco  . Former Neurosurgeon  . Types: Chew  . Quit date: 12/29/2012    Comment: quit chewing tobacco several months ago and stopped smoking cigars yrs ago    History  Alcohol Use No    Social History   Social History  . Marital status: Married    Spouse name: N/A  . Number of children: N/A  . Years of education: N/A   Occupational History  . Not on file.   Social History Main Topics  . Smoking status: Former Smoker    Quit date: 07/01/1993  . Smokeless tobacco: Former Neurosurgeon    Types: Chew    Quit date: 12/29/2012     Comment: quit chewing tobacco several months ago and stopped smoking cigars yrs ago  . Alcohol use No  . Drug use: No  . Sexual activity: No   Other Topics Concern  . Not on file   Social History Narrative  . No narrative on file    Allergies  Allergen Reactions  . Bee Pollen Anaphylaxis  . Tetanus Toxoids Swelling    Tetanus Shot  . Hydrocodone Other (See Comments)    Confusion, "talking out of his head"    Current Facility-Administered Medications  Medication Dose Route Frequency Provider Last Rate Last Dose  . 0.9 %  sodium chloride infusion  250 mL Intravenous PRN Lyn Records, MD 10 mL/hr at 10/02/16 0700 250 mL at  10/02/16 0700  . 0.9% sodium chloride infusion  1 mL/kg/hr Intravenous Continuous Lyn Records, MD   Stopped at 10/02/16 0400  . acetaminophen (TYLENOL)  tablet 650 mg  650 mg Oral Q4H PRN Lyn Records, MD      . aspirin chewable tablet 324 mg  324 mg Oral NOW Lyn Records, MD       Or  . aspirin suppository 300 mg  300 mg Rectal NOW Lyn Records, MD      . aspirin chewable tablet 81 mg  81 mg Oral Daily Lyn Records, MD   81 mg at 10/02/16 1109  . atorvastatin (LIPITOR) tablet 80 mg  80 mg Oral q1800 Lyn Records, MD      . heparin ADULT infusion 100 units/mL (25000 units/216mL sodium chloride 0.45%)  900 Units/hr Intravenous Continuous Titus Mould, RPH 9 mL/hr at 10/02/16 0700 900 Units/hr at 10/02/16 0700  . metoprolol tartrate (LOPRESSOR) tablet 12.5 mg  12.5 mg Oral BID Lyn Records, MD   12.5 mg at 10/02/16 1109  . nitroGLYCERIN (NITROSTAT) SL tablet 0.4 mg  0.4 mg Sublingual Q5 Min x 3 PRN Lyn Records, MD      . nitroGLYCERIN 50 mg in dextrose 5 % 250 mL (0.2 mg/mL) infusion  0-30 mcg/min Intravenous Titrated Lyn Records, MD 1.5 mL/hr at 10/02/16 1111 5 mcg/min at 10/02/16 1111  . ondansetron (ZOFRAN) injection 4 mg  4 mg Intravenous Q6H PRN Lyn Records, MD      . sodium chloride flush (NS) 0.9 % injection 3 mL  3 mL Intravenous Q12H Lyn Records, MD      . sodium chloride flush (NS) 0.9 % injection 3 mL  3 mL Intravenous PRN Lyn Records, MD        Prescriptions Prior to Admission  Medication Sig Dispense Refill Last Dose  . cyclobenzaprine (FLEXERIL) 5 MG tablet Take 1 tablet (5 mg total) by mouth at bedtime. (Patient not taking: Reported on 03/10/2015) 10 tablet 0 Not Taking at Unknown time  . doxycycline (VIBRA-TABS) 100 MG tablet Take 1 tablet (100 mg total) by mouth 2 (two) times daily. Do not lie down for 1 hour after meds (Patient not taking: Reported on 03/10/2015) 14 tablet 0 Not Taking at Unknown time    Family History  Problem Relation Age of Onset  . CAD  Father      Review of Systems:      Cardiac Review of Systems: Y or N  Chest Pain [  y  ]  Resting SOB [ n  ] Exertional SOB  [n  ]  Orthopnea [n  ]   Pedal Edema [ n  ]    Palpitations [n  ] Syncope  [ n ]   Presyncope [ n  ]  General Review of Systems: [Y] = yes [  ]=no Constitional: recent weight change [ n ]; anorexia [  ]; fatigue [  n]; nausea [  ]; night sweats [  ]; fever [  ]; or chills [  ]                                                               Dental: poor dentition[  ]; Last Dentist visit:   Eye : blurred vision [  ]; diplopia [   ]; vision changes [  ];  Amaurosis fugax[  ]; Resp: cough [  n  ];  wheezing[ n ];  hemoptysis[ n ]; shortness of breath[n  ]; paroxysmal nocturnal dyspnea[  ]; dyspnea on exertion[ n ]; or orthopnea[  ];  GI:  gallstones[  ], vomiting[  ];  dysphagia[  ]; melena[  ];  hematochezia [  ]; heartburn[  ];   Hx of  Colonoscopy[  ]; GU: kidney stones [ n ]; hematuria[n  ];   dysuria [n  ];  nocturia[ n ];  history of     obstruction [ n ]; urinary frequency [ y ]             Skin: rash, swelling[  ];, hair loss[  ];  peripheral edema[  ];  or itching[  ]; Musculosketetal: myalgias[n  ];  joint swelling[  ];  joint erythema[  ];  joint pain[  ];  back pain[  ];  Heme/Lymph: bruising[  ];  bleeding[  ];  anemia[  ];  Neuro: TIA[  ];  headaches[  ];  stroke[ n ];  vertigo[ n ];  seizures[n  ];   paresthesias[ n ];  difficulty walking[ n ];  Psych:depression[  ]; anxiety[  ];  Endocrine: diabetes[ n ];  thyroid dysfunction[y  ];  Immunizations: Flu [  ]; Pneumococcal[  ];  Other:  Physical Exam: BP 112/60 (BP Location: Left Arm)   Pulse (!) 50   Temp 97.5 F (36.4 C) (Oral)   Resp (!) 27   Ht  (1.626 m)   Wt 152 lb 1.9 oz (69 kg)   SpO2 96%   BMI 26.11 kg/m    General appearance: alert, cooperative, appears stated age and no distress Head: Normocephalic, without obvious abnormality, atraumatic Neck: no adenopathy, no carotid bruit, no  JVD, supple, symmetrical, trachea midline and thyroid not enlarged, symmetric, no tenderness/mass/nodules Lymph nodes: Cervical, supraclavicular, and axillary nodes normal. Resp: clear to auscultation bilaterally Back: symmetric, no curvature. ROM normal. No CVA tenderness. Cardio: regular rate and rhythm, S1, S2 normal, no murmur, click, rub or gallop GI: soft, non-tender; bowel sounds normal; no masses,  no organomegaly, healed site of PEG left upper quadrant  Extremities: extremities normal, atraumatic, no cyanosis or edema and Homans sign is negative, no sign of DVT Neurologic: Grossly normal Palpable dp and pt pulses bilaterial  Diagnostic Studies & Laboratory data:     Recent Radiology Findings:   Dg Chest Portable 1 View  Result Date: 10/01/2016 CLINICAL DATA:  Myocardial infarction EXAM: PORTABLE CHEST 1 VIEW COMPARISON:  August 13, 2014 FINDINGS: The heart size and mediastinal contours are within normal limits. Both lungs are clear. The visualized skeletal structures are unremarkable. IMPRESSION: No active disease. Electronically Signed   By: Gerome Sam III M.D   On: 10/01/2016 17:21     I have independently reviewed the above radiologic studies.  Recent Lab Findings: Lab Results  Component Value Date   WBC 8.7 10/02/2016   HGB 12.7 (L) 10/02/2016   HCT 37.7 (L) 10/02/2016   PLT 142 (L) 10/02/2016   GLUCOSE 95 10/02/2016   CHOL 173 10/02/2016   TRIG 139 10/02/2016   HDL 42 10/02/2016   LDLCALC 103 (H) 10/02/2016   ALT 19 10/01/2016   AST 30 10/01/2016   NA 139 10/02/2016   K 3.9 10/02/2016   CL 106 10/02/2016   CREATININE 1.08 10/02/2016   BUN 13 10/02/2016   CO2 23 10/02/2016   TSH 6.063 (H) 10/01/2016   INR 0.99 10/01/2016   CATH:  180 mg of Brilinta given Conclusion    Acute coronary syndrome initially presenting with anterior ST elevation, spontaneously resolving by arrival in the emergency room.  Greater than 50% ostial left main associated with  catheter damping and bradycardia with engagement and contrast injection.  90+ percent mid LAD, thrombus filled culprit lesion.  90% ostial large branching obtuse marginal. Her complex is dominant giving the PDA.  Normal, widely patent nondominant RCA.  Left ventricular dysfunction with mid anterior wall to the apical akinesis, EF 35-45%. The regional wall motion abnormality is due to stunned muscle from transient LAD occlusion.  RECOMMENDATIONS:   TCTS consultation for consideration of elective bypass surgery prior to discharge.  IV heparin and nitroglycerin.  A loading dose of Brilinta was used before the decision to consider surgery was made.    Hemo Data    Most Recent Value  AO Systolic Pressure 123 mmHg  AO Diastolic Pressure 59 mmHg  AO Mean 87 mmHg  LV Systolic Pressure 114 mmHg  LV Diastolic Pressure 7 mmHg  LV EDP 18 mmHg  Arterial Occlusion Pressure Extended Systolic Pressure 117 mmHg  Arterial Occlusion Pressure Extended Diastolic Pressure 54 mmHg  Arterial Occlusion Pressure Extended Mean Pressure 82 mmHg  Left Ventricular Apex Extended Systolic Pressure 114 mmHg  Left Ventricular Apex Extended Diastolic Pressure 10 mmHg  Left Ventricular Apex Extended EDP Pressure 21 mmHg   Chronic Kidney Disease   Stage I     GFR >90  Stage II    GFR 60-89  Stage IIIA GFR 45-59  Stage IIIB GFR 30-44  Stage IV   GFR 15-29  Stage V    GFR  <15  Lab Results  Component Value Date   CREATININE 1.08 10/02/2016   Estimated Creatinine Clearance: 42.6 mL/min (by C-G formula based on SCr of 1.08 mg/dL).  Lab Results  Component Value Date   TROPONINI 47.50 (HH) 10/02/2016    Assessment / Plan:   Acute coronary syndrome initially presenting with anterior ST elevation  Anterior MI - troponin elevated 47.5 STAGE IIIB ckd- monitor renal function post cath  Elevated TSH Loaded with Brilinta - P2Y12 low today repeat in am Follow up echo to evaluate   LV  function in am  I  have discussed with the patient the recommendation for CABG with left main and high grade lad disease - Will need bypass this admission  I  spent 40 minutes counseling the patient face to face and 50% or more the  time was spent in counseling and coordination of care. The total time spent in the appointment was 60 minutes.    [name] MD      [address] [place] [place] [address] Office [phone]   Beeper [phone]  10/02/2016 2:47 PM

## 2016-10-02 ENCOUNTER — Inpatient Hospital Stay (HOSPITAL_COMMUNITY): Payer: Medicare Other

## 2016-10-02 DIAGNOSIS — Z01810 Encounter for preprocedural cardiovascular examination: Secondary | ICD-10-CM

## 2016-10-02 DIAGNOSIS — I213 ST elevation (STEMI) myocardial infarction of unspecified site: Secondary | ICD-10-CM

## 2016-10-02 DIAGNOSIS — N183 Chronic kidney disease, stage 3 (moderate): Secondary | ICD-10-CM

## 2016-10-02 DIAGNOSIS — I25110 Atherosclerotic heart disease of native coronary artery with unstable angina pectoris: Secondary | ICD-10-CM

## 2016-10-02 LAB — LIPID PANEL
CHOL/HDL RATIO: 4.1 ratio
CHOLESTEROL: 173 mg/dL (ref 0–200)
HDL: 42 mg/dL (ref >40)
LDL Cholesterol: 103 mg/dL — ABNORMAL HIGH (ref 0–99)
Triglycerides: 139 mg/dL (ref <150)
VLDL: 28 mg/dL (ref 0–40)

## 2016-10-02 LAB — BASIC METABOLIC PANEL
Anion gap: 10 (ref 5–15)
BUN: 13 mg/dL (ref 6–20)
CALCIUM: 8.5 mg/dL — AB (ref 8.9–10.3)
CO2: 23 mmol/L (ref 22–32)
CREATININE: 1.08 mg/dL (ref 0.61–1.24)
Chloride: 106 mmol/L (ref 101–111)
GFR calc Af Amer: >60 mL/min (ref >60)
GLUCOSE: 95 mg/dL (ref 65–99)
POTASSIUM: 3.9 mmol/L (ref 3.5–5.1)
SODIUM: 139 mmol/L (ref 135–145)

## 2016-10-02 LAB — CBC
HEMATOCRIT: 37.7 % — AB (ref 39.0–52.0)
HEMOGLOBIN: 12.7 g/dL — AB (ref 13.0–17.0)
MCH: 29.8 pg (ref 26.0–34.0)
MCHC: 33.7 g/dL (ref 30.0–36.0)
MCV: 88.5 fL (ref 78.0–100.0)
Platelets: 142 10*3/uL — ABNORMAL LOW (ref 150–400)
RBC: 4.26 MIL/uL (ref 4.22–5.81)
RDW: 13.7 % (ref 11.5–15.5)
WBC: 8.7 10*3/uL (ref 4.0–10.5)

## 2016-10-02 LAB — PLATELET INHIBITION P2Y12: Platelet Function  P2Y12: 159 [PRU] — ABNORMAL LOW (ref 194–418)

## 2016-10-02 LAB — HEPARIN LEVEL (UNFRACTIONATED): HEPARIN UNFRACTIONATED: 0.42 [IU]/mL (ref 0.30–0.70)

## 2016-10-02 LAB — TROPONIN I: TROPONIN I: 47.5 ng/mL — AB (ref <0.03)

## 2016-10-02 MED ORDER — ZOLPIDEM TARTRATE 5 MG PO TABS
2.5000 mg | ORAL_TABLET | Freq: Once | ORAL | Status: AC
  Administered 2016-10-02: 2.5 mg via ORAL
  Filled 2016-10-02: qty 1

## 2016-10-02 NOTE — Progress Notes (Signed)
Pre-op Cardiac Surgery  Carotid Findings:  1-39% ICA stenosis. Vertebral artery flow is antegrade.   Upper Extremity Right Left  Brachial Pressures Restricted T 107T  Radial Waveforms T T  Ulnar Waveforms T T  Palmar Arch (Allen's Test) Doppler signal remains normal with radial compression and obliterates with ulnar compression WNL   Findings:      Lower  Extremity Right Left  Dorsalis Pedis    Anterior Tibial T T  Posterior Tibial T T  Ankle/Brachial Indices      Findings:

## 2016-10-02 NOTE — Progress Notes (Signed)
Progress Note  Patient Name: Jason [name] Date of Encounter: 10/02/2016  Primary Cardiologist: Dr Jens Som  Subjective   No chest pain or dyspnea  Inpatient Medications    Scheduled Meds: . aspirin  324 mg Oral NOW   Or  . aspirin  300 mg Rectal NOW  . aspirin  81 mg Oral Daily  . atorvastatin  80 mg Oral q1800  . metoprolol tartrate  12.5 mg Oral BID  . sodium chloride flush  3 mL Intravenous Q12H   Continuous Infusions: . sodium chloride Stopped (10/02/16 0400)  . heparin 900 Units/hr (10/02/16 0330)  . nitroGLYCERIN 10 mcg/min (10/01/16 1945)   PRN Meds: sodium chloride, acetaminophen, nitroGLYCERIN, ondansetron (ZOFRAN) IV, sodium chloride flush   Vital Signs    Vitals:   10/02/16 0530 10/02/16 0600 10/02/16 0630 10/02/16 0700  BP: 114/67 123/62 114/70 (!) 106/56  Pulse: (!) 59 (!) 57 (!) 54 (!) 52  Resp: 18 15 (!) 22 (!) 21  Temp:      TempSrc:      SpO2: 97% 96% 97% 96%  Weight:      Height:        Intake/Output Summary (Last 24 hours) at 10/02/16 0724 Last data filed at 10/02/16 0700  Gross per 24 hour  Intake           777.71 ml  Output              750 ml  Net            27.71 ml   Filed Weights   10/01/16 1621 10/01/16 1730  Weight: 155 lb (70.3 kg) 152 lb 1.9 oz (69 kg)    Telemetry    Sinus with PVCs- Personally Reviewed   Physical Exam   GEN: No acute distress.   Neck: No JVD Cardiac: RRR, no murmurs, rubs, or gallops.  Respiratory: CTA GI: Soft, nontender, non-distended  MS: No edema, Radial cath site with no hematoma Neuro:  Nonfocal  Psych: Normal affect   Labs    Chemistry Recent Labs Lab 10/01/16 1626 10/01/16 1637 10/02/16 0455  NA 139 140 139  K 4.6 4.8 3.9  CL 102 102 106  CO2 28  --  23  GLUCOSE 100* 101* 95  BUN 16 21* 13  CREATININE 1.30* 1.40* 1.08  CALCIUM 9.3  --  8.5*  PROT 6.8  --   --   ALBUMIN 3.7  --   --   AST 30  --   --   ALT 19  --   --   ALKPHOS 67  --   --   BILITOT 1.3*  --   --     GFRNONAA 49*  --  >60  GFRAA 56*  --  >60  ANIONGAP 9  --  10     Hematology Recent Labs Lab 10/01/16 1626 10/01/16 1637 10/02/16 0455  WBC 9.3  --  8.7  RBC 4.85  --  4.26  HGB 14.6 14.6 12.7*  HCT 43.5 43.0 37.7*  MCV 89.7  --  88.5  MCH 30.1  --  29.8  MCHC 33.6  --  33.7  RDW 13.6  --  13.7  PLT 156  --  142*    Cardiac Enzymes Recent Labs Lab 10/01/16 1626 10/01/16 1950 10/02/16 0455  TROPONINI 0.42* 28.46* 47.50*    Recent Labs Lab 10/01/16 1635  TROPIPOC 0.19*     BNP Recent Labs Lab 10/01/16 1627 10/01/16 1950  BNP 59.7 82.7      Radiology    Dg Chest Portable 1 View  Result Date: 10/01/2016 CLINICAL DATA:  Myocardial infarction EXAM: PORTABLE CHEST 1 VIEW COMPARISON:  August 13, 2014 FINDINGS: The heart size and mediastinal contours are within normal limits. Both lungs are clear. The visualized skeletal structures are unremarkable. IMPRESSION: No active disease. Electronically Signed   By: Gerome Sam III M.D   On: 10/01/2016 17:21     Patient Profile     83 y.o. male presented with back pain and transient anterior ST elevation. Cardiac catheterization revealed greater than 50% left main and 90% LAD with thrombus. 90% OM: EF 35-45 For surgical consultation for coronary artery bypass and graft.  Assessment & Plan    1 status post myocardial infarction-patient remains pain-free this morning. Catheterization as outlined above. We will await CVTS consult for coronary artery bypass and graft. Continue aspirin, heparin, statin, metoprolol and IV nitroglycerin.  2 ischemic cardiomyopathy-continue low-dose metoprolol. Add ACE inhibitor later if blood pressure allows.  3 chronic stage III kidney disease-renal function is stable this morning. Recheck tomorrow morning.  4 hypothyroidism-TSH mildly elevated. Check free T4.  5 hyperlipidemia-continue Lipitor. Check lipids and liver in 4 weeks.  Signed, Olga Millers, MD  10/02/2016, 7:24 AM

## 2016-10-02 NOTE — Progress Notes (Signed)
ANTICOAGULATION CONSULT NOTE - Follow Up Consult  Pharmacy Consult for Heparin Indication: chest pain/ACS  Allergies  Allergen Reactions  . Bee Pollen Anaphylaxis  . Tetanus Toxoids Swelling    Tetanus Shot  . Hydrocodone Other (See Comments)    Confusion, "talking out of his head"    Patient Measurements: Height:  (162.6 cm) Weight: 152 lb 1.9 oz (69 kg) IBW/kg (Calculated) : 59.2 Heparin Dosing Weight: 70.3 kg  Vital Signs: Temp: 97.9 F (36.6 C) (04/08 1651) Temp Source: Oral (04/08 1651) BP: 107/60 (04/08 1700) Pulse Rate: 64 (04/08 1700)  Labs:  Recent Labs  10/01/16 1626 10/01/16 1637 10/01/16 1950 10/02/16 0455 10/02/16 1147  HGB 14.6 14.6  --  12.7*  --   HCT 43.5 43.0  --  37.7*  --   PLT 156  --   --  142*  --   APTT 32  --   --   --   --   LABPROT 13.1  --   --   --   --   INR 0.99  --   --   --   --   HEPARINUNFRC  --   --   --   --  0.42  CREATININE 1.30* 1.40*  --  1.08  --   TROPONINI 0.42*  --  28.46* 47.50*  --     Estimated Creatinine Clearance: 42.6 mL/min (by C-G formula based on SCr of 1.08 mg/dL).   Medical History: Past Medical History:  Diagnosis Date  . Anxiety    takes Diazepam daily prn  . Arthritis   . Constipation   . Depression    takes Trazodone nightly  . Enlarged prostate   . GERD (gastroesophageal reflux disease)   . H/O hiatal hernia    takes Omeprazole daily  . Hemorrhoids   . History of colitis    many yrs ago  . History of colon polyps   . History of kidney stones    passed on his own  . Hyperlipidemia    was on medication but has been off for a while  . Insomnia    takes Trazodone nightly  . 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 Urinary frequency     Medications:  Prescriptions Prior to Admission  Medication Sig Dispense Refill Last Dose  . cyclobenzaprine (FLEXERIL) 5 MG tablet Take 1 tablet (5 mg total) by mouth at bedtime. (Patient not taking: Reported on 03/10/2015) 10 tablet 0 Not Taking  at Unknown time  . doxycycline (VIBRA-TABS) 100 MG tablet Take 1 tablet (100 mg total) by mouth 2 (two) times daily. Do not lie down for 1 hour after meds (Patient not taking: Reported on 03/10/2015) 14 tablet 0 Not Taking at Unknown time   Scheduled:  . aspirin  81 mg Oral Daily  . atorvastatin  80 mg Oral q1800  . metoprolol tartrate  12.5 mg Oral BID  . sodium chloride flush  3 mL Intravenous Q12H   Infusions:  . sodium chloride Stopped (10/02/16 0400)  . heparin 900 Units/hr (10/02/16 0700)  . nitroGLYCERIN 5 mcg/min (10/02/16 1111)    Assessment: 83yo male presents with CP. Pharmacy is consulted to dose heparin for ACS/chest pain. Went to cath lab and heparin to restart post cath> TCTS consult Sheath removed 1930 in cath lab. Order for heparin 8 hr post sheath removal. Restart heparin 8 hour post sheath removal so 0330. Heparin drip 900 uts/hr HL 0.42 at goal   Goal of Therapy:  Heparin level 0.3-0.7 units/ml Monitor platelets by anticoagulation protocol: Yes   Plan:  Heparin drip 900 uts/hr  Daily HL, CBC  Leota Sauers Pharm.D. CPP, BCPS Clinical Pharmacist [phone] 10/02/2016 5:38 PM

## 2016-10-02 NOTE — Progress Notes (Signed)
Candace, vascular tech, notified of pre-CABG order.  Will be completed today. [place], Mitzi Hansen

## 2016-10-03 ENCOUNTER — Encounter (HOSPITAL_COMMUNITY): Payer: Self-pay | Admitting: Interventional Cardiology

## 2016-10-03 ENCOUNTER — Inpatient Hospital Stay (HOSPITAL_COMMUNITY): Payer: Medicare Other

## 2016-10-03 ENCOUNTER — Other Ambulatory Visit: Payer: Self-pay | Admitting: *Deleted

## 2016-10-03 DIAGNOSIS — I2510 Atherosclerotic heart disease of native coronary artery without angina pectoris: Secondary | ICD-10-CM

## 2016-10-03 DIAGNOSIS — I2102 ST elevation (STEMI) myocardial infarction involving left anterior descending coronary artery: Principal | ICD-10-CM

## 2016-10-03 DIAGNOSIS — R079 Chest pain, unspecified: Secondary | ICD-10-CM

## 2016-10-03 LAB — COMPREHENSIVE METABOLIC PANEL
ALT: 25 U/L (ref 17–63)
AST: 73 U/L — ABNORMAL HIGH (ref 15–41)
Albumin: 3.2 g/dL — ABNORMAL LOW (ref 3.5–5.0)
Alkaline Phosphatase: 59 U/L (ref 38–126)
Anion gap: 9 (ref 5–15)
BUN: 13 mg/dL (ref 6–20)
CO2: 22 mmol/L (ref 22–32)
Calcium: 8.5 mg/dL — ABNORMAL LOW (ref 8.9–10.3)
Chloride: 107 mmol/L (ref 101–111)
Creatinine, Ser: 1.13 mg/dL (ref 0.61–1.24)
GFR calc Af Amer: >60 mL/min (ref >60)
GFR calc non Af Amer: 58 mL/min — ABNORMAL LOW (ref >60)
Glucose, Bld: 99 mg/dL (ref 65–99)
Potassium: 3.7 mmol/L (ref 3.5–5.1)
Sodium: 138 mmol/L (ref 135–145)
Total Bilirubin: 1.8 mg/dL — ABNORMAL HIGH (ref 0.3–1.2)
Total Protein: 5.8 g/dL — ABNORMAL LOW (ref 6.5–8.1)

## 2016-10-03 LAB — CBC
HEMATOCRIT: 38.2 % — AB (ref 39.0–52.0)
Hemoglobin: 12.9 g/dL — ABNORMAL LOW (ref 13.0–17.0)
MCH: 29.9 pg (ref 26.0–34.0)
MCHC: 33.8 g/dL (ref 30.0–36.0)
MCV: 88.4 fL (ref 78.0–100.0)
PLATELETS: 150 10*3/uL (ref 150–400)
RBC: 4.32 MIL/uL (ref 4.22–5.81)
RDW: 13.8 % (ref 11.5–15.5)
WBC: 9.9 10*3/uL (ref 4.0–10.5)

## 2016-10-03 LAB — ECHOCARDIOGRAM COMPLETE
Height: 64 in
Weight: 2433.88 oz

## 2016-10-03 LAB — HEMOGLOBIN A1C
Hgb A1c MFr Bld: 5.3 % (ref 4.8–5.6)
MEAN PLASMA GLUCOSE: 105 mg/dL

## 2016-10-03 LAB — BRAIN NATRIURETIC PEPTIDE: B Natriuretic Peptide: 269.4 pg/mL — ABNORMAL HIGH (ref 0.0–100.0)

## 2016-10-03 LAB — HEPARIN LEVEL (UNFRACTIONATED): Heparin Unfractionated: 0.53 IU/mL (ref 0.30–0.70)

## 2016-10-03 LAB — PLATELET INHIBITION P2Y12: Platelet Function  P2Y12: 197 [PRU] (ref 194–418)

## 2016-10-03 LAB — POCT ACTIVATED CLOTTING TIME: Activated Clotting Time: 268 seconds

## 2016-10-03 MED ORDER — ZOLPIDEM TARTRATE 5 MG PO TABS
2.5000 mg | ORAL_TABLET | Freq: Every evening | ORAL | Status: DC | PRN
  Administered 2016-10-03 – 2016-10-06 (×4): 2.5 mg via ORAL
  Filled 2016-10-03 (×4): qty 1

## 2016-10-03 MED FILL — Nitroglycerin IV Soln 100 MCG/ML in D5W: INTRA_ARTERIAL | Qty: 10 | Status: AC

## 2016-10-03 NOTE — Progress Notes (Signed)
ANTICOAGULATION CONSULT NOTE - Follow Up Consult  Pharmacy Consult for Heparin Indication: chest pain/ACS  Allergies  Allergen Reactions  . Bee Pollen Anaphylaxis  . Tetanus Toxoids Swelling    Tetanus Shot  . Hydrocodone Other (See Comments)    Confusion, "talking out of his head"    Patient Measurements: Height:  (162.6 cm) Weight: 152 lb 1.9 oz (69 kg) IBW/kg (Calculated) : 59.2 Heparin Dosing Weight: 70.3 kg  Vital Signs: Temp: 98 F (36.7 C) (04/09 0751) Temp Source: Oral (04/09 0751) BP: 140/82 (04/09 0900) Pulse Rate: 69 (04/09 0900)  Labs:  Recent Labs  10/01/16 1626 10/01/16 1637 10/01/16 1950 10/02/16 0455 10/02/16 1147 10/03/16 0159  HGB 14.6 14.6  --  12.7*  --  12.9*  HCT 43.5 43.0  --  37.7*  --  38.2*  PLT 156  --   --  142*  --  150  APTT 32  --   --   --   --   --   LABPROT 13.1  --   --   --   --   --   INR 0.99  --   --   --   --   --   HEPARINUNFRC  --   --   --   --  0.42 0.53  CREATININE 1.30* 1.40*  --  1.08  --  1.13  TROPONINI 0.42*  --  28.46* 47.50*  --   --     Estimated Creatinine Clearance: 40.7 mL/min (by C-G formula based on SCr of 1.13 mg/dL).   Medical History: Past Medical History:  Diagnosis Date  . Anxiety    takes Diazepam daily prn  . Arthritis   . Constipation   . Depression    takes Trazodone nightly  . Enlarged prostate   . GERD (gastroesophageal reflux disease)   . H/O hiatal hernia    takes Omeprazole daily  . Hemorrhoids   . History of colitis    many yrs ago  . History of colon polyps   . History of kidney stones    passed on his own  . Hyperlipidemia    was on medication but has been off for a while  . Insomnia    takes Trazodone nightly  . 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 Urinary frequency     Medications:  Prescriptions Prior to Admission  Medication Sig Dispense Refill Last Dose  . cyclobenzaprine (FLEXERIL) 5 MG tablet Take 1 tablet (5 mg total) by mouth at bedtime. (Patient  not taking: Reported on 03/10/2015) 10 tablet 0 Not Taking at Unknown time  . doxycycline (VIBRA-TABS) 100 MG tablet Take 1 tablet (100 mg total) by mouth 2 (two) times daily. Do not lie down for 1 hour after meds (Patient not taking: Reported on 03/10/2015) 14 tablet 0 Not Taking at Unknown time   Scheduled:  . aspirin  81 mg Oral Daily  . atorvastatin  80 mg Oral q1800  . metoprolol tartrate  12.5 mg Oral BID  . sodium chloride flush  3 mL Intravenous Q12H   Infusions:  . heparin 900 Units/hr (10/03/16 0800)  . nitroGLYCERIN 5 mcg/min (10/03/16 0800)    Assessment: 83yo male presents with CP. Pharmacy is consulted to dose heparin for ACS/chest pain. Went to cath lab and heparin to restart post cath> TCTS consult - plan CBAG this week post ticagrelor washout Sheath removed 1930 in cath lab. Order for heparin 8 hr post sheath removal. Restart heparin  8 hour post sheath removal so 0330. Heparin drip 900 uts/hr HL 0.53 at goal CBC stable, no bleeding noted.  Goal of Therapy:  Heparin level 0.3-0.7 units/ml Monitor platelets by anticoagulation protocol: Yes   Plan:  Heparin drip 900 uts/hr  Daily HL, CBC  Leota Sauers Pharm.D. CPP, BCPS Clinical Pharmacist [phone] 10/03/2016 9:42 AM

## 2016-10-03 NOTE — Progress Notes (Signed)
Progress Note  Patient Name: Jason [name] Date of Encounter: 10/03/2016  Primary Cardiologist: Dr. Katrinka Blazing  Subjective   Postop day 2 aborted anterior MI status post cardiac catheterization performed Dr. Katrinka Blazing revealing left main and LAD disease thought to be a surgical candidate. He did have visible thrombus. Dr. Nydia Bouton saw him in consultation. He did receive a loading dose of Brilenta and his by mouth he was 197 today. He denies chest pain. He is on IV heparin and nitroglycerin. Plan is for coronary artery bypass grafting.  Inpatient Medications    Scheduled Meds: . aspirin  81 mg Oral Daily  . atorvastatin  80 mg Oral q1800  . metoprolol tartrate  12.5 mg Oral BID  . sodium chloride flush  3 mL Intravenous Q12H   Continuous Infusions: . heparin 900 Units/hr (10/03/16 0800)  . nitroGLYCERIN 5 mcg/min (10/03/16 0800)   PRN Meds: sodium chloride, acetaminophen, nitroGLYCERIN, ondansetron (ZOFRAN) IV, sodium chloride flush   Vital Signs    Vitals:   10/03/16 0600 10/03/16 0751 10/03/16 0800 10/03/16 0900  BP: (!) 100/43  116/65 140/82  Pulse: 64  62 69  Resp: (!) 25  (!) 22 (!) 25  Temp:  98 F (36.7 C)    TempSrc:  Oral    SpO2: 93%  91% 96%  Weight:      Height:        Intake/Output Summary (Last 24 hours) at 10/03/16 0942 Last data filed at 10/03/16 0800  Gross per 24 hour  Intake          1154.78 ml  Output              990 ml  Net           164.78 ml   Filed Weights   10/01/16 1621 10/01/16 1730  Weight: 155 lb (70.3 kg) 152 lb 1.9 oz (69 kg)    Telemetry    Sinus rhythm in the 60s - Personally Reviewed  ECG    Normal sinus rhythm at 64 with anterolateral T-wave inversion and septal Q waves - Personally Reviewed  Physical Exam   GEN: No acute distress.   Neck: No JVD Cardiac: RRR, no murmurs, rubs, or gallops.  Respiratory: Clear to auscultation bilaterally. GI: Soft, nontender, non-distended  MS: No edema; No deformity. Neuro:  Nonfocal    Psych: Normal affect  Right radial puncture site intact Labs    Chemistry Recent Labs Lab 10/01/16 1626 10/01/16 1637 10/02/16 0455 10/03/16 0159  NA 139 140 139 138  K 4.6 4.8 3.9 3.7  CL 102 102 106 107  CO2 28  --  23 22  GLUCOSE 100* 101* 95 99  BUN 16 21* 13 13  CREATININE 1.30* 1.40* 1.08 1.13  CALCIUM 9.3  --  8.5* 8.5*  PROT 6.8  --   --  5.8*  ALBUMIN 3.7  --   --  3.2*  AST 30  --   --  73*  ALT 19  --   --  25  ALKPHOS 67  --   --  59  BILITOT 1.3*  --   --  1.8*  GFRNONAA 49*  --  >60 58*  GFRAA 56*  --  >60 >60  ANIONGAP 9  --  10 9     Hematology Recent Labs Lab 10/01/16 1626 10/01/16 1637 10/02/16 0455 10/03/16 0159  WBC 9.3  --  8.7 9.9  RBC 4.85  --  4.26 4.32  HGB  14.6 14.6 12.7* 12.9*  HCT 43.5 43.0 37.7* 38.2*  MCV 89.7  --  88.5 88.4  MCH 30.1  --  29.8 29.9  MCHC 33.6  --  33.7 33.8  RDW 13.6  --  13.7 13.8  PLT 156  --  142* 150    Cardiac Enzymes Recent Labs Lab 10/01/16 1626 10/01/16 1950 10/02/16 0455  TROPONINI 0.42* 28.46* 47.50*    Recent Labs Lab 10/01/16 1635  TROPIPOC 0.19*     BNP Recent Labs Lab 10/01/16 1627 10/01/16 1950 10/03/16 0159  BNP 59.7 82.7 269.4*     DDimer No results for input(s): DDIMER in the last 168 hours.   Radiology    Dg Chest Portable 1 View  Result Date: 10/01/2016 CLINICAL DATA:  Myocardial infarction EXAM: PORTABLE CHEST 1 VIEW COMPARISON:  August 13, 2014 FINDINGS: The heart size and mediastinal contours are within normal limits. Both lungs are clear. The visualized skeletal structures are unremarkable. IMPRESSION: No active disease. Electronically Signed   By: Gerome Sam III M.D   On: 10/01/2016 17:21    Cardiac Studies   Cath-  Conclusion    Acute coronary syndrome initially presenting with anterior ST elevation, spontaneously resolving by arrival in the emergency room.  Greater than 50% ostial left main associated with catheter damping and bradycardia with  engagement and contrast injection.  90+ percent mid LAD, thrombus filled culprit lesion.  90% ostial large branching obtuse marginal. Her complex is dominant giving the PDA.  Normal, widely patent nondominant RCA.  Left ventricular dysfunction with mid anterior wall to the apical akinesis, EF 35-45%. The regional wall motion abnormality is due to stunned muscle from transient LAD occlusion.  RECOMMENDATIONS:   TCTS consultation (spoke to Dr. Tyrone Sage) for consideration of elective bypass surgery next week.  Resume IV heparin and continue IV nitroglycerin.  A loading dose of Brilinta was used before the decision to consider surgery was made.  Continue low-dose beta blocker therapy.     Patient Profile     83 y.o. male without prior history of CAD who presented on 10/01/16 with acute coronary syndrome" code STEMI. He underwent cardiac catheterization by Dr. Katrinka Blazing. Right radial approach revealing moderate ostial left main disease, high-grade LAD disease with thrombus. His EF was in the 35-45% range. Dr. Katrinka Blazing on his anatomy was suitable for coronary artery bypass grafting. Dr. Tyrone Sage was consult saw the patient and agreed. He did get a loading dose of Brilenta. His urine was 197 today. He remains asymptomatic on IV heparin and nitroglycerin. His peak troponin was 47. His EKG shows anterolateral T-wave inversion.  Assessment & Plan    1: Coronary artery disease-postop day 2 cardiac catheterization revealing left main/2 vessel disease with moderate LV dysfunction. He did have thrombus in his LAD. Dr. Katrinka Blazing felt bypass grafting was the best revascularization option. Dr. Tyrone Sage saw the patient in consultation and agreed. He did receive a loading dose of Brilenta with a PRU of 197. He remains asymptomatic on IV heparin and nitroglycerin. Plan will be for coronary artery bypass grafting once this platelet inhibition has resolved.  2: Ischemic cardiomyopathy-EF 35% by lethargy tomography. He  is on low-dose beta blocker. ACE inhibitor will be added prior to discharge from the hospital. He will need a 2-D echocardiogram postop to determine LV function.  Alphonsus Sias, MD  10/03/2016, 9:42 AM

## 2016-10-03 NOTE — Plan of Care (Signed)
Problem: Safety: Goal: Ability to remain free from injury will improve Outcome: Completed/Met Date Met: 10/03/16 Pt is alert and oriented and able to communicate when he needs assistance. He is able to use call bell appropriately   Problem: Pain Managment: Goal: General experience of comfort will improve Outcome: Completed/Met Date Met: 10/03/16 Pt is having no pain issues  Problem: Activity: Goal: Risk for activity intolerance will decrease Outcome: Progressing Pt was able to sit in the chair for multiple hours today without difficulty.  Problem: Bowel/Gastric: Goal: Will not experience complications related to bowel motility Outcome: Progressing Pt had a bowel movement on 4/8, but is requesting a stool softener to be ordered to keep bowel regimen regular and decrease the amount of straining pt will have to do.

## 2016-10-03 NOTE — Progress Notes (Signed)
Spoke with Dr. Allyson Sabal who suggests no ambulation before surgery. Discussed sternal precautions, IS, mobility, and d/c planning with pt and wife. Voiced understanding. Wife sts she is not able to physically help him at home and in fact, he has been doing the cooking/cleaning for a while. If he needs much assist at d/c, he might need SNF for a short time. Before admission he tilled yard, put down seed, etc. 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 Encouraged mobility in the room with nursing supervision (due to cords). [id number] Ethelda Chick CES, ACSM 2:49 PM 10/03/2016

## 2016-10-03 NOTE — Progress Notes (Signed)
  Echocardiogram 2D Echocardiogram has been performed.  Arvil Chaco 10/03/2016, 3:28 PM

## 2016-10-04 DIAGNOSIS — I249 Acute ischemic heart disease, unspecified: Secondary | ICD-10-CM

## 2016-10-04 LAB — VAS US DOPPLER PRE CABG
LCCADSYS: 73 cm/s
LEFT ECA DIAS: -10 cm/s
LEFT VERTEBRAL DIAS: -10 cm/s
LICAPDIAS: -21 cm/s
Left CCA dist dias: 13 cm/s
Left CCA prox dias: 10 cm/s
Left CCA prox sys: 89 cm/s
Left ICA dist dias: -15 cm/s
Left ICA dist sys: -70 cm/s
Left ICA prox sys: -81 cm/s
RCCAPDIAS: 8 cm/s
RCCAPSYS: 73 cm/s
RIGHT ECA DIAS: -7 cm/s
RIGHT VERTEBRAL DIAS: -8 cm/s
Right cca dist sys: -76 cm/s

## 2016-10-04 LAB — GLUCOSE, CAPILLARY: Glucose-Capillary: 102 mg/dL — ABNORMAL HIGH (ref 65–99)

## 2016-10-04 LAB — HEPARIN LEVEL (UNFRACTIONATED): HEPARIN UNFRACTIONATED: 0.46 [IU]/mL (ref 0.30–0.70)

## 2016-10-04 LAB — PLATELET INHIBITION P2Y12: Platelet Function  P2Y12: 241 [PRU] (ref 194–418)

## 2016-10-04 MED ORDER — DOCUSATE SODIUM 50 MG PO CAPS
50.0000 mg | ORAL_CAPSULE | Freq: Every day | ORAL | Status: DC
  Administered 2016-10-04 – 2016-10-06 (×3): 50 mg via ORAL
  Filled 2016-10-04 (×5): qty 1

## 2016-10-04 NOTE — Progress Notes (Signed)
Progress Note  Patient Name: [name] Date of Encounter: 10/04/2016  Primary Cardiologist: Dr. Katrinka Blazing  Subjective   Postop day 3aborted anterior MI status post cardiac catheterization performed Dr. Katrinka Blazing revealing left main and LAD disease thought to be a surgical candidate. He did have visible thrombus. Dr. Tyrone Sage saw him in consultation. He did receive a loading dose of Brilenta and his PRU was 241 today. He denies chest pain. He is on IV heparin and nitroglycerin. Plan is for coronary artery bypass grafting At the end of this week.  Inpatient Medications    Scheduled Meds: . aspirin  81 mg Oral Daily  . atorvastatin  80 mg Oral q1800  . metoprolol tartrate  12.5 mg Oral BID  . sodium chloride flush  3 mL Intravenous Q12H   Continuous Infusions: . heparin 900 Units/hr (10/04/16 0916)  . nitroGLYCERIN 5 mcg/min (10/04/16 0800)   PRN Meds: sodium chloride, acetaminophen, nitroGLYCERIN, ondansetron (ZOFRAN) IV, sodium chloride flush, zolpidem   Vital Signs    Vitals:   10/04/16 0730 10/04/16 0800 10/04/16 0810 10/04/16 0900  BP:   (!) 105/52 123/68  Pulse: (!) 49  (!) 49 (!) 58  Resp: (!) 23  17 (!) 25  Temp:  97.7 F (36.5 C)    TempSrc:  Oral    SpO2: 95%  97% 95%  Weight:      Height:        Intake/Output Summary (Last 24 hours) at 10/04/16 1001 Last data filed at 10/04/16 0916  Gross per 24 hour  Intake              723 ml  Output              775 ml  Net              -52 ml   Filed Weights   10/01/16 1621 10/01/16 1730  Weight: 155 lb (70.3 kg) 152 lb 1.9 oz (69 kg)    Telemetry    Sinus rhythm in the 60s - Personally Reviewed  ECG    Normal sinus rhythm at 64 with anterolateral T-wave inversion and septal Q waves - Personally Reviewed  Physical Exam   GEN: No acute distress.   Neck: No JVD Cardiac: RRR, no murmurs, rubs, or gallops.  Respiratory: Clear to auscultation bilaterally. GI: Soft, nontender, non-distended  MS: No edema; No  deformity. Neuro:  Nonfocal  Psych: Normal affect  Right radial puncture site intact  Labs    Chemistry  Recent Labs Lab 10/01/16 1626 10/01/16 1637 10/02/16 0455 10/03/16 0159  NA 139 140 139 138  K 4.6 4.8 3.9 3.7  CL 102 102 106 107  CO2 28  --  23 22  GLUCOSE 100* 101* 95 99  BUN 16 21* 13 13  CREATININE 1.30* 1.40* 1.08 1.13  CALCIUM 9.3  --  8.5* 8.5*  PROT 6.8  --   --  5.8*  ALBUMIN 3.7  --   --  3.2*  AST 30  --   --  73*  ALT 19  --   --  25  ALKPHOS 67  --   --  59  BILITOT 1.3*  --   --  1.8*  GFRNONAA 49*  --  >60 58*  GFRAA 56*  --  >60 >60  ANIONGAP 9  --  10 9     Hematology  Recent Labs Lab 10/01/16 1626 10/01/16 1637 10/02/16 0455 10/03/16 0159  WBC 9.3  --  8.7 9.9  RBC 4.85  --  4.26 4.32  HGB 14.6 14.6 12.7* 12.9*  HCT 43.5 43.0 37.7* 38.2*  MCV 89.7  --  88.5 88.4  MCH 30.1  --  29.8 29.9  MCHC 33.6  --  33.7 33.8  RDW 13.6  --  13.7 13.8  PLT 156  --  142* 150    Cardiac Enzymes  Recent Labs Lab 10/01/16 1626 10/01/16 1950 10/02/16 0455  TROPONINI 0.42* 28.46* 47.50*     Recent Labs Lab 10/01/16 1635  TROPIPOC 0.19*     BNP  Recent Labs Lab 10/01/16 1627 10/01/16 1950 10/03/16 0159  BNP 59.7 82.7 269.4*     DDimer No results for input(s): DDIMER in the last 168 hours.   Radiology    No results found.  Cardiac Studies   Cath-  Conclusion    Acute coronary syndrome initially presenting with anterior ST elevation, spontaneously resolving by arrival in the emergency room.  Greater than 50% ostial left main associated with catheter damping and bradycardia with engagement and contrast injection.  90+ percent mid LAD, thrombus filled culprit lesion.  90% ostial large branching obtuse marginal. Her complex is dominant giving the PDA.  Normal, widely patent nondominant RCA.  Left ventricular dysfunction with mid anterior wall to the apical akinesis, EF 35-45%. The regional wall motion abnormality is  due to stunned muscle from transient LAD occlusion.  RECOMMENDATIONS:   TCTS consultation (spoke to Dr. Tyrone Sage) for consideration of elective bypass surgery next week.  Resume IV heparin and continue IV nitroglycerin.  A loading dose of Brilinta was used before the decision to consider surgery was made.  Continue low-dose beta blocker therapy.     Patient Profile     83 y.o. male without prior history of CAD who presented on 10/01/16 with acute coronary syndrome" code STEMI. He underwent cardiac catheterization by Dr. Katrinka Blazing. Right radial approach revealing moderate ostial left main disease, high-grade LAD disease with thrombus. His EF was in the 35-45% range. Dr. Katrinka Blazing on his anatomy was suitable for coronary artery bypass grafting. Dr. Tyrone Sage was consult saw the patient and agreed. He did get a loading dose of Brilenta. His urine was 197 today. He remains asymptomatic on IV heparin and nitroglycerin. His peak troponin was 47. His EKG shows anterolateral T-wave inversion.  Assessment & Plan    1: Coronary artery disease-postop day 3 cardiac catheterization revealing left main/2 vessel disease with moderate LV dysfunction. He did have thrombus in his LAD. Dr. Katrinka Blazing felt bypass grafting was the best revascularization option. Dr. Tyrone Sage saw the patient in consultation and agreed. He did receive a loading dose of Brilenta with a PRU of 241 today. He remains asymptomatic on IV heparin and nitroglycerin. Plan will be for coronary artery bypass grafting now that his Brilenta effect has resolved.   2: Ischemic cardiomyopathy-EF 35% by lethargy tomography. He is on low-dose beta blocker. ACE inhibitor will be added prior to discharge from the hospital. He will need a 2-D echocardiogram postop to determine LV function.  Alphonsus Sias, MD  10/04/2016, 10:01 AM

## 2016-10-04 NOTE — Progress Notes (Signed)
ANTICOAGULATION CONSULT NOTE - Follow Up Consult  Pharmacy Consult for Heparin Indication: chest pain/ACS  Allergies  Allergen Reactions  . Bee Pollen Anaphylaxis  . Tetanus Toxoids Swelling    Tetanus Shot  . Hydrocodone Other (See Comments)    Confusion, "talking out of his head"    Patient Measurements: Height:  (162.6 cm) Weight: 152 lb 1.9 oz (69 kg) IBW/kg (Calculated) : 59.2 Heparin Dosing Weight: 70.3 kg  Vital Signs: Temp: 97.7 F (36.5 C) (04/10 0800) Temp Source: Oral (04/10 0800) BP: 123/68 (04/10 0900) Pulse Rate: 58 (04/10 0900)  Labs:  Recent Labs  10/01/16 1626 10/01/16 1637 10/01/16 1950 10/02/16 0455 10/02/16 1147 10/03/16 0159 10/04/16 0519  HGB 14.6 14.6  --  12.7*  --  12.9*  --   HCT 43.5 43.0  --  37.7*  --  38.2*  --   PLT 156  --   --  142*  --  150  --   APTT 32  --   --   --   --   --   --   LABPROT 13.1  --   --   --   --   --   --   INR 0.99  --   --   --   --   --   --   HEPARINUNFRC  --   --   --   --  0.42 0.53 0.46  CREATININE 1.30* 1.40*  --  1.08  --  1.13  --   TROPONINI 0.42*  --  28.46* 47.50*  --   --   --     Estimated Creatinine Clearance: 40.7 mL/min (by C-G formula based on SCr of 1.13 mg/dL).   Medical History: Past Medical History:  Diagnosis Date  . Anxiety    takes Diazepam daily prn  . Arthritis   . Constipation   . Depression    takes Trazodone nightly  . Enlarged prostate   . GERD (gastroesophageal reflux disease)   . H/O hiatal hernia    takes Omeprazole daily  . Hemorrhoids   . History of colitis    many yrs ago  . History of colon polyps   . History of kidney stones    passed on his own  . Hyperlipidemia    was on medication but has been off for a while  . Insomnia    takes Trazodone nightly  . 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 Urinary frequency     Medications:  Prescriptions Prior to Admission  Medication Sig Dispense Refill Last Dose  . cyclobenzaprine (FLEXERIL) 5 MG  tablet Take 1 tablet (5 mg total) by mouth at bedtime. (Patient not taking: Reported on 03/10/2015) 10 tablet 0 Not Taking at Unknown time  . doxycycline (VIBRA-TABS) 100 MG tablet Take 1 tablet (100 mg total) by mouth 2 (two) times daily. Do not lie down for 1 hour after meds (Patient not taking: Reported on 03/10/2015) 14 tablet 0 Not Taking at Unknown time   Scheduled:  . aspirin  81 mg Oral Daily  . atorvastatin  80 mg Oral q1800  . metoprolol tartrate  12.5 mg Oral BID  . sodium chloride flush  3 mL Intravenous Q12H   Infusions:  . heparin 900 Units/hr (10/04/16 0916)  . nitroGLYCERIN 5 mcg/min (10/04/16 0800)    Assessment: 83yo male presents with CP. Pharmacy is consulted to dose heparin for ACS/chest pain. Went to cath lab and heparin to restart post  cath> TCTS consult - plan CBAG this week post ticagrelor washout  P2Y12 now up 240 ok for OR  Sheath removed 1930 in cath lab. Order for heparin 8 hr post sheath removal. Restart heparin 8 hour post sheath removal so 0330. Heparin drip 900 uts/hr HL 0.46 at goal CBC stable, no bleeding noted.  Goal of Therapy:  Heparin level 0.3-0.7 units/ml Monitor platelets by anticoagulation protocol: Yes   Plan:  Heparin drip 900 uts/hr  Daily HL, CBC  Leota Sauers Pharm.D. CPP, BCPS Clinical Pharmacist [phone] 10/04/2016 10:08 AM

## 2016-10-04 NOTE — Care Management Note (Signed)
Case Management Note  Patient Details  Name: Jason [name] MRN: [id number] Date of Birth: May 07, 1930  Subjective/Objective:                 Patient from home with wife. Plan is for coronary artery bypass grafting At the end of this week. Wife sts she is not able to physically help him at home and in fact, he has been doing the cooking/cleaning for a while. If he needs much assist at d/c, he might need SNF for a short time.    Action/Plan:  CM will continue to follow.   Expected Discharge Date:                  Expected Discharge Plan:  Home w Home Health Services  In-House Referral:     Discharge planning Services  CM Consult  Post Acute Care Choice:    Choice offered to:     DME Arranged:    DME Agency:     HH Arranged:    HH Agency:     Status of Service:  In process, will continue to follow  If discussed at Long Length of Stay Meetings, dates discussed:    Additional Comments:  Lawerance Sabal, RN 10/04/2016, 3:30 PM

## 2016-10-05 ENCOUNTER — Inpatient Hospital Stay (HOSPITAL_COMMUNITY): Payer: Medicare Other

## 2016-10-05 DIAGNOSIS — I2119 ST elevation (STEMI) myocardial infarction involving other coronary artery of inferior wall: Secondary | ICD-10-CM

## 2016-10-05 LAB — SPIROMETRY WITH GRAPH
FEF 25-75 Post: 1.52 L/sec
FEF 25-75 Pre: 1.39 L/sec
FEF2575-%Change-Post: 9 %
FEF2575-%Pred-Post: 124 %
FEF2575-%Pred-Pre: 114 %
FEV1-%Change-Post: 2 %
FEV1-%Pred-Post: 99 %
FEV1-%Pred-Pre: 97 %
FEV1-Post: 1.94 L
FEV1-Pre: 1.9 L
FEV1FVC-%Change-Post: 1 %
FEV1FVC-%Pred-Pre: 104 %
FEV6-%Change-Post: 1 %
FEV6-%Pred-Post: 99 %
FEV6-%Pred-Pre: 97 %
FEV6-Post: 2.59 L
FEV6-Pre: 2.55 L
FEV6FVC-%Pred-Post: 109 %
FEV6FVC-%Pred-Pre: 109 %
FVC-%Change-Post: 1 %
FVC-%Pred-Post: 90 %
FVC-%Pred-Pre: 89 %
FVC-Post: 2.59 L
FVC-Pre: 2.55 L
Post FEV1/FVC ratio: 75 %
Post FEV6/FVC ratio: 100 %
Pre FEV1/FVC ratio: 74 %
Pre FEV6/FVC Ratio: 100 %

## 2016-10-05 LAB — HEPARIN LEVEL (UNFRACTIONATED): Heparin Unfractionated: 0.38 IU/mL (ref 0.30–0.70)

## 2016-10-05 MED ORDER — PRAMOXINE HCL 1 % RE FOAM
Freq: Three times a day (TID) | RECTAL | Status: DC | PRN
  Filled 2016-10-05: qty 15

## 2016-10-05 MED ORDER — FLEET ENEMA 7-19 GM/118ML RE ENEM
1.0000 | ENEMA | Freq: Once | RECTAL | Status: AC
  Administered 2016-10-05: 1 via RECTAL
  Filled 2016-10-05: qty 1

## 2016-10-05 MED ORDER — MAGNESIUM HYDROXIDE 400 MG/5ML PO SUSP
30.0000 mL | Freq: Every day | ORAL | Status: DC | PRN
  Administered 2016-10-05 – 2016-10-06 (×2): 30 mL via ORAL
  Filled 2016-10-05 (×2): qty 30

## 2016-10-05 MED ORDER — HYDROCORTISONE ACE-PRAMOXINE 1-1 % RE FOAM
1.0000 | Freq: Three times a day (TID) | RECTAL | Status: DC | PRN
  Filled 2016-10-05: qty 10

## 2016-10-05 MED ORDER — ALBUTEROL SULFATE (2.5 MG/3ML) 0.083% IN NEBU
2.5000 mg | INHALATION_SOLUTION | Freq: Once | RESPIRATORY_TRACT | Status: AC
  Administered 2016-10-05: 2.5 mg via RESPIRATORY_TRACT

## 2016-10-05 MED ORDER — PHENYLEPH-SHARK LIV OIL-MO-PET 0.25-3-14-71.9 % RE OINT: TOPICAL_OINTMENT | Freq: Two times a day (BID) | RECTAL | Status: DC | PRN

## 2016-10-05 NOTE — Progress Notes (Signed)
      [address]       [place] [address]             [phone]      Plan cabg Friday am Patient agreeable Follow up echo done:  Study Conclusions  - Left ventricle: The cavity size was normal. There was mild   concentric hypertrophy. Systolic function was mildly reduced. The   estimated ejection fraction was in the range of 45% to 50%. Wall   motion was normal; there were no regional wall motion   abnormalities. Doppler parameters are consistent with abnormal   left ventricular relaxation (grade 1 diastolic dysfunction).   There was no evidence of elevated ventricular filling pressure by   Doppler parameters. - Aortic valve: Trileaflet; mildly thickened, mildly calcified   leaflets. There was moderate regurgitation. - Aortic root: The aortic root was normal in size. - Mitral valve: Structurally normal valve. There was trivial   regurgitation. - Right ventricle: The cavity size was normal. Wall thickness was   normal. Systolic function was normal. - Tricuspid valve: There was mild regurgitation. - Pulmonary arteries: Systolic pressure was mildly increased. PA   peak pressure: 31 mm Hg (S). - Inferior vena cava: The vessel was normal in size. - Pericardium, extracardiac: There was no pericardial effusion.  Impressions:  - There is hypokinesis of all of the apical segments. Overall LVEF   is mildly decreased at 45-50%.  [name] MD      [address] [place].[address] [place] [address] Office [phone]   Beeper [phone]

## 2016-10-05 NOTE — Progress Notes (Signed)
ANTICOAGULATION CONSULT NOTE - Follow Up Consult  Pharmacy Consult for Heparin Indication: chest pain/ACS  Allergies  Allergen Reactions  . Bee Pollen Anaphylaxis  . Tetanus Toxoids Swelling    Tetanus Shot  . Hydrocodone Other (See Comments)    Confusion, "talking out of his head"    Patient Measurements: Height:  (162.6 cm) Weight: 152 lb 1.9 oz (69 kg) IBW/kg (Calculated) : 59.2 Heparin Dosing Weight: 70.3 kg  Vital Signs: Temp: 98.1 F (36.7 C) (04/11 0736) Temp Source: Oral (04/11 0736) BP: 111/61 (04/11 0820) Pulse Rate: 56 (04/11 0820)  Labs:  Recent Labs  10/03/16 0159 10/04/16 0519 10/05/16 0312  HGB 12.9*  --   --   HCT 38.2*  --   --   PLT 150  --   --   HEPARINUNFRC 0.53 0.46 0.38  CREATININE 1.13  --   --     Estimated Creatinine Clearance: 40.7 mL/min (by C-G formula based on SCr of 1.13 mg/dL).   Medical History: Past Medical History:  Diagnosis Date  . Anxiety    takes Diazepam daily prn  . Arthritis   . Constipation   . Depression    takes Trazodone nightly  . Enlarged prostate   . GERD (gastroesophageal reflux disease)   . H/O hiatal hernia    takes Omeprazole daily  . Hemorrhoids   . History of colitis    many yrs ago  . History of colon polyps   . History of kidney stones    passed on his own  . Hyperlipidemia    was on medication but has been off for a while  . Insomnia    takes Trazodone nightly  . 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 Urinary frequency     Medications:  Prescriptions Prior to Admission  Medication Sig Dispense Refill Last Dose  . cyclobenzaprine (FLEXERIL) 5 MG tablet Take 1 tablet (5 mg total) by mouth at bedtime. (Patient not taking: Reported on 03/10/2015) 10 tablet 0 Not Taking at Unknown time  . doxycycline (VIBRA-TABS) 100 MG tablet Take 1 tablet (100 mg total) by mouth 2 (two) times daily. Do not lie down for 1 hour after meds (Patient not taking: Reported on 03/10/2015) 14 tablet 0 Not  Taking at Unknown time   Scheduled:  . aspirin  81 mg Oral Daily  . atorvastatin  80 mg Oral q1800  . docusate sodium  50 mg Oral Daily  . metoprolol tartrate  12.5 mg Oral BID  . sodium chloride flush  3 mL Intravenous Q12H   Infusions:  . heparin 900 Units/hr (10/05/16 0700)  . nitroGLYCERIN 5 mcg/min (10/05/16 0700)    Assessment: 83yo male presents with CP. Pharmacy is consulted to dose heparin for ACS/chest pain. Went to cath lab and heparin to restart post cath> TCTS consult - plan CBAG this week post ticagrelor washout  P2Y12 now up 240 ok for OR - planned for Fri  Sheath removed 1930 in cath lab. Order for heparin 8 hr post sheath removal. Restart heparin 8 hour post sheath removal so 0330. Heparin drip 900 uts/hr HL 0.38 at goal CBC stable, no bleeding noted.  Goal of Therapy:  Heparin level 0.3-0.7 units/ml Monitor platelets by anticoagulation protocol: Yes   Plan:  Heparin drip 900 uts/hr  Daily HL, CBC  Leota Sauers Pharm.D. CPP, BCPS Clinical Pharmacist [phone] 10/05/2016 9:44 AM

## 2016-10-05 NOTE — Plan of Care (Signed)
Problem: Bowel/Gastric: Goal: Will not experience complications related to bowel motility Outcome: Progressing Patient able to have large BM after Fleets enema.

## 2016-10-05 NOTE — Progress Notes (Signed)
Progress Note  Patient Name: Jason [name] Date of Encounter: 10/05/2016  Primary Cardiologist: Dr. Katrinka Blazing  Subjective   Postop day 3aborted anterior MI status post cardiac catheterization performed Dr. Katrinka Blazing revealing left main and LAD disease thought to be a surgical candidate. He did have visible thrombus. Dr. Tyrone Sage saw him in consultation. He did receive a loading dose of Brilenta and his PRU was 241 today. He denies chest pain. He is on IV heparin and nitroglycerin. Plan is for coronary artery bypass grafting On Friday by Dr. Tyrone Sage.  Inpatient Medications    Scheduled Meds: . aspirin  81 mg Oral Daily  . atorvastatin  80 mg Oral q1800  . docusate sodium  50 mg Oral Daily  . metoprolol tartrate  12.5 mg Oral BID  . sodium chloride flush  3 mL Intravenous Q12H   Continuous Infusions: . heparin 900 Units/hr (10/05/16 0700)  . nitroGLYCERIN 5 mcg/min (10/05/16 0700)   PRN Meds: sodium chloride, acetaminophen, nitroGLYCERIN, ondansetron (ZOFRAN) IV, sodium chloride flush, zolpidem   Vital Signs    Vitals:   10/05/16 0700 10/05/16 0736 10/05/16 0800 10/05/16 0820  BP: 95/61   111/61  Pulse: (!) 56  60 (!) 56  Resp: (!) 23  17 (!) 25  Temp:  98.1 F (36.7 C)    TempSrc:  Oral    SpO2: 94%  97% 94%  Weight:      Height:        Intake/Output Summary (Last 24 hours) at 10/05/16 0951 Last data filed at 10/05/16 0900  Gross per 24 hour  Intake            382.5 ml  Output              900 ml  Net           -517.5 ml   Filed Weights   10/01/16 1621 10/01/16 1730  Weight: 155 lb (70.3 kg) 152 lb 1.9 oz (69 kg)    Telemetry    Sinus rhythm in the 60s - Personally Reviewed  ECG    Normal sinus rhythm at 64 with anterolateral T-wave inversion and septal Q waves - Personally Reviewed  Physical Exam   GEN: No acute distress.   Neck: No JVD Cardiac: RRR, no murmurs, rubs, or gallops.  Respiratory: Clear to auscultation bilaterally. GI: Soft, nontender,  non-distended  MS: No edema; No deformity. Neuro:  Nonfocal  Psych: Normal affect  Right radial puncture site intact  Labs    Chemistry  Recent Labs Lab 10/01/16 1626 10/01/16 1637 10/02/16 0455 10/03/16 0159  NA 139 140 139 138  K 4.6 4.8 3.9 3.7  CL 102 102 106 107  CO2 28  --  23 22  GLUCOSE 100* 101* 95 99  BUN 16 21* 13 13  CREATININE 1.30* 1.40* 1.08 1.13  CALCIUM 9.3  --  8.5* 8.5*  PROT 6.8  --   --  5.8*  ALBUMIN 3.7  --   --  3.2*  AST 30  --   --  73*  ALT 19  --   --  25  ALKPHOS 67  --   --  59  BILITOT 1.3*  --   --  1.8*  GFRNONAA 49*  --  >60 58*  GFRAA 56*  --  >60 >60  ANIONGAP 9  --  10 9     Hematology  Recent Labs Lab 10/01/16 1626 10/01/16 1637 10/02/16 0455 10/03/16 0159  WBC  9.3  --  8.7 9.9  RBC 4.85  --  4.26 4.32  HGB 14.6 14.6 12.7* 12.9*  HCT 43.5 43.0 37.7* 38.2*  MCV 89.7  --  88.5 88.4  MCH 30.1  --  29.8 29.9  MCHC 33.6  --  33.7 33.8  RDW 13.6  --  13.7 13.8  PLT 156  --  142* 150    Cardiac Enzymes  Recent Labs Lab 10/01/16 1626 10/01/16 1950 10/02/16 0455  TROPONINI 0.42* 28.46* 47.50*     Recent Labs Lab 10/01/16 1635  TROPIPOC 0.19*     BNP  Recent Labs Lab 10/01/16 1627 10/01/16 1950 10/03/16 0159  BNP 59.7 82.7 269.4*     DDimer No results for input(s): DDIMER in the last 168 hours.   Radiology    No results found.  Cardiac Studies   Cath-  Conclusion    Acute coronary syndrome initially presenting with anterior ST elevation, spontaneously resolving by arrival in the emergency room.  Greater than 50% ostial left main associated with catheter damping and bradycardia with engagement and contrast injection.  90+ percent mid LAD, thrombus filled culprit lesion.  90% ostial large branching obtuse marginal. Her complex is dominant giving the PDA.  Normal, widely patent nondominant RCA.  Left ventricular dysfunction with mid anterior wall to the apical akinesis, EF 35-45%. The  regional wall motion abnormality is due to stunned muscle from transient LAD occlusion.  RECOMMENDATIONS:   TCTS consultation (spoke to Dr. Tyrone Sage) for consideration of elective bypass surgery next week.  Resume IV heparin and continue IV nitroglycerin.  A loading dose of Brilinta was used before the decision to consider surgery was made.  Continue low-dose beta blocker therapy.    2D Echo-   Study Conclusions  - Left ventricle: The cavity size was normal. There was mild   concentric hypertrophy. Systolic function was mildly reduced. The   estimated ejection fraction was in the range of 45% to 50%. Wall   motion was normal; there were no regional wall motion   abnormalities. Doppler parameters are consistent with abnormal   left ventricular relaxation (grade 1 diastolic dysfunction).   There was no evidence of elevated ventricular filling pressure by   Doppler parameters. - Aortic valve: Trileaflet; mildly thickened, mildly calcified   leaflets. There was moderate regurgitation. - Aortic root: The aortic root was normal in size. - Mitral valve: Structurally normal valve. There was trivial   regurgitation. - Right ventricle: The cavity size was normal. Wall thickness was   normal. Systolic function was normal. - Tricuspid valve: There was mild regurgitation. - Pulmonary arteries: Systolic pressure was mildly increased. PA   peak pressure: 31 mm Hg (S). - Inferior vena cava: The vessel was normal in size. - Pericardium, extracardiac: There was no pericardial effusion.  Impressions:  - There is hypokinesis of all of the apical segments. Overall LVEF   is mildly decreased at 45-50%.  Patient Profile     83 y.o. male without prior history of CAD who presented on 10/01/16 with acute coronary syndrome" code STEMI. He underwent cardiac catheterization by Dr. Katrinka Blazing. Right radial approach revealing moderate ostial left main disease, high-grade LAD disease with thrombus. His  EF was in the 35-45% range. Dr. Katrinka Blazing on his anatomy was suitable for coronary artery bypass grafting. Dr. Tyrone Sage was consult saw the patient and agreed. He did get a loading dose of Brilenta. His urine was 197 today. He remains asymptomatic on IV heparin and nitroglycerin. His  peak troponin was 47. His EKG shows anterolateral T-wave inversion.  Assessment & Plan    1: Coronary artery disease-postop day 3 cardiac catheterization revealing left main/2 vessel disease with moderate LV dysfunction. He did have thrombus in his LAD. Dr. Katrinka Blazing felt bypass grafting was the best revascularization option. Dr. Tyrone Sage saw the patient in consultation and agreed. He did receive a loading dose of Brilenta with a PRU of 241 today. He remains asymptomatic on IV heparin and nitroglycerin. Plan will be for coronary artery bypass grafting now that his Brilenta effect has resolved.   2: Ischemic cardiomyopathy-EF 35% by Cardiac catheterization. He is on low-dose beta blocker. ACE inhibitor will be added prior to discharge from the hospital. He had a 2-D echocardiogram revealing EF of 45-50%. There was moderate aortic insufficiency noted.   Alphonsus Sias, MD  10/05/2016, 9:51 AM

## 2016-10-05 NOTE — Progress Notes (Signed)
Patient and patient's wife worried patient is constipated.  Patient had BM on Sunday April 8th.  Patient has history of hemorrhoidectomy and wife states patient needs to have a BM everyday, and likely needs an enema.  Patient has been instructed not to strain to have BM due to location of CAD;PRN MOM given.  Patient able to have small, hard BM after much straining and had to manually disimpact himself.  RN again told patient to not strain and assisted patient back to bed.  RN paged cards NP for further orders. Cleona, Mitzi Hansen

## 2016-10-06 ENCOUNTER — Encounter (HOSPITAL_COMMUNITY): Payer: Medicare Other

## 2016-10-06 DIAGNOSIS — I25110 Atherosclerotic heart disease of native coronary artery with unstable angina pectoris: Secondary | ICD-10-CM

## 2016-10-06 LAB — COMPREHENSIVE METABOLIC PANEL
ALT: 38 U/L (ref 17–63)
AST: 46 U/L — ABNORMAL HIGH (ref 15–41)
Albumin: 3.1 g/dL — ABNORMAL LOW (ref 3.5–5.0)
Alkaline Phosphatase: 60 U/L (ref 38–126)
Anion gap: 8 (ref 5–15)
BUN: 17 mg/dL (ref 6–20)
CO2: 23 mmol/L (ref 22–32)
Calcium: 8.3 mg/dL — ABNORMAL LOW (ref 8.9–10.3)
Chloride: 107 mmol/L (ref 101–111)
Creatinine, Ser: 1.18 mg/dL (ref 0.61–1.24)
GFR calc Af Amer: >60 mL/min (ref >60)
GFR calc non Af Amer: 55 mL/min — ABNORMAL LOW (ref >60)
Glucose, Bld: 98 mg/dL (ref 65–99)
Potassium: 4 mmol/L (ref 3.5–5.1)
Sodium: 138 mmol/L (ref 135–145)
Total Bilirubin: 1.5 mg/dL — ABNORMAL HIGH (ref 0.3–1.2)
Total Protein: 5.7 g/dL — ABNORMAL LOW (ref 6.5–8.1)

## 2016-10-06 LAB — CBC
HCT: 36.9 % — ABNORMAL LOW (ref 39.0–52.0)
Hemoglobin: 12.4 g/dL — ABNORMAL LOW (ref 13.0–17.0)
MCH: 30 pg (ref 26.0–34.0)
MCHC: 33.6 g/dL (ref 30.0–36.0)
MCV: 89.3 fL (ref 78.0–100.0)
Platelets: 148 10*3/uL — ABNORMAL LOW (ref 150–400)
RBC: 4.13 MIL/uL — ABNORMAL LOW (ref 4.22–5.81)
RDW: 14.3 % (ref 11.5–15.5)
WBC: 8.8 10*3/uL (ref 4.0–10.5)

## 2016-10-06 LAB — HEPARIN LEVEL (UNFRACTIONATED)
HEPARIN UNFRACTIONATED: 0.27 [IU]/mL — AB (ref 0.30–0.70)
Heparin Unfractionated: 0.55 IU/mL (ref 0.30–0.70)

## 2016-10-06 LAB — SURGICAL PCR SCREEN
MRSA, PCR: NEGATIVE
Staphylococcus aureus: NEGATIVE

## 2016-10-06 LAB — ABO/RH: ABO/RH(D): A POS

## 2016-10-06 MED ORDER — TRANEXAMIC ACID 1000 MG/10ML IV SOLN
1.5000 mg/kg/h | INTRAVENOUS | Status: AC
  Administered 2016-10-07: 1.5 mg/kg/h via INTRAVENOUS
  Filled 2016-10-06 (×2): qty 25

## 2016-10-06 MED ORDER — SODIUM CHLORIDE 0.9 % IV SOLN
INTRAVENOUS | Status: AC
  Administered 2016-10-07: .8 [IU]/h via INTRAVENOUS
  Filled 2016-10-06 (×2): qty 2.5

## 2016-10-06 MED ORDER — POTASSIUM CHLORIDE 2 MEQ/ML IV SOLN
80.0000 meq | INTRAVENOUS | Status: DC
  Filled 2016-10-06 (×2): qty 40

## 2016-10-06 MED ORDER — SODIUM CHLORIDE 0.9 % IV SOLN
INTRAVENOUS | Status: DC
  Filled 2016-10-06 (×2): qty 30

## 2016-10-06 MED ORDER — MAGNESIUM SULFATE 50 % IJ SOLN
40.0000 meq | INTRAMUSCULAR | Status: DC
  Filled 2016-10-06 (×2): qty 10

## 2016-10-06 MED ORDER — SODIUM CHLORIDE 0.9 % IV SOLN
30.0000 ug/min | INTRAVENOUS | Status: AC
  Administered 2016-10-07: 15 ug/min via INTRAVENOUS
  Filled 2016-10-06 (×2): qty 2

## 2016-10-06 MED ORDER — EPINEPHRINE PF 1 MG/ML IJ SOLN
0.0000 ug/min | INTRAVENOUS | Status: DC
  Filled 2016-10-06 (×2): qty 4

## 2016-10-06 MED ORDER — CHLORHEXIDINE GLUCONATE CLOTH 2 % EX PADS
6.0000 | MEDICATED_PAD | Freq: Once | CUTANEOUS | Status: AC
  Administered 2016-10-06: 6 via TOPICAL

## 2016-10-06 MED ORDER — DEXMEDETOMIDINE HCL IN NACL 400 MCG/100ML IV SOLN
0.1000 ug/kg/h | INTRAVENOUS | Status: AC
  Administered 2016-10-07: .3 ug/kg/h via INTRAVENOUS
  Filled 2016-10-06 (×2): qty 100

## 2016-10-06 MED ORDER — CHLORHEXIDINE GLUCONATE CLOTH 2 % EX PADS
6.0000 | MEDICATED_PAD | Freq: Once | CUTANEOUS | Status: AC
  Administered 2016-10-07: 6 via TOPICAL

## 2016-10-06 MED ORDER — METOPROLOL TARTRATE 12.5 MG HALF TABLET: 12.5000 mg | ORAL_TABLET | Freq: Once | ORAL | Status: DC

## 2016-10-06 MED ORDER — TEMAZEPAM 15 MG PO CAPS
15.0000 mg | ORAL_CAPSULE | Freq: Once | ORAL | Status: AC | PRN
  Administered 2016-10-07: 15 mg via ORAL
  Filled 2016-10-06: qty 1

## 2016-10-06 MED ORDER — TRANEXAMIC ACID (OHS) PUMP PRIME SOLUTION
2.0000 mg/kg | INTRAVENOUS | Status: DC
  Filled 2016-10-06 (×2): qty 1.38

## 2016-10-06 MED ORDER — NITROGLYCERIN IN D5W 200-5 MCG/ML-% IV SOLN
2.0000 ug/min | INTRAVENOUS | Status: DC
  Filled 2016-10-06: qty 250

## 2016-10-06 MED ORDER — DEXTROSE 5 % IV SOLN
1.5000 g | INTRAVENOUS | Status: AC
  Administered 2016-10-07: .75 g via INTRAVENOUS
  Administered 2016-10-07: 1.5 g via INTRAVENOUS
  Filled 2016-10-06: qty 1.5

## 2016-10-06 MED ORDER — TRANEXAMIC ACID (OHS) BOLUS VIA INFUSION
15.0000 mg/kg | INTRAVENOUS | Status: AC
  Administered 2016-10-07: 1035 mg via INTRAVENOUS
  Filled 2016-10-06 (×2): qty 1035

## 2016-10-06 MED ORDER — CEFUROXIME SODIUM 750 MG IJ SOLR
750.0000 mg | INTRAMUSCULAR | Status: DC
  Filled 2016-10-06 (×2): qty 750

## 2016-10-06 MED ORDER — BISACODYL 5 MG PO TBEC: 5.0000 mg | DELAYED_RELEASE_TABLET | Freq: Once | ORAL | Status: DC

## 2016-10-06 MED ORDER — VANCOMYCIN HCL 10 G IV SOLR
1250.0000 mg | INTRAVENOUS | Status: AC
  Administered 2016-10-07: 1250 mg via INTRAVENOUS
  Filled 2016-10-06 (×2): qty 1250

## 2016-10-06 MED ORDER — PLASMA-LYTE 148 IV SOLN
INTRAVENOUS | Status: AC
  Administered 2016-10-07: 500 mL
  Filled 2016-10-06 (×2): qty 2.5

## 2016-10-06 MED ORDER — DOPAMINE-DEXTROSE 3.2-5 MG/ML-% IV SOLN
0.0000 ug/kg/min | INTRAVENOUS | Status: DC
  Filled 2016-10-06: qty 250

## 2016-10-06 MED ORDER — CHLORHEXIDINE GLUCONATE 0.12 % MT SOLN
15.0000 mL | Freq: Once | OROMUCOSAL | Status: AC
  Administered 2016-10-07: 15 mL via OROMUCOSAL
  Filled 2016-10-06: qty 15

## 2016-10-06 NOTE — Progress Notes (Signed)
Progress Note  Patient Name: Jason [name] Date of Encounter: 10/06/2016  Primary Cardiologist: Dr. Katrinka Blazing postop day 3   Subjective   Feeling well. No chest pain, sob or palpitations. For CABG tomorrow with Dr. Tyrone Sage.   Inpatient Medications    Scheduled Meds: . aspirin  81 mg Oral Daily  . atorvastatin  80 mg Oral q1800  . docusate sodium  50 mg Oral Daily  . metoprolol tartrate  12.5 mg Oral BID  . sodium chloride flush  3 mL Intravenous Q12H   Continuous Infusions: . heparin 1,000 Units/hr (10/06/16 0610)  . nitroGLYCERIN 5 mcg/min (10/05/16 0700)   PRN Meds: sodium chloride, acetaminophen, hydrocortisone-pramoxine, magnesium hydroxide, nitroGLYCERIN, ondansetron (ZOFRAN) IV, sodium chloride flush, zolpidem   Vital Signs    Vitals:   10/06/16 0400 10/06/16 0500 10/06/16 0600 10/06/16 0700  BP: (!) 100/55 (!) 101/53 (!) 113/59   Pulse: (!) 55 (!) 50 60   Resp: (!) Temp:    98.2 F (36.8 C)  TempSrc:    Oral  SpO2: 94% 97% 98%   Weight:      Height:        Intake/Output Summary (Last 24 hours) at 10/06/16 0802 Last data filed at 10/06/16 0600  Gross per 24 hour  Intake             1191 ml  Output              775 ml  Net              416 ml   Filed Weights   10/01/16 1621 10/01/16 1730  Weight: 155 lb (70.3 kg) 152 lb 1.9 oz (69 kg)    Telemetry    Sinus rhythm at rate of 50s - Personally Reviewed  ECG    None today   Physical Exam   GEN: No acute distress.   Neck: No JVD Cardiac: RRR, no murmurs, rubs, or gallops. Right radial cath site stable Respiratory: Clear to auscultation bilaterally. GI: Soft, nontender, non-distended  MS: No edema; No deformity. Neuro:  Nonfocal  Psych: Normal affect   Labs    Chemistry Recent Labs Lab 10/01/16 1626  10/02/16 0455 10/03/16 0159 10/06/16 0334  NA 139  < > 139 138 138  K 4.6  < > 3.9 3.7 4.0  CL 102  < > 106 107 107  CO2 28  --  GLUCOSE 100*  < > 95 99 98  BUN  16  < > CREATININE 1.30*  < > 1.08 1.13 1.18  CALCIUM 9.3  --  8.5* 8.5* 8.3*  PROT 6.8  --   --  5.8* 5.7*  ALBUMIN 3.7  --   --  3.2* 3.1*  AST 30  --   --  73* 46*  ALT 19  --   --  25 38  ALKPHOS 67  --   --  59 60  BILITOT 1.3*  --   --  1.8* 1.5*  GFRNONAA 49*  --  >60 58* 55*  GFRAA 56*  --  >60 >60 >60  ANIONGAP 9  --  < > = values in this interval not displayed.   Hematology Recent Labs Lab 10/02/16 0455 10/03/16 0159 10/06/16 0334  WBC 8.7 9.9 8.8  RBC 4.26 4.32 4.13*  HGB 12.7* 12.9* 12.4*  HCT 37.7* 38.2* 36.9*  MCV 88.5 88.4 89.3  MCH 29.8 29.9 30.0  MCHC 33.7 33.8 33.6  RDW 13.7 13.8 14.3  PLT 142* 150 148*    Cardiac Enzymes Recent Labs Lab 10/01/16 1626 10/01/16 1950 10/02/16 0455  TROPONINI 0.42* 28.46* 47.50*    Recent Labs Lab 10/01/16 1635  TROPIPOC 0.19*     BNP Recent Labs Lab 10/01/16 1627 10/01/16 1950 10/03/16 0159  BNP 59.7 82.7 269.4*     DDimer No results for input(s): DDIMER in the last 168 hours.   Radiology    No results found.  Cardiac Studies   Cath-  Conclusion    Acute coronary syndrome initially presenting with anterior ST elevation, spontaneously resolving by arrival in the emergency room.  Greater than 50% ostial left main associated with catheter damping and bradycardia with engagement and contrast injection.  90+ percent mid LAD, thrombus filled culprit lesion.  90% ostial large branching obtuse marginal. Her complex is dominant giving the PDA.  Normal, widely patent nondominant RCA.  Left ventricular dysfunction with mid anterior wall to the apical akinesis, EF 35-45%. The regional wall motion abnormality is due to stunned muscle from transient LAD occlusion.  RECOMMENDATIONS:   TCTS consultation (spoke to Dr. Tyrone Sage) for consideration of elective bypass surgery next week.  Resume IV heparin and continue IV nitroglycerin.  A loading dose of Brilinta was used before  the decision to consider surgery was made.  Continue low-dose beta blocker therapy.    2D Echo-   Study Conclusions  - Left ventricle: The cavity size was normal. There was mild concentric hypertrophy. Systolic function was mildly reduced. The estimated ejection fraction was in the range of 45% to 50%. Wall motion was normal; there were no regional wall motion abnormalities. Doppler parameters are consistent with abnormal left ventricular relaxation (grade 1 diastolic dysfunction). There was no evidence of elevated ventricular filling pressure by Doppler parameters. - Aortic valve: Trileaflet; mildly thickened, mildly calcified leaflets. There was moderate regurgitation. - Aortic root: The aortic root was normal in size. - Mitral valve: Structurally normal valve. There was trivial regurgitation. - Right ventricle: The cavity size was normal. Wall thickness was normal. Systolic function was normal. - Tricuspid valve: There was mild regurgitation. - Pulmonary arteries: Systolic pressure was mildly increased. PA peak pressure: 31 mm Hg (S). - Inferior vena cava: The vessel was normal in size. - Pericardium, extracardiac: There was no pericardial effusion.  Impressions:  - There is hypokinesis of all of the apical segments. Overall LVEF is mildly decreased at 45-50%.   Patient Profile     83 y.o. male with no prior cardiac history admitted with aborted anterior myocardial infarction.  Assessment & Plan    1. CAD  - Cath showed moderate ostial left main disease, high-grade LAD disease with thrombus. Dr. Katrinka Blazing felt bypass grafting was the best revascularization option. Dr. Tyrone Sage saw the patient in consultation and agreed. He did receive a loading dose of Brillinta. Plan for CABG tomorrow. Chest pain free on IV nitro and IV heparin.   2. ICM - EF of 35% by Cardiac catheterization. Echo showed EF of 45-50% with apical hypokinesis. On low dose BB.  ACE inhibitor will be added prior to discharge from the hospital.   Signed, [place], PA  10/06/2016, 8:02 AM    Agree with note by Chelsea Aus PA-C  Anterior STEMI with surgical anatomy and moderate LV dysfunction by 2-D echo. He also has moderate aortic insufficiency. He remains a symptomatic on IV heparin and nitroglycerin. Plans for coronary artery  bypass grafting tomorrow.  Runell Gess, M.D., FACP, [facility], [facility] [address] [place], [place]  [address]  [phone] 10/06/2016 9:08 AM

## 2016-10-06 NOTE — Progress Notes (Signed)
Patient ID: Jason [name], male   DOB: December 15, 1929, 83 y.o.   MRN: [id number] TCTS DAILY ICU PROGRESS NOTE                   [address]            [place] [address]          [phone]   5 Days Post-Op Procedure(s) (LRB): Left Heart Cath and Coronary Angiography (N/A)  Total Length of Stay:  LOS: 5 days   Subjective: No chest pain   Objective: Vital signs in last 24 hours: Temp:  [97.4 F (36.3 C)-98.3 F (36.8 C)] 98.2 F (36.8 C) (04/12 0700) Pulse Rate:  [48-68] 60 (04/12 0600) Cardiac Rhythm: Normal sinus rhythm (04/12 0600) Resp:  [12-28] 15 (04/12 0600) BP: (96-132)/(40-78) 113/59 (04/12 0600) SpO2:  [92 %-99 %] 98 % (04/12 0600)  Filed Weights   10/01/16 1621 10/01/16 1730  Weight: 155 lb (70.3 kg) 152 lb 1.9 oz (69 kg)    Weight change:    Hemodynamic parameters for last 24 hours:    Intake/Output from previous day: 04/11 0701 - 04/12 0700 In: 1201.5 [P.O.:960; I.V.:241.5] Out: 925 [Urine:925]  Intake/Output this shift: No intake/output data recorded.  Current Meds: Scheduled Meds: . aspirin  81 mg Oral Daily  . atorvastatin  80 mg Oral q1800  . bisacodyl  5 mg Oral Once  . [START ON 10/07/2016] cefUROXime (ZINACEF)  IV  750 mg Intravenous To OR  . [START ON 10/07/2016] chlorhexidine  15 mL Mouth/Throat Once  . Chlorhexidine Gluconate Cloth  6 each Topical Once   And  . Chlorhexidine Gluconate Cloth  6 each Topical Once  . [START ON 10/07/2016] dexmedetomidine  0.1-0.7 mcg/kg/hr Intravenous To OR  . docusate sodium  50 mg Oral Daily  . [START ON 10/07/2016] DOPamine  0-10 mcg/kg/min Intravenous To OR  . [START ON 10/07/2016] epinephrine  0-10 mcg/min Intravenous To OR  . [START ON 10/07/2016] heparin-papaverine-plasmalyte irrigation   Irrigation To OR  . [START ON 10/07/2016] heparin 30,000 units/NS 1000 mL solution for CELLSAVER   Other To OR  . [START ON 10/07/2016] insulin (NOVOLIN-R) infusion   Intravenous To OR  . [START ON  10/07/2016] magnesium sulfate  40 mEq Other To OR  . metoprolol tartrate  12.5 mg Oral BID  . [START ON 10/07/2016] metoprolol tartrate  12.5 mg Oral Once  . [START ON 10/07/2016] nitroGLYCERIN  2-200 mcg/min Intravenous To OR  . [START ON 10/07/2016] phenylephrine /283mL NS (0.08mg /ml) infusion  30-200 mcg/min Intravenous To OR  . [START ON 10/07/2016] potassium chloride  80 mEq Other To OR  . sodium chloride flush  3 mL Intravenous Q12H  . [START ON 10/07/2016] tranexamic acid (CYKLOKAPRON) infusion ([facility])  1.5 mg/kg/hr Intravenous To OR  . [START ON 10/07/2016] tranexamic acid  15 mg/kg Intravenous To OR  . [START ON 10/07/2016] tranexamic acid  2 mg/kg Intracatheter To OR   Continuous Infusions: . heparin 1,000 Units/hr (10/06/16 0610)  . nitroGLYCERIN 5 mcg/min (10/05/16 0700)   PRN Meds:.sodium chloride, acetaminophen, hydrocortisone-pramoxine, magnesium hydroxide, nitroGLYCERIN, ondansetron (ZOFRAN) IV, sodium chloride flush, temazepam, zolpidem  General appearance: alert and cooperative Neurologic: intact Heart: regular rate and rhythm, S1, S2 normal, no murmur, click, rub or gallop Lungs: clear to auscultation bilaterally Abdomen: soft, non-tender; bowel sounds normal; no masses,  no organomegaly Extremities: extremities normal, atraumatic, no cyanosis or edema and Homans sign is negative, no sign of DVT Wound:  cath site ok  Lab Results: CBC: Recent Labs  10/06/16 0334  WBC 8.8  HGB 12.4*  HCT 36.9*  PLT 148*   BMET:  Recent Labs  10/06/16 0334  NA 138  K 4.0  CL 107  CO2 23  GLUCOSE 98  BUN 17  CREATININE 1.18  CALCIUM 8.3*    CMET: Lab Results  Component Value Date   WBC 8.8 10/06/2016   HGB 12.4 (L) 10/06/2016   HCT 36.9 (L) 10/06/2016   PLT 148 (L) 10/06/2016   GLUCOSE 98 10/06/2016   CHOL 173 10/02/2016   TRIG 139 10/02/2016   HDL 42 10/02/2016   LDLCALC 103 (H) 10/02/2016   ALT 38 10/06/2016   AST 46 (H) 10/06/2016   NA 138 10/06/2016   K 4.0  10/06/2016   CL 107 10/06/2016   CREATININE 1.18 10/06/2016   BUN 17 10/06/2016   CO2 23 10/06/2016   TSH 6.063 (H) 10/01/2016   INR 0.99 10/01/2016   HGBA1C 5.3 10/01/2016      PT/INR: No results for input(s): LABPROT, INR in the last 72 hours. Radiology: No results found.   Assessment/Plan: S/P Procedure(s) (LRB): Left Heart Cath and Coronary Angiography (N/A)  I have discussed in detail CABG with patient and his wife. Plan to proceed in am,   The goals risks and alternatives of the planned surgical procedure Procedure(s): Left Heart Cath and Coronary Angiography (N/A)  have been discussed with the patient in detail. The risks of the procedure including death, infection, stroke, myocardial infarction, bleeding, blood transfusion have all been discussed specifically.  I have quoted Jason [name] a 3% of perioperative mortality and a complication rate as high as 40%. The patient's questions have been answered.Jason [name] is willing  to proceed with the planned procedure.      Jason [name] 10/06/2016 10:49 AM

## 2016-10-06 NOTE — Progress Notes (Signed)
ANTICOAGULATION CONSULT NOTE - Follow Up Consult  Pharmacy Consult for heparin Indication: CAD awaiting CABG  Labs:  Recent Labs  10/04/16 0519 10/05/16 0312 10/06/16 0334  HGB  --   --  12.4*  HCT  --   --  36.9*  PLT  --   --  148*  HEPARINUNFRC 0.46 0.38 0.27*    Assessment: 83yo male subtherapeutic on heparin after several levels at goal though had been trending down.  Goal of Therapy:  Heparin level 0.3-0.7 units/ml   Plan:  Will increase heparin gtt by 1-2 units/kg to 1000 units/hr and check level in 8hr.  Vernard Gambles, PharmD, BCPS  10/06/2016,5:35 AM

## 2016-10-06 NOTE — Anesthesia Preprocedure Evaluation (Addendum)
Anesthesia Evaluation  Patient identified by MRN, date of birth, ID band Patient awake    Reviewed: Allergy & Precautions, NPO status , Patient's Chart, lab work & pertinent test results  History of Anesthesia Complications Negative for: history of anesthetic complications  Airway Mallampati: II  TM Distance: >3 FB Neck ROM: Full    Dental  (+) Dental Advisory Given   Pulmonary COPD, former smoker,    breath sounds clear to auscultation       Cardiovascular + CAD (50% LM, 90%, LAD, 90% PDA) and + Past MI   Rhythm:Regular Rate:Normal  EF 35-45% by cath 10/03/16 ECHO: EF 45-50%, mod AI, mild TR   Neuro/Psych Anxiety Depression negative neurological ROS     GI/Hepatic Neg liver ROS, GERD  Poorly Controlled,  Endo/Other  negative endocrine ROS  Renal/GU negative Renal ROS     Musculoskeletal  (+) Arthritis ,   Abdominal   Peds  Hematology negative hematology ROS (+)   Anesthesia Other Findings   Reproductive/Obstetrics                            Anesthesia Physical Anesthesia Plan  ASA: III  Anesthesia Plan: General   Post-op Pain Management:    Induction: Intravenous  Airway Management Planned: Oral ETT  Additional Equipment: Arterial line, PA Cath, TEE and Ultrasound Guidance Line Placement  Intra-op Plan:   Post-operative Plan: Post-operative intubation/ventilation  Informed Consent: I have reviewed the patients History and Physical, chart, labs and discussed the procedure including the risks, benefits and alternatives for the proposed anesthesia with the patient or authorized representative who has indicated his/her understanding and acceptance.   Dental advisory given  Plan Discussed with: CRNA and Surgeon  Anesthesia Plan Comments: (Plan routine monitors, A line, PA cath, GETA with TEE and post op ventilation)        Anesthesia Quick Evaluation

## 2016-10-06 NOTE — Progress Notes (Signed)
ANTICOAGULATION CONSULT NOTE - Follow Up Consult  Pharmacy Consult for Heparin Indication: chest pain/ACS  Allergies  Allergen Reactions  . Bee Pollen Anaphylaxis  . Tetanus Toxoids Swelling    SWELLING REACTION UNSPECIFIED ? LOCAL REACTION ?  . Hydrocodone Other (See Comments)    Confusion, "talking out of his head"    Patient Measurements: Height:  (162.6 cm) Weight: 152 lb 1.9 oz (69 kg) IBW/kg (Calculated) : 59.2 Heparin Dosing Weight: 70.3 kg  Vital Signs: Temp: 98.1 F (36.7 C) (04/12 1100) Temp Source: Oral (04/12 1100) BP: 104/59 (04/12 1500) Pulse Rate: 53 (04/12 1500)  Labs:  Recent Labs  10/05/16 0312 10/06/16 0334 10/06/16 1413  HGB  --  12.4*  --   HCT  --  36.9*  --   PLT  --  148*  --   HEPARINUNFRC 0.38 0.27* 0.55  CREATININE  --  1.18  --     Estimated Creatinine Clearance: 39 mL/min (by C-G formula based on SCr of 1.18 mg/dL).   Medical History: Past Medical History:  Diagnosis Date  . Anxiety    takes Diazepam daily prn  . Arthritis   . Constipation   . Depression    takes Trazodone nightly  . Enlarged prostate   . GERD (gastroesophageal reflux disease)   . H/O hiatal hernia    takes Omeprazole daily  . Hemorrhoids   . History of colitis    many yrs ago  . History of colon polyps   . History of kidney stones    passed on his own  . Hyperlipidemia    was on medication but has been off for a while  . Insomnia    takes Trazodone nightly  . 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 Urinary frequency     Medications:  Prescriptions Prior to Admission  Medication Sig Dispense Refill Last Dose  . cyclobenzaprine (FLEXERIL) 5 MG tablet Take 1 tablet (5 mg total) by mouth at bedtime. (Patient not taking: Reported on 03/10/2015) 10 tablet 0 Not Taking at Unknown time  . doxycycline (VIBRA-TABS) 100 MG tablet Take 1 tablet (100 mg total) by mouth 2 (two) times daily. Do not lie down for 1 hour after meds (Patient not taking:  Reported on 03/10/2015) 14 tablet 0 Not Taking at Unknown time   Scheduled:  . aspirin  81 mg Oral Daily  . atorvastatin  80 mg Oral q1800  . bisacodyl  5 mg Oral Once  . [START ON 10/07/2016] cefUROXime (ZINACEF)  IV  1.5 g Intravenous To OR  . [START ON 10/07/2016] cefUROXime (ZINACEF)  IV  750 mg Intravenous To OR  . [START ON 10/07/2016] chlorhexidine  15 mL Mouth/Throat Once  . Chlorhexidine Gluconate Cloth  6 each Topical Once   And  . Chlorhexidine Gluconate Cloth  6 each Topical Once  . [START ON 10/07/2016] dexmedetomidine  0.1-0.7 mcg/kg/hr Intravenous To OR  . docusate sodium  50 mg Oral Daily  . [START ON 10/07/2016] DOPamine  0-10 mcg/kg/min Intravenous To OR  . [START ON 10/07/2016] epinephrine  0-10 mcg/min Intravenous To OR  . [START ON 10/07/2016] heparin-papaverine-plasmalyte irrigation   Irrigation To OR  . [START ON 10/07/2016] heparin 30,000 units/NS 1000 mL solution for CELLSAVER   Other To OR  . [START ON 10/07/2016] insulin (NOVOLIN-R) infusion   Intravenous To OR  . [START ON 10/07/2016] magnesium sulfate  40 mEq Other To OR  . metoprolol tartrate  12.5 mg Oral BID  . [  START ON 10/07/2016] metoprolol tartrate  12.5 mg Oral Once  . [START ON 10/07/2016] nitroGLYCERIN  2-200 mcg/min Intravenous To OR  . [START ON 10/07/2016] phenylephrine /266mL NS (0.08mg /ml) infusion  30-200 mcg/min Intravenous To OR  . [START ON 10/07/2016] potassium chloride  80 mEq Other To OR  . sodium chloride flush  3 mL Intravenous Q12H  . [START ON 10/07/2016] tranexamic acid (CYKLOKAPRON) infusion ([facility])  1.5 mg/kg/hr Intravenous To OR  . [START ON 10/07/2016] tranexamic acid  15 mg/kg Intravenous To OR  . [START ON 10/07/2016] tranexamic acid  2 mg/kg Intracatheter To OR  . [START ON 10/07/2016] vancomycin  1,250 mg Intravenous To OR   Infusions:  . heparin 1,000 Units/hr (10/06/16 1500)  . nitroGLYCERIN 5 mcg/min (10/06/16 1500)   Assessment: 83yo male presents with CP. Pharmacy is consulted to  dose heparin for ACS/chest pain. Went to cath lab and heparin to restart post cath> TCTS consult - plan CABG tomorrow 4/13 post ticagrelor washout. CBC stable this morning with no infusion or bleeding reported.  Heparin level therapeutic: 0.55  Goal of Therapy:  Heparin level 0.3-0.7 units/ml Monitor platelets by anticoagulation protocol: Yes   Plan:  Continue heparin gtt at 1000 units/hr  Daily HL, CBC  Ruben Im, PharmD Clinical Pharmacist Pager: [phone] 10/06/2016 3:34 PM

## 2016-10-07 ENCOUNTER — Inpatient Hospital Stay (HOSPITAL_COMMUNITY): Payer: Medicare Other | Admitting: Anesthesiology

## 2016-10-07 ENCOUNTER — Inpatient Hospital Stay (HOSPITAL_COMMUNITY)
Admission: EM | Disposition: A | Discharge status: Skilled Nursing Facility | Payer: Self-pay | Source: Home / Self Care | Attending: Cardiothoracic Surgery

## 2016-10-07 ENCOUNTER — Inpatient Hospital Stay (HOSPITAL_COMMUNITY): Payer: Medicare Other

## 2016-10-07 ENCOUNTER — Encounter (HOSPITAL_COMMUNITY): Payer: Self-pay | Admitting: Certified Registered Nurse Anesthetist

## 2016-10-07 DIAGNOSIS — I25110 Atherosclerotic heart disease of native coronary artery with unstable angina pectoris: Secondary | ICD-10-CM

## 2016-10-07 DIAGNOSIS — Z951 Presence of aortocoronary bypass graft: Secondary | ICD-10-CM

## 2016-10-07 DIAGNOSIS — I213 ST elevation (STEMI) myocardial infarction of unspecified site: Secondary | ICD-10-CM

## 2016-10-07 HISTORY — PX: CORONARY ARTERY BYPASS GRAFT: SHX141

## 2016-10-07 HISTORY — PX: TEE WITHOUT CARDIOVERSION: SHX5443

## 2016-10-07 LAB — ECHO INTRAOPERATIVE TEE
Height: 64 in
Weight: 2433.88 oz

## 2016-10-07 LAB — POCT I-STAT 4, (NA,K, GLUC, HGB,HCT)
Glucose, Bld: 101 mg/dL — ABNORMAL HIGH (ref 65–99)
HCT: 22 % — ABNORMAL LOW (ref 39.0–52.0)
HEMOGLOBIN: 7.5 g/dL — AB (ref 13.0–17.0)
POTASSIUM: 3.8 mmol/L (ref 3.5–5.1)
Sodium: 140 mmol/L (ref 135–145)

## 2016-10-07 LAB — CBC
HCT: 37.4 % — ABNORMAL LOW (ref 39.0–52.0)
HCT: 27.3 % — ABNORMAL LOW (ref 39.0–52.0)
HCT: 28.5 % — ABNORMAL LOW (ref 39.0–52.0)
HEMOGLOBIN: 9.2 g/dL — AB (ref 13.0–17.0)
Hemoglobin: 12.6 g/dL — ABNORMAL LOW (ref 13.0–17.0)
Hemoglobin: 9.5 g/dL — ABNORMAL LOW (ref 13.0–17.0)
MCH: 30.3 pg (ref 26.0–34.0)
MCH: 30.3 pg (ref 26.0–34.0)
MCH: 29.8 pg (ref 26.0–34.0)
MCHC: 33.7 g/dL (ref 30.0–36.0)
MCHC: 33.7 g/dL (ref 30.0–36.0)
MCHC: 33.3 g/dL (ref 30.0–36.0)
MCV: 89.9 fL (ref 78.0–100.0)
MCV: 89.8 fL (ref 78.0–100.0)
MCV: 89.3 fL (ref 78.0–100.0)
Platelets: 139 10*3/uL — ABNORMAL LOW (ref 150–400)
Platelets: 73 10*3/uL — ABNORMAL LOW (ref 150–400)
Platelets: 83 10*3/uL — ABNORMAL LOW (ref 150–400)
RBC: 4.16 MIL/uL — ABNORMAL LOW (ref 4.22–5.81)
RBC: 3.04 MIL/uL — AB (ref 4.22–5.81)
RBC: 3.19 MIL/uL — ABNORMAL LOW (ref 4.22–5.81)
RDW: 14.4 % (ref 11.5–15.5)
RDW: 14.1 % (ref 11.5–15.5)
RDW: 14 % (ref 11.5–15.5)
WBC: 7.2 10*3/uL (ref 4.0–10.5)
WBC: 8.4 10*3/uL (ref 4.0–10.5)
WBC: 6.8 10*3/uL (ref 4.0–10.5)

## 2016-10-07 LAB — POCT I-STAT 3, ART BLOOD GAS (G3+)
ACID-BASE EXCESS: 2 mmol/L (ref 0.0–2.0)
Acid-base deficit: 3 mmol/L — ABNORMAL HIGH (ref 0.0–2.0)
Acid-base deficit: 3 mmol/L — ABNORMAL HIGH (ref 0.0–2.0)
BICARBONATE: 25.9 mmol/L (ref 20.0–28.0)
BICARBONATE: 24.8 mmol/L (ref 20.0–28.0)
Bicarbonate: 22 mmol/L (ref 20.0–28.0)
Bicarbonate: 21.6 mmol/L (ref 20.0–28.0)
O2 SAT: 96 %
O2 Saturation: 100 %
O2 Saturation: 100 %
O2 Saturation: 98 %
PH ART: 7.398 (ref 7.350–7.450)
PH ART: 7.413 (ref 7.350–7.450)
PO2 ART: 508 mmHg — AB (ref 83.0–108.0)
Patient temperature: 35.7
Patient temperature: 99
TCO2: 27 mmol/L (ref 0–100)
TCO2: 26 mmol/L (ref 0–100)
TCO2: 23 mmol/L (ref 0–100)
TCO2: 23 mmol/L (ref 0–100)
pCO2 arterial: 36.3 mmHg (ref 32.0–48.0)
pCO2 arterial: 40.3 mmHg (ref 32.0–48.0)
pCO2 arterial: 34 mmHg (ref 32.0–48.0)
pCO2 arterial: 37.1 mmHg (ref 32.0–48.0)
pH, Arterial: 7.461 — ABNORMAL HIGH (ref 7.350–7.450)
pH, Arterial: 7.374 (ref 7.350–7.450)
pO2, Arterial: 421 mmHg — ABNORMAL HIGH (ref 83.0–108.0)
pO2, Arterial: 77 mmHg — ABNORMAL LOW (ref 83.0–108.0)
pO2, Arterial: 110 mmHg — ABNORMAL HIGH (ref 83.0–108.0)

## 2016-10-07 LAB — POCT I-STAT, CHEM 8
BUN: 18 mg/dL (ref 6–20)
BUN: 12 mg/dL (ref 6–20)
BUN: 13 mg/dL (ref 6–20)
BUN: 13 mg/dL (ref 6–20)
BUN: 10 mg/dL (ref 6–20)
CALCIUM ION: 1.14 mmol/L — AB (ref 1.15–1.40)
CALCIUM ION: 1.01 mmol/L — AB (ref 1.15–1.40)
CALCIUM ION: 1.06 mmol/L — AB (ref 1.15–1.40)
CREATININE: 0.6 mg/dL — AB (ref 0.61–1.24)
CREATININE: 0.7 mg/dL (ref 0.61–1.24)
CREATININE: 0.8 mg/dL (ref 0.61–1.24)
Calcium, Ion: 0.88 mmol/L — CL (ref 1.15–1.40)
Calcium, Ion: 1.14 mmol/L — ABNORMAL LOW (ref 1.15–1.40)
Chloride: 105 mmol/L (ref 101–111)
Chloride: 102 mmol/L (ref 101–111)
Chloride: 103 mmol/L (ref 101–111)
Chloride: 103 mmol/L (ref 101–111)
Chloride: 105 mmol/L (ref 101–111)
Creatinine, Ser: 1 mg/dL (ref 0.61–1.24)
Creatinine, Ser: 0.9 mg/dL (ref 0.61–1.24)
GLUCOSE: 98 mg/dL (ref 65–99)
GLUCOSE: 134 mg/dL — AB (ref 65–99)
Glucose, Bld: 100 mg/dL — ABNORMAL HIGH (ref 65–99)
Glucose, Bld: 123 mg/dL — ABNORMAL HIGH (ref 65–99)
Glucose, Bld: 162 mg/dL — ABNORMAL HIGH (ref 65–99)
HCT: 23 % — ABNORMAL LOW (ref 39.0–52.0)
HCT: 23 % — ABNORMAL LOW (ref 39.0–52.0)
HEMATOCRIT: 31 % — AB (ref 39.0–52.0)
HEMATOCRIT: 22 % — AB (ref 39.0–52.0)
HEMATOCRIT: 24 % — AB (ref 39.0–52.0)
HEMOGLOBIN: 8.2 g/dL — AB (ref 13.0–17.0)
Hemoglobin: 10.5 g/dL — ABNORMAL LOW (ref 13.0–17.0)
Hemoglobin: 7.5 g/dL — ABNORMAL LOW (ref 13.0–17.0)
Hemoglobin: 7.8 g/dL — ABNORMAL LOW (ref 13.0–17.0)
Hemoglobin: 7.8 g/dL — ABNORMAL LOW (ref 13.0–17.0)
POTASSIUM: 3.9 mmol/L (ref 3.5–5.1)
Potassium: 4.2 mmol/L (ref 3.5–5.1)
Potassium: 4.5 mmol/L (ref 3.5–5.1)
Potassium: 4 mmol/L (ref 3.5–5.1)
Potassium: 4.4 mmol/L (ref 3.5–5.1)
SODIUM: 134 mmol/L — AB (ref 135–145)
Sodium: 140 mmol/L (ref 135–145)
Sodium: 139 mmol/L (ref 135–145)
Sodium: 135 mmol/L (ref 135–145)
Sodium: 137 mmol/L (ref 135–145)
TCO2: 29 mmol/L (ref 0–100)
TCO2: 28 mmol/L (ref 0–100)
TCO2: 29 mmol/L (ref 0–100)
TCO2: 28 mmol/L (ref 0–100)
TCO2: 23 mmol/L (ref 0–100)

## 2016-10-07 LAB — GLUCOSE, CAPILLARY
GLUCOSE-CAPILLARY: 97 mg/dL (ref 65–99)
GLUCOSE-CAPILLARY: 113 mg/dL — AB (ref 65–99)
GLUCOSE-CAPILLARY: 116 mg/dL — AB (ref 65–99)

## 2016-10-07 LAB — BASIC METABOLIC PANEL
Anion gap: 7 (ref 5–15)
BUN: 16 mg/dL (ref 6–20)
CO2: 24 mmol/L (ref 22–32)
Calcium: 8.1 mg/dL — ABNORMAL LOW (ref 8.9–10.3)
Chloride: 107 mmol/L (ref 101–111)
Creatinine, Ser: 1.19 mg/dL (ref 0.61–1.24)
GFR calc Af Amer: >60 mL/min (ref >60)
GFR calc non Af Amer: 54 mL/min — ABNORMAL LOW (ref >60)
Glucose, Bld: 95 mg/dL (ref 65–99)
Potassium: 3.9 mmol/L (ref 3.5–5.1)
Sodium: 138 mmol/L (ref 135–145)

## 2016-10-07 LAB — APTT: aPTT: 35 seconds (ref 24–36)

## 2016-10-07 LAB — PREPARE RBC (CROSSMATCH)

## 2016-10-07 LAB — PROTIME-INR
INR: 1.55
PROTHROMBIN TIME: 18.8 s — AB (ref 11.4–15.2)

## 2016-10-07 LAB — CREATININE, SERUM
Creatinine, Ser: 0.98 mg/dL (ref 0.61–1.24)
GFR calc Af Amer: >60 mL/min (ref >60)
GFR calc non Af Amer: >60 mL/min (ref >60)

## 2016-10-07 LAB — PLATELET COUNT: Platelets: 100 10*3/uL — ABNORMAL LOW (ref 150–400)

## 2016-10-07 LAB — HEPARIN LEVEL (UNFRACTIONATED): Heparin Unfractionated: 0.56 IU/mL (ref 0.30–0.70)

## 2016-10-07 LAB — HEMOGLOBIN AND HEMATOCRIT, BLOOD
HCT: 25.6 % — ABNORMAL LOW (ref 39.0–52.0)
Hemoglobin: 8.6 g/dL — ABNORMAL LOW (ref 13.0–17.0)

## 2016-10-07 LAB — MAGNESIUM: Magnesium: 2.6 mg/dL — ABNORMAL HIGH (ref 1.7–2.4)

## 2016-10-07 SURGERY — CORONARY ARTERY BYPASS GRAFTING (CABG)
Anesthesia: General | Site: Chest

## 2016-10-07 MED ORDER — PROPOFOL 10 MG/ML IV BOLUS
INTRAVENOUS | Status: DC | PRN
  Administered 2016-10-07: 20 mg via INTRAVENOUS

## 2016-10-07 MED ORDER — ACETAMINOPHEN 160 MG/5ML PO SOLN: 1000.0000 mg | Freq: Four times a day (QID) | ORAL | Status: AC

## 2016-10-07 MED ORDER — PROPOFOL 10 MG/ML IV BOLUS
INTRAVENOUS | Status: AC
  Filled 2016-10-07: qty 20

## 2016-10-07 MED ORDER — SODIUM CHLORIDE 0.9 % IV SOLN: 250.0000 mL | INTRAVENOUS | Status: DC

## 2016-10-07 MED ORDER — ORAL CARE MOUTH RINSE
15.0000 mL | Freq: Two times a day (BID) | OROMUCOSAL | Status: DC
  Administered 2016-10-07 – 2016-10-19 (×19): 15 mL via OROMUCOSAL

## 2016-10-07 MED ORDER — FENTANYL CITRATE (PF) 250 MCG/5ML IJ SOLN
INTRAMUSCULAR | Status: AC
Start: 2016-10-07 — End: 2016-10-07
  Filled 2016-10-07: qty 25

## 2016-10-07 MED ORDER — ARTIFICIAL TEARS OP OINT
TOPICAL_OINTMENT | OPHTHALMIC | Status: DC | PRN
  Administered 2016-10-07: 1 via OPHTHALMIC

## 2016-10-07 MED ORDER — SODIUM CHLORIDE 0.9 % IV SOLN
INTRAVENOUS | Status: DC
  Administered 2016-10-09: 06:00:00 via INTRAVENOUS

## 2016-10-07 MED ORDER — ROCURONIUM BROMIDE 50 MG/5ML IV SOSY
PREFILLED_SYRINGE | INTRAVENOUS | Status: AC
  Filled 2016-10-07: qty 5

## 2016-10-07 MED ORDER — EPHEDRINE SULFATE 50 MG/ML IJ SOLN
INTRAMUSCULAR | Status: DC | PRN
  Administered 2016-10-07: 5 mg via INTRAVENOUS
  Administered 2016-10-07: 10 mg via INTRAVENOUS
  Administered 2016-10-07 (×4): 5 mg via INTRAVENOUS

## 2016-10-07 MED ORDER — HEPARIN SODIUM (PORCINE) 1000 UNIT/ML IJ SOLN
INTRAMUSCULAR | Status: DC | PRN
  Administered 2016-10-07: 24000 [IU] via INTRAVENOUS

## 2016-10-07 MED ORDER — SODIUM CHLORIDE 0.9 % IJ SOLN
INTRAMUSCULAR | Status: DC | PRN
  Administered 2016-10-07: 09:00:00 via TOPICAL

## 2016-10-07 MED ORDER — EPHEDRINE 5 MG/ML INJ
INTRAVENOUS | Status: AC
  Filled 2016-10-07: qty 10

## 2016-10-07 MED ORDER — METOPROLOL TARTRATE 5 MG/5ML IV SOLN
2.5000 mg | INTRAVENOUS | Status: DC | PRN
Start: 2016-10-07 — End: 2016-10-19

## 2016-10-07 MED ORDER — LACTATED RINGERS IV SOLN
INTRAVENOUS | Status: DC | PRN
  Administered 2016-10-07: 07:00:00 via INTRAVENOUS

## 2016-10-07 MED ORDER — ROCURONIUM BROMIDE 100 MG/10ML IV SOLN
INTRAVENOUS | Status: DC | PRN
  Administered 2016-10-07: 30 mg via INTRAVENOUS
  Administered 2016-10-07: 20 mg via INTRAVENOUS
  Administered 2016-10-07: 50 mg via INTRAVENOUS
  Administered 2016-10-07: 20 mg via INTRAVENOUS
  Administered 2016-10-07: 30 mg via INTRAVENOUS

## 2016-10-07 MED ORDER — SODIUM CHLORIDE 0.9 % IV SOLN
0.0000 ug/min | INTRAVENOUS | Status: DC
  Administered 2016-10-08: 20 ug/min via INTRAVENOUS
  Filled 2016-10-07 (×2): qty 2

## 2016-10-07 MED ORDER — ACETAMINOPHEN 160 MG/5ML PO SOLN: 650.0000 mg | Freq: Once | ORAL | Status: AC

## 2016-10-07 MED ORDER — BISACODYL 10 MG RE SUPP
10.0000 mg | Freq: Every day | RECTAL | Status: DC
Start: 2016-10-08 — End: 2016-10-20
  Filled 2016-10-07: qty 1

## 2016-10-07 MED ORDER — FAMOTIDINE IN NACL 20-0.9 MG/50ML-% IV SOLN
20.0000 mg | Freq: Two times a day (BID) | INTRAVENOUS | Status: AC
  Administered 2016-10-07: 20 mg via INTRAVENOUS

## 2016-10-07 MED ORDER — METOPROLOL TARTRATE 12.5 MG HALF TABLET
12.5000 mg | ORAL_TABLET | Freq: Two times a day (BID) | ORAL | Status: DC
  Administered 2016-10-09 – 2016-10-13 (×5): 12.5 mg via ORAL
  Filled 2016-10-07 (×5): qty 1

## 2016-10-07 MED ORDER — ATORVASTATIN CALCIUM 80 MG PO TABS
80.0000 mg | ORAL_TABLET | Freq: Every day | ORAL | Status: DC
  Administered 2016-10-08 – 2016-10-15 (×7): 80 mg via ORAL
  Filled 2016-10-07 (×8): qty 1

## 2016-10-07 MED ORDER — MAGNESIUM SULFATE 4 GM/100ML IV SOLN
4.0000 g | Freq: Once | INTRAVENOUS | Status: AC
  Administered 2016-10-07: 4 g via INTRAVENOUS
  Filled 2016-10-07: qty 100

## 2016-10-07 MED ORDER — METOPROLOL TARTRATE 25 MG/10 ML ORAL SUSPENSION: 12.5000 mg | Freq: Two times a day (BID) | ORAL | Status: DC

## 2016-10-07 MED ORDER — FENTANYL CITRATE (PF) 100 MCG/2ML IJ SOLN
25.0000 ug | INTRAMUSCULAR | Status: DC | PRN
  Administered 2016-10-08 – 2016-10-10 (×8): 25 ug via INTRAVENOUS
  Filled 2016-10-07 (×8): qty 2

## 2016-10-07 MED ORDER — 0.9 % SODIUM CHLORIDE (POUR BTL) OPTIME
TOPICAL | Status: DC | PRN
  Administered 2016-10-07: 6000 mL

## 2016-10-07 MED ORDER — INSULIN ASPART 100 UNIT/ML ~~LOC~~ SOLN: 0.0000 [IU] | SUBCUTANEOUS | Status: DC

## 2016-10-07 MED ORDER — DEXMEDETOMIDINE HCL 200 MCG/2ML IV SOLN
0.0000 ug/kg/h | INTRAVENOUS | Status: DC
  Filled 2016-10-07: qty 2

## 2016-10-07 MED ORDER — ACETAMINOPHEN 650 MG RE SUPP
650.0000 mg | Freq: Once | RECTAL | Status: AC
  Administered 2016-10-07: 650 mg via RECTAL

## 2016-10-07 MED ORDER — ALBUMIN HUMAN 5 % IV SOLN
250.0000 mL | INTRAVENOUS | Status: AC | PRN
  Administered 2016-10-07 – 2016-10-08 (×4): 250 mL via INTRAVENOUS
  Filled 2016-10-07 (×2): qty 250

## 2016-10-07 MED ORDER — HEMOSTATIC AGENTS (NO CHARGE) OPTIME
TOPICAL | Status: DC | PRN
  Administered 2016-10-07 (×2): 1 via TOPICAL

## 2016-10-07 MED ORDER — DOCUSATE SODIUM 100 MG PO CAPS
200.0000 mg | ORAL_CAPSULE | Freq: Every day | ORAL | Status: DC
  Administered 2016-10-08 – 2016-10-20 (×13): 200 mg via ORAL
  Filled 2016-10-07 (×13): qty 2

## 2016-10-07 MED ORDER — MIDAZOLAM HCL 10 MG/2ML IJ SOLN
INTRAMUSCULAR | Status: AC
  Filled 2016-10-07: qty 2

## 2016-10-07 MED ORDER — DOCUSATE SODIUM 100 MG PO CAPS: 100.0000 mg | ORAL_CAPSULE | Freq: Every day | ORAL | Status: DC

## 2016-10-07 MED ORDER — PROTAMINE SULFATE 10 MG/ML IV SOLN
INTRAVENOUS | Status: DC | PRN
  Administered 2016-10-07: 240 mg via INTRAVENOUS

## 2016-10-07 MED ORDER — INSULIN REGULAR BOLUS VIA INFUSION
0.0000 [IU] | Freq: Three times a day (TID) | INTRAVENOUS | Status: DC
  Filled 2016-10-07: qty 10

## 2016-10-07 MED ORDER — CHLORHEXIDINE GLUCONATE 0.12 % MT SOLN
15.0000 mL | OROMUCOSAL | Status: AC
  Administered 2016-10-07: 15 mL via OROMUCOSAL

## 2016-10-07 MED ORDER — ASPIRIN 81 MG PO CHEW: 324.0000 mg | CHEWABLE_TABLET | Freq: Every day | ORAL | Status: DC

## 2016-10-07 MED ORDER — MIDAZOLAM HCL 5 MG/5ML IJ SOLN: INTRAMUSCULAR | Status: DC | PRN

## 2016-10-07 MED ORDER — ASPIRIN EC 325 MG PO TBEC
325.0000 mg | DELAYED_RELEASE_TABLET | Freq: Every day | ORAL | Status: DC
  Administered 2016-10-08 – 2016-10-13 (×6): 325 mg via ORAL
  Filled 2016-10-07 (×6): qty 1

## 2016-10-07 MED ORDER — LACTATED RINGERS IV SOLN: INTRAVENOUS | Status: DC

## 2016-10-07 MED ORDER — PANTOPRAZOLE SODIUM 40 MG PO TBEC
40.0000 mg | DELAYED_RELEASE_TABLET | Freq: Every day | ORAL | Status: DC
  Administered 2016-10-09 – 2016-10-20 (×12): 40 mg via ORAL
  Filled 2016-10-07 (×12): qty 1

## 2016-10-07 MED ORDER — SODIUM CHLORIDE 0.45 % IV SOLN
INTRAVENOUS | Status: DC | PRN
  Administered 2016-10-07: 20 mL/h via INTRAVENOUS

## 2016-10-07 MED ORDER — INSULIN REGULAR HUMAN 100 UNIT/ML IJ SOLN
INTRAMUSCULAR | Status: DC
  Filled 2016-10-07: qty 2.5

## 2016-10-07 MED ORDER — MORPHINE SULFATE (PF) 4 MG/ML IV SOLN
1.0000 mg | INTRAVENOUS | Status: AC | PRN
  Administered 2016-10-07 (×2): 4 mg via INTRAVENOUS
  Filled 2016-10-07 (×3): qty 1

## 2016-10-07 MED ORDER — TRAMADOL HCL 50 MG PO TABS
50.0000 mg | ORAL_TABLET | ORAL | Status: DC | PRN
  Administered 2016-10-08: 100 mg via ORAL
  Administered 2016-10-08: 50 mg via ORAL
  Administered 2016-10-08: 100 mg via ORAL
  Administered 2016-10-09 – 2016-10-11 (×3): 50 mg via ORAL
  Administered 2016-10-11 – 2016-10-17 (×9): 100 mg via ORAL
  Administered 2016-10-18: 50 mg via ORAL
  Filled 2016-10-07: qty 2
  Filled 2016-10-07: qty 1
  Filled 2016-10-07: qty 2
  Filled 2016-10-07: qty 1
  Filled 2016-10-07 (×4): qty 2
  Filled 2016-10-07 (×2): qty 1
  Filled 2016-10-07 (×7): qty 2
  Filled 2016-10-07: qty 1

## 2016-10-07 MED ORDER — SODIUM CHLORIDE 0.9 % IJ SOLN
OROMUCOSAL | Status: DC | PRN
  Administered 2016-10-07: 09:00:00 via TOPICAL

## 2016-10-07 MED ORDER — NITROGLYCERIN IN D5W 200-5 MCG/ML-% IV SOLN: 0.0000 ug/min | INTRAVENOUS | Status: DC

## 2016-10-07 MED ORDER — SODIUM CHLORIDE 0.9% FLUSH: 3.0000 mL | INTRAVENOUS | Status: DC | PRN

## 2016-10-07 MED ORDER — VANCOMYCIN HCL IN DEXTROSE 1-5 GM/200ML-% IV SOLN
1000.0000 mg | Freq: Once | INTRAVENOUS | Status: AC
  Administered 2016-10-07: 1000 mg via INTRAVENOUS
  Filled 2016-10-07: qty 200

## 2016-10-07 MED ORDER — BISACODYL 5 MG PO TBEC
10.0000 mg | DELAYED_RELEASE_TABLET | Freq: Every day | ORAL | Status: DC
  Administered 2016-10-08 – 2016-10-17 (×10): 10 mg via ORAL
  Filled 2016-10-07 (×11): qty 2

## 2016-10-07 MED ORDER — LACTATED RINGERS IV SOLN
INTRAVENOUS | Status: DC
Start: 2016-10-07 — End: 2016-10-20

## 2016-10-07 MED ORDER — MIDAZOLAM HCL 5 MG/5ML IJ SOLN
INTRAMUSCULAR | Status: DC | PRN
  Administered 2016-10-07 (×2): 1 mg via INTRAVENOUS

## 2016-10-07 MED ORDER — MIDAZOLAM HCL 2 MG/2ML IJ SOLN
2.0000 mg | INTRAMUSCULAR | Status: DC | PRN
Start: 2016-10-07 — End: 2016-10-11

## 2016-10-07 MED ORDER — FENTANYL CITRATE (PF) 250 MCG/5ML IJ SOLN
INTRAMUSCULAR | Status: DC | PRN
  Administered 2016-10-07 (×8): 50 ug via INTRAVENOUS
  Administered 2016-10-07: 100 ug via INTRAVENOUS
  Administered 2016-10-07: 50 ug via INTRAVENOUS
  Administered 2016-10-07: 500 ug via INTRAVENOUS

## 2016-10-07 MED ORDER — NITROGLYCERIN 0.2 MG/ML ON CALL CATH LAB
INTRAVENOUS | Status: DC | PRN
  Administered 2016-10-07 (×5): 40 ug via INTRAVENOUS
  Administered 2016-10-07: 80 ug via INTRAVENOUS
  Administered 2016-10-07: 40 ug via INTRAVENOUS

## 2016-10-07 MED ORDER — DEXTROSE 5 % IV SOLN
1.5000 g | Freq: Two times a day (BID) | INTRAVENOUS | Status: AC
  Administered 2016-10-07 – 2016-10-09 (×4): 1.5 g via INTRAVENOUS
  Filled 2016-10-07 (×4): qty 1.5

## 2016-10-07 MED ORDER — ALBUMIN HUMAN 5 % IV SOLN
INTRAVENOUS | Status: DC | PRN
  Administered 2016-10-07: 12:00:00 via INTRAVENOUS

## 2016-10-07 MED ORDER — ONDANSETRON HCL 4 MG/2ML IJ SOLN
4.0000 mg | Freq: Four times a day (QID) | INTRAMUSCULAR | Status: DC | PRN
  Administered 2016-10-08 – 2016-10-10 (×3): 4 mg via INTRAVENOUS
  Filled 2016-10-07 (×3): qty 2

## 2016-10-07 MED ORDER — SODIUM CHLORIDE 0.9% FLUSH
3.0000 mL | Freq: Two times a day (BID) | INTRAVENOUS | Status: DC
  Administered 2016-10-08: 3 mL via INTRAVENOUS
  Administered 2016-10-08: 10 mL via INTRAVENOUS
  Administered 2016-10-09 (×2): 3 mL via INTRAVENOUS
  Administered 2016-10-10: 10 mL via INTRAVENOUS
  Administered 2016-10-10 – 2016-10-18 (×9): 3 mL via INTRAVENOUS

## 2016-10-07 MED ORDER — DOPAMINE-DEXTROSE 1.6-5 MG/ML-% IV SOLN
INTRAVENOUS | Status: DC | PRN
Start: 2016-10-07 — End: 2016-10-07
  Administered 2016-10-07: 2 ug/kg/min via INTRAVENOUS

## 2016-10-07 MED ORDER — SODIUM CHLORIDE 0.9 % IV SOLN
30.0000 meq | Freq: Once | INTRAVENOUS | Status: AC
  Administered 2016-10-07: 30 meq via INTRAVENOUS
  Filled 2016-10-07: qty 15

## 2016-10-07 MED ORDER — ACETAMINOPHEN 500 MG PO TABS
1000.0000 mg | ORAL_TABLET | Freq: Four times a day (QID) | ORAL | Status: AC
  Administered 2016-10-08 – 2016-10-12 (×17): 1000 mg via ORAL
  Filled 2016-10-07 (×19): qty 2

## 2016-10-07 MED ORDER — LACTATED RINGERS IV SOLN: 500.0000 mL | Freq: Once | INTRAVENOUS | Status: DC | PRN

## 2016-10-07 MED ORDER — ARTIFICIAL TEARS OP OINT
TOPICAL_OINTMENT | OPHTHALMIC | Status: AC
  Filled 2016-10-07: qty 3.5

## 2016-10-07 MED FILL — Electrolyte-R (PH 7.4) Solution: INTRAVENOUS | Qty: 3000 | Status: AC

## 2016-10-07 MED FILL — Mannitol IV Soln 20%: INTRAVENOUS | Qty: 500 | Status: AC

## 2016-10-07 MED FILL — Sodium Bicarbonate IV Soln 8.4%: INTRAVENOUS | Qty: 50 | Status: AC

## 2016-10-07 MED FILL — Potassium Chloride Inj 2 mEq/ML: INTRAVENOUS | Qty: 40 | Status: AC

## 2016-10-07 MED FILL — Heparin Sodium (Porcine) Inj 1000 Unit/ML: INTRAMUSCULAR | Qty: 30 | Status: AC

## 2016-10-07 MED FILL — Heparin Sodium (Porcine) Inj 1000 Unit/ML: INTRAMUSCULAR | Qty: 20 | Status: AC

## 2016-10-07 MED FILL — Lidocaine HCl IV Inj 20 MG/ML: INTRAVENOUS | Qty: 5 | Status: AC

## 2016-10-07 MED FILL — Magnesium Sulfate Inj 50%: INTRAMUSCULAR | Qty: 10 | Status: AC

## 2016-10-07 MED FILL — Sodium Chloride IV Soln 0.9%: INTRAVENOUS | Qty: 2000 | Status: AC

## 2016-10-07 SURGICAL SUPPLY — 76 items
AGENT HMST KT MTR STRL THRMB (HEMOSTASIS) ×2
BAG DECANTER FOR FLEXI CONT (MISCELLANEOUS) ×3 IMPLANT
BANDAGE ACE 4X5 VEL STRL LF (GAUZE/BANDAGES/DRESSINGS) ×3 IMPLANT
BANDAGE ACE 6X5 VEL STRL LF (GAUZE/BANDAGES/DRESSINGS) ×3 IMPLANT
BLADE STERNUM SYSTEM 6 (BLADE) ×3 IMPLANT
BLADE SURG 11 STRL SS (BLADE) ×3 IMPLANT
BNDG GAUZE ELAST 4 BULKY (GAUZE/BANDAGES/DRESSINGS) ×3 IMPLANT
CANISTER SUCT 3000ML PPV (MISCELLANEOUS) ×3 IMPLANT
CATH CPB KIT GERHARDT (MISCELLANEOUS) ×3 IMPLANT
CATH THORACIC 28FR (CATHETERS) ×3 IMPLANT
CRADLE DONUT ADULT HEAD (MISCELLANEOUS) ×3 IMPLANT
DRAIN CHANNEL 28F RND 3/8 FF (WOUND CARE) ×3 IMPLANT
DRAPE CARDIOVASCULAR INCISE (DRAPES) ×3
DRAPE SLUSH/WARMER DISC (DRAPES) ×3 IMPLANT
DRAPE SRG 135X102X78XABS (DRAPES) ×2 IMPLANT
DRSG AQUACEL AG ADV 3.5X14 (GAUZE/BANDAGES/DRESSINGS) ×3 IMPLANT
ELECT BLADE 4.0 EZ CLEAN MEGAD (MISCELLANEOUS) ×3
ELECT REM PT RETURN 9FT ADLT (ELECTROSURGICAL) ×6
ELECTRODE BLDE 4.0 EZ CLN MEGD (MISCELLANEOUS) ×2 IMPLANT
ELECTRODE REM PT RTRN 9FT ADLT (ELECTROSURGICAL) ×4 IMPLANT
FELT TEFLON 1X6 (MISCELLANEOUS) ×3 IMPLANT
GAUZE SPONGE 4X4 12PLY STRL (GAUZE/BANDAGES/DRESSINGS) ×6 IMPLANT
GLOVE BIO SURGEON STRL SZ 6 (GLOVE) ×6 IMPLANT
GLOVE BIO SURGEON STRL SZ 6.5 (GLOVE) ×9 IMPLANT
GLOVE BIOGEL M STER SZ 6 (GLOVE) ×12 IMPLANT
GLOVE BIOGEL PI IND STRL 6 (GLOVE) ×4 IMPLANT
GLOVE BIOGEL PI IND STRL 6.5 (GLOVE) ×8 IMPLANT
GLOVE BIOGEL PI INDICATOR 6 (GLOVE) ×2
GLOVE BIOGEL PI INDICATOR 6.5 (GLOVE) ×4
GOWN STRL REUS W/ TWL LRG LVL3 (GOWN DISPOSABLE) ×16 IMPLANT
GOWN STRL REUS W/TWL LRG LVL3 (GOWN DISPOSABLE) ×24
HEMOSTAT POWDER SURGIFOAM 1G (HEMOSTASIS) ×9 IMPLANT
HEMOSTAT SURGICEL 2X14 (HEMOSTASIS) ×3 IMPLANT
IV CATH 18G X1.75 CATHLON (IV SOLUTION) ×3 IMPLANT
KIT BASIN OR (CUSTOM PROCEDURE TRAY) ×3 IMPLANT
KIT CATH SUCT 8FR (CATHETERS) ×3 IMPLANT
KIT ROOM TURNOVER OR (KITS) ×3 IMPLANT
KIT SUCTION CATH 14FR (SUCTIONS) ×6 IMPLANT
KIT VASOVIEW HEMOPRO VH 3000 (KITS) ×3 IMPLANT
LEAD PACING MYOCARDI (MISCELLANEOUS) ×3 IMPLANT
MARKER GRAFT CORONARY BYPASS (MISCELLANEOUS) ×9 IMPLANT
NS IRRIG 1000ML POUR BTL (IV SOLUTION) ×18 IMPLANT
PACK OPEN HEART (CUSTOM PROCEDURE TRAY) ×3 IMPLANT
PAD ARMBOARD 7.5X6 YLW CONV (MISCELLANEOUS) ×6 IMPLANT
PAD ELECT DEFIB RADIOL ZOLL (MISCELLANEOUS) ×3 IMPLANT
PENCIL BUTTON HOLSTER BLD 10FT (ELECTRODE) ×3 IMPLANT
PUNCH AORTIC ROTATE  4.5MM 8IN (MISCELLANEOUS) ×3 IMPLANT
SET CARDIOPLEGIA MPS 5001102 (MISCELLANEOUS) ×3 IMPLANT
SOLUTION ANTI FOG 6CC (MISCELLANEOUS) ×3 IMPLANT
SPONGE LAP 18X18 X RAY DECT (DISPOSABLE) ×9 IMPLANT
SURGIFLO W/THROMBIN 8M KIT (HEMOSTASIS) ×3 IMPLANT
SUT BONE WAX W31G (SUTURE) ×3 IMPLANT
SUT MNCRL AB 4-0 PS2 18 (SUTURE) ×9 IMPLANT
SUT PROLENE 3 0 SH1 36 (SUTURE) ×3 IMPLANT
SUT PROLENE 4 0 TF (SUTURE) ×6 IMPLANT
SUT PROLENE 6 0 C 1 30 (SUTURE) ×12 IMPLANT
SUT PROLENE 6 0 CC (SUTURE) ×9 IMPLANT
SUT PROLENE 7 0 BV1 MDA (SUTURE) ×6 IMPLANT
SUT PROLENE 8 0 BV175 6 (SUTURE) ×9 IMPLANT
SUT STEEL 6MS V (SUTURE) ×3 IMPLANT
SUT STEEL SZ 6 DBL 3X14 BALL (SUTURE) ×3 IMPLANT
SUT VIC AB 1 CTX 18 (SUTURE) ×6 IMPLANT
SUT VIC AB 2-0 CT1 27 (SUTURE) ×6
SUT VIC AB 2-0 CT1 TAPERPNT 27 (SUTURE) ×4 IMPLANT
SUTURE E-PAK OPEN HEART (SUTURE) ×3 IMPLANT
SYSTEM SAHARA CHEST DRAIN ATS (WOUND CARE) ×3 IMPLANT
TAPE CLOTH SURG 4X10 WHT LF (GAUZE/BANDAGES/DRESSINGS) ×3 IMPLANT
TAPE PAPER 2X10 WHT MICROPORE (GAUZE/BANDAGES/DRESSINGS) ×3 IMPLANT
TOWEL GREEN STERILE (TOWEL DISPOSABLE) ×3 IMPLANT
TOWEL GREEN STERILE FF (TOWEL DISPOSABLE) IMPLANT
TOWEL OR 17X24 6PK STRL BLUE (TOWEL DISPOSABLE) IMPLANT
TOWEL OR 17X26 10 PK STRL BLUE (TOWEL DISPOSABLE) IMPLANT
TRAY FOLEY SILVER 16FR TEMP (SET/KITS/TRAYS/PACK) ×3 IMPLANT
TUBING INSUFFLATION (TUBING) ×3 IMPLANT
UNDERPAD 30X30 (UNDERPADS AND DIAPERS) ×3 IMPLANT
WATER STERILE IRR 1000ML POUR (IV SOLUTION) ×6 IMPLANT

## 2016-10-07 NOTE — Brief Op Note (Signed)
10/01/2016 - 10/07/2016  11:12 AM  PATIENT:  Jason [name]  83 y.o. male  PRE-OPERATIVE DIAGNOSIS:  CAD  POST-OPERATIVE DIAGNOSIS:  CAD  PROCEDURE:  Procedure(s): CORONARY ARTERY BYPASS GRAFTING (CABG) x 3 , using internal mammary, and right greater saphenous vein harvested endoscopically SVG-PD, SVG-OM, LIMA-LAD (N/A) TRANSESOPHAGEAL ECHOCARDIOGRAM (TEE) (N/A)  LIMA to LAD SVG to OM1 SVG to PDA  SURGEON:  Surgeon(s) and Role:    * [name], MD - Primary  PHYSICIAN ASSISTANT:  Jari Favre, PA-C   ANESTHESIA:   general  EBL:  Total I/O In: 1800 [I.V.:1800] Out: 125 [Urine:125]  BLOOD ADMINISTERED:none  DRAINS: ROUTINE   LOCAL MEDICATIONS USED:  NONE  SPECIMEN:  No Specimen  DISPOSITION OF SPECIMEN:  N/A  COUNTS:  YES  TOURNIQUET:  * No tourniquets in log *  DICTATION: .Dragon Dictation  PLAN OF CARE: Admit to inpatient   PATIENT DISPOSITION:  ICU - intubated and hemodynamically stable.   Delay start of Pharmacological VTE agent (>24hrs) due to surgical blood loss or risk of bleeding: yes

## 2016-10-07 NOTE — Anesthesia Procedure Notes (Signed)
Central Venous Catheter Insertion Performed by: Chaney Malling Harumi Yamin, anesthesiologist Start/End4/13/2018 6:50 AM, 10/07/2016 7:01 AM Patient location: Pre-op. Preanesthetic checklist: patient identified, IV checked, site marked, risks and benefits discussed, surgical consent, monitors and equipment checked, pre-op evaluation, timeout performed and anesthesia consent Position: Trendelenburg Lidocaine 1% used for infiltration and patient sedated Hand hygiene performed , maximum sterile barriers used  and Seldinger technique used Catheter size: 8.5 Fr Central line and PA cath was placed.Sheath introducer Swan type:thermodilation Procedure performed using ultrasound guided technique. Ultrasound Notes:anatomy identified, needle tip was noted to be adjacent to the nerve/plexus identified, no ultrasound evidence of intravascular and/or intraneural injection and image(s) printed for medical record Attempts: 1 Following insertion, line sutured, dressing applied and Biopatch. Post procedure assessment: blood return through all ports, free fluid flow and no air  Patient tolerated the procedure well with no immediate complications.

## 2016-10-07 NOTE — Progress Notes (Signed)
      [address]       [place] [address]             [phone]      Intubated, sedated  s/p CABG x 3  BP (!) 99/53   Pulse 86   Temp 97.3 F (36.3 C)   Resp 12   Ht  (1.626 m)   Wt 152 lb 1.9 oz (69 kg)   SpO2 100%   BMI 26.11 kg/m   Intake/Output Summary (Last 24 hours) at 10/07/16 1716 Last data filed at 10/07/16 1700  Gross per 24 hour  Intake          5151.29 ml  Output             2575 ml  Net          2576.29 ml   Stable early postop  Viviann Spare C. Dorris Fetch, MD [facility] [phone]

## 2016-10-07 NOTE — Anesthesia Postprocedure Evaluation (Addendum)
Anesthesia Post Note  Patient: Jason [name]  Procedure(s) Performed: Procedure(s) (LRB): CORONARY ARTERY BYPASS GRAFTING (CABG) x 3 , using internal mammary, and right greater saphenous vein harvested endoscopically SVG-PD, SVG-OM, LIMA-LAD (N/A) TRANSESOPHAGEAL ECHOCARDIOGRAM (TEE) (N/A)  Patient location during evaluation: SICU Anesthesia Type: General Level of consciousness: sedated and patient remains intubated per anesthesia plan (slow to awaken) Pain management: pain level controlled Vital Signs Assessment: post-procedure vital signs reviewed and stable Respiratory status: patient remains intubated per anesthesia plan and patient on ventilator - see flowsheet for VS Cardiovascular status: blood pressure returned to baseline and stable Postop Assessment: no signs of nausea or vomiting Anesthesia complication: slow to awaken, continue to follow.       Last Vitals:  Vitals:   10/07/16 1625 10/07/16 1700  BP: (!) 104/56 (!) 99/53  Pulse: 86 86  Resp: 12 12  Temp: 36.2 C 36.3 C    Last Pain:  Vitals:   10/07/16 1400  TempSrc: Core (Comment)  PainSc:                  Jason [name],E. Jason [name]

## 2016-10-07 NOTE — Anesthesia Procedure Notes (Signed)
Procedure Name: Intubation Date/Time: 10/07/2016 7:43 AM Performed by: Tressia Miners LEFFEW Pre-anesthesia Checklist: Patient identified, Patient being monitored, Timeout performed, Emergency Drugs available and Suction available Patient Re-evaluated:Patient Re-evaluated prior to inductionOxygen Delivery Method: Circle System Utilized Preoxygenation: Pre-oxygenation with 100% oxygen Intubation Type: IV induction Ventilation: Oral airway inserted - appropriate to patient size and Two handed mask ventilation required Laryngoscope Size: Mac and 4 Grade View: Grade II Tube type: Oral Tube size: 8.0 mm Number of attempts: 1 Airway Equipment and Method: Stylet Placement Confirmation: ETT inserted through vocal cords under direct vision,  positive ETCO2 and breath sounds checked- equal and bilateral Secured at: 24 cm Tube secured with: Tape Dental Injury: Teeth and Oropharynx as per pre-operative assessment

## 2016-10-07 NOTE — Procedures (Signed)
Extubation Procedure Note  Patient Details:   Name: GEREMY RISTER DOB: [birth year] MRN: [id number]   Airway Documentation:     Evaluation  O2 sats: stable throughout Complications: No apparent complications Patient did tolerate procedure well. Bilateral Breath Sounds: Clear, Diminished   Yes    Patient performed NIF -38 and VC 1.3L with great patient effort. Patient had a positive cuff leak and was extubated to a 4 L [place].  Patient was able to perform IS with good effort as well achieving on each time.   Epifanio Lesches A 10/07/2016, 10:25 PM

## 2016-10-07 NOTE — OR Nursing (Signed)
SICU RN 20 min call, going to room 17.

## 2016-10-07 NOTE — Progress Notes (Signed)
  Echocardiogram Echocardiogram Transesophageal has been performed.  Delcie Roch 10/07/2016, 8:44 AM

## 2016-10-07 NOTE — Transfer of Care (Signed)
Immediate Anesthesia Transfer of Care Note  Patient: Jason [name]  Procedure(s) Performed: Procedure(s): CORONARY ARTERY BYPASS GRAFTING (CABG) x 3 , using internal mammary, and right greater saphenous vein harvested endoscopically SVG-PD, SVG-OM, LIMA-LAD (N/A) TRANSESOPHAGEAL ECHOCARDIOGRAM (TEE) (N/A)  Patient Location: ICU  Anesthesia Type:General  Level of Consciousness: Patient remains intubated per anesthesia plan  Airway & Oxygen Therapy: Patient remains intubated per anesthesia plan and Patient placed on Ventilator (see vital sign flow sheet for setting)  Post-op Assessment: Report given to RN and Post -op Vital signs reviewed and stable  Post vital signs: Reviewed and stable  Last Vitals:  Vitals:   10/07/16 0710 10/07/16 0711  BP:    Pulse: (!) 48 (!) 50  Resp: 15 10  Temp:      Last Pain:  Vitals:   10/07/16 0400  TempSrc:   PainSc: Asleep         Complications: No apparent anesthesia complications

## 2016-10-07 NOTE — OR Nursing (Signed)
Forty-five minute call to SICU charge nurse at 1215.

## 2016-10-07 NOTE — Progress Notes (Signed)
Pre Procedure note for inpatients:   Jason [name] has been scheduled for Procedure(s): CORONARY ARTERY BYPASS GRAFTING (CABG) (N/A) TRANSESOPHAGEAL ECHOCARDIOGRAM (TEE) (N/A) today. The various methods of treatment have been discussed with the patient. After consideration of the risks, benefits and treatment options the patient has consented to the planned procedure.   The patient has been seen and labs reviewed. There are no changes in the patient's condition to prevent proceeding with the planned procedure today.  Recent labs:  Lab Results  Component Value Date   WBC 7.2 10/07/2016   HGB 12.6 (L) 10/07/2016   HCT 37.4 (L) 10/07/2016   PLT 139 (L) 10/07/2016   GLUCOSE 95 10/07/2016   CHOL 173 10/02/2016   TRIG 139 10/02/2016   HDL 42 10/02/2016   LDLCALC 103 (H) 10/02/2016   ALT 38 10/06/2016   AST 46 (H) 10/06/2016   NA 138 10/07/2016   K 3.9 10/07/2016   CL 107 10/07/2016   CREATININE 1.19 10/07/2016   BUN 16 10/07/2016   CO2 24 10/07/2016   TSH 6.063 (H) 10/01/2016   INR 0.99 10/01/2016   HGBA1C 5.3 10/01/2016    [name], MD 10/07/2016 7:14 AM

## 2016-10-08 ENCOUNTER — Inpatient Hospital Stay (HOSPITAL_COMMUNITY): Payer: Medicare Other

## 2016-10-08 DIAGNOSIS — Z951 Presence of aortocoronary bypass graft: Secondary | ICD-10-CM

## 2016-10-08 LAB — CREATININE, SERUM
CREATININE: 1.39 mg/dL — AB (ref 0.61–1.24)
Creatinine, Ser: 1.24 mg/dL (ref 0.61–1.24)
GFR, EST AFRICAN AMERICAN: 60 mL/min — AB (ref >60)
GFR, EST AFRICAN AMERICAN: 52 mL/min — AB (ref >60)
GFR, EST NON AFRICAN AMERICAN: 52 mL/min — AB (ref >60)
GFR, EST NON AFRICAN AMERICAN: 45 mL/min — AB (ref >60)

## 2016-10-08 LAB — BASIC METABOLIC PANEL
Anion gap: 9 (ref 5–15)
BUN: 12 mg/dL (ref 6–20)
CHLORIDE: 105 mmol/L (ref 101–111)
CO2: 21 mmol/L — ABNORMAL LOW (ref 22–32)
Calcium: 7.6 mg/dL — ABNORMAL LOW (ref 8.9–10.3)
Creatinine, Ser: 1.16 mg/dL (ref 0.61–1.24)
GFR calc Af Amer: >60 mL/min (ref >60)
GFR, EST NON AFRICAN AMERICAN: 56 mL/min — AB (ref >60)
Glucose, Bld: 118 mg/dL — ABNORMAL HIGH (ref 65–99)
Potassium: 5.3 mmol/L — ABNORMAL HIGH (ref 3.5–5.1)
Sodium: 135 mmol/L (ref 135–145)

## 2016-10-08 LAB — CBC
HCT: 16.3 % — ABNORMAL LOW (ref 39.0–52.0)
HCT: 25.5 % — ABNORMAL LOW (ref 39.0–52.0)
HEMATOCRIT: 30.1 % — AB (ref 39.0–52.0)
HEMATOCRIT: 29 % — AB (ref 39.0–52.0)
HEMOGLOBIN: 9.9 g/dL — AB (ref 13.0–17.0)
HEMOGLOBIN: 9.5 g/dL — AB (ref 13.0–17.0)
HEMOGLOBIN: 5.3 g/dL — AB (ref 13.0–17.0)
HEMOGLOBIN: 8.7 g/dL — AB (ref 13.0–17.0)
MCH: 29.6 pg (ref 26.0–34.0)
MCH: 29.5 pg (ref 26.0–34.0)
MCH: 29.4 pg (ref 26.0–34.0)
MCH: 30.9 pg (ref 26.0–34.0)
MCHC: 32.9 g/dL (ref 30.0–36.0)
MCHC: 32.8 g/dL (ref 30.0–36.0)
MCHC: 32.5 g/dL (ref 30.0–36.0)
MCHC: 34.1 g/dL (ref 30.0–36.0)
MCV: 89.9 fL (ref 78.0–100.0)
MCV: 90.1 fL (ref 78.0–100.0)
MCV: 90.6 fL (ref 78.0–100.0)
MCV: 90.4 fL (ref 78.0–100.0)
PLATELETS: 63 10*3/uL — AB (ref 150–400)
Platelets: 116 10*3/uL — ABNORMAL LOW (ref 150–400)
Platelets: 113 10*3/uL — ABNORMAL LOW (ref 150–400)
Platelets: 85 10*3/uL — ABNORMAL LOW (ref 150–400)
RBC: 3.35 MIL/uL — AB (ref 4.22–5.81)
RBC: 3.22 MIL/uL — ABNORMAL LOW (ref 4.22–5.81)
RBC: 1.8 MIL/uL — AB (ref 4.22–5.81)
RBC: 2.82 MIL/uL — AB (ref 4.22–5.81)
RDW: 14.3 % (ref 11.5–15.5)
RDW: 14.4 % (ref 11.5–15.5)
RDW: 14.7 % (ref 11.5–15.5)
RDW: 14.6 % (ref 11.5–15.5)
WBC: 10.9 10*3/uL — ABNORMAL HIGH (ref 4.0–10.5)
WBC: 13 10*3/uL — AB (ref 4.0–10.5)
WBC: 6.8 10*3/uL (ref 4.0–10.5)
WBC: 10.4 10*3/uL (ref 4.0–10.5)

## 2016-10-08 LAB — GLUCOSE, CAPILLARY
GLUCOSE-CAPILLARY: 121 mg/dL — AB (ref 65–99)
Glucose-Capillary: 101 mg/dL — ABNORMAL HIGH (ref 65–99)
Glucose-Capillary: 105 mg/dL — ABNORMAL HIGH (ref 65–99)
Glucose-Capillary: 125 mg/dL — ABNORMAL HIGH (ref 65–99)
Glucose-Capillary: 79 mg/dL (ref 65–99)
Glucose-Capillary: 97 mg/dL (ref 65–99)
Glucose-Capillary: 99 mg/dL (ref 65–99)

## 2016-10-08 LAB — POCT I-STAT, CHEM 8
BUN: 15 mg/dL (ref 6–20)
CALCIUM ION: 1.07 mmol/L — AB (ref 1.15–1.40)
CREATININE: 1.2 mg/dL (ref 0.61–1.24)
Chloride: 104 mmol/L (ref 101–111)
Glucose, Bld: 106 mg/dL — ABNORMAL HIGH (ref 65–99)
HEMATOCRIT: 22 % — AB (ref 39.0–52.0)
Hemoglobin: 7.5 g/dL — ABNORMAL LOW (ref 13.0–17.0)
Potassium: 4.1 mmol/L (ref 3.5–5.1)
Sodium: 138 mmol/L (ref 135–145)
TCO2: 21 mmol/L (ref 0–100)

## 2016-10-08 LAB — POCT I-STAT 3, ART BLOOD GAS (G3+)
Acid-base deficit: 3 mmol/L — ABNORMAL HIGH (ref 0.0–2.0)
Bicarbonate: 21.3 mmol/L (ref 20.0–28.0)
O2 SAT: 97 %
TCO2: 22 mmol/L (ref 0–100)
pCO2 arterial: 33.2 mmHg (ref 32.0–48.0)
pH, Arterial: 7.417 (ref 7.350–7.450)
pO2, Arterial: 88 mmHg (ref 83.0–108.0)

## 2016-10-08 LAB — MAGNESIUM
MAGNESIUM: 2.4 mg/dL (ref 1.7–2.4)
Magnesium: 2.6 mg/dL — ABNORMAL HIGH (ref 1.7–2.4)

## 2016-10-08 MED ORDER — ENOXAPARIN SODIUM 40 MG/0.4ML ~~LOC~~ SOLN
40.0000 mg | Freq: Every day | SUBCUTANEOUS | Status: DC
  Administered 2016-10-08 – 2016-10-12 (×5): 40 mg via SUBCUTANEOUS
  Filled 2016-10-08 (×5): qty 0.4

## 2016-10-08 MED ORDER — ALBUMIN HUMAN 5 % IV SOLN
12.5000 g | Freq: Once | INTRAVENOUS | Status: AC
  Administered 2016-10-08: 12.5 g via INTRAVENOUS
  Filled 2016-10-08: qty 250

## 2016-10-08 MED ORDER — FUROSEMIDE 10 MG/ML IJ SOLN
40.0000 mg | Freq: Once | INTRAMUSCULAR | Status: AC
  Administered 2016-10-08: 40 mg via INTRAVENOUS
  Filled 2016-10-08: qty 4

## 2016-10-08 MED ORDER — INSULIN ASPART 100 UNIT/ML ~~LOC~~ SOLN
0.0000 [IU] | Freq: Three times a day (TID) | SUBCUTANEOUS | Status: DC
  Administered 2016-10-09 – 2016-10-11 (×3): 2 [IU] via SUBCUTANEOUS

## 2016-10-08 NOTE — Progress Notes (Signed)
1 Day Post-Op Procedure(s) (LRB): CORONARY ARTERY BYPASS GRAFTING (CABG) x 3 , using internal mammary, and right greater saphenous vein harvested endoscopically SVG-PD, SVG-OM, LIMA-LAD (N/A) TRANSESOPHAGEAL ECHOCARDIOGRAM (TEE) (N/A) Subjective: Some pain "not too bad"  Objective: Vital signs in last 24 hours: Temp:  [96.1 F (35.6 C)-100 F (37.8 C)] 99.7 F (37.6 C) (04/14 0800) Pulse Rate:  [70-86] 86 (04/14 0800) Cardiac Rhythm: Atrial paced (04/14 0800) Resp:  [12-29] 20 (04/14 0800) BP: (87-107)/(44-83) 98/50 (04/14 0800) SpO2:  [96 %-100 %] 98 % (04/14 0800) Arterial Line BP: (100-123)/(39-51) 106/40 (04/14 0800) FiO2 (%):  [40 %-50 %] 40 % (04/13 2120) Weight:  [152 lb 1.9 oz (69 kg)-158 lb 15.2 oz (72.1 kg)] 158 lb 15.2 oz (72.1 kg) (04/14 0500)  Hemodynamic parameters for last 24 hours: PAP: (21-32)/(4-16) 32/16 CO:  [4.1 L/min-4.9 L/min] 4.8 L/min CI:  [2.4 L/min/m2-2.8 L/min/m2] 2.8 L/min/m2  Intake/Output from previous day: 04/13 0701 - 04/14 0700 In: 6109.3 [P.O.:90; I.V.:4121.3; Blood:433; IV Piggyback:1465] Out: 3320 [Urine:2180; Blood:800; Chest Tube:340] Intake/Output this shift: Total I/O In: 305.4 [I.V.:55.4; IV Piggyback:250] Out: 40 [Urine:30; Chest Tube:10]  General appearance: alert, cooperative and no distress Neurologic: intact Heart: regular rate and rhythm Lungs: diminished breath sounds bibasilar  Lab Results:  Recent Labs  10/07/16 2000 10/07/16 2022 10/08/16 0410  WBC 6.8  --  10.9*  HGB 9.5* 8.2* 9.9*  HCT 28.5* 24.0* 30.1*  PLT 83*  --  116*   BMET:  Recent Labs  10/07/16 0440  10/07/16 2022 10/08/16 0410  NA 138  < > 134* 135  K 3.9  < > 4.4 5.3*  CL 107  < > 105 105  CO2 24  --   --  21*  GLUCOSE 95  < > 162* 118*  BUN 16  < > 10 12  CREATININE 1.19  < > 0.90 1.16  CALCIUM 8.1*  --   --  7.6*  < > = values in this interval not displayed.  PT/INR:  Recent Labs  10/07/16 1343  LABPROT 18.8*  INR 1.55   ABG     Component Value Date/Time   PHART 7.417 10/08/2016 0021   HCO3 21.3 10/08/2016 0021   TCO2 22 10/08/2016 0021   ACIDBASEDEF 3.0 (H) 10/08/2016 0021   O2SAT 97.0 10/08/2016 0021   CBG (last 3)   Recent Labs  10/08/16 0009 10/08/16 0402 10/08/16 0802  GLUCAP 105* 79 97    Assessment/Plan: S/P Procedure(s) (LRB): CORONARY ARTERY BYPASS GRAFTING (CABG) x 3 , using internal mammary, and right greater saphenous vein harvested endoscopically SVG-PD, SVG-OM, LIMA-LAD (N/A) TRANSESOPHAGEAL ECHOCARDIOGRAM (TEE) (N/A) -  CV- POD # 1 CABG. Good hemodynamics- dc swan and arterial line  ASA, beta blocker, statin  RESP_ IS for atelectasis  RENAL_ creatinine OK. Mild hyperkalemia  ENDO_ CBG normal  Gi advance diet  SCD + enoxaparin for DVT prophylaxis  Dc chest tubes  Mobilize      LOS: 7 days    Loreli Slot 10/08/2016

## 2016-10-08 NOTE — Progress Notes (Signed)
Progress Note  Patient Name: Jason [name] Date of Encounter: 10/08/2016  Primary Cardiologist: Dr. Katrinka Blazing postop day 3   Subjective   Feeling well. No chest pain, sob or palpitations. Is 1 day post op from CABG Doing great   Inpatient Medications    Scheduled Meds: . acetaminophen  1,000 mg Oral Q6H   Or  . acetaminophen (TYLENOL) oral liquid 160 mg/5 mL  1,000 mg Per Tube Q6H  . aspirin EC  325 mg Oral Daily   Or  . aspirin  324 mg Per Tube Daily  . atorvastatin  80 mg Oral q1800  . bisacodyl  10 mg Oral Daily   Or  . bisacodyl  10 mg Rectal Daily  . cefUROXime (ZINACEF)  IV  1.5 g Intravenous Q12H  . docusate sodium  200 mg Oral Daily  . famotidine (PEPCID) IV  20 mg Intravenous Q12H  . insulin aspart  0-24 Units Subcutaneous Q4H  . mouth rinse  15 mL Mouth Rinse BID  . metoprolol tartrate  12.5 mg Oral BID   Or  . metoprolol tartrate  12.5 mg Per Tube BID  . [START ON 10/09/2016] pantoprazole  40 mg Oral Daily  . sodium chloride flush  3 mL Intravenous Q12H   Continuous Infusions: . sodium chloride 20 mL/hr at 10/08/16 0400  . sodium chloride    . sodium chloride 20 mL/hr at 10/07/16 1900  . dexmedetomidine (PRECEDEX) IV infusion Stopped (10/07/16 1600)  . lactated ringers    . lactated ringers 20 mL/hr at 10/08/16 0400  . nitroGLYCERIN Stopped (10/07/16 1340)  . phenylephrine (NEO-SYNEPHRINE) Adult infusion 25 mcg/min (10/08/16 0753)   PRN Meds: sodium chloride, albumin human, fentaNYL (SUBLIMAZE) injection, lactated ringers, metoprolol, midazolam, ondansetron (ZOFRAN) IV, sodium chloride flush, traMADol   Vital Signs    Vitals:   10/08/16 0500 10/08/16 0600 10/08/16 0700 10/08/16 0800  BP: 95/83 (!) 102/56 (!) 96/44 (!) 98/50  Pulse: 86 86 86 86  Resp: (!) 21 (!) 22 (!) 22 20  Temp: 100 F (37.8 C) 100 F (37.8 C) 100 F (37.8 C) 99.7 F (37.6 C)  TempSrc:    Core (Comment)  SpO2: 99% 98% 98% 98%  Weight: 158 lb 15.2 oz (72.1 kg)     Height:         Intake/Output Summary (Last 24 hours) at 10/08/16 0849 Last data filed at 10/08/16 0800  Gross per 24 hour  Intake          6414.78 ml  Output             3285 ml  Net          3129.78 ml   Filed Weights   10/01/16 1730 10/07/16 1900 10/08/16 0500  Weight: 152 lb 1.9 oz (69 kg) 152 lb 1.9 oz (69 kg) 158 lb 15.2 oz (72.1 kg)    Telemetry    Sinus rhythm   - Personally Reviewed  ECG    None today   Physical Exam   GEN: No acute distress.   Neck: No JVD Cardiac: RRR, no murmurs, rubs, or gallops. s/p sternotomy  Respiratory: Clear to auscultation bilaterally. GI: Soft, nontender, non-distended  MS: No edema; No deformity. Neuro:  Nonfocal  Psych: Normal affect   Labs    Chemistry Recent Labs Lab 10/01/16 1626  10/03/16 0159 10/06/16 0334 10/07/16 0440  10/07/16 1207 10/07/16 1347 10/07/16 2000 10/07/16 2022 10/08/16 0410  NA 139  < > 138 138 138  < >  137 140  --  134* 135  K 4.6  < > 3.7 4.0 3.9  < > 4.0 3.8  --  4.4 5.3*  CL 102  < > 107 107 107  < > 103  --   --  105 105  CO2 28  < > --   --   --   --   --  21*  GLUCOSE 100*  < > 99 98 95  < > 134* 101*  --  162* 118*  BUN 16  < > < > 13  --   --  10 12  CREATININE 1.30*  < > 1.13 1.18 1.19  < > 0.80  --  0.98 0.90 1.16  CALCIUM 9.3  < > 8.5* 8.3* 8.1*  --   --   --   --   --  7.6*  PROT 6.8  --  5.8* 5.7*  --   --   --   --   --   --   --   ALBUMIN 3.7  --  3.2* 3.1*  --   --   --   --   --   --   --   AST 30  --  73* 46*  --   --   --   --   --   --   --   ALT 19  --  25 38  --   --   --   --   --   --   --   ALKPHOS 67  --  59 60  --   --   --   --   --   --   --   BILITOT 1.3*  --  1.8* 1.5*  --   --   --   --   --   --   --   GFRNONAA 49*  < > 58* 55* 54*  --   --   --  >60  --  56*  GFRAA 56*  < > >60 >60 >60  --   --   --  >60  --  >60  ANIONGAP 9  < > --   --   --   --   --  9  < > = values in this interval not displayed.   Hematology Recent Labs Lab  10/07/16 1343  10/07/16 2000 10/07/16 2022 10/08/16 0410  WBC 8.4  --  6.8  --  10.9*  RBC 3.04*  --  3.19*  --  3.35*  HGB 9.2*  < > 9.5* 8.2* 9.9*  HCT 27.3*  < > 28.5* 24.0* 30.1*  MCV 89.8  --  89.3  --  89.9  MCH 30.3  --  29.8  --  29.6  MCHC 33.7  --  33.3  --  32.9  RDW 14.1  --  14.0  --  14.3  PLT 73*  --  83*  --  116*  < > = values in this interval not displayed.  Cardiac Enzymes  Recent Labs Lab 10/01/16 1626 10/01/16 1950 10/02/16 0455  TROPONINI 0.42* 28.46* 47.50*     Recent Labs Lab 10/01/16 1635  TROPIPOC 0.19*     BNP  Recent Labs Lab 10/01/16 1627 10/01/16 1950 10/03/16 0159  BNP 59.7 82.7 269.4*     DDimer No results for input(s): DDIMER in the last 168 hours.   Radiology  Dg Chest Port 1 View  Result Date: 10/08/2016 CLINICAL DATA:  Postop day 1 CABG. EXAM: PORTABLE CHEST 1 VIEW COMPARISON:  One-view chest x-ray 10/07/2016 FINDINGS: The patient has been extubated. Left-sided chest tube and mediastinal drain remain. The tip of the Swan-Ganz catheter is in the proximal right pulmonary artery. There is no pneumothorax. A left pleural effusion remains. Left basilar airspace disease is decreased, likely reflecting atelectasis. Small right pleural effusion is noted. IMPRESSION: 1. Interval extubation. 2. Decreasing left pleural effusion and associated atelectasis. 3. Small right pleural effusion. Electronically Signed   By: Marin Roberts M.D.   On: 10/08/2016 07:37   Dg Chest Port 1 View  Result Date: 10/07/2016 CLINICAL DATA:  83 year old male with a history of previous CABG EXAM: PORTABLE CHEST 1 VIEW COMPARISON:  10/01/2016, 08/13/2014 FINDINGS: Interval surgical changes of median sternotomy and CABG. Interval placement of endotracheal tube, which terminates suitably above the carina, approximately 2.7 cm. Interval placement of right IJ sheath through which a Swan-Ganz catheter terminates in the right pulmonary artery. Interval  placement of gastric tube, terminating below the diaphragm out of the field of view. Interval placement of mediastinal drains and left thoracostomy tube. Low lung volumes. Opacity at the left lung base partially obscuring the left hemidiaphragm and left heart border. No pneumothorax. IMPRESSION: Early postop changes of median sternotomy and CABG, with opacity at the left lung base, likely a combination of atelectasis and small pleural fluid. No visualized pneumothorax. Surgical apparatus includes endotracheal tube, gastric tube, right IJ sheath/Swan-Ganz, mediastinal/ pleural drains, as above. Electronically Signed   By: Gilmer Mor D.O.   On: 10/07/2016 14:02    Cardiac Studies   Cath-  Conclusion    Acute coronary syndrome initially presenting with anterior ST elevation, spontaneously resolving by arrival in the emergency room.  Greater than 50% ostial left main associated with catheter damping and bradycardia with engagement and contrast injection.  90+ percent mid LAD, thrombus filled culprit lesion.  90% ostial large branching obtuse marginal. Her complex is dominant giving the PDA.  Normal, widely patent nondominant RCA.  Left ventricular dysfunction with mid anterior wall to the apical akinesis, EF 35-45%. The regional wall motion abnormality is due to stunned muscle from transient LAD occlusion.  RECOMMENDATIONS:   TCTS consultation (spoke to Dr. Tyrone Sage) for consideration of elective bypass surgery next week.  Resume IV heparin and continue IV nitroglycerin.  A loading dose of Brilinta was used before the decision to consider surgery was made.  Continue low-dose beta blocker therapy.    2D Echo-   Study Conclusions  - Left ventricle: The cavity size was normal. There was mild concentric hypertrophy. Systolic function was mildly reduced. The estimated ejection fraction was in the range of 45% to 50%. Wall motion was normal; there were no regional wall  motion abnormalities. Doppler parameters are consistent with abnormal left ventricular relaxation (grade 1 diastolic dysfunction). There was no evidence of elevated ventricular filling pressure by Doppler parameters. - Aortic valve: Trileaflet; mildly thickened, mildly calcified leaflets. There was moderate regurgitation. - Aortic root: The aortic root was normal in size. - Mitral valve: Structurally normal valve. There was trivial regurgitation. - Right ventricle: The cavity size was normal. Wall thickness was normal. Systolic function was normal. - Tricuspid valve: There was mild regurgitation. - Pulmonary arteries: Systolic pressure was mildly increased. PA peak pressure: 31 mm Hg (S). - Inferior vena cava: The vessel was normal in size. - Pericardium, extracardiac: There was no pericardial effusion.  Impressions:  - There is hypokinesis of all of the apical segments. Overall LVEF is mildly decreased at 45-50%.   Patient Profile     83 y.o. male with no prior cardiac history admitted with aborted anterior myocardial infarction.  Assessment & Plan    1. CAD  s/p CABG.  Overall doing well    2. ICM - EF of 35% by Cardiac catheterization. Echo showed EF of 45-50% with apical hypokinesis. On low dose BB. ACE inhibitor will be added prior to discharge from the hospital.   should improve after revascularization      Kristeen Miss, MD  10/08/2016 8:55 AM    [facility] [address] [place],  [address] [place], [place]  [address] Pager [phone] Phone: [phone]; Fax: [phone]

## 2016-10-08 NOTE — Progress Notes (Signed)
      [address]       [place] [address]             [phone]      Resting comfortably  BP (!) 102/50   Pulse 86   Temp 98.1 F (36.7 C) (Oral)   Resp (!) 32   Ht  (1.626 m)   Wt 158 lb 15.2 oz (72.1 kg)   SpO2 97%   BMI 27.28 kg/m    Intake/Output Summary (Last 24 hours) at 10/08/16 1737 Last data filed at 10/08/16 1100  Gross per 24 hour  Intake          1493.84 ml  Output             1095 ml  Net           398.84 ml   Ambulated earlier  PM labs pending  Viviann Spare C. Dorris Fetch, MD [facility] [phone]

## 2016-10-09 ENCOUNTER — Inpatient Hospital Stay (HOSPITAL_COMMUNITY): Payer: Medicare Other

## 2016-10-09 LAB — CBC
HCT: 24.3 % — ABNORMAL LOW (ref 39.0–52.0)
Hemoglobin: 8.2 g/dL — ABNORMAL LOW (ref 13.0–17.0)
MCH: 30.4 pg (ref 26.0–34.0)
MCHC: 33.7 g/dL (ref 30.0–36.0)
MCV: 90 fL (ref 78.0–100.0)
PLATELETS: 89 10*3/uL — AB (ref 150–400)
RBC: 2.7 MIL/uL — AB (ref 4.22–5.81)
RDW: 14.5 % (ref 11.5–15.5)
WBC: 11.5 10*3/uL — ABNORMAL HIGH (ref 4.0–10.5)

## 2016-10-09 LAB — BASIC METABOLIC PANEL
ANION GAP: 8 (ref 5–15)
Anion gap: 7 (ref 5–15)
BUN: 21 mg/dL — ABNORMAL HIGH (ref 6–20)
BUN: 29 mg/dL — AB (ref 6–20)
CALCIUM: 7.8 mg/dL — AB (ref 8.9–10.3)
CHLORIDE: 98 mmol/L — AB (ref 101–111)
CO2: 24 mmol/L (ref 22–32)
CO2: 23 mmol/L (ref 22–32)
CREATININE: 1.63 mg/dL — AB (ref 0.61–1.24)
Calcium: 7.5 mg/dL — ABNORMAL LOW (ref 8.9–10.3)
Chloride: 102 mmol/L (ref 101–111)
Creatinine, Ser: 1.74 mg/dL — ABNORMAL HIGH (ref 0.61–1.24)
GFR calc Af Amer: 40 mL/min — ABNORMAL LOW (ref >60)
GFR calc non Af Amer: 34 mL/min — ABNORMAL LOW (ref >60)
GFR, EST AFRICAN AMERICAN: 43 mL/min — AB (ref >60)
GFR, EST NON AFRICAN AMERICAN: 37 mL/min — AB (ref >60)
GLUCOSE: 143 mg/dL — AB (ref 65–99)
Glucose, Bld: 117 mg/dL — ABNORMAL HIGH (ref 65–99)
POTASSIUM: 3.9 mmol/L (ref 3.5–5.1)
Potassium: 4.3 mmol/L (ref 3.5–5.1)
SODIUM: 133 mmol/L — AB (ref 135–145)
Sodium: 129 mmol/L — ABNORMAL LOW (ref 135–145)

## 2016-10-09 LAB — GLUCOSE, CAPILLARY
GLUCOSE-CAPILLARY: 124 mg/dL — AB (ref 65–99)
GLUCOSE-CAPILLARY: 115 mg/dL — AB (ref 65–99)
Glucose-Capillary: 125 mg/dL — ABNORMAL HIGH (ref 65–99)
Glucose-Capillary: 114 mg/dL — ABNORMAL HIGH (ref 65–99)

## 2016-10-09 MED ORDER — METOCLOPRAMIDE HCL 5 MG/ML IJ SOLN
5.0000 mg | Freq: Four times a day (QID) | INTRAMUSCULAR | Status: AC
  Administered 2016-10-09 – 2016-10-10 (×4): 5 mg via INTRAVENOUS
  Filled 2016-10-09 (×4): qty 2

## 2016-10-09 MED ORDER — SODIUM CHLORIDE 0.9 % IV BOLUS (SEPSIS)
500.0000 mL | Freq: Once | INTRAVENOUS | Status: AC
  Administered 2016-10-09: 500 mL via INTRAVENOUS

## 2016-10-09 NOTE — Progress Notes (Signed)
2 Days Post-Op Procedure(s) (LRB): CORONARY ARTERY BYPASS GRAFTING (CABG) x 3 , using internal mammary, and right greater saphenous vein harvested endoscopically SVG-PD, SVG-OM, LIMA-LAD (N/A) TRANSESOPHAGEAL ECHOCARDIOGRAM (TEE) (N/A) Subjective: Feels poorly this morning General malaise and some nausea  Objective: Vital signs in last 24 hours: Temp:  [98.1 F (36.7 C)-98.7 F (37.1 C)] 98.1 F (36.7 C) (04/15 0700) Pulse Rate:  [81-88] 85 (04/15 0645) Cardiac Rhythm: Atrial paced (04/15 0400) Resp:  [7-32] 28 (04/15 0645) BP: (86-118)/(39-66) 92/51 (04/15 0645) SpO2:  [91 %-100 %] 98 % (04/15 0645) Arterial Line BP: (59-117)/(33-51) 106/40 (04/14 1600) FiO2 (%):  [30 %] 30 % (04/15 0430) Weight:  [165 lb 12.6 oz (75.2 kg)] 165 lb 12.6 oz (75.2 kg) (04/15 0500)  Hemodynamic parameters for last 24 hours:    Intake/Output from previous day: 04/14 0701 - 04/15 0700 In: 2898.1 [P.O.:1660; I.V.:638.1; IV Piggyback:600] Out: 1432 [Urine:1372; Chest Tube:60] Intake/Output this shift: No intake/output data recorded.  General appearance: alert, cooperative and no distress Neurologic: intact Heart: regular rate and rhythm Lungs: diminished breath sounds bibasilar Abdomen: distended, tympanitic, hypoactive BS  Lab Results:  Recent Labs  10/08/16 1827 10/09/16 0405  WBC 10.4 11.5*  HGB 8.7* 8.2*  HCT 25.5* 24.3*  PLT 85* 89*   BMET:  Recent Labs  10/08/16 0410  10/08/16 1740 10/09/16 0405  NA 135  --  138 133*  K 5.3*  --  4.1 4.3  CL 105  --  104 102  CO2 21*  --   --  24  GLUCOSE 118*  --  106* 117*  BUN 12  --  15 21*  CREATININE 1.16  < > 1.20 1.63*  CALCIUM 7.6*  --   --  7.8*  < > = values in this interval not displayed.  PT/INR:  Recent Labs  10/07/16 1343  LABPROT 18.8*  INR 1.55   ABG    Component Value Date/Time   PHART 7.417 10/08/2016 0021   HCO3 21.3 10/08/2016 0021   TCO2 21 10/08/2016 1740   ACIDBASEDEF 3.0 (H) 10/08/2016 0021   O2SAT  97.0 10/08/2016 0021   CBG (last 3)   Recent Labs  10/08/16 2310 10/09/16 0421 10/09/16 0736  GLUCAP 125* 124* 125*    Assessment/Plan: S/P Procedure(s) (LRB): CORONARY ARTERY BYPASS GRAFTING (CABG) x 3 , using internal mammary, and right greater saphenous vein harvested endoscopically SVG-PD, SVG-OM, LIMA-LAD (N/A) TRANSESOPHAGEAL ECHOCARDIOGRAM (TEE) (N/A) -CV- BP still on low side with systolic in mid 90s- will give a normal saline bolus  Hold beta blocker until BP improves  RESP- continue IS for atelectasis  RENAL- creatinine increased from 1.2 to 1.63 overnight. Diuresed well with lasix but probably over diuresed- will give 500 ml of saline this AM. Keep Foley to monitor UO  ENDO_ CBG well controlled  GI- gastric dilatation noted on CXR this AM and hypoactive BS c/w ileus- will give Reglan today  Continue to mobilize as tolerated   LOS: 8 days    Loreli Slot 10/09/2016

## 2016-10-09 NOTE — Progress Notes (Signed)
Progress Note  Patient Name: Jason [name] Date of Encounter: 10/09/2016  Primary Cardiologist: Dr. Katrinka Blazing postop day 3   Subjective   Feeling well. No chest pain, sob or palpitations. Is 1 day post op from CABG Doing great   Inpatient Medications    Scheduled Meds: . acetaminophen  1,000 mg Oral Q6H   Or  . acetaminophen (TYLENOL) oral liquid 160 mg/5 mL  1,000 mg Per Tube Q6H  . aspirin EC  325 mg Oral Daily   Or  . aspirin  324 mg Per Tube Daily  . atorvastatin  80 mg Oral q1800  . bisacodyl  10 mg Oral Daily   Or  . bisacodyl  10 mg Rectal Daily  . docusate sodium  200 mg Oral Daily  . enoxaparin (LOVENOX) injection  40 mg Subcutaneous QHS  . insulin aspart  0-15 Units Subcutaneous TID WC  . mouth rinse  15 mL Mouth Rinse BID  . metoprolol tartrate  12.5 mg Oral BID   Or  . metoprolol tartrate  12.5 mg Per Tube BID  . pantoprazole  40 mg Oral Daily  . sodium chloride flush  3 mL Intravenous Q12H   Continuous Infusions: . sodium chloride Stopped (10/08/16 0900)  . sodium chloride    . sodium chloride 10 mL/hr at 10/09/16 0614  . dexmedetomidine (PRECEDEX) IV infusion Stopped (10/07/16 1600)  . lactated ringers    . lactated ringers Stopped (10/09/16 0530)  . nitroGLYCERIN Stopped (10/07/16 1340)  . phenylephrine (NEO-SYNEPHRINE) Adult infusion Stopped (10/08/16 2216)   PRN Meds: sodium chloride, fentaNYL (SUBLIMAZE) injection, lactated ringers, metoprolol, midazolam, ondansetron (ZOFRAN) IV, sodium chloride flush, traMADol   Vital Signs    Vitals:   10/09/16 0615 10/09/16 0630 10/09/16 0645 10/09/16 0700  BP: (!) 118/54 (!) 107/53 (!) 92/51   Pulse: 85 85 85   Resp: (!) 26 (!) 23 (!) 28   Temp:    98.1 F (36.7 C)  TempSrc:    Oral  SpO2: 98% 97% 98%   Weight:      Height:        Intake/Output Summary (Last 24 hours) at 10/09/16 0853 Last data filed at 10/09/16 0629  Gross per 24 hour  Intake          2592.66 ml  Output             1392 ml    Net          1200.66 ml   Filed Weights   10/07/16 1900 10/08/16 0500 10/09/16 0500  Weight: 152 lb 1.9 oz (69 kg) 158 lb 15.2 oz (72.1 kg) 165 lb 12.6 oz (75.2 kg)    Telemetry    Sinus rhythm   - Personally Reviewed  ECG    None today   Physical Exam   GEN: No acute distress.   Neck: No JVD Cardiac: RRR, no murmurs, rubs, or gallops. s/p sternotomy  Respiratory: Clear to auscultation bilaterally. GI: Soft, nontender, non-distended  MS: No edema; No deformity. Neuro:  Nonfocal  Psych: Normal affect   Labs    Chemistry Recent Labs Lab 10/03/16 0159 10/06/16 0334 10/07/16 0440  10/08/16 0410 10/08/16 0935 10/08/16 1715 10/08/16 1740 10/09/16 0405  NA 138 138 138  < > 135  --   --  138 133*  K 3.7 4.0 3.9  < > 5.3*  --   --  4.1 4.3  CL 107 107 107  < > 105  --   --  104 102  CO2 --  21*  --   --   --  24  GLUCOSE 99 98 95  < > 118*  --   --  106* 117*  BUN < > 12  --   --  15 21*  CREATININE 1.13 1.18 1.19  < > 1.16 1.24 1.39* 1.20 1.63*  CALCIUM 8.5* 8.3* 8.1*  --  7.6*  --   --   --  7.8*  PROT 5.8* 5.7*  --   --   --   --   --   --   --   ALBUMIN 3.2* 3.1*  --   --   --   --   --   --   --   AST 73* 46*  --   --   --   --   --   --   --   ALT 25 38  --   --   --   --   --   --   --   ALKPHOS 59 60  --   --   --   --   --   --   --   BILITOT 1.8* 1.5*  --   --   --   --   --   --   --   GFRNONAA 58* 55* 54*  < > 56* 52* 45*  --  37*  GFRAA >60 >60 >60  < > >60 60* 52*  --  43*  ANIONGAP --  9  --   --   --  7  < > = values in this interval not displayed.   Hematology  Recent Labs Lab 10/08/16 1715 10/08/16 1740 10/08/16 1827 10/09/16 0405  WBC 6.8  --  10.4 11.5*  RBC 1.80*  --  2.82* 2.70*  HGB 5.3* 7.5* 8.7* 8.2*  HCT 16.3* 22.0* 25.5* 24.3*  MCV 90.6  --  90.4 90.0  MCH 29.4  --  30.9 30.4  MCHC 32.5  --  34.1 33.7  RDW 14.7  --  14.6 14.5  PLT 63*  --  85* 89*    Cardiac Enzymes No results for input(s):  TROPONINI in the last 168 hours.  No results for input(s): TROPIPOC in the last 168 hours.   BNP  Recent Labs Lab 10/03/16 0159  BNP 269.4*     DDimer No results for input(s): DDIMER in the last 168 hours.   Radiology    Dg Chest Port 1 View  Result Date: 10/09/2016 CLINICAL DATA:  CABG, postop day 2. EXAM: PORTABLE CHEST 1 VIEW COMPARISON:  10/08/2016 and prior exams FINDINGS: Cardiomegaly, CABG changes and right IJ central venous catheter sheath again noted. There has been interval removal of a Swan-Ganz catheter and mediastinal and left thoracostomy tubes. Bibasilar atelectasis again noted. There is no evidence of pulmonary edema or pneumothorax. IMPRESSION: Support apparatus removal otherwise unchanged appearance of the chest. No evidence of pneumothorax. Electronically Signed   By: Harmon Pier M.D.   On: 10/09/2016 07:31   Dg Chest Port 1 View  Result Date: 10/08/2016 CLINICAL DATA:  Postop day 1 CABG. EXAM: PORTABLE CHEST 1 VIEW COMPARISON:  One-view chest x-ray 10/07/2016 FINDINGS: The patient has been extubated. Left-sided chest tube and mediastinal drain remain. The tip of the Swan-Ganz catheter is in the proximal right pulmonary artery. There is no pneumothorax. A left pleural effusion remains. Left  basilar airspace disease is decreased, likely reflecting atelectasis. Small right pleural effusion is noted. IMPRESSION: 1. Interval extubation. 2. Decreasing left pleural effusion and associated atelectasis. 3. Small right pleural effusion. Electronically Signed   By: Marin Roberts M.D.   On: 10/08/2016 07:37   Dg Chest Port 1 View  Result Date: 10/07/2016 CLINICAL DATA:  83 year old male with a history of previous CABG EXAM: PORTABLE CHEST 1 VIEW COMPARISON:  10/01/2016, 08/13/2014 FINDINGS: Interval surgical changes of median sternotomy and CABG. Interval placement of endotracheal tube, which terminates suitably above the carina, approximately 2.7 cm. Interval placement of  right IJ sheath through which a Swan-Ganz catheter terminates in the right pulmonary artery. Interval placement of gastric tube, terminating below the diaphragm out of the field of view. Interval placement of mediastinal drains and left thoracostomy tube. Low lung volumes. Opacity at the left lung base partially obscuring the left hemidiaphragm and left heart border. No pneumothorax. IMPRESSION: Early postop changes of median sternotomy and CABG, with opacity at the left lung base, likely a combination of atelectasis and small pleural fluid. No visualized pneumothorax. Surgical apparatus includes endotracheal tube, gastric tube, right IJ sheath/Swan-Ganz, mediastinal/ pleural drains, as above. Electronically Signed   By: Gilmer Mor D.O.   On: 10/07/2016 14:02    Cardiac Studies   Cath-  Conclusion    Acute coronary syndrome initially presenting with anterior ST elevation, spontaneously resolving by arrival in the emergency room.  Greater than 50% ostial left main associated with catheter damping and bradycardia with engagement and contrast injection.  90+ percent mid LAD, thrombus filled culprit lesion.  90% ostial large branching obtuse marginal. Her complex is dominant giving the PDA.  Normal, widely patent nondominant RCA.  Left ventricular dysfunction with mid anterior wall to the apical akinesis, EF 35-45%. The regional wall motion abnormality is due to stunned muscle from transient LAD occlusion.  RECOMMENDATIONS:   TCTS consultation (spoke to Dr. Tyrone Sage) for consideration of elective bypass surgery next week.  Resume IV heparin and continue IV nitroglycerin.  A loading dose of Brilinta was used before the decision to consider surgery was made.  Continue low-dose beta blocker therapy.    2D Echo-   Study Conclusions  - Left ventricle: The cavity size was normal. There was mild concentric hypertrophy. Systolic function was mildly reduced. The estimated  ejection fraction was in the range of 45% to 50%. Wall motion was normal; there were no regional wall motion abnormalities. Doppler parameters are consistent with abnormal left ventricular relaxation (grade 1 diastolic dysfunction). There was no evidence of elevated ventricular filling pressure by Doppler parameters. - Aortic valve: Trileaflet; mildly thickened, mildly calcified leaflets. There was moderate regurgitation. - Aortic root: The aortic root was normal in size. - Mitral valve: Structurally normal valve. There was trivial regurgitation. - Right ventricle: The cavity size was normal. Wall thickness was normal. Systolic function was normal. - Tricuspid valve: There was mild regurgitation. - Pulmonary arteries: Systolic pressure was mildly increased. PA peak pressure: 31 mm Hg (S). - Inferior vena cava: The vessel was normal in size. - Pericardium, extracardiac: There was no pericardial effusion.  Impressions:  - There is hypokinesis of all of the apical segments. Overall LVEF is mildly decreased at 45-50%.   Patient Profile     83 y.o. male with no prior cardiac history admitted with aborted anterior myocardial infarction.  Assessment & Plan    1. CAD  s/p CABG.  Overall doing well    2. ICM -  EF of 35% by Cardiac catheterization. Echo showed EF of 45-50% with apical hypokinesis.  BP has been low.   Metoprolol has been held. Will not be able to start ARB yet.  3. Hypotension:  BP is low . Slightly anemic.    Encouraged PO intake .      Kristeen Miss, MD  10/09/2016 8:53 AM    [facility] [address] [place],  [address] [place], [place]  [address] Pager [phone] Phone: [phone]; Fax: [phone]

## 2016-10-09 NOTE — Progress Notes (Signed)
      [address]       [place] [address]             [phone]      Tired after walk  BP (!) 92/51   Pulse 85   Temp 97.6 F (36.4 C) (Oral)   Resp (!) 28   Ht  (1.626 m)   Wt 165 lb 12.6 oz (75.2 kg)   SpO2 98%   BMI 28.46 kg/m    Intake/Output Summary (Last 24 hours) at 10/09/16 1656 Last data filed at 10/09/16 0629  Gross per 24 hour  Intake          1696.26 ml  Output             1127 ml  Net           569.26 ml   PM labs pending  Viviann Spare C. Dorris Fetch, MD [facility] [phone]

## 2016-10-10 ENCOUNTER — Encounter (HOSPITAL_COMMUNITY): Payer: Self-pay | Admitting: Cardiothoracic Surgery

## 2016-10-10 ENCOUNTER — Inpatient Hospital Stay (HOSPITAL_COMMUNITY): Payer: Medicare Other

## 2016-10-10 LAB — BPAM RBC
Blood Product Expiration Date: 201805042359
Blood Product Expiration Date: 201805042359
Blood Product Expiration Date: 201805042359
Blood Product Expiration Date: 201805042359
ISSUE DATE / TIME: 201804131022
ISSUE DATE / TIME: 201804131022
Unit Type and Rh: 6200
Unit Type and Rh: 6200
Unit Type and Rh: 6200
Unit Type and Rh: 6200

## 2016-10-10 LAB — CBC
HCT: 25.1 % — ABNORMAL LOW (ref 39.0–52.0)
HEMOGLOBIN: 8.4 g/dL — AB (ref 13.0–17.0)
MCH: 30.1 pg (ref 26.0–34.0)
MCHC: 33.5 g/dL (ref 30.0–36.0)
MCV: 90 fL (ref 78.0–100.0)
PLATELETS: 111 10*3/uL — AB (ref 150–400)
RBC: 2.79 MIL/uL — AB (ref 4.22–5.81)
RDW: 14.6 % (ref 11.5–15.5)
WBC: 12.5 10*3/uL — ABNORMAL HIGH (ref 4.0–10.5)

## 2016-10-10 LAB — BASIC METABOLIC PANEL
Anion gap: 8 (ref 5–15)
BUN: 31 mg/dL — AB (ref 6–20)
CHLORIDE: 99 mmol/L — AB (ref 101–111)
CO2: 25 mmol/L (ref 22–32)
Calcium: 7.7 mg/dL — ABNORMAL LOW (ref 8.9–10.3)
Creatinine, Ser: 1.5 mg/dL — ABNORMAL HIGH (ref 0.61–1.24)
GFR calc Af Amer: 48 mL/min — ABNORMAL LOW (ref >60)
GFR calc non Af Amer: 41 mL/min — ABNORMAL LOW (ref >60)
GLUCOSE: 112 mg/dL — AB (ref 65–99)
POTASSIUM: 4.1 mmol/L (ref 3.5–5.1)
Sodium: 132 mmol/L — ABNORMAL LOW (ref 135–145)

## 2016-10-10 LAB — GLUCOSE, CAPILLARY
GLUCOSE-CAPILLARY: 124 mg/dL — AB (ref 65–99)
GLUCOSE-CAPILLARY: 112 mg/dL — AB (ref 65–99)
Glucose-Capillary: 124 mg/dL — ABNORMAL HIGH (ref 65–99)
Glucose-Capillary: 124 mg/dL — ABNORMAL HIGH (ref 65–99)
Glucose-Capillary: 124 mg/dL — ABNORMAL HIGH (ref 65–99)

## 2016-10-10 LAB — TYPE AND SCREEN
ABO/RH(D): A POS
Antibody Screen: NEGATIVE
Unit division: 0
Unit division: 0
Unit division: 0
Unit division: 0

## 2016-10-10 MED ORDER — POLYETHYLENE GLYCOL 3350 17 G PO PACK
17.0000 g | PACK | Freq: Every day | ORAL | Status: DC
  Administered 2016-10-10 – 2016-10-13 (×4): 17 g via ORAL
  Filled 2016-10-10 (×4): qty 1

## 2016-10-10 MED ORDER — METOCLOPRAMIDE HCL 5 MG/ML IJ SOLN
10.0000 mg | Freq: Four times a day (QID) | INTRAMUSCULAR | Status: AC
  Administered 2016-10-10 – 2016-10-11 (×4): 10 mg via INTRAVENOUS
  Filled 2016-10-10 (×4): qty 2

## 2016-10-10 MED ORDER — DEXTROSE-NACL 5-0.45 % IV SOLN
INTRAVENOUS | Status: DC
  Administered 2016-10-10 – 2016-10-11 (×4): via INTRAVENOUS

## 2016-10-10 NOTE — Progress Notes (Signed)
Pt with pain and nausea this am. Pt states, " I just don't think I am going to make it." Pt states, " I was too weak to stand up this morning." Pt states he has no appetite and doesn't want anything to eat this am. Pt external pacer not capturing at 12.5 MA. Pt underlying rhythm is NSR but rate is 61. Turned up to 15 so it would capture. Tramadol given, zofran given. PA notified.

## 2016-10-10 NOTE — Op Note (Signed)
NAME:  Jason [name], Jason [name]                    ACCOUNT NO.:  MEDICAL RECORD NO.:  [id number]  LOCATION:                                 FACILITY:  PHYSICIAN:  Sheliah Plane, MD         DATE OF BIRTH:  DATE OF PROCEDURE:  10/07/2016 DATE OF DISCHARGE:                              OPERATIVE REPORT   PREOPERATIVE DIAGNOSIS:  Coronary occlusive disease with recent anterior myocardial infarction, ST elevation myocardial infarction.  POSTOPERATIVE DIAGNOSIS:  Coronary occlusive disease with recent anterior myocardial infarction, ST elevation myocardial infarction.  SURGICAL PROCEDURE:  Coronary artery bypass grafting x3 with left internal mammary to the left anterior descending coronary artery, reverse saphenous vein graft to the second obtuse marginal coronary artery, reverse saphenous vein graft to the posterior descending coronary artery rising off the distal circumflex with right thigh greater saphenous endo vein harvesting.  SURGEON:  Sheliah Plane, MD.  FIRST ASSISTANT:  Jari Favre, [place].  BRIEF HISTORY:  The patient is an 83 year old male, who 5 days earlier had presented with acute anterior myocardial infarction.  He was loaded with Brilinta and taken to the cath lab.  At the time of catheterization, he had reperfused spontaneously his LAD.  In addition, the patient was found to have at least 50% to 60% ostial left main disease; high-grade LAD disease; a small nondominant right system with the posterior descending arising from the distal circumflex; and disease in the obtuse marginal.  The ventricular function was depressed with 40% to 45% ejection fraction and anterior hypokinesis.  The patient was stabilized medically, and because of the Brilinta, period of time was waited for washout and return of the P2Y12 to normal levels.  Risks and options were discussed with the patient in detail and he agreed and signed informed consent.  DESCRIPTION OF PROCEDURE:  With Swan-Ganz and  arterial line monitors in place, the patient underwent general endotracheal anesthesia without incidence.  The skin of the chest and legs was prepped with Betadine and draped in the usual sterile manner.  TEE probe was placed by Dr. Jean Rosenthal, which confirmed anterior apical hypokinesis.  The patient also was noted to have mild aortic insufficiency with a small central jet. Appropriate time-out was performed, and we proceeded with endoscopic vein harvesting of the right greater saphenous vein, which was of good quality and caliber.  Median sternotomy was performed.  Left internal mammary artery was dissected down as a pedicle graft.  The distal artery was divided and had good free flow.  Pericardium was opened.  Overall, ventricular function appeared preserved with exception of the hypokinesis of the anterior wall.  The patient was systemically heparinized.  Ascending aorta was cannulated.  The right atrium was cannulated.  An aortic root vent cardioplegia needle was introduced into the ascending aorta.  The patient was placed on cardiopulmonary bypass 2.4 L/min/m2.  Sites of anastomosis were selected and dissected out of the epicardium.  The patient's body temperature was cooled to 32 degrees.  Aortic crossclamp was applied.  500 mL of cold blood potassium cardioplegia was administered with diastolic arrest of the heart. Myocardial septal temperature was monitored throughout the crossclamp.  Attention was turned first to the posterior descending coronary artery, which was opened and admitted a 1.5 mm probe distally.  Using a running 7-0 Prolene, distal anastomosis was performed with a segment of reverse saphenous vein graft.  The heart was then elevated and the second obtuse marginal branch, which was the largest of the obtuse marginal branches was identified, opened, and admitted 1.5 mm probe distally.  Using a running 7-0 Prolene, distal anastomosis was performed.  Additional cold blood  cardioplegia was administered down the vein grafts.  We then turned our attention to the left anterior descending coronary artery in the midportion of the vessel.  The LAD was opened, admitted a 1.5 mm probe distally.  Using a running 8-0 Prolene, the left internal mammary artery was anastomosed to the left anterior descending coronary artery. Bulldog was removed from the mammary artery with prompt rise in myocardial septal temperature.  The bulldog was placed back on the mammary artery.  Additional cold blood cardioplegia was administered, and with the cross-clamp still in place, 2 punch aortotomies were performed and each of the two vein grafts were anastomosed to the ascending aorta.  The bulldog was removed from the mammary artery with prompt rise in myocardial septal temperature.  Aortic crossclamp was removed.  Total crossclamp time was 74 minutes.  Sites anastomosed were inspected and were free of bleeding.  The patient spontaneously converted to a sinus rhythm.  The body temperature was rewarmed to 37 degrees.  He was ventilated and weaned from cardiopulmonary bypass without difficulty.  Atrial and ventricular pacing wires were applied. Graft marker was applied.  Left pleural tube and a Blake mediastinal drain were left in place.  Pericardium was loosely reapproximated. Sternum was closed with #6 stainless steel wires.  Fascia closed with interrupted 0 Vicryl, running 3-0 Vicryl in subcutaneous tissue, 3-0 subcuticular stitches in skin edges.  Dry dressings were applied. Sponge and needle count was reported as correct at completion of procedure.  The patient tolerated the procedure without obvious complication.  Total crossclamp time was 74 minutes.  Total pump time 105 minutes.  The patient did not require any blood bank blood products during the operative procedure.     Sheliah Plane, MD     EG/MEDQ  D:  10/08/2016  T:  10/08/2016  Job:  [id number]  cc:   Lyn Records,  M.D.

## 2016-10-10 NOTE — Progress Notes (Addendum)
      [address]       [place] [id number]             [phone]      3 Days Post-Op Procedure(s) (LRB): CORONARY ARTERY BYPASS GRAFTING (CABG) x 3 , using internal mammary, and right greater saphenous vein harvested endoscopically SVG-PD, SVG-OM, LIMA-LAD (N/A) TRANSESOPHAGEAL ECHOCARDIOGRAM (TEE) (N/A) Subjective: Feels bad this morning. Diaphoretic and feels nauseous.   Objective: Vital signs in last 24 hours: Temp:  [97.5 F (36.4 C)-98.5 F (36.9 C)] 98.2 F (36.8 C) (04/16 0800) Pulse Rate:  [71-87] 84 (04/16 0850) Cardiac Rhythm: Atrial paced (04/16 0852) Resp:  [12-34] 14 (04/16 0850) BP: (109-137)/(49-76) 109/54 (04/16 0800) SpO2:  [91 %-99 %] 94 % (04/16 0850) Weight:  [75.7 kg (166 lb 14.2 oz)] 75.7 kg (166 lb 14.2 oz) (04/16 0500)     Intake/Output from previous day: 04/15 0701 - 04/16 0700 In: 720 [P.O.:720] Out: 555 [Urine:555] Intake/Output this shift: Total I/O In: -  Out: 20 [Urine:20]  General appearance: alert, cooperative and mild distress Heart: regular rate and rhythm, S1, S2 normal, no murmur, click, rub or gallop Lungs: clear to auscultation bilaterally Abdomen: distended, + tenderness, decreased bowel sounds Extremities: extremities normal, atraumatic, no cyanosis or edema Wound: clean and dry dressed with sterile dressing  Lab Results:  Recent Labs  10/09/16 0405 10/10/16 0400  WBC 11.5* 12.5*  HGB 8.2* 8.4*  HCT 24.3* 25.1*  PLT 89* 111*   BMET:  Recent Labs  10/09/16 1719 10/10/16 0400  NA 129* 132*  K 3.9 4.1  CL 98* 99*  CO2 23 25  GLUCOSE 143* 112*  BUN 29* 31*  CREATININE 1.74* 1.50*  CALCIUM 7.5* 7.7*    PT/INR:  Recent Labs  10/07/16 1343  LABPROT 18.8*  INR 1.55   ABG    Component Value Date/Time   PHART 7.417 10/08/2016 0021   HCO3 21.3 10/08/2016 0021   TCO2 21 10/08/2016 1740   ACIDBASEDEF 3.0 (H) 10/08/2016 0021   O2SAT 97.0 10/08/2016 0021   CBG (last 3)   Recent Labs  10/09/16 1621 10/09/16 2117 10/10/16 0831  GLUCAP 124* 115* 124*    Assessment/Plan: S/P Procedure(s) (LRB): CORONARY ARTERY BYPASS GRAFTING (CABG) x 3 , using internal mammary, and right greater saphenous vein harvested endoscopically SVG-PD, SVG-OM, LIMA-LAD (N/A) TRANSESOPHAGEAL ECHOCARDIOGRAM (TEE) (N/A)  1. CV- NSR in the 60s. A-paced at 86. BP has improved this morning. Continue to hold BB.  2. Pulm-tolerating 1L [place] with good oxygen saturation. Encourage IS  3.Renal-creatinine decreased from 1.74 to 1.50. Electrolytes okay. Keep the foley for now.  4.GI- distended abdomen. KUB ordered. NPO for now. On multiple bowel agents.   5. Some confusion-only on tramadol this morning for pain. Holding narcotics. He has a history of confusion with narcotic pain medication.    Plan: KUB. NPO. Encourage IS. Up to the chair as tolerated. Tramadol for pain.    LOS: 9 days    Sharlene Dory 10/10/2016 Patient seen and examined, agree with above More symptomatic from ileus this AM Abdomen is distended. He says he passed a small amount of gas earlier KUB done but not read yet. Does not look like an obstruction. Will continue reglan, start IVF. Place NG if he has emesis  Viviann Spare C. Dorris Fetch, MD [facility] [phone]

## 2016-10-10 NOTE — Progress Notes (Signed)
[facility] BRIEF SICU PROGRESS NOTE  3 Days Post-Op  S/P Procedure(s) (LRB): CORONARY ARTERY BYPASS GRAFTING (CABG) x 3 , using internal mammary, and right greater saphenous vein harvested endoscopically SVG-PD, SVG-OM, LIMA-LAD (N/A) TRANSESOPHAGEAL ECHOCARDIOGRAM (TEE) (N/A)   Stable day.  + flatus reported.  No BM Stomach feels "tight" but not painful.  No nausea.  Ambulated some.  Denies SOB AAI paced w/ stable BP Breathing comfortably w/ O2 sats 95-97% on 1 L/min Abdomen distended but soft, minimally tender  Plan: Continue NPO for now.  Mobilize.  Watch abdominal exam  Purcell Nails, MD 10/10/2016 6:10 PM

## 2016-10-10 NOTE — Progress Notes (Addendum)
Pt ambulated 270 ft around unit. Pt now sitting in chair. Will continue to monitor closely.

## 2016-10-11 LAB — BASIC METABOLIC PANEL
Anion gap: 10 (ref 5–15)
BUN: 26 mg/dL — AB (ref 6–20)
CALCIUM: 7.7 mg/dL — AB (ref 8.9–10.3)
CO2: 23 mmol/L (ref 22–32)
CREATININE: 1.47 mg/dL — AB (ref 0.61–1.24)
Chloride: 99 mmol/L — ABNORMAL LOW (ref 101–111)
GFR calc Af Amer: 49 mL/min — ABNORMAL LOW (ref >60)
GFR calc non Af Amer: 42 mL/min — ABNORMAL LOW (ref >60)
Glucose, Bld: 132 mg/dL — ABNORMAL HIGH (ref 65–99)
Potassium: 3.6 mmol/L (ref 3.5–5.1)
Sodium: 132 mmol/L — ABNORMAL LOW (ref 135–145)

## 2016-10-11 LAB — CBC
HEMATOCRIT: 24.8 % — AB (ref 39.0–52.0)
HEMOGLOBIN: 8.2 g/dL — AB (ref 13.0–17.0)
MCH: 29.4 pg (ref 26.0–34.0)
MCHC: 33.1 g/dL (ref 30.0–36.0)
MCV: 88.9 fL (ref 78.0–100.0)
Platelets: 192 10*3/uL (ref 150–400)
RBC: 2.79 MIL/uL — ABNORMAL LOW (ref 4.22–5.81)
RDW: 14.3 % (ref 11.5–15.5)
WBC: 12.4 10*3/uL — ABNORMAL HIGH (ref 4.0–10.5)

## 2016-10-11 LAB — GLUCOSE, CAPILLARY
GLUCOSE-CAPILLARY: 105 mg/dL — AB (ref 65–99)
Glucose-Capillary: 125 mg/dL — ABNORMAL HIGH (ref 65–99)
Glucose-Capillary: 122 mg/dL — ABNORMAL HIGH (ref 65–99)
Glucose-Capillary: 118 mg/dL — ABNORMAL HIGH (ref 65–99)

## 2016-10-11 MED ORDER — METOCLOPRAMIDE HCL 5 MG/ML IJ SOLN
10.0000 mg | Freq: Four times a day (QID) | INTRAMUSCULAR | Status: AC
  Administered 2016-10-11 (×4): 10 mg via INTRAVENOUS
  Filled 2016-10-11 (×4): qty 2

## 2016-10-11 MED ORDER — LACTULOSE 10 GM/15ML PO SOLN
20.0000 g | Freq: Once | ORAL | Status: AC
  Administered 2016-10-11: 20 g via ORAL
  Filled 2016-10-11: qty 30

## 2016-10-11 MED ORDER — DIPHENHYDRAMINE HCL 25 MG PO CAPS
25.0000 mg | ORAL_CAPSULE | Freq: Every evening | ORAL | Status: DC | PRN
  Administered 2016-10-12: 25 mg via ORAL
  Filled 2016-10-11 (×2): qty 1

## 2016-10-11 NOTE — Progress Notes (Signed)
4 Days Post-Op Procedure(s) (LRB): CORONARY ARTERY BYPASS GRAFTING (CABG) x 3 , using internal mammary, and right greater saphenous vein harvested endoscopically SVG-PD, SVG-OM, LIMA-LAD (N/A) TRANSESOPHAGEAL ECHOCARDIOGRAM (TEE) (N/A) Subjective: Didn't sleep well last night Says "everything" is bothering him  Objective: Vital signs in last 24 hours: Temp:  [97.6 F (36.4 C)-98.3 F (36.8 C)] 98.3 F (36.8 C) (04/17 0700) Pulse Rate:  [84-86] 85 (04/17 0700) Cardiac Rhythm: Atrial paced (04/17 0700) Resp:  [14-34] 25 (04/17 0700) BP: (69-140)/(49-86) 140/68 (04/17 0700) SpO2:  [94 %-100 %] 98 % (04/17 0700) Weight:  [164 lb 14.5 oz (74.8 kg)] 164 lb 14.5 oz (74.8 kg) (04/17 0332)  Hemodynamic parameters for last 24 hours:    Intake/Output from previous day: 04/16 0701 - 04/17 0700 In: 2421.7 [P.O.:440; I.V.:1981.7] Out: 785 [Urine:785] Intake/Output this shift: No intake/output data recorded.  General appearance: alert, cooperative and mild distress Neurologic: intact Heart: regular rate and rhythm Lungs: diminished breath sounds bibasilar Abdomen: distended but improved BS Wound: clean and dry  Lab Results:  Recent Labs  10/09/16 0405 10/10/16 0400  WBC 11.5* 12.5*  HGB 8.2* 8.4*  HCT 24.3* 25.1*  PLT 89* 111*   BMET:  Recent Labs  10/09/16 1719 10/10/16 0400  NA 129* 132*  K 3.9 4.1  CL 98* 99*  CO2 23 25  GLUCOSE 143* 112*  BUN 29* 31*  CREATININE 1.74* 1.50*  CALCIUM 7.5* 7.7*    PT/INR: No results for input(s): LABPROT, INR in the last 72 hours. ABG    Component Value Date/Time   PHART 7.417 10/08/2016 0021   HCO3 21.3 10/08/2016 0021   TCO2 21 10/08/2016 1740   ACIDBASEDEF 3.0 (H) 10/08/2016 0021   O2SAT 97.0 10/08/2016 0021   CBG (last 3)   Recent Labs  10/10/16 1610 10/10/16 2113 10/11/16 0725  GLUCAP 112* 124* 125*    Assessment/Plan: S/P Procedure(s) (LRB): CORONARY ARTERY BYPASS GRAFTING (CABG) x 3 , using internal  mammary, and right greater saphenous vein harvested endoscopically SVG-PD, SVG-OM, LIMA-LAD (N/A) TRANSESOPHAGEAL ECHOCARDIOGRAM (TEE) (N/A) -CV- stable in SR  RESP- IS for atelectasis  RENAL- no labs this AM, good Uo  Check BMET  Dc Foley  ENDO- CBG well controlled  Anemia- secondary to ABL- follow  Thrombocytopenia- improved yesterday.   SCD + enoxaparin for DVT prophylaxis   LOS: 10 days    Loreli Slot 10/11/2016

## 2016-10-11 NOTE — Progress Notes (Signed)
Patient ID: Jason [name], male   DOB: [birth year], 83 y.o.   MRN: [id number]  SICU Evening Rounds:  Hemodynamically stable  sats 95% on 2L  Has been restless and uncomfortable all day but nothing specific.  Had BM.  Urine output ok.  I think he has some postop delirium and should improve with time.

## 2016-10-11 NOTE — Care Management Note (Signed)
Case Management Note Previous Note by Lawerance Sabal  Patient Details  Name: Jason [name] MRN: [id number] Date of Birth: 05-28-1930  Subjective/Objective:                 Patient from home with wife. Plan is for coronary artery bypass grafting At the end of this week. Wife sts she is not able to physically help him at home and in fact, he has been doing the cooking/cleaning for a while. If he needs much assist at d/c, he might need SNF for a short time.    Action/Plan:  CM will continue to follow.   Expected Discharge Date:                  Expected Discharge Plan:  Home w Home Health Services  In-House Referral:     Discharge planning Services  CM Consult  Post Acute Care Choice:    Choice offered to:     DME Arranged:    DME Agency:     HH Arranged:    HH Agency:     Status of Service:  In process, will continue to follow  If discussed at Long Length of Stay Meetings, dates discussed:    Additional Comments: 10/11/2016 Pt is now s/p CABG - PT eval ordered to ensure safe discharge home Cherylann Parr, RN 10/11/2016, 1:24 PM

## 2016-10-12 ENCOUNTER — Inpatient Hospital Stay (HOSPITAL_COMMUNITY): Payer: Medicare Other

## 2016-10-12 LAB — BASIC METABOLIC PANEL
ANION GAP: 8 (ref 5–15)
BUN: 25 mg/dL — ABNORMAL HIGH (ref 6–20)
CALCIUM: 7.5 mg/dL — AB (ref 8.9–10.3)
CO2: 23 mmol/L (ref 22–32)
CREATININE: 1.51 mg/dL — AB (ref 0.61–1.24)
Chloride: 101 mmol/L (ref 101–111)
GFR, EST AFRICAN AMERICAN: 47 mL/min — AB (ref >60)
GFR, EST NON AFRICAN AMERICAN: 41 mL/min — AB (ref >60)
Glucose, Bld: 76 mg/dL (ref 65–99)
Potassium: 3.8 mmol/L (ref 3.5–5.1)
Sodium: 132 mmol/L — ABNORMAL LOW (ref 135–145)

## 2016-10-12 LAB — GLUCOSE, CAPILLARY
GLUCOSE-CAPILLARY: 86 mg/dL (ref 65–99)
Glucose-Capillary: 94 mg/dL (ref 65–99)
Glucose-Capillary: 89 mg/dL (ref 65–99)
Glucose-Capillary: 99 mg/dL (ref 65–99)

## 2016-10-12 LAB — CBC
HCT: 24.2 % — ABNORMAL LOW (ref 39.0–52.0)
Hemoglobin: 8.2 g/dL — ABNORMAL LOW (ref 13.0–17.0)
MCH: 30.5 pg (ref 26.0–34.0)
MCHC: 33.9 g/dL (ref 30.0–36.0)
MCV: 90 fL (ref 78.0–100.0)
PLATELETS: 200 10*3/uL (ref 150–400)
RBC: 2.69 MIL/uL — ABNORMAL LOW (ref 4.22–5.81)
RDW: 14.7 % (ref 11.5–15.5)
WBC: 9.9 10*3/uL (ref 4.0–10.5)

## 2016-10-12 NOTE — Plan of Care (Signed)
Problem: Activity: Goal: Risk for activity intolerance will decrease Outcome: Progressing Pt walked x3  Problem: Bowel/Gastric: Goal: Gastrointestinal status for postoperative course will improve Outcome: Progressing BM 10/11/2016   Problem: Cardiac: Goal: Hemodynamic stability will improve Outcome: Progressing Within parameters

## 2016-10-12 NOTE — Evaluation (Signed)
Physical Therapy Evaluation Patient Details Name: Jason [name] MRN: [id number] DOB: [birth year] Today's Date: 10/12/2016   History of Present Illness  83 y.o. male s/p CABG on 10/11/16. PMH significant for depression, anxiety, osteomyelitis of skull, colitis, GERD and hypothyroidism.   Clinical Impression  Pt presents with decreased strength, balance, and activity tolerance. Pt with decreased cognition and slow processing requiring multimodal cues throughout session. 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 Recommend d/c to SNF for safe transition home when medically ready. Acute PT will follow.     Follow Up Recommendations SNF;Supervision/Assistance - 24 hour    Equipment Recommendations  None recommended by PT    Recommendations for Other Services       Precautions / Restrictions Precautions Precautions: Fall;Sternal Precaution Comments: pt able to recall 3/3 sternal precautions Restrictions Weight Bearing Restrictions: Yes (sternal precautions) Other Position/Activity Restrictions: sternal precautions      Mobility  Bed Mobility Overal bed mobility: Needs Assistance Bed Mobility: Sit to Sidelying;Rolling Rolling: Min assist       Sit to sidelying: Max assist General bed mobility comments: pt required maxA to elevate LE to bed. pt required verbal cues for sequencing of sidelying to log roll technique and assist for lowering trunk to surface. Once in bed, pt required maxA to adjust shoulders/trunk in bed and LE. Pt was totalA of 2 to move up in bed  Transfers Overall transfer level: Needs assistance Equipment used: Rolling walker (2 wheeled) Transfers: Sit to/from Stand Sit to Stand: Mod assist         General transfer comment: modA to power up. verbal cues to move forward in chair and for forward flexion. verbal cues for compliance with sternal precautions and safety with RW. increase time to come to  standing. verbal cues for safety with RW  Ambulation/Gait Ambulation/Gait assistance: Min assist Ambulation Distance (Feet): 240 Feet Assistive device: Rolling walker (2 wheeled) Gait Pattern/deviations: Step-through pattern;Decreased stride length;Trunk flexed Gait velocity: decreased Gait velocity interpretation: Below normal speed for age/gender General Gait Details: verbal cues for upright posture and safety with RW. multimodal cues for navigating walker as pt unaware that he was steering into objects. MinA for balance to prevent LOB as pt with L trunk lean during amb  Stairs            Wheelchair Mobility    Modified Rankin (Stroke Patients Only)       Balance Overall balance assessment: Needs assistance Sitting-balance support: Feet supported;No upper extremity supported Sitting balance-Leahy Scale: Fair     Standing balance support: Bilateral upper extremity supported Standing balance-Leahy Scale: Poor Standing balance comment: reliant on RW                             Pertinent Vitals/Pain Pain Assessment: 0-10 Pain Score: 4  Pain Location: sternal incision Pain Descriptors / Indicators: Discomfort Pain Intervention(s): Limited activity within patient's tolerance;Monitored during session;Repositioned    Home Living Family/patient expects to be discharged to:: Private residence Living Arrangements: Spouse/significant other Available Help at Discharge: Other (Comment) (wife unable to provide assist) Type of Home: House Home Access: Level entry     Home Layout: Two level;Able to live on main level with bedroom/bathroom Home Equipment: Dan Humphreys - 2 wheels;Cane - single point;Shower seat - built in Additional Comments: pt lives at home with wife who is there 24/7, however, unable to provide assist.  Prior Function Level of Independence: Independent         Comments: pt was driving and was primary caretaker of wife     Hand Dominance    Dominant Hand: Right    Extremity/Trunk Assessment   Upper Extremity Assessment Upper Extremity Assessment: Generalized weakness    Lower Extremity Assessment Lower Extremity Assessment: Generalized weakness    Cervical / Trunk Assessment Cervical / Trunk Assessment: Kyphotic  Communication   Communication: No difficulties  Cognition Arousal/Alertness: Awake/alert Behavior During Therapy: Flat affect Overall Cognitive Status: Impaired/Different from baseline Area of Impairment: Memory;Following commands;Safety/judgement;Problem solving                     Memory: Decreased short-term memory Following Commands: Follows one step commands consistently Safety/Judgement: Decreased awareness of safety;Decreased awareness of deficits   Problem Solving: Slow processing;Difficulty sequencing;Requires verbal cues;Requires tactile cues General Comments: pt required repeated reminders to stay on task. pt with difficulty sequencing during return to bed and required verbal cues for step-by-step directions.       General Comments      Exercises     Assessment/Plan    PT Assessment Patient needs continued PT services  PT Problem List Decreased strength;Decreased activity tolerance;Decreased balance;Decreased mobility;Decreased knowledge of use of DME;Decreased cognition;Decreased safety awareness;Pain       PT Treatment Interventions DME instruction;Gait training;Stair training;Functional mobility training;Therapeutic activities;Therapeutic exercise;Balance training;Neuromuscular re-education;Cognitive remediation;Patient/family education    PT Goals (Current goals can be found in the Care Plan section)  Acute Rehab PT Goals Patient Stated Goal: get back healthy PT Goal Formulation: With patient Time For Goal Achievement: 10/26/16 Potential to Achieve Goals: Fair    Frequency Min 3X/week   Barriers to discharge Decreased caregiver support pt does not have anyone to  provide assist at d/c    Co-evaluation               End of Session Equipment Utilized During Treatment: Gait belt;Oxygen (2L O2) Activity Tolerance: Patient tolerated treatment well Patient left: in bed;with call bell/phone within reach;with nursing/sitter in room Nurse Communication: Mobility status PT Visit Diagnosis: Unsteadiness on feet (R26.81);Other abnormalities of gait and mobility (R26.89);Muscle weakness (generalized) (M62.81);Difficulty in walking, not elsewhere classified (R26.2)    Time: 4782-9562 PT Time Calculation (min) (ACUTE ONLY): 24 min   Charges:   PT Evaluation $PT Eval Moderate Complexity: 1 Procedure PT Treatments $Gait Training: 8-22 mins   PT G CodesLane Hacker, SPT Acute Rehab SPT [phone]    Lane Hacker 10/12/2016, 9:55 AM

## 2016-10-12 NOTE — Progress Notes (Signed)
5 Days Post-Op Procedure(s) (LRB): CORONARY ARTERY BYPASS GRAFTING (CABG) x 3 , using internal mammary, and right greater saphenous vein harvested endoscopically SVG-PD, SVG-OM, LIMA-LAD (N/A) TRANSESOPHAGEAL ECHOCARDIOGRAM (TEE) (N/A) Subjective: No specific complaints Had BM yesterday Walked this morning  Objective: Vital signs in last 24 hours: Temp:  [97.8 F (36.6 C)-98.4 F (36.9 C)] 97.8 F (36.6 C) (04/18 0700) Pulse Rate:  [59-88] 63 (04/18 0700) Cardiac Rhythm: Normal sinus rhythm (04/18 0030) Resp:  [14-37] 21 (04/18 0700) BP: (92-141)/(49-86) 133/64 (04/18 0700) SpO2:  [88 %-99 %] 96 % (04/18 0700) Weight:  [76.3 kg (168 lb 3.4 oz)] 76.3 kg (168 lb 3.4 oz) (04/18 0500)  Hemodynamic parameters for last 24 hours:    Intake/Output from previous day: 04/17 0701 - 04/18 0700 In: 1320 [P.O.:320; I.V.:1000] Out: 750 [Urine:750] Intake/Output this shift: No intake/output data recorded.  General appearance: alert and cooperative Neurologic: intact Heart: regular rate and rhythm, S1, S2 normal, no murmur, click, rub or gallop Lungs: diminished breath sounds base - left Abdomen: soft, non-tender; bowel sounds normal, mildly distended Extremities: edema mild Wound: incisions ok  Lab Results:  Recent Labs  10/10/16 0400 10/11/16 0819  WBC 12.5* 12.4*  HGB 8.4* 8.2*  HCT 25.1* 24.8*  PLT 111* 192   BMET:  Recent Labs  10/10/16 0400 10/11/16 0819  NA 132* 132*  K 4.1 3.6  CL 99* 99*  CO2 25 23  GLUCOSE 112* 132*  BUN 31* 26*  CREATININE 1.50* 1.47*  CALCIUM 7.7* 7.7*    PT/INR: No results for input(s): LABPROT, INR in the last 72 hours. ABG    Component Value Date/Time   PHART 7.417 10/08/2016 0021   HCO3 21.3 10/08/2016 0021   TCO2 21 10/08/2016 1740   ACIDBASEDEF 3.0 (H) 10/08/2016 0021   O2SAT 97.0 10/08/2016 0021   CBG (last 3)   Recent Labs  10/11/16 1556 10/11/16 2124 10/12/16 0738  GLUCAP 118* 105* 94   CLINICAL DATA:  Status post  cardiac surgery  EXAM: PORTABLE CHEST 1 VIEW  COMPARISON:  10/10/2016  FINDINGS: Cardiac shadow is stable. Postsurgical changes are again noted. Persistent left basilar changes are noted with new small pleural effusion. Fullness in the right peritracheal region is noted related to patient rotation to the right. No other focal abnormality is seen.  IMPRESSION: Slight increase in left basilar atelectasis/ infiltrate and small left effusion.   Electronically Signed   By: Alcide Clever M.D.   On: 10/12/2016 07:58   Assessment/Plan: S/P Procedure(s) (LRB): CORONARY ARTERY BYPASS GRAFTING (CABG) x 3 , using internal mammary, and right greater saphenous vein harvested endoscopically SVG-PD, SVG-OM, LIMA-LAD (N/A) TRANSESOPHAGEAL ECHOCARDIOGRAM (TEE) (N/A)  He is hemodynamically stable in sinus rhythm  Volume excess: weight is 16 lbs over preop. Continue diuresis.  Expected acute postop blood loss anemia: observe and start Fe2+.  Postop ileus resolving  Acute postop renal dysfunction improving. Labs pending today.  Will transfer to 2W stepdown and continue mobilization, IS, advance diet as tolerated.   LOS: 11 days    Alleen Borne 10/12/2016

## 2016-10-12 NOTE — Clinical Social Work Note (Signed)
Clinical Social Work Assessment  Patient Details  Name: Jason [name] MRN: [id number] Date of Birth: 1929/07/27  Date of referral:  10/12/16               Reason for consult:  Facility Placement                Permission sought to share information with:  Facility Medical sales representative, Family Supports Permission granted to share information::  Yes, Verbal Permission Granted  Name::     Kerri Perches  Agency::  SNF  Relationship::  wife  Contact Information:     Housing/Transportation Living arrangements for the past 2 months:  Single Family Home Source of Information:  Patient Patient Interpreter Needed:  None Criminal Activity/Legal Involvement Pertinent to Current Situation/Hospitalization:  No - Comment as needed Significant Relationships:  Spouse Lives with:  Spouse Do you feel safe going back to the place where you live?    Need for family participation in patient care:  No (Coment)  Care giving concerns: Pt lives at home with wife- per patient would not be capable of lending any physical assistance- does not think she would be able to care for him with new impairment.   Social Worker assessment / plan: CSW spoke with pt concerning PT recommendation for SNF.  Patient acknowledges that he needs more help than normal following surgery.  CSW explained SNF and SNF referral process.  Discussed importance of having 24 hour help/assistance after his surgery.  Employment status:  Retired Database administrator PT Recommendations:  Skilled Nursing Facility Information / Referral to community resources:  Skilled Nursing Facility  Patient/Family's Response to care: Pt is agreeable to SNF at time of DC for some short term rehab- requests CSW call his wife to inform of this recommendation- CSW left message for wife.  Patient/Family's Understanding of and Emotional Response to Diagnosis, Current Treatment, and Prognosis:  Pt seemed somewhat flat during interview but  had good understanding of his current medical plan.  Emotional Assessment Appearance:  Appears stated age Attitude/Demeanor/Rapport:    Affect (typically observed):  Appropriate Orientation:  Oriented to Self, Oriented to Place, Oriented to  Time, Oriented to Situation Alcohol / Substance use:  Not Applicable Psych involvement (Current and /or in the community):  No (Comment)  Discharge Needs  Concerns to be addressed:  Care Coordination Readmission within the last 30 days:  No Current discharge risk:  Physical Impairment Barriers to Discharge:  Continued Medical Work up   Burna Sis, LCSW 10/12/2016, 3:20 PM

## 2016-10-12 NOTE — NC FL2 (Signed)
[place] MEDICAID FL2 LEVEL OF CARE SCREENING TOOL     IDENTIFICATION  Patient Name: Jason [name] Birthdate: [birth year] Sex: male Admission Date (Current Location): 10/01/2016  [facility] and [place] Number:  Producer, television/film/video and Address:  [facility] [place], [address] [place], [place] [address]      Provider Number: [id number]  Attending Physician Name and Address:  [name], MD  Relative Name and Phone Number:       Current Level of Care: Hospital Recommended Level of Care: Skilled Nursing Facility Prior Approval Number:    Date Approved/Denied:   PASRR Number:    Discharge Plan: SNF    Current Diagnoses: Patient Active Problem List   Diagnosis Date Noted  . 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 ACS (acute coronary syndrome) (HCC) 10/01/2016  . ST elevation myocardial infarction (STEMI) (HCC)   . Dysarthria 06/02/2014  . Gastrostomy in place [facility]) 10/11/2013  . Osteomyelitis (HCC) 08/07/2013  . Acute sinusitis 07/25/2013  . Osteomyelitis of skull (HCC) 07/25/2013  . Hyperglycemia 07/25/2013  . Normocytic anemia 07/25/2013  . Dysphagia 07/25/2013  . Protein-calorie malnutrition, severe (HCC) 07/02/2013  . Loss of weight 03/24/2013  . GERD (gastroesophageal reflux disease) 03/24/2013    Orientation RESPIRATION BLADDER Height & Weight     Self, Time, Situation, Place  O2 (2L [place]) Continent Weight: 168 lb 3.4 oz (76.3 kg) Height:   (162.6 cm)  BEHAVIORAL SYMPTOMS/MOOD NEUROLOGICAL BOWEL NUTRITION STATUS      Continent Diet (see DC summary)  AMBULATORY STATUS COMMUNICATION OF NEEDS Skin   Limited Assist Verbally Normal                       Personal Care Assistance Level of Assistance  Bathing, Dressing Bathing Assistance: Limited assistance   Dressing Assistance: Limited assistance     Functional Limitations Info             SPECIAL CARE FACTORS FREQUENCY  PT (By licensed PT), OT (By licensed OT)     PT  Frequency: 5/wk OT Frequency: 5/wk            Contractures      Additional Factors Info  Code Status, Allergies Code Status Info: FULL Allergies Info: Bee Pollen, Tetanus Toxoids, Hydrocodone           Current Medications (10/12/2016):  This is the current hospital active medication list Current Facility-Administered Medications  Medication Dose Route Frequency Provider Last Rate Last Dose  . 0.45 % sodium chloride infusion   Intravenous Continuous PRN Sharlene Dory, PA-C   Stopped at 10/08/16 0900  . 0.9 %  sodium chloride infusion  250 mL Intravenous Continuous Tessa N Conte, PA-C      . 0.9 %  sodium chloride infusion   Intravenous Continuous Sharlene Dory, PA-C 10 mL/hr at 10/09/16 8469    . acetaminophen (TYLENOL) tablet 1,000 mg  1,000 mg Oral Q6H Sharlene Dory, PA-C   1,000 mg at 10/12/16 1113   Or  . acetaminophen (TYLENOL) solution 1,000 mg  1,000 mg Per Tube Q6H Tessa N Conte, PA-C      . aspirin EC tablet 325 mg  325 mg Oral Daily Sharlene Dory, PA-C   325 mg at 10/12/16 6295   Or  . aspirin chewable tablet 324 mg  324 mg Per Tube Daily Tessa N Conte, PA-C      . atorvastatin (LIPITOR) tablet 80  mg  80 mg Oral q1800 Sharlene Dory, PA-C   80 mg at 10/11/16 1729  . bisacodyl (DULCOLAX) EC tablet 10 mg  10 mg Oral Daily Sharlene Dory, PA-C   10 mg at 10/12/16 1610   Or  . bisacodyl (DULCOLAX) suppository 10 mg  10 mg Rectal Daily Sharlene Dory, PA-C      . diphenhydrAMINE (BENADRYL) capsule 25 mg  25 mg Oral QHS PRN Loreli Slot, MD      . docusate sodium (COLACE) capsule 200 mg  200 mg Oral Daily Sharlene Dory, PA-C   200 mg at 10/12/16 9604  . enoxaparin (LOVENOX) injection 40 mg  40 mg Subcutaneous QHS Loreli Slot, MD   40 mg at 10/11/16 2122  . lactated ringers infusion 500 mL  500 mL Intravenous Once PRN Sharlene Dory, PA-C      . lactated ringers infusion   Intravenous Continuous Sharlene Dory, PA-C      . lactated ringers infusion   Intravenous  Continuous Sharlene Dory, PA-C   Stopped at 10/09/16 0530  . MEDLINE mouth rinse  15 mL Mouth Rinse BID [name], MD   15 mL at 10/12/16 [phone]  . metoprolol (LOPRESSOR) injection 2.5-5 mg  2.5-5 mg Intravenous Q2H PRN Sharlene Dory, PA-C      . metoprolol tartrate (LOPRESSOR) tablet 12.5 mg  12.5 mg Oral BID Sharlene Dory, PA-C   12.5 mg at 10/12/16 8119   Or  . metoprolol tartrate (LOPRESSOR) 25 mg/10 mL oral suspension 12.5 mg  12.5 mg Per Tube BID Sharlene Dory, PA-C      . nitroGLYCERIN 50 mg in dextrose 5 % 250 mL (0.2 mg/mL) infusion  0-100 mcg/min Intravenous Titrated Sharlene Dory, PA-C   Stopped at 10/07/16 1340  . ondansetron (ZOFRAN) injection 4 mg  4 mg Intravenous Q6H PRN Sharlene Dory, PA-C   4 mg at 10/10/16 0846  . pantoprazole (PROTONIX) EC tablet 40 mg  40 mg Oral Daily Sharlene Dory, PA-C   40 mg at 10/12/16 1478  . phenylephrine (NEO-SYNEPHRINE) 20 mg in sodium chloride 0.9 % 250 mL (0.08 mg/mL) infusion  0-100 mcg/min Intravenous Titrated Sharlene Dory, PA-C   Stopped at 10/08/16 2216  . polyethylene glycol (MIRALAX / GLYCOLAX) packet 17 g  17 g Oral Daily Sharlene Dory, PA-C   17 g at 10/12/16 2956  . sodium chloride flush (NS) 0.9 % injection 3 mL  3 mL Intravenous Q12H Sharlene Dory, PA-C   3 mL at 10/12/16 2130  . sodium chloride flush (NS) 0.9 % injection 3 mL  3 mL Intravenous PRN Sharlene Dory, PA-C      . traMADol Janean Sark) tablet 50-100 mg  50-100 mg Oral Q4H PRN Sharlene Dory, PA-C   100 mg at 10/12/16 0607     Discharge Medications: Please see discharge summary for a list of discharge medications.  Relevant Imaging Results:  Relevant Lab Results:   Additional Information SS#: [id number]  Burna Sis, LCSW

## 2016-10-12 NOTE — Discharge Summary (Signed)
Physician Discharge Summary  Patient ID: Jason [name] MRN: [id number] DOB/AGE: April 06, 1930 83 y.o.  Admit date: 10/01/2016 Discharge date: 10/20/2016  Admission Diagnoses: Patient Active Problem List   Diagnosis Date Noted  . ACS (acute coronary syndrome) (HCC) 10/01/2016  . ST elevation myocardial infarction (STEMI) (HCC)   . Dysarthria 06/02/2014  . Gastrostomy in place [facility]) 10/11/2013  . Osteomyelitis (HCC) 08/07/2013  . Acute sinusitis 07/25/2013  . Osteomyelitis of skull (HCC) 07/25/2013  . Hyperglycemia 07/25/2013  . Normocytic anemia 07/25/2013  . Dysphagia 07/25/2013  . Protein-calorie malnutrition, severe (HCC) 07/02/2013  . Loss of weight 03/24/2013  . GERD (gastroesophageal reflux disease) 03/24/2013     Discharge Diagnoses:  Active Problems:   ACS (acute coronary syndrome) (HCC)   ST elevation myocardial infarction (STEMI) (HCC)   S/P CABG x 3   Discharged Condition: good  HPI:  Jason [name], Jason [name]                    ACCOUNT NO.:  MEDICAL RECORD NO.:  [id number]  LOCATION:                                 FACILITY:  PHYSICIAN:  Sheliah Plane, MD         DATE OF BIRTH:  DATE OF PROCEDURE:  10/07/2016 DATE OF DISCHARGE:                              OPERATIVE REPORT   PREOPERATIVE DIAGNOSIS:  Coronary occlusive disease with recent anterior myocardial infarction, ST elevation myocardial infarction.  POSTOPERATIVE DIAGNOSIS:  Coronary occlusive disease with recent anterior myocardial infarction, ST elevation myocardial infarction.  SURGICAL PROCEDURE:  Coronary artery bypass grafting x3 with left internal mammary to the left anterior descending coronary artery, reverse saphenous vein graft to the second obtuse marginal coronary artery, reverse saphenous vein graft to the posterior descending coronary artery rising off the distal circumflex with right thigh greater saphenous endo vein harvesting.  SURGEON:  Sheliah Plane, MD.  FIRST  ASSISTANT:  Jari Favre, [place].   Hospital Course:  Mr. Herne underwent a coronary artery bypass grafting 3 on 10/07/2016. He tolerated the procedure well and was transferred to the ICU. He is extubated in a timely manner. Postop day 1 he was doing very well. We discontinued his Swan-Ganz catheter and his arterial line. He remained hemodynamically stable. We initiated Lovenox and SCDs for DVT prophylaxis. We discontinued his chest tubes. His creatinine was stable. On postop day 2 he had some generalized malaise and some nausea. We added Reglan to his bowel regimen. His systolic blood pressure remained in the 90s therefore we gave a normal saline bolus. We continue to hold his beta blocker. His creatinine increased therefore we gave a 500 mL bolus of saline. We continued to mobilize the patient. Postop day 3 he continued to have some nausea and small abdominal pain. We ordered a KUB which was negative for bowel distraction. The patient was changed to nothing by mouth. He did have some confusion therefore he was only on tramadol for pain. We increased his bowel regimen. His creatinine began to trend down. His blood pressure improved this morning. Postop day 4 we were able to discontinue his Foley catheter. He finally had a bowel movement later that day. He remained fluid overloaded therefore we continued his diuretic regimen. We started iron supplementation  for expected acute postop anemia. His postop ileus was resolving with a positive bowel movement and positive flatus. He was transferred to 2 west stepdown unit for continued care. He continued to work on mobility. His pacing wires were removed since his rhythm remained stable. He increased his oral intake and was able to ambulate with assistance. He is now ready for a skilled nursing facility for continued care and rehabilitation. He is tolerating room air, ambulating with assistance, his incision is healing well, and he is ready for discharge. An extra day was  required to arrange sitter services for night time on the patient's end. Social work [place]. Will plan to go to [facility] today.   Consults: None  Significant Diagnostic Studies:   CLINICAL DATA:  Status post cardiac surgery  EXAM: PORTABLE CHEST 1 VIEW  COMPARISON:  10/10/2016  FINDINGS: Cardiac shadow is stable. Postsurgical changes are again noted. Persistent left basilar changes are noted with new small pleural effusion. Fullness in the right peritracheal region is noted related to patient rotation to the right. No other focal abnormality is seen.  IMPRESSION: Slight increase in left basilar atelectasis/ infiltrate and small left effusion.   Electronically Signed   By: Alcide Clever M.D.   On: 10/12/2016 07:58  Treatments:  NAME:  Jason [name], Jason [name]                    ACCOUNT NO.:  MEDICAL RECORD NO.:  [id number]  LOCATION:                                 FACILITY:  PHYSICIAN:  Sheliah Plane, MD         DATE OF BIRTH:  DATE OF PROCEDURE:  10/07/2016 DATE OF DISCHARGE:                              OPERATIVE REPORT   PREOPERATIVE DIAGNOSIS:  Coronary occlusive disease with recent anterior myocardial infarction, ST elevation myocardial infarction.  POSTOPERATIVE DIAGNOSIS:  Coronary occlusive disease with recent anterior myocardial infarction, ST elevation myocardial infarction.  SURGICAL PROCEDURE:  Coronary artery bypass grafting x3 with left internal mammary to the left anterior descending coronary artery, reverse saphenous vein graft to the second obtuse marginal coronary artery, reverse saphenous vein graft to the posterior descending coronary artery rising off the distal circumflex with right thigh greater saphenous endo vein harvesting.  SURGEON:  Sheliah Plane, MD.  FIRST ASSISTANT:  Jari Favre, [place].  Discharge Exam: Blood pressure (!) 141/61, pulse 77, temperature 98.3 F (36.8 C), temperature source Oral, resp.  rate 18, height  (1.626 m), weight 66.7 kg (147 lb), SpO2 93 %.   General appearance: alert, cooperative and no distress Heart: regular rate and rhythm, S1, S2 normal, no murmur, click, rub or gallop Lungs: clear to auscultation bilaterally Abdomen: soft, non-tender; bowel sounds normal; no masses,  no organomegaly Extremities: extremities normal, atraumatic, no cyanosis or edema Wound: clean and dry  Disposition: 01-Home or Self Care  Discharge Instructions    Amb Referral to Cardiac Rehabilitation    Complete by:  As directed    Diagnosis:  CABG   CABG X ___:  3     Allergies as of 10/20/2016      Reactions   Bee Pollen Anaphylaxis   Tetanus Toxoids Swelling   SWELLING REACTION UNSPECIFIED ? LOCAL REACTION ?  Hydrocodone Other (See Comments)   Confusion, "talking out of his head"      Medication List    STOP taking these medications   doxycycline 100 MG tablet Commonly known as:  VIBRA-TABS     TAKE these medications   aspirin 325 MG EC tablet Take 1 tablet (325 mg total) by mouth daily.   atorvastatin 10 MG tablet Commonly known as:  LIPITOR Take 1 tablet (10 mg total) by mouth daily at 6 PM.   bisacodyl 5 MG EC tablet Commonly known as:  DULCOLAX Take 2 tablets (10 mg total) by mouth daily.   bisacodyl 10 MG suppository Commonly known as:  DULCOLAX Place 1 suppository (10 mg total) rectally daily.   cephALEXin 500 MG capsule Commonly known as:  KEFLEX Take 1 capsule (500 mg total) by mouth 3 (three) times daily.   cyclobenzaprine 5 MG tablet Commonly known as:  FLEXERIL Take 1 tablet (5 mg total) by mouth at bedtime.   feeding supplement Liqd Take 1 Container by mouth 3 (three) times daily with meals.   metoprolol tartrate 25 MG tablet Commonly known as:  LOPRESSOR Take 0.5 tablets (12.5 mg total) by mouth 2 (two) times daily.   pantoprazole 40 MG tablet Commonly known as:  PROTONIX Take 1 tablet (40 mg total) by mouth daily.    polyethylene glycol packet Commonly known as:  MIRALAX / GLYCOLAX Take 17 g by mouth daily.   polyvinyl alcohol 1.4 % ophthalmic solution Commonly known as:  LIQUIFILM TEARS Place 2 drops into both eyes as needed for dry eyes.   traMADol 50 MG tablet Commonly known as:  ULTRAM Take 1 tablet (50 mg total) by mouth every 6 (six) hours as needed for moderate pain.       Contact information for follow-up providers    ARONSON,RICHARD A, MD. Call in 1 day(s).   Specialty:  Internal Medicine Contact information: [address] [place] [address] [phone]        [name], MD Follow up.   Specialty:  Cardiothoracic Surgery Why:  Your appointment is on May 24th at 4pm. Please arrive at 3:30pm for a chest xray at [facility] which is located on the first floor of our building.  Contact information: [address] [place] [address] [phone]        Nicolasa Ducking, NP. Go on 11/01/2016.   Specialties:  Nurse Practitioner, Cardiology, Radiology Why:  @ 10:30am to see Dr. Ludwig Clarks NP.  Contact information: [address] [place] [address] [phone]            Contact information for after-discharge care    Destination    [facility] Follow up.   Specialty:  Skilled Nursing Facility Contact information: [address] [place] [address] [phone]                 The patient has been discharged on:   1.Beta Blocker:  Yes [ x  ]                              No   [   ]                              If No, reason:  2.Ace Inhibitor/ARB: Yes [   ]  No  [  x  ]                                     If No, reason:  3.Statin:   Yes [ x  ]                  No  [   ]                  If No, reason:  4.Ecasa:  Yes  [ x  ]                  No   [   ]                  If No, reason:   Signed: Sharlene Dory 10/20/2016, 10:12 AM

## 2016-10-12 NOTE — Progress Notes (Signed)
      [address]       [place] [address]             [phone]      Awaiting bed on 2 west step down  BP 116/65   Pulse 68   Temp 97.8 F (36.6 C) (Oral)   Resp (!) 25   Ht  (1.626 m)   Wt 168 lb 3.4 oz (76.3 kg)   SpO2 90%   BMI 28.87 kg/m    Intake/Output Summary (Last 24 hours) at 10/12/16 1707 Last data filed at 10/12/16 1300  Gross per 24 hour  Intake              243 ml  Output              350 ml  Net             -107 ml    Continue current care  Steven C. Dorris Fetch, MD [facility] [phone]

## 2016-10-12 NOTE — Discharge Instructions (Signed)
Coronary Artery Bypass Grafting, Care After ° °This sheet gives you information about how to care for yourself after your procedure. Your health care provider may also give you more specific instructions. If you have problems or questions, contact your health care provider. °What can I expect after the procedure? °After the procedure, it is common to have: °· Nausea and a lack of appetite. °· Constipation. °· Weakness and fatigue. °· Depression or irritability. °· Pain or discomfort in your incision areas. °Follow these instructions at home: °Medicines  °· Take over-the-counter and prescription medicines only as told by your health care provider. Do not stop taking medicines or start any new medicines without approval from your health care provider. °· If you were prescribed an antibiotic medicine, take it as told by your health care provider. Do not stop taking the antibiotic even if you start to feel better. °· Do not drive or use heavy machinery while taking prescription pain medicine. °Incision care  °· Follow instructions from your health care provider about how to take care of your incisions. Make sure you: °¨ Wash your hands with soap and water before you change your bandage (dressing). If soap and water are not available, use hand sanitizer. °¨ Change your dressing as told by your health care provider. °¨ Leave stitches (sutures), skin glue, or adhesive strips in place. These skin closures may need to stay in place for 2 weeks or longer. If adhesive strip edges start to loosen and curl up, you may trim the loose edges. Do not remove adhesive strips completely unless your health care provider tells you to do that. °· Keep incision areas clean, dry, and protected. °· Check your incision areas every day for signs of infection. Check for: °¨ More redness, swelling, or pain. °¨ More fluid or blood. °¨ Warmth. °¨ Pus or a bad smell. °· If incisions were made in your legs: °¨ Avoid crossing your legs. °¨ Avoid  sitting for long periods of time. Change positions every 30 minutes. °¨ Raise (elevate) your legs when you are sitting. °Bathing  °· Do not take baths, swim, or use a hot tub until your health care provider approves. °· Only take sponge baths. Pat the incisions dry. Do not rub incisions with a washcloth or towel. °· Ask your health care provider when you can shower. °Eating and drinking  °· Eat foods that are high in fiber, such as raw fruits and vegetables, whole grains, beans, and nuts. Meats should be lean cut. Avoid canned, processed, and fried foods. This can help prevent constipation and is a recommended part of a heart-healthy diet. °· Drink enough fluid to keep your urine clear or pale yellow. °· Limit alcohol intake to no more than 1 drink a day for nonpregnant women and 2 drinks a day for men. One drink equals 12 oz of beer, 5 oz of wine, or 1½ oz of hard liquor. °Activity  °· Rest and limit your activity as told by your health care provider. You may be instructed to: °¨ Stop any activity right away if you have chest pain, shortness of breath, irregular heartbeats, or dizziness. Get help right away if you have any of these symptoms. °¨ Move around frequently for short periods or take short walks as directed by your health care provider. Gradually increase your activities. You may need physical therapy or cardiac rehabilitation to help strengthen your muscles and build your endurance. °¨ Avoid lifting, pushing, or pulling anything that is heavier than 10   lb (4.5 kg) for at least 6 weeks or as told by your health care provider. °· Do not drive until your health care provider approves. °· Ask your health care provider when you may return to work. °· Ask your health care provider when you may resume sexual activity. °General instructions  °· Do not use any products that contain nicotine or tobacco, such as cigarettes and e-cigarettes. If you need help quitting, ask your health care provider. °· Take 2-3 deep  breaths every few hours during the day, while you recover. This helps expand your lungs and prevent complications like pneumonia after surgery. °· If you were given a device called an incentive spirometer, use it several times a day to practice deep breathing. Support your chest with a pillow or your arms when you take deep breaths or cough. °· Wear compression stockings as told by your health care provider. These stockings help to prevent blood clots and reduce swelling in your legs. °· Weigh yourself every day. This helps identify if your body is holding (retaining) fluid that may make your heart and lungs work harder. °· Keep all follow-up visits as told by your health care provider. This is important. °Contact a health care provider if: °· You have more redness, swelling, or pain around any incision. °· You have more fluid or blood coming from any incision. °· Any incision feels warm to the touch. °· You have pus or a bad smell coming from any incision °· You have a fever. °· You have swelling in your ankles or legs. °· You have pain in your legs. °· You gain 2 lb (0.9 kg) or more a day. °· You are nauseous or you vomit. °· You have diarrhea. °Get help right away if: °· You have chest pain that spreads to your jaw or arms. °· You are short of breath. °· You have a fast or irregular heartbeat. °· You notice a "clicking" in your breastbone (sternum) when you move. °· You have numbness or weakness in your arms or legs. °· You feel dizzy or light-headed. °Summary °· After the procedure, it is common to have pain or discomfort in the incision areas. °· Do not take baths, swim, or use a hot tub until your health care provider approves. °· Gradually increase your activities. You may need physical therapy or cardiac rehabilitation to help strengthen your muscles and build your endurance. °· Weigh yourself every day. This helps identify if your body is holding (retaining) fluid that may make your heart and lungs work  harder. °This information is not intended to replace advice given to you by your health care provider. Make sure you discuss any questions you have with your health care provider. °Document Released: 12/31/2004 Document Revised: 05/02/2016 Document Reviewed: 05/02/2016 °Elsevier Interactive Patient Education © 2017 [employer] ° °

## 2016-10-13 LAB — BASIC METABOLIC PANEL
Anion gap: 9 (ref 5–15)
BUN: 26 mg/dL — AB (ref 6–20)
CHLORIDE: 102 mmol/L (ref 101–111)
CO2: 22 mmol/L (ref 22–32)
CREATININE: 1.3 mg/dL — AB (ref 0.61–1.24)
Calcium: 7.8 mg/dL — ABNORMAL LOW (ref 8.9–10.3)
GFR calc Af Amer: 56 mL/min — ABNORMAL LOW (ref >60)
GFR calc non Af Amer: 49 mL/min — ABNORMAL LOW (ref >60)
GLUCOSE: 101 mg/dL — AB (ref 65–99)
Potassium: 4.1 mmol/L (ref 3.5–5.1)
SODIUM: 133 mmol/L — AB (ref 135–145)

## 2016-10-13 LAB — COMPREHENSIVE METABOLIC PANEL
ALT: 41 U/L (ref 17–63)
AST: 59 U/L — ABNORMAL HIGH (ref 15–41)
Albumin: 2.4 g/dL — ABNORMAL LOW (ref 3.5–5.0)
Alkaline Phosphatase: 66 U/L (ref 38–126)
Anion gap: 9 (ref 5–15)
BUN: 27 mg/dL — ABNORMAL HIGH (ref 6–20)
CO2: 21 mmol/L — ABNORMAL LOW (ref 22–32)
Calcium: 7.6 mg/dL — ABNORMAL LOW (ref 8.9–10.3)
Chloride: 101 mmol/L (ref 101–111)
Creatinine, Ser: 1.36 mg/dL — ABNORMAL HIGH (ref 0.61–1.24)
GFR calc Af Amer: 53 mL/min — ABNORMAL LOW (ref >60)
GFR calc non Af Amer: 46 mL/min — ABNORMAL LOW (ref >60)
Glucose, Bld: 87 mg/dL (ref 65–99)
Potassium: 4.1 mmol/L (ref 3.5–5.1)
Sodium: 131 mmol/L — ABNORMAL LOW (ref 135–145)
Total Bilirubin: 3.8 mg/dL — ABNORMAL HIGH (ref 0.3–1.2)
Total Protein: 5 g/dL — ABNORMAL LOW (ref 6.5–8.1)

## 2016-10-13 LAB — CBC
HCT: 25.6 % — ABNORMAL LOW (ref 39.0–52.0)
Hemoglobin: 8.5 g/dL — ABNORMAL LOW (ref 13.0–17.0)
MCH: 29.6 pg (ref 26.0–34.0)
MCHC: 33.2 g/dL (ref 30.0–36.0)
MCV: 89.2 fL (ref 78.0–100.0)
Platelets: 256 10*3/uL (ref 150–400)
RBC: 2.87 MIL/uL — ABNORMAL LOW (ref 4.22–5.81)
RDW: 14.4 % (ref 11.5–15.5)
WBC: 10.6 10*3/uL — ABNORMAL HIGH (ref 4.0–10.5)

## 2016-10-13 LAB — GLUCOSE, CAPILLARY: GLUCOSE-CAPILLARY: 81 mg/dL (ref 65–99)

## 2016-10-13 MED ORDER — FERROUS GLUCONATE 324 (38 FE) MG PO TABS
324.0000 mg | ORAL_TABLET | Freq: Two times a day (BID) | ORAL | Status: DC
Start: 2016-10-13 — End: 2016-10-14
  Administered 2016-10-13: 324 mg via ORAL
  Filled 2016-10-13: qty 1

## 2016-10-13 MED ORDER — PANTOPRAZOLE SODIUM 40 MG PO TBEC: 40.0000 mg | DELAYED_RELEASE_TABLET | Freq: Every day | ORAL | Status: DC

## 2016-10-13 MED ORDER — SODIUM CHLORIDE 0.9% FLUSH: 3.0000 mL | INTRAVENOUS | Status: DC | PRN

## 2016-10-13 MED ORDER — ONDANSETRON HCL 4 MG/2ML IJ SOLN: 4.0000 mg | Freq: Four times a day (QID) | INTRAMUSCULAR | Status: DC | PRN

## 2016-10-13 MED ORDER — BISACODYL 10 MG RE SUPP: 10.0000 mg | Freq: Every day | RECTAL | Status: DC | PRN

## 2016-10-13 MED ORDER — CEPHALEXIN 500 MG PO CAPS
500.0000 mg | ORAL_CAPSULE | Freq: Three times a day (TID) | ORAL | Status: DC
  Administered 2016-10-13 – 2016-10-20 (×21): 500 mg via ORAL
  Filled 2016-10-13 (×23): qty 1

## 2016-10-13 MED ORDER — ACETAMINOPHEN 325 MG PO TABS
650.0000 mg | ORAL_TABLET | Freq: Four times a day (QID) | ORAL | Status: DC | PRN
  Administered 2016-10-13 – 2016-10-19 (×2): 650 mg via ORAL
  Filled 2016-10-13 (×3): qty 2

## 2016-10-13 MED ORDER — DOCUSATE SODIUM 100 MG PO CAPS: 200.0000 mg | ORAL_CAPSULE | Freq: Every day | ORAL | Status: DC

## 2016-10-13 MED ORDER — SODIUM CHLORIDE 0.9 % IV SOLN: 250.0000 mL | INTRAVENOUS | Status: DC | PRN

## 2016-10-13 MED ORDER — FUROSEMIDE 40 MG PO TABS
40.0000 mg | ORAL_TABLET | Freq: Every day | ORAL | Status: DC
  Administered 2016-10-14 – 2016-10-18 (×5): 40 mg via ORAL
  Filled 2016-10-13 (×5): qty 1

## 2016-10-13 MED ORDER — MOVING RIGHT ALONG BOOK
Freq: Once | Status: AC
  Administered 2016-10-13: 17:00:00
  Filled 2016-10-13: qty 1

## 2016-10-13 MED ORDER — METOPROLOL TARTRATE 12.5 MG HALF TABLET
12.5000 mg | ORAL_TABLET | Freq: Two times a day (BID) | ORAL | Status: DC
  Administered 2016-10-13 – 2016-10-19 (×13): 12.5 mg via ORAL
  Filled 2016-10-13 (×14): qty 1

## 2016-10-13 MED ORDER — ASPIRIN EC 325 MG PO TBEC
325.0000 mg | DELAYED_RELEASE_TABLET | Freq: Every day | ORAL | Status: DC
  Administered 2016-10-14 – 2016-10-20 (×7): 325 mg via ORAL
  Filled 2016-10-13 (×7): qty 1

## 2016-10-13 MED ORDER — ONDANSETRON HCL 4 MG PO TABS: 4.0000 mg | ORAL_TABLET | Freq: Four times a day (QID) | ORAL | Status: DC | PRN

## 2016-10-13 MED ORDER — POTASSIUM CHLORIDE CRYS ER 20 MEQ PO TBCR
20.0000 meq | EXTENDED_RELEASE_TABLET | Freq: Every day | ORAL | Status: DC
  Administered 2016-10-14 – 2016-10-18 (×5): 20 meq via ORAL
  Filled 2016-10-13 (×5): qty 1

## 2016-10-13 MED ORDER — SODIUM CHLORIDE 0.9% FLUSH
3.0000 mL | Freq: Two times a day (BID) | INTRAVENOUS | Status: DC
  Administered 2016-10-13 – 2016-10-19 (×11): 3 mL via INTRAVENOUS

## 2016-10-13 MED ORDER — BISACODYL 5 MG PO TBEC: 10.0000 mg | DELAYED_RELEASE_TABLET | Freq: Every day | ORAL | Status: DC | PRN

## 2016-10-13 NOTE — Progress Notes (Signed)
10/13/2016 1450 Received transfer from [facility] to room 2W20.  Tele monitor applied and CCMD notified.  Oriented to room, call light and bed.  Call light and family at bedside. Kathryne Hitch

## 2016-10-13 NOTE — Progress Notes (Addendum)
6 Days Post-Op Procedure(s) (LRB): CORONARY ARTERY BYPASS GRAFTING (CABG) x 3 , using internal mammary, and right greater saphenous vein harvested endoscopically SVG-PD, SVG-OM, LIMA-LAD (N/A) TRANSESOPHAGEAL ECHOCARDIOGRAM (TEE) (N/A) Subjective: Doing well, ambulating nsr Leg incisions with erythema, early cellulitis- start po keflex creat cont to improve Objective: Vital signs in last 24 hours: Temp:  [97.7 F (36.5 C)-98.1 F (36.7 C)] 97.8 F (36.6 C) (04/19 1147) Pulse Rate:  [56-85] 70 (04/19 1400) Cardiac Rhythm: Normal sinus rhythm (04/19 1200) Resp:  [7-30] 23 (04/19 1400) BP: (97-160)/(52-100) 127/58 (04/19 1400) SpO2:  [90 %-98 %] 97 % (04/19 1400) Weight:  [167 lb 15.9 oz (76.2 kg)] 167 lb 15.9 oz (76.2 kg) (04/19 0530)  Hemodynamic parameters for last 24 hours:   stable Intake/Output from previous day: 04/18 0701 - 04/19 0700 In: 483 [P.O.:480; I.V.:3] Out: 800 [Urine:800] Intake/Output this shift: Total I/O In: 240 [P.O.:240] Out: 400 [Urine:400]       Exam    General- alert and comfortable   Lungs- clear without rales, wheezes   Cor- regular rate and rhythm, no murmur , gallop   Abdomen- soft, non-tender   Extremities - warm, non-tender, minimal edema   Neuro- oriented, appropriate, no focal weakness   Lab Results:  Recent Labs  10/12/16 0409 10/13/16 0347  WBC 9.9 10.6*  HGB 8.2* 8.5*  HCT 24.2* 25.6*  PLT 200 256   BMET:  Recent Labs  10/12/16 0409 10/13/16 0347  NA 132* 131*  K 3.8 4.1  CL 101 101  CO2 23 21*  GLUCOSE 76 87  BUN 25* 27*  CREATININE 1.51* 1.36*  CALCIUM 7.5* 7.6*    PT/INR: No results for input(s): LABPROT, INR in the last 72 hours. ABG    Component Value Date/Time   PHART 7.417 10/08/2016 0021   HCO3 21.3 10/08/2016 0021   TCO2 21 10/08/2016 1740   ACIDBASEDEF 3.0 (H) 10/08/2016 0021   O2SAT 97.0 10/08/2016 0021   CBG (last 3)   Recent Labs  10/12/16 1551 10/12/16 2136 10/13/16 0740  GLUCAP 99 86  81    Assessment/Plan: S/P Procedure(s) (LRB): CORONARY ARTERY BYPASS GRAFTING (CABG) x 3 , using internal mammary, and right greater saphenous vein harvested endoscopically SVG-PD, SVG-OM, LIMA-LAD (N/A) TRANSESOPHAGEAL ECHOCARDIOGRAM (TEE) (N/A) Mobilize Plan for transfer to step-down: see transfer orders  PT rec SNF at DC- will get social work consult Check CBC in am  LOS: 12 days    Jason [name] 10/13/2016

## 2016-10-13 NOTE — Progress Notes (Signed)
CSW provided bed offers to pt and pt wife at bedside  1st preference is [facility] 2nd preference would be [facility] or [facility]  According to wife patient likely to DC over the weekend- CSW confirmed with [facility] that they should have availability if pt is stable for weekend DC  CSW will continue to follow  Burna Sis, LCSW Clinical Social Worker [phone]

## 2016-10-14 LAB — CBC
HCT: 26.3 % — ABNORMAL LOW (ref 39.0–52.0)
Hemoglobin: 8.8 g/dL — ABNORMAL LOW (ref 13.0–17.0)
MCH: 29.5 pg (ref 26.0–34.0)
MCHC: 33.5 g/dL (ref 30.0–36.0)
MCV: 88.3 fL (ref 78.0–100.0)
Platelets: 286 10*3/uL (ref 150–400)
RBC: 2.98 MIL/uL — ABNORMAL LOW (ref 4.22–5.81)
RDW: 14.5 % (ref 11.5–15.5)
WBC: 8.6 10*3/uL (ref 4.0–10.5)

## 2016-10-14 MED ORDER — LACTULOSE 10 GM/15ML PO SOLN
20.0000 g | Freq: Every day | ORAL | Status: DC | PRN
  Administered 2016-10-14 – 2016-10-17 (×2): 20 g via ORAL
  Filled 2016-10-14 (×2): qty 30

## 2016-10-14 MED ORDER — FLEET ENEMA 7-19 GM/118ML RE ENEM
1.0000 | ENEMA | Freq: Once | RECTAL | Status: DC
  Filled 2016-10-14: qty 1

## 2016-10-14 MED ORDER — POLYETHYLENE GLYCOL 3350 17 G PO PACK
17.0000 g | PACK | Freq: Every day | ORAL | Status: DC
  Administered 2016-10-14 – 2016-10-17 (×4): 17 g via ORAL
  Filled 2016-10-14 (×5): qty 1

## 2016-10-14 NOTE — Progress Notes (Signed)
CARDIAC REHAB PHASE I   PRE:  Rate/Rhythm: 67 SR    BP: sitting 122/62    SaO2: 97 1 1/2L, 95 RA  MODE:  Ambulation: 250 ft   POST:  Rate/Rhythm: 82 SR    BP: sitting 126/63     SaO2: 91 RA  Pt reluctant to walk due to constipation. Needed mod assist x2 to get out of bed with verbal cues for sternal precautions. Stood and ambulated with RW, steady, slow. Pt c/o abdominal discomfort/pain, eager to have BM. Had to encouraged pt to go farther. He needs to walk more often. To recliner. Left off O2. SaO2 93 RA in recliner at rest. Ed completed with pt and wife. Voiced understanding and requests his referral be sent to [facility]. Gave video to watch over weekend. Can be x1.  1250-1313, 1420-1503   [name] CES, ACSM 10/14/2016 2:59 PM

## 2016-10-14 NOTE — Care Management Important Message (Signed)
Important Message  Patient Details  Name: Jason [name] MRN: [id number] Date of Birth: 01/18/1930   Medicare Important Message Given:  Yes    [name] 10/14/2016, 2:27 PM

## 2016-10-14 NOTE — Progress Notes (Addendum)
      [address]       [place] [address]             [phone]      7 Days Post-Op Procedure(s) (LRB): CORONARY ARTERY BYPASS GRAFTING (CABG) x 3 , using internal mammary, and right greater saphenous vein harvested endoscopically SVG-PD, SVG-OM, LIMA-LAD (N/A) TRANSESOPHAGEAL ECHOCARDIOGRAM (TEE) (N/A) Subjective: Worried that he has not had a bowel movement since Tuesday. He has had issues with constipation in the past.  Objective: Vital signs in last 24 hours: Temp:  [97.5 F (36.4 C)-98.1 F (36.7 C)] 98.1 F (36.7 C) (04/20 0621) Pulse Rate:  [64-85] 71 (04/20 0621) Cardiac Rhythm: Normal sinus rhythm (04/19 1905) Resp:  [15-25] 18 (04/20 0621) BP: (106-141)/(52-100) 127/59 (04/20 0621) SpO2:  [90 %-99 %] 95 % (04/20 0621) Weight:  [74.9 kg (165 lb 1.6 oz)] 74.9 kg (165 lb 1.6 oz) (04/20 0621)     Intake/Output from previous day: 04/19 0701 - 04/20 0700 In: 630 [P.O.:630] Out: 750 [Urine:750] Intake/Output this shift: No intake/output data recorded.  General appearance: alert, cooperative, no distress and hard of hearing Heart: regular rate and rhythm, S1, S2 normal, no murmur, click, rub or gallop Lungs: clear to auscultation bilaterally Abdomen: soft, non-tender; bowel sounds normal; no masses,  no organomegaly Extremities: pedal edema R > L Wound: sternal incision dry and intact. EVH site with localized erythema. No drainage  Lab Results:  Recent Labs  10/13/16 0347 10/14/16 0311  WBC 10.6* 8.6  HGB 8.5* 8.8*  HCT 25.6* 26.3*  PLT 256 286   BMET:  Recent Labs  10/13/16 0347 10/13/16 1640  NA 131* 133*  K 4.1 4.1  CL 101 102  CO2 21* 22  GLUCOSE 87 101*  BUN 27* 26*  CREATININE 1.36* 1.30*  CALCIUM 7.6* 7.8*    PT/INR: No results for input(s): LABPROT, INR in the last 72 hours. ABG    Component Value Date/Time   PHART 7.417 10/08/2016 0021   HCO3 21.3 10/08/2016 0021   TCO2 21 10/08/2016 1740   ACIDBASEDEF 3.0 (H)  10/08/2016 0021   O2SAT 97.0 10/08/2016 0021   CBG (last 3)   Recent Labs  10/12/16 1551 10/12/16 2136 10/13/16 0740  GLUCAP 99 86 81    Assessment/Plan: S/P Procedure(s) (LRB): CORONARY ARTERY BYPASS GRAFTING (CABG) x 3 , using internal mammary, and right greater saphenous vein harvested endoscopically SVG-PD, SVG-OM, LIMA-LAD (N/A) TRANSESOPHAGEAL ECHOCARDIOGRAM (TEE) (N/A)  CV-NSR in the 70s, BP well controlled, EPW discontinue. Continue lopressor, lipitor Pulm-tolerating 1L [place], continue pulmonary toilet Renal-creatinine trending down, remains volume overload. Continue Lasix  PO GI-post-op ileus, + BM Tuesday. On multiple agents.  H and H stable Endo-blood glucose level well controlled Leg incision cellulitis-start PO keflex, wife is asking if a probiotic can be added.  Ultram for pain. Holding narcotics due to confusion.   Plan: discharge to [facility] over the weekend if he remains clinically stable.      LOS: 13 days    Sharlene Dory 10/14/2016 Patient seen and examined, agree with above He is complaining of constipation- dc iron. Give Fleet enema today  Salvatore Decent. Dorris Fetch, MD [facility] [phone]

## 2016-10-14 NOTE — Progress Notes (Signed)
10/14/2016 10:36 AM EPW D/C'd per order and per protocol.  Ends intact. Pt. Tolerated well.  Advised bedrest x1hr.  Call bell in reach.  Vital signs collected per protocol. Kathryne Hitch

## 2016-10-15 NOTE — Progress Notes (Addendum)
[address]       [place] [address]             [phone]      8 Days Post-Op Procedure(s) (LRB): CORONARY ARTERY BYPASS GRAFTING (CABG) x 3 , using internal mammary, and right greater saphenous vein harvested endoscopically SVG-PD, SVG-OM, LIMA-LAD (N/A) TRANSESOPHAGEAL ECHOCARDIOGRAM (TEE) (N/A) Subjective: Feels better, + BM yesterday  Objective: Vital signs in last 24 hours: Temp:  [98 F (36.7 C)-98.6 F (37 C)] 98.6 F (37 C) (04/21 0606) Pulse Rate:  [68-78] 76 (04/21 0606) Cardiac Rhythm: Normal sinus rhythm (04/20 1901) Resp:  [16-18] 18 (04/21 0606) BP: (106-129)/(49-70) 119/58 (04/21 0606) SpO2:  [95 %-96 %] 96 % (04/21 0606) Weight:  [162 lb 3.2 oz (73.6 kg)] 162 lb 3.2 oz (73.6 kg) (04/21 9147)  Hemodynamic parameters for last 24 hours:    Intake/Output from previous day: 04/20 0701 - 04/21 0700 In: 243 [P.O.:240; I.V.:3] Out: 125 [Urine:125] Intake/Output this shift: No intake/output data recorded.  General appearance: alert, cooperative and no distress Heart: regular rate and rhythm Lungs: clear to auscultation bilaterally Abdomen: + BS, soft, nontender Extremities: + edema Wound: incis healing well  Lab Results:  Recent Labs  10/13/16 0347 10/14/16 0311  WBC 10.6* 8.6  HGB 8.5* 8.8*  HCT 25.6* 26.3*  PLT 256 286   BMET:  Recent Labs  10/13/16 0347 10/13/16 1640  NA 131* 133*  K 4.1 4.1  CL 101 102  CO2 21* 22  GLUCOSE 87 101*  BUN 27* 26*  CREATININE 1.36* 1.30*  CALCIUM 7.6* 7.8*    PT/INR: No results for input(s): LABPROT, INR in the last 72 hours. ABG    Component Value Date/Time   PHART 7.417 10/08/2016 0021   HCO3 21.3 10/08/2016 0021   TCO2 21 10/08/2016 1740   ACIDBASEDEF 3.0 (H) 10/08/2016 0021   O2SAT 97.0 10/08/2016 0021   CBG (last 3)   Recent Labs  10/12/16 1551 10/12/16 2136 10/13/16 0740  GLUCAP 99 86 81    Meds Scheduled Meds: . aspirin EC  325 mg Oral Daily  .  atorvastatin  80 mg Oral q1800  . bisacodyl  10 mg Oral Daily   Or  . bisacodyl  10 mg Rectal Daily  . cephALEXin  500 mg Oral Q8H  . docusate sodium  200 mg Oral Daily  . furosemide  40 mg Oral Daily  . mouth rinse  15 mL Mouth Rinse BID  . metoprolol tartrate  12.5 mg Oral BID  . pantoprazole  40 mg Oral Daily  . polyethylene glycol  17 g Oral Daily  . potassium chloride  20 mEq Oral Daily  . sodium chloride flush  3 mL Intravenous Q12H  . sodium chloride flush  3 mL Intravenous Q12H  . sodium phosphate  1 enema Rectal Once   Continuous Infusions: . sodium chloride Stopped (10/08/16 0900)  . sodium chloride    . sodium chloride 10 mL/hr at 10/09/16 0614  . sodium chloride    . lactated ringers    . lactated ringers    . lactated ringers Stopped (10/09/16 0530)   PRN Meds:.sodium chloride, sodium chloride, acetaminophen, bisacodyl **OR** bisacodyl, lactated ringers, lactulose, metoprolol, ondansetron **OR** ondansetron (ZOFRAN) IV, sodium chloride flush, sodium chloride flush, traMADol  Xrays No results found.  Assessment/Plan: S/P Procedure(s) (LRB): CORONARY ARTERY BYPASS GRAFTING (CABG) x 3 , using internal mammary, and right greater saphenous vein harvested endoscopically SVG-PD, SVG-OM,  LIMA-LAD (N/A) TRANSESOPHAGEAL ECHOCARDIOGRAM (TEE) (N/A)  1 conts steady improvement 2 constipation , chronic- stable with + BM, benign abdomen 3 conts diuresis for volume overload 4 sinus rhythm, hemodyn stable. BP too low for ace/arb- also some real insuff 5 incis minimal erethema on keflex 6 SNF soon    LOS: 14 days    GOLD,WAYNE E 10/15/2016  very frail- needs SNF patient examined and medical record reviewed,agree with above note. Kathlee Nations Trigt III 10/15/2016

## 2016-10-15 NOTE — Progress Notes (Signed)
CARDIAC REHAB PHASE I   PRE:  Rate/Rhythm: 76 SR  BP:  Supine: 114/56  Sitting:   Standing:    SaO2: 91%RA  MODE:  Ambulation: 350 ft   POST:  Rate/Rhythm: 98 SR  BP:  Supine:   Sitting: 130/64  Standing:    SaO2: 93%RA 0830-0858 Pt walked 350 ft on RA with gait belt use, rolling walker and asst x1. Gait fairly steady. Some DOE noted but sats good on RA. Did not want to go to recliner at this time so back to bed. Tolerated well.   Luetta Nutting, RN BSN  10/15/2016 8:55 AM

## 2016-10-16 MED ORDER — ENOXAPARIN SODIUM 40 MG/0.4ML ~~LOC~~ SOLN
40.0000 mg | SUBCUTANEOUS | Status: DC
  Administered 2016-10-16 – 2016-10-20 (×5): 40 mg via SUBCUTANEOUS
  Filled 2016-10-16 (×5): qty 0.4

## 2016-10-16 MED ORDER — ATORVASTATIN CALCIUM 10 MG PO TABS
10.0000 mg | ORAL_TABLET | Freq: Every day | ORAL | Status: DC
  Administered 2016-10-16 – 2016-10-19 (×4): 10 mg via ORAL
  Filled 2016-10-16 (×4): qty 1

## 2016-10-16 NOTE — Progress Notes (Signed)
Called into pt room for complaint of lightheadedness. Pt family states they saw the HR monitor go up to 140. Called Natalie at [facility], she states she doesn't see any alarms. Pt HR NSR 70s. O2 level 100% on room air. BP 122/53. Call bell within reach, will continue to monitor.   Storm Frisk [place]

## 2016-10-16 NOTE — Progress Notes (Addendum)
[name]       [place] [address]             [phone]      9 Days Post-Op Procedure(s) (LRB): CORONARY ARTERY BYPASS GRAFTING (CABG) x 3 , using internal mammary, and right greater saphenous vein harvested endoscopically SVG-PD, SVG-OM, LIMA-LAD (N/A) TRANSESOPHAGEAL ECHOCARDIOGRAM (TEE) (N/A) Subjective: Feeling poorly in general today, no specific c/o except coughing more, non-productive  Objective: Vital signs in last 24 hours: Temp:  [98.2 F (36.8 C)-99.6 F (37.6 C)] 98.3 F (36.8 C) (04/22 0404) Pulse Rate:  [73-76] 73 (04/22 0404) Cardiac Rhythm: Normal sinus rhythm (04/21 2000) Resp:  [18] 18 (04/22 0404) BP: (110-119)/(48-64) 119/62 (04/22 0404) SpO2:  [90 %-98 %] 90 % (04/22 0404) Weight:  [160 lb 1.6 oz (72.6 kg)] 160 lb 1.6 oz (72.6 kg) (04/22 0404)  Hemodynamic parameters for last 24 hours:    Intake/Output from previous day: 04/21 0701 - 04/22 0700 In: 500 [P.O.:500] Out: 1200 [Urine:1200] Intake/Output this shift: No intake/output data recorded.  General appearance: alert, cooperative and no distress Heart: regular rate and rhythm and soft systolic murmur Lungs: min dim in bases Abdomen: benign Extremities: pitting edema slightly improved Wound: incis healing well without erethema  Lab Results:  Recent Labs  10/14/16 0311  WBC 8.6  HGB 8.8*  HCT 26.3*  PLT 286   BMET:  Recent Labs  10/13/16 1640  NA 133*  K 4.1  CL 102  CO2 22  GLUCOSE 101*  BUN 26*  CREATININE 1.30*  CALCIUM 7.8*    PT/INR: No results for input(s): LABPROT, INR in the last 72 hours. ABG    Component Value Date/Time   PHART 7.417 10/08/2016 0021   HCO3 21.3 10/08/2016 0021   TCO2 21 10/08/2016 1740   ACIDBASEDEF 3.0 (H) 10/08/2016 0021   O2SAT 97.0 10/08/2016 0021   CBG (last 3)   Recent Labs  10/13/16 0740  GLUCAP 81    Meds Scheduled Meds: . aspirin EC  325 mg Oral Daily  . atorvastatin  80 mg Oral q1800  .  bisacodyl  10 mg Oral Daily   Or  . bisacodyl  10 mg Rectal Daily  . cephALEXin  500 mg Oral Q8H  . docusate sodium  200 mg Oral Daily  . furosemide  40 mg Oral Daily  . mouth rinse  15 mL Mouth Rinse BID  . metoprolol tartrate  12.5 mg Oral BID  . pantoprazole  40 mg Oral Daily  . polyethylene glycol  17 g Oral Daily  . potassium chloride  20 mEq Oral Daily  . sodium chloride flush  3 mL Intravenous Q12H  . sodium chloride flush  3 mL Intravenous Q12H  . sodium phosphate  1 enema Rectal Once   Continuous Infusions: . sodium chloride Stopped (10/08/16 0900)  . sodium chloride    . sodium chloride 10 mL/hr at 10/09/16 0614  . sodium chloride    . lactated ringers    . lactated ringers    . lactated ringers Stopped (10/09/16 0530)   PRN Meds:.sodium chloride, sodium chloride, acetaminophen, bisacodyl **OR** bisacodyl, lactated ringers, lactulose, metoprolol, ondansetron **OR** ondansetron (ZOFRAN) IV, sodium chloride flush, sodium chloride flush, traMADol  Xrays No results found.  Assessment/Plan: S/P Procedure(s) (LRB): CORONARY ARTERY BYPASS GRAFTING (CABG) x 3 , using internal mammary, and right greater saphenous vein harvested endoscopically SVG-PD, SVG-OM, LIMA-LAD (N/A) TRANSESOPHAGEAL ECHOCARDIOGRAM (TEE) (N/A)  1 steady, slow progress  2 cont to push rehab/pulm toilet as able 3 hemodyn stable in sinus rhythm on current rx 4 he may be feeling poorly to some degree from high dose statin in an 83 year old probably isn't beneficial- will reduce dose 5 cont diuresis 6 recheck labs in am 7 snf soon  LOS: 15 days    Jason [name],Jason [name] 10/16/2016 Feels poorly with labs satisfactory, incisions clean and dry vital signs stable Agree with plan to reduce statin dose He would benefit from skilled nursing facility

## 2016-10-17 LAB — CBC
HCT: 26.9 % — ABNORMAL LOW (ref 39.0–52.0)
Hemoglobin: 8.7 g/dL — ABNORMAL LOW (ref 13.0–17.0)
MCH: 28.8 pg (ref 26.0–34.0)
MCHC: 32.3 g/dL (ref 30.0–36.0)
MCV: 89.1 fL (ref 78.0–100.0)
PLATELETS: 371 10*3/uL (ref 150–400)
RBC: 3.02 MIL/uL — AB (ref 4.22–5.81)
RDW: 15.1 % (ref 11.5–15.5)
WBC: 8.2 10*3/uL (ref 4.0–10.5)

## 2016-10-17 LAB — BASIC METABOLIC PANEL
Anion gap: 8 (ref 5–15)
BUN: 15 mg/dL (ref 6–20)
CHLORIDE: 102 mmol/L (ref 101–111)
CO2: 25 mmol/L (ref 22–32)
CREATININE: 1.18 mg/dL (ref 0.61–1.24)
Calcium: 8.1 mg/dL — ABNORMAL LOW (ref 8.9–10.3)
GFR calc non Af Amer: 55 mL/min — ABNORMAL LOW (ref >60)
Glucose, Bld: 103 mg/dL — ABNORMAL HIGH (ref 65–99)
POTASSIUM: 4.1 mmol/L (ref 3.5–5.1)
SODIUM: 135 mmol/L (ref 135–145)

## 2016-10-17 MED ORDER — BOOST / RESOURCE BREEZE PO LIQD
237.0000 mL | Freq: Three times a day (TID) | ORAL | Status: DC
  Administered 2016-10-18 – 2016-10-19 (×4): 1 via ORAL

## 2016-10-17 MED ORDER — POLYVINYL ALCOHOL 1.4 % OP SOLN
2.0000 [drp] | OPHTHALMIC | Status: DC | PRN
  Administered 2016-10-17: 2 [drp] via OPHTHALMIC
  Filled 2016-10-17: qty 15

## 2016-10-17 NOTE — Progress Notes (Addendum)
CARDIAC REHAB PHASE I   PRE:  Rate/Rhythm: 74 SR    BP: sitting 123/69    SaO2: 88-90 RA  MODE:  Ambulation: 350 ft   POST:  Rate/Rhythm: 88 SR    BP: sitting 128/60     SaO2: 88-91 RA  Pt moving well, able to get out of bed with mod assist, stand independently and walk with RW independently. SaO2 low at 88-91 RA at rest and walking. Pt c/o feeling woozy/lightheaded when up. Slow pace, rest x3 briefly to increase pursed lip breathing. To recliner. Inspiring 1300 mL on IS.  [id number]   [name] CES, ACSM 10/17/2016 8:55 AM

## 2016-10-17 NOTE — Progress Notes (Signed)
Ambulated 150 feet with RN using front wheel walker on room air tolerated very well.

## 2016-10-17 NOTE — Progress Notes (Addendum)
      [address]       [place] [address]             [phone]      10 Days Post-Op Procedure(s) (LRB): CORONARY ARTERY BYPASS GRAFTING (CABG) x 3 , using internal mammary, and right greater saphenous vein harvested endoscopically SVG-PD, SVG-OM, LIMA-LAD (N/A) TRANSESOPHAGEAL ECHOCARDIOGRAM (TEE) (N/A) Subjective: No issues overnight.   Objective: Vital signs in last 24 hours: Temp:  [97.3 F (36.3 C)-98.5 F (36.9 C)] 97.9 F (36.6 C) (04/23 1428) Pulse Rate:  [68-76] 68 (04/23 1428) Cardiac Rhythm: Normal sinus rhythm (04/23 0700) Resp:  [18] 18 (04/23 1428) BP: (122-129)/(56-64) 125/63 (04/23 1428) SpO2:  [92 %-94 %] 94 % (04/23 1428) Weight:  [158 lb (71.7 kg)] 158 lb (71.7 kg) (04/23 0410)     Intake/Output from previous day: 04/22 0701 - 04/23 0700 In: 720 [P.O.:720] Out: 1400 [Urine:1400] Intake/Output this shift: Total I/O In: 480 [P.O.:480] Out: 1000 [Urine:1000]  General appearance: alert, cooperative and no distress Heart: regular rate and rhythm, S1, S2 normal, no murmur, click, rub or gallop Lungs: clear to auscultation bilaterally Abdomen: soft, non-tender; bowel sounds normal; no masses,  no organomegaly Extremities: 1+ pitting pedal edema Wound: clean and dry  Lab Results:  Recent Labs  10/17/16 0455  WBC 8.2  HGB 8.7*  HCT 26.9*  PLT 371   BMET:   Recent Labs  10/17/16 0455  NA 135  K 4.1  CL 102  CO2 25  GLUCOSE 103*  BUN 15  CREATININE 1.18  CALCIUM 8.1*    PT/INR: No results for input(s): LABPROT, INR in the last 72 hours. ABG    Component Value Date/Time   PHART 7.417 10/08/2016 0021   HCO3 21.3 10/08/2016 0021   TCO2 21 10/08/2016 1740   ACIDBASEDEF 3.0 (H) 10/08/2016 0021   O2SAT 97.0 10/08/2016 0021   CBG (last 3)  No results for input(s): GLUCAP in the last 72 hours.  Assessment/Plan: S/P Procedure(s) (LRB): CORONARY ARTERY BYPASS GRAFTING (CABG) x 3 , using internal mammary, and right  greater saphenous vein harvested endoscopically SVG-PD, SVG-OM, LIMA-LAD (N/A) TRANSESOPHAGEAL ECHOCARDIOGRAM (TEE) (N/A)  1.CV-NSR in the 70s. BP well controlled 2. Pulm-Tolerating room air. No CXR this morning 3. Renal-1.18 creatinine, electrolytes okay. Good urine output. Weight continued to trend down 4. H and H stable, platelets continue to trend up 5. Glucose well controlled 6. Lovenox for DVT proph 7. On an aggressive bowel regimen. Last BM documented 4/20.   Plan: Await placement for skilled nursing facility. Work on increasing oral intake. Ambulate TID. Encourage IS and flutter valve use.   Poor po intake , previous hospitalization  patient ended up with peg tube for nutrition Until ambulating and eating not ready for SNF     LOS: 16 days    [name] 10/17/2016

## 2016-10-18 NOTE — Progress Notes (Addendum)
      [address]       [place] [address]             [phone]      11 Days Post-Op Procedure(s) (LRB): CORONARY ARTERY BYPASS GRAFTING (CABG) x 3 , using internal mammary, and right greater saphenous vein harvested endoscopically SVG-PD, SVG-OM, LIMA-LAD (N/A) TRANSESOPHAGEAL ECHOCARDIOGRAM (TEE) (N/A) Subjective: Had a large bowel movement yesterday and "messed the bed". He was very worried about this. He states that he is trying to eat more.   Objective: Vital signs in last 24 hours: Temp:  [97.9 F (36.6 C)-98.2 F (36.8 C)] 98.2 F (36.8 C) (04/24 0401) Pulse Rate:  [68-72] 71 (04/24 0401) Cardiac Rhythm: Normal sinus rhythm (04/23 1950) Resp:  [18] 18 (04/24 0401) BP: (114-129)/(52-64) 129/64 (04/24 0401) SpO2:  [93 %-95 %] 93 % (04/24 0401) Weight:  [68.9 kg (152 lb)] 68.9 kg (152 lb) (04/24 0401)     Intake/Output from previous day: 04/23 0701 - 04/24 0700 In: 480 [P.O.:480] Out: 1550 [Urine:1550] Intake/Output this shift: No intake/output data recorded.  General appearance: alert, cooperative and no distress Heart: regular rate and rhythm, S1, S2 normal, no murmur, click, rub or gallop Lungs: clear to auscultation bilaterally Abdomen: soft, non-tender; bowel sounds normal; no masses,  no organomegaly Extremities: extremities normal, atraumatic, no cyanosis or edema Wound: clean and dry  Lab Results:  Recent Labs  10/17/16 0455  WBC 8.2  HGB 8.7*  HCT 26.9*  PLT 371   BMET:  Recent Labs  10/17/16 0455  NA 135  K 4.1  CL 102  CO2 25  GLUCOSE 103*  BUN 15  CREATININE 1.18  CALCIUM 8.1*    PT/INR: No results for input(s): LABPROT, INR in the last 72 hours. ABG    Component Value Date/Time   PHART 7.417 10/08/2016 0021   HCO3 21.3 10/08/2016 0021   TCO2 21 10/08/2016 1740   ACIDBASEDEF 3.0 (H) 10/08/2016 0021   O2SAT 97.0 10/08/2016 0021   CBG (last 3)  No results for input(s): GLUCAP in the last 72  hours.  Assessment/Plan: S/P Procedure(s) (LRB): CORONARY ARTERY BYPASS GRAFTING (CABG) x 3 , using internal mammary, and right greater saphenous vein harvested endoscopically SVG-PD, SVG-OM, LIMA-LAD (N/A) TRANSESOPHAGEAL ECHOCARDIOGRAM (TEE) (N/A)  1.CV-NSR in the 70s. BP well controlled 2. Pulm-Tolerating room air. No CXR this morning 3. Renal-1.18 creatinine, electrolytes okay. Good urine output. Weight continued to trend down 4. H and H stable, platelets continue to trend up 5. Glucose well controlled 6. Lovenox for DVT proph 7. On an aggressive bowel regimen. Last BM 4/23.  Plan: Increase ambulation. Increase PO intake. There is a bed available for rehab. The patient seems motivated to make these changes based on his history. Possible discharge tomorrow if improved.    LOS: 17 days    Sharlene Dory 10/18/2016   Eating better this am. Walked 150 feet last pm encourage po diet supplements and ambulation  Poss snf tomorrow I have seen and examined [name] and agree with the above assessment  and plan.  [name] MD Beeper [phone] Office [phone] 10/18/2016 8:31 AM

## 2016-10-18 NOTE — Progress Notes (Signed)
CARDIAC REHAB PHASE I   PRE:  Rate/Rhythm: 77 SR    BP: sitting 117/92    SaO2: 94 RA  MODE:  Ambulation: 350 ft   POST:  Rate/Rhythm: 91 SR    BP: sitting 120/95     SaO2: 93 RA  Pt able to get out of chair and walk with RW without assistance. Rest x1 for one episode of dizziness. Sts he is just tired in general due to not sleeping last night. He is doing well and has been walking 2-3 times a day.  [id number]   [name] CES, ACSM 10/18/2016 11:02 AM

## 2016-10-18 NOTE — Progress Notes (Signed)
Clinical Social Worker met with patients wife. Wife stated that she still would like to send patient to [facility] place and will provide patient with a sitter at night. Patient has had a bed waiting for patient upon discharge. Facility is aware of patients pending discharge. CSW remains available for support and discharge needs.    Rhea Pink, MSW,  [place]

## 2016-10-18 NOTE — Care Management Important Message (Signed)
Important Message  Patient Details  Name: Jason [name] MRN: [id number] Date of Birth: [birth year]   Medicare Important Message Given:  Yes    Idalis Hoelting 10/18/2016, 3:01 PM

## 2016-10-18 NOTE — Progress Notes (Signed)
10/18/2016 1745 Pt ambulated 116ft on RA with rolling walker.  Stopped one time to rest.   Kathryne Hitch

## 2016-10-18 NOTE — Progress Notes (Signed)
Ambulated 300 feet using front wheel walker,room air tolerated well.

## 2016-10-19 MED ORDER — METOPROLOL TARTRATE 25 MG PO TABS: 12.5000 mg | ORAL_TABLET | Freq: Two times a day (BID) | ORAL | 1 refills | Status: DC

## 2016-10-19 MED ORDER — ATORVASTATIN CALCIUM 10 MG PO TABS: 10.0000 mg | ORAL_TABLET | Freq: Every day | ORAL | 1 refills | Status: DC

## 2016-10-19 MED ORDER — BISACODYL 5 MG PO TBEC: 10.0000 mg | DELAYED_RELEASE_TABLET | Freq: Every day | ORAL | 0 refills | Status: DC

## 2016-10-19 MED ORDER — ASPIRIN 325 MG PO TBEC: 325.0000 mg | DELAYED_RELEASE_TABLET | Freq: Every day | ORAL | 0 refills | Status: DC

## 2016-10-19 MED ORDER — BISACODYL 10 MG RE SUPP: 10.0000 mg | Freq: Every day | RECTAL | 0 refills | Status: DC

## 2016-10-19 MED ORDER — POLYETHYLENE GLYCOL 3350 17 G PO PACK: 17.0000 g | PACK | Freq: Every day | ORAL | 0 refills | Status: DC

## 2016-10-19 MED ORDER — PANTOPRAZOLE SODIUM 40 MG PO TBEC: 40.0000 mg | DELAYED_RELEASE_TABLET | Freq: Every day | ORAL | 1 refills | Status: DC

## 2016-10-19 MED ORDER — TRAMADOL HCL 50 MG PO TABS: 50.0000 mg | ORAL_TABLET | Freq: Four times a day (QID) | ORAL | 0 refills | Status: DC | PRN

## 2016-10-19 MED ORDER — CEPHALEXIN 500 MG PO CAPS: 500.0000 mg | ORAL_CAPSULE | Freq: Three times a day (TID) | ORAL | 0 refills | Status: AC

## 2016-10-19 MED ORDER — POLYVINYL ALCOHOL 1.4 % OP SOLN: 2.0000 [drp] | OPHTHALMIC | 0 refills | Status: DC | PRN

## 2016-10-19 MED ORDER — BOOST / RESOURCE BREEZE PO LIQD
237.0000 mL | Freq: Three times a day (TID) | ORAL | 0 refills | Status: DC
Start: 2016-10-19 — End: 2016-10-21

## 2016-10-19 NOTE — Progress Notes (Addendum)
      [address]       [place] [address]             [phone]      12 Days Post-Op Procedure(s) (LRB): CORONARY ARTERY BYPASS GRAFTING (CABG) x 3 , using internal mammary, and right greater saphenous vein harvested endoscopically SVG-PD, SVG-OM, LIMA-LAD (N/A) TRANSESOPHAGEAL ECHOCARDIOGRAM (TEE) (N/A) Subjective: Feeling a little better this morning. He walked 300 ft yesterday. He is eating about half of each meal we give him.   Objective: Vital signs in last 24 hours: Temp:  [97.8 F (36.6 C)-99 F (37.2 C)] 97.8 F (36.6 C) (04/25 0607) Pulse Rate:  [70-78] 70 (04/25 0607) Cardiac Rhythm: Normal sinus rhythm (04/24 1940) Resp:  [17-18] 18 (04/25 0607) BP: (117-136)/(51-92) 136/51 (04/25 0607) SpO2:  [95 %-97 %] 95 % (04/25 0607) Weight:  [67.4 kg (148 lb 9.4 oz)] 67.4 kg (148 lb 9.4 oz) (04/25 0607)  Hemodynamic parameters for last 24 hours:    Intake/Output from previous day: 04/24 0701 - 04/25 0700 In: 293 [P.O.:290; I.V.:3] Out: 1250 [Urine:1250] Intake/Output this shift: No intake/output data recorded.  General appearance: alert, cooperative and no distress Heart: regular rate and rhythm, S1, S2 normal, no murmur, click, rub or gallop Lungs: clear to auscultation bilaterally Abdomen: soft, non-tender; bowel sounds normal; no masses,  no organomegaly Extremities: extremities normal, atraumatic, no cyanosis or edema Wound: clean and dry  Lab Results:  Recent Labs  10/17/16 0455  WBC 8.2  HGB 8.7*  HCT 26.9*  PLT 371   BMET:  Recent Labs  10/17/16 0455  NA 135  K 4.1  CL 102  CO2 25  GLUCOSE 103*  BUN 15  CREATININE 1.18  CALCIUM 8.1*    PT/INR: No results for input(s): LABPROT, INR in the last 72 hours. ABG    Component Value Date/Time   PHART 7.417 10/08/2016 0021   HCO3 21.3 10/08/2016 0021   TCO2 21 10/08/2016 1740   ACIDBASEDEF 3.0 (H) 10/08/2016 0021   O2SAT 97.0 10/08/2016 0021   CBG (last 3)  No results  for input(s): GLUCAP in the last 72 hours.  Assessment/Plan: S/P Procedure(s) (LRB): CORONARY ARTERY BYPASS GRAFTING (CABG) x 3 , using internal mammary, and right greater saphenous vein harvested endoscopically SVG-PD, SVG-OM, LIMA-LAD (N/A) TRANSESOPHAGEAL ECHOCARDIOGRAM (TEE) (N/A)  1.CV-NSR in the 70s. BP well controlled 2. Pulm-Tolerating room air.Encourage IS and flutter valve use 3. Renal-1.18 creatinine, electrolytes okay. Good urine output. Weight continued to trend down 4. H and H stable, platelets continue to trend up 5. Glucose well controlled 6. Lovenox for DVT proph 7. On an aggressive bowel regimen. Last BM 4/23.  Plan: likely discharge today to SNF.     LOS: 18 days    Sharlene Dory 10/19/2016  Feels better, eating and walked over 300 feet off o2 Plan snf today I have seen and examined [name] and agree with the above assessment  and plan.  [name] MD Beeper [phone] Office [phone] 10/19/2016 2:54 PM

## 2016-10-19 NOTE — Progress Notes (Signed)
CSW met with pt, pt's wife, and pt's dtr re d/c plan to [facility]. Pt's wife under impression that [facility] will provide a sitter. CSW explained sitter/PCS services and answered questions. Pt's wife aware that sitter services are private pay.   Pt's dtr to attempt to arrange a sitter with a family member or friend, and pt's wife to arrange sitter services with an agency if family/friend unable to assist. PCS agency list provided to pt's wife. PA in room at time of visit. RNCM updated. Kirsten at [facility] made aware.   Jason [name], MSW, LCSW [phone] (coverage)

## 2016-10-19 NOTE — Progress Notes (Signed)
10/19/2016 1730 Pt walked 317ft on RA with a rolling walker.  Pt tolerated well. Kathryne Hitch

## 2016-10-19 NOTE — Progress Notes (Signed)
CARDIAC REHAB PHASE I   PRE:  Rate/Rhythm: 80 SR  BP:  Sitting: 99/72        SaO2: 94 RA  MODE:  Ambulation: 350 ft   POST:  Rate/Rhythm: 91 SR  BP:  Sitting: 118/83         SaO2: 92 RA  Pt in bed, c/o dizziness but wants to walk. Pt required some assistance from lying to sitting position, verbal reminder cues for sternal precautions. Pt ambulated 350 ft on RA, rolling walker, assist x1, slow, steady gait, tolerated fairly well. Pt c/o ongoing mild dizziness (unchanged from previous), DOE, standing rest x1. Pt to bed per pt request after walk (pt did require assistance), call bell within reach.    [phone] Joylene Grapes, RN, BSN 10/19/2016 11:06 AM

## 2016-10-20 ENCOUNTER — Telehealth (HOSPITAL_COMMUNITY): Payer: Self-pay | Admitting: Internal Medicine

## 2016-10-20 NOTE — Progress Notes (Signed)
      [address]       [place] [address]             [phone]      13 Days Post-Op Procedure(s) (LRB): CORONARY ARTERY BYPASS GRAFTING (CABG) x 3 , using internal mammary, and right greater saphenous vein harvested endoscopically SVG-PD, SVG-OM, LIMA-LAD (N/A) TRANSESOPHAGEAL ECHOCARDIOGRAM (TEE) (N/A) Subjective: In and out of sleep this morning. Slightly confused when discussing discharge plan.  Objective: Vital signs in last 24 hours: Temp:  [97.9 F (36.6 C)-98.7 F (37.1 C)] 98.3 F (36.8 C) (04/26 0428) Pulse Rate:  [76-80] 77 (04/26 0428) Cardiac Rhythm: Normal sinus rhythm (04/26 0700) Resp:  [17-18] 18 (04/26 0428) BP: (98-141)/(54-72) 141/61 (04/26 0428) SpO2:  [93 %-96 %] 93 % (04/26 0428) Weight:  [66.7 kg (147 lb)] 66.7 kg (147 lb) (04/26 0428)     Intake/Output from previous day: 04/25 0701 - 04/26 0700 In: 723 [P.O.:720; I.V.:3] Out: 600 [Urine:600] Intake/Output this shift: No intake/output data recorded.  General appearance: cooperative and no distress Heart: regular rate and rhythm, S1, S2 normal, no murmur, click, rub or gallop Lungs: clear to auscultation bilaterally Abdomen: soft, non-tender; bowel sounds normal; no masses,  no organomegaly Extremities: extremities normal, atraumatic, no cyanosis or edema Wound: clean and dry  Lab Results: No results for input(s): WBC, HGB, HCT, PLT in the last 72 hours. BMET: No results for input(s): NA, K, CL, CO2, GLUCOSE, BUN, CREATININE, CALCIUM in the last 72 hours.  PT/INR: No results for input(s): LABPROT, INR in the last 72 hours. ABG    Component Value Date/Time   PHART 7.417 10/08/2016 0021   HCO3 21.3 10/08/2016 0021   TCO2 21 10/08/2016 1740   ACIDBASEDEF 3.0 (H) 10/08/2016 0021   O2SAT 97.0 10/08/2016 0021   CBG (last 3)  No results for input(s): GLUCAP in the last 72 hours.  Assessment/Plan: S/P Procedure(s) (LRB): CORONARY ARTERY BYPASS GRAFTING (CABG) x 3 , using  internal mammary, and right greater saphenous vein harvested endoscopically SVG-PD, SVG-OM, LIMA-LAD (N/A) TRANSESOPHAGEAL ECHOCARDIOGRAM (TEE) (N/A)  1.CV-NSR in the 70s. BP low at times. Parameters on BB 2. Pulm-Tolerating room air.Encourage IS and flutter valve use 3. Renal-1.18 creatinine, electrolytes okay. Good urine output. Weight continued to trend down. Stopped diuretics yesterday 4. H and H stable, platelets continue to trend up. Last labs 4/23 5. Glucose well controlled 6. Lovenox for DVT proph 7. On an aggressive bowel regimen. Last BM 4/23.  Plan: Patient's family required an extra day to arrange sitter services for nighttime. Patient should be able to go to [facility] place today. Will work again with social work and case management to facilitate.     LOS: 19 days    Sharlene Dory 10/20/2016

## 2016-10-20 NOTE — Progress Notes (Signed)
.  CARDIAC REHAB PHASE I   PRE:  Rate/Rhythm: 84 SR  BP:  Supine:   Sitting: 118/67  Standing: 94/70   SaO2: 97%RA   From chair to bed 1011-1035 Came to see pt to walk. Pt stated he wanted to walk but when he stood, he c/o dizziness. Took BP to see if orthostatic. BP dropped to 94/70. Pt stated he wanted to go to bed as he stated he had been in chair for a few hours. Assisted back to bed with bed alarm activated. Gave pt some fresh water. Notified RN of pt c/o dizziness. Encouraged pt to walk with staff later.     Luetta Nutting, RN BSN  10/20/2016 10:31 AM

## 2016-10-20 NOTE — Telephone Encounter (Signed)
Patient insurance is active and benefits verified. Patient has [place] - no co-payment, deductible $200/$200 has been met, out of pocket $2200/$200 has been met, 0% co-insurance, no pre-authorization and no limit on visit. Passport/reference [phone].  Referral on hold until further notice, patient was discharged to a SNF.

## 2016-10-20 NOTE — Care Management Note (Signed)
Case Management Note Donn Pierini RN, BSN Unit 2W-Case Manager [phone]   Patient Details  Name: Jason [name] MRN: [id number] Date of Birth: [birth year]  Subjective/Objective:   Pt s/p CABG                 Action/Plan: PTA pt lived at home with wife- per PT eval recommendation for SNF- CSW consulted- plan is for [facility]   Expected Discharge Date:  10/20/16               Expected Discharge Plan:  Skilled Nursing Facility  In-House Referral:  Clinical Social Work  Discharge planning Services  CM Consult  Post Acute Care Choice:    Choice offered to:     DME Arranged:    DME Agency:     HH Arranged:    HH Agency:     Status of Service:  Completed, signed off  If discussed at [employer] of Stay Meetings, dates discussed:  4/24, 4/26  Discharge Disposition: skilled facility   Additional Comments:  Darrold Span, RN 10/20/2016, 12:06 PM

## 2016-10-20 NOTE — Progress Notes (Signed)
Clinical Social Worker facilitated patient discharge including contacting patient family and facility to confirm patient discharge plans.  Clinical information faxed to facility and family agreeable with plan.  CSW arranged ambulance transport via [facility] to [facility] .  RN Kathlene November to call [phone] (pt will be in room 1201p) report prior to discharge.  Clinical Social Worker will sign off for now as social work intervention is no longer needed. Please consult [place] again if new need arises.  Marrianne Mood, MSW, [employer] [phone]

## 2016-10-21 ENCOUNTER — Non-Acute Institutional Stay (SKILLED_NURSING_FACILITY): Payer: Medicare Other | Admitting: Internal Medicine

## 2016-10-21 ENCOUNTER — Encounter: Payer: Self-pay | Admitting: Internal Medicine

## 2016-10-21 DIAGNOSIS — I2109 ST elevation (STEMI) myocardial infarction involving other coronary artery of anterior wall: Secondary | ICD-10-CM | POA: Diagnosis not present

## 2016-10-21 DIAGNOSIS — D500 Iron deficiency anemia secondary to blood loss (chronic): Secondary | ICD-10-CM

## 2016-10-21 DIAGNOSIS — F329 Major depressive disorder, single episode, unspecified: Secondary | ICD-10-CM

## 2016-10-21 DIAGNOSIS — G8918 Other acute postprocedural pain: Secondary | ICD-10-CM | POA: Diagnosis not present

## 2016-10-21 DIAGNOSIS — E440 Moderate protein-calorie malnutrition: Secondary | ICD-10-CM | POA: Diagnosis not present

## 2016-10-21 DIAGNOSIS — R531 Weakness: Secondary | ICD-10-CM

## 2016-10-21 DIAGNOSIS — K5901 Slow transit constipation: Secondary | ICD-10-CM | POA: Diagnosis not present

## 2016-10-21 DIAGNOSIS — R0789 Other chest pain: Secondary | ICD-10-CM

## 2016-10-21 DIAGNOSIS — K219 Gastro-esophageal reflux disease without esophagitis: Secondary | ICD-10-CM | POA: Diagnosis not present

## 2016-10-21 DIAGNOSIS — E785 Hyperlipidemia, unspecified: Secondary | ICD-10-CM

## 2016-10-21 DIAGNOSIS — L0390 Cellulitis, unspecified: Secondary | ICD-10-CM | POA: Diagnosis not present

## 2016-10-21 NOTE — Progress Notes (Signed)
LOCATION: [facility]  PCP: Minda Meo, MD   Code Status: DNR  Goals of care: Advanced Directive information Advanced Directives 10/21/2016  Does Patient Have a Medical Advance Directive? Yes  Type of Advance Directive Out of facility DNR (pink MOST or yellow form)  Does patient want to make changes to medical advance directive? No - Patient declined  Copy of Healthcare Power of Attorney in Chart? -  Would patient like information on creating a medical advance directive? -  Pre-existing out of facility DNR order (yellow form or pink MOST form) -       Extended Emergency Contact Information Primary Emergency Contact: Broad,Helen Address: [address]          [place], [place] [address] [place] Home Phone: [phone] Relation: Spouse   Allergies  Allergen Reactions  . Bee Pollen Anaphylaxis  . Tetanus Toxoids Swelling    SWELLING REACTION UNSPECIFIED ? LOCAL REACTION ?  . Hydrocodone Other (See Comments)    Confusion, "talking out of his head"    Chief Complaint  Patient presents with  . New Admit To SNF    New Admission Visit      HPI:  Patient is a 83 y.o. male seen today for short term rehabilitation post hospital admission from 10/01/16-10/20/16 with acute STEMI. Cardiology was consulted and medical management was initiated with aspirin, heparin, b blocker, NTG and statin. He underwent cardiac catheterization on 10/01/16 and then cardiothoracic surgery was consulted. He underwent CABG x3 on 10/07/16. He has medical history of CKD stage 3, hypothyroidism, hyperlipidemia among others. He is seen in his room today with his wife at bedside.   Review of Systems:  Constitutional: Negative for fever, chills, diaphoresis. Low energy level, feels weak and tired. HENT: Negative for headache, congestion,sore throat. Positive for chronic clear nasal discharge and has occasional difficulty swallowing food.   Eyes: Negative for eye pain, blurred vision,  double vision and discharge.  Respiratory: Negative for shortness of breath and wheezing. Positive for cough with non productive phlegm. Cardiovascular: Negative for chest pain, palpitations, leg swelling. Positive for chest wall soreness. Gastrointestinal: Negative for heartburn, nausea, vomiting,melena, diarrhea. Positive for constipation. Has had poor appetite for sometime now. Last bowel movement was 2 days ago.  Genitourinary: Negative for dysuria. Musculoskeletal: Negative for back pain, fall in the facility.  Skin: Negative for itching, rash.  Neurological: Positive for dizziness with change of position. Psychiatric/Behavioral: Negative for insomnia. Positive for depression.    Past Medical History:  Diagnosis Date  . Anxiety    takes Diazepam daily prn  . Arthritis   . Constipation   . Depression    takes Trazodone nightly  . Enlarged prostate   . GERD (gastroesophageal reflux disease)   . H/O hiatal hernia    takes Omeprazole daily  . Hemorrhoids   . History of colitis    many yrs ago  . History of colon polyps   . History of kidney stones    passed on his own  . Hyperlipidemia    was on medication but has been off for a while  . Insomnia    takes Trazodone nightly  . 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 Urinary frequency    Past Surgical History:  Procedure Laterality Date  . bilateral cataract surgery    . CIRCUMCISION    . COLONOSCOPY    . CORONARY ARTERY BYPASS GRAFT N/A 10/07/2016   Procedure: CORONARY ARTERY BYPASS GRAFTING (CABG) x 3 ,  using internal mammary, and right greater saphenous vein harvested endoscopically SVG-PD, SVG-OM, LIMA-LAD;  Surgeon: [name], MD;  Location: [facility] OR;  Service: Open Heart Surgery;  Laterality: N/A;  . DIRECT LARYNGOSCOPY N/A 06/26/2013   Procedure: DIRECT LARYNGOSCOPY;  Surgeon: Christia Reading, MD;  Location: [facility] OR;  Service: ENT;  Laterality: N/A;  . EAR CYST EXCISION N/A 04/05/2013   Procedure: CYST REMOVAL;   Surgeon: Melvenia Beam, MD;  Location: [facility] OR;  Service: ENT;  Laterality: N/A;  . ESOPHAGOGASTRODUODENOSCOPY    . ESOPHAGOGASTRODUODENOSCOPY N/A 07/15/2013   Procedure: ESOPHAGOGASTRODUODENOSCOPY (EGD);  Surgeon: Cherylynn Ridges, MD;  Location: [facility] ENDOSCOPY;  Service: General;  Laterality: N/A;  . HEMORRHOIDECTOMY WITH HEMORRHOID BANDING    . HERNIA REPAIR     double  . LEFT HEART CATH AND CORONARY ANGIOGRAPHY N/A 10/01/2016   Procedure: Left Heart Cath and Coronary Angiography;  Surgeon: Lyn Records, MD;  Location: [facility] INVASIVE CV LAB;  Service: Cardiovascular;  Laterality: N/A;  . PEG PLACEMENT N/A 07/15/2013   Procedure: PERCUTANEOUS ENDOSCOPIC GASTROSTOMY (PEG) PLACEMENT;  Surgeon: Cherylynn Ridges, MD;  Location: [facility] ENDOSCOPY;  Service: General;  Laterality: N/A;  . PERIPHERALLY INSERTED CENTRAL CATHETER INSERTION    . right knee arthroscopy    . SEPTOPLASTY N/A 04/05/2013   Procedure: SEPTOPLASTY;  Surgeon: Melvenia Beam, MD;  Location: [facility] OR;  Service: ENT;  Laterality: N/A;  . SINUS ENDO W/FUSION N/A 07/03/2013   Procedure: ENDOSCOPIC SINUS SURGERY WITH FUSION NAVIGATION with By-Nasopharongeal Biopsy;  Surgeon: Christia Reading, MD;  Location: [facility] OR;  Service: ENT;  Laterality: N/A;  . SINUS EXPLORATION Bilateral 07/11/2013   Procedure:  BEDSIDE  SINUS EXPLORATION ;  Surgeon: Christia Reading, MD;  Location: [facility] OR;  Service: ENT;  Laterality: Bilateral;  . TEE WITHOUT CARDIOVERSION N/A 10/07/2016   Procedure: TRANSESOPHAGEAL ECHOCARDIOGRAM (TEE);  Surgeon: [name], MD;  Location: [facility] OR;  Service: Open Heart Surgery;  Laterality: N/A;  . TONSILLECTOMY     Social History:   reports that he quit smoking about 23 years ago. He quit smokeless tobacco use about 3 years ago. His smokeless tobacco use included Chew. He reports that he does not drink alcohol or use drugs.  Family History  Problem Relation Age of Onset  . CAD Father     Medications: Allergies as of 10/21/2016      Reactions    Bee Pollen Anaphylaxis   Tetanus Toxoids Swelling   SWELLING REACTION UNSPECIFIED ? LOCAL REACTION ?   Hydrocodone Other (See Comments)   Confusion, "talking out of his head"      Medication List       Accurate as of 10/21/16 11:56 AM. Always use your most recent med list.          aspirin 325 MG EC tablet Take 1 tablet (325 mg total) by mouth daily.   atorvastatin 10 MG tablet Commonly known as:  LIPITOR Take 1 tablet (10 mg total) by mouth daily at 6 PM.   bisacodyl 5 MG EC tablet Commonly known as:  DULCOLAX Take 2 tablets (10 mg total) by mouth daily.   bisacodyl 10 MG suppository Commonly known as:  DULCOLAX Place 1 suppository (10 mg total) rectally daily.   cephALEXin 500 MG capsule Commonly known as:  KEFLEX Take 1 capsule (500 mg total) by mouth 3 (three) times daily.   cyclobenzaprine 5 MG tablet Commonly known as:  FLEXERIL Take 1 tablet (5 mg total) by mouth at bedtime.  metoprolol tartrate 25 MG tablet Commonly known as:  LOPRESSOR Take 0.5 tablets (12.5 mg total) by mouth 2 (two) times daily.   pantoprazole 40 MG tablet Commonly known as:  PROTONIX Take 1 tablet (40 mg total) by mouth daily.   polyethylene glycol packet Commonly known as:  MIRALAX / GLYCOLAX Take 17 g by mouth daily.   polyvinyl alcohol 1.4 % ophthalmic solution Commonly known as:  LIQUIFILM TEARS Place 2 drops into both eyes as needed for dry eyes.   traMADol 50 MG tablet Commonly known as:  ULTRAM Take 1 tablet (50 mg total) by mouth every 6 (six) hours as needed for moderate pain.   UNABLE TO FIND Med Name: Med pass 120 mL by mouth 3 times daily between meals       Immunizations: Immunization History  Administered Date(s) Administered  . Influenza,inj,Quad PF,36+ Mos 03/10/2015  . Influenza-Unspecified 03/27/2013  . Pneumococcal-Unspecified 08/07/2006  . Zoster 08/08/2011     Physical Exam: Vitals:   10/21/16 1151  BP: 114/60  Pulse: 90  Resp: 20  Temp:  97.9 F (36.6 C)  TempSrc: Oral  SpO2: 94%  Weight: 147 lb (66.7 kg)  Height:  (1.626 m)   Body mass index is 25.23 kg/m.  General- elderly male, well built, in no acute distress Head- normocephalic, atraumatic Nose- no nasal discharge Throat- moist mucus membrane Eyes- PERRLA, EOMI, no pallor, no icterus, no discharge, normal conjunctiva, normal sclera Neck- no cervical lymphadenopathy Cardiovascular- normal s1,s2, no murmur Respiratory- bilateral clear to auscultation, no wheeze, no rhonchi, no crackles, no use of accessory muscles Abdomen- bowel sounds present, soft, non tender, no guarding or rigidity Musculoskeletal- able to move all 4 extremities, generalized weakness, on wheelchair, arthritis changes to his fingers, no leg edema Neurological- alert and oriented to person, place and time Skin- warm and dry, sternal incision healing well, surgical incision on left chest wall and upper abdominal wall with sutures in place, graft site to right thigh and leg healing well, catheter insertion site to right groin healing well Psychiatry- flat affect    Labs reviewed: Basic Metabolic Panel:  Recent Labs  82/95/62 2000  10/08/16 0410  10/08/16 1715  10/13/16 0347 10/13/16 1640 10/17/16 0455  NA  --   < > 135  --   --   < > 131* 133* 135  K  --   < > 5.3*  --   --   < > 4.1 4.1 4.1  CL  --   < > 105  --   --   < > 101 102 102  CO2  --   --  21*  --   --   < > 21* 22 25  GLUCOSE  --   < > 118*  --   --   < > 87 101* 103*  BUN  --   < > 12  --   --   < > 27* 26* 15  CREATININE 0.98  < > 1.16  < > 1.39*  < > 1.36* 1.30* 1.18  CALCIUM  --   --  7.6*  --   --   < > 7.6* 7.8* 8.1*  MG 2.6*  --  2.6*  --  2.4  --   --   --   --   < > = values in this interval not displayed. Liver Function Tests:  Recent Labs  10/03/16 0159 10/06/16 0334 10/13/16 0347  AST 73* 46* 59*  ALT 25  38 41  ALKPHOS 59 60 66  BILITOT 1.8* 1.5* 3.8*  PROT 5.8* 5.7* 5.0*  ALBUMIN 3.2* 3.1*  2.4*   No results for input(s): LIPASE, AMYLASE in the last 8760 hours. No results for input(s): AMMONIA in the last 8760 hours. CBC:  Recent Labs  10/01/16 1626  10/13/16 0347 10/14/16 0311 10/17/16 0455  WBC 9.3  < > 10.6* 8.6 8.2  NEUTROABS 7.1  --   --   --   --   HGB 14.6  < > 8.5* 8.8* 8.7*  HCT 43.5  < > 25.6* 26.3* 26.9*  MCV 89.7  < > 89.2 88.3 89.1  PLT 156  < > 256 286 371  < > = values in this interval not displayed. Cardiac Enzymes:  Recent Labs  10/01/16 1626 10/01/16 1950 10/02/16 0455  TROPONINI 0.42* 28.46* 47.50*   BNP: Invalid input(s): POCBNP CBG:  Recent Labs  10/12/16 1551 10/12/16 2136 10/13/16 0740  GLUCAP 99 86 81    Radiological Exams: Dg Chest Port 1 View  Result Date: 10/12/2016 CLINICAL DATA:  Status post cardiac surgery EXAM: PORTABLE CHEST 1 VIEW COMPARISON:  10/10/2016 FINDINGS: Cardiac shadow is stable. Postsurgical changes are again noted. Persistent left basilar changes are noted with new small pleural effusion. Fullness in the right peritracheal region is noted related to patient rotation to the right. No other focal abnormality is seen. IMPRESSION: Slight increase in left basilar atelectasis/ infiltrate and small left effusion. Electronically Signed   By: Alcide Clever M.D.   On: 10/12/2016 07:58   Dg Chest Port 1 View  Result Date: 10/10/2016 CLINICAL DATA:  Status post coronary artery bypass graft. EXAM: PORTABLE CHEST 1 VIEW COMPARISON:  Radiograph of October 09, 2016. FINDINGS: Stable cardiomediastinal silhouette. Sternotomy wires are noted. No pneumothorax is noted. Stable position of right internal jugular venous sheath. Right lung is clear. Mild left basilar subsegmental atelectasis is noted. Bony thorax is unremarkable. IMPRESSION: Mild left basilar subsegmental atelectasis. Status post coronary artery bypass graft. Electronically Signed   By: Lupita Raider, M.D.   On: 10/10/2016 07:17   Dg Chest Port 1 View  Result Date:  10/09/2016 CLINICAL DATA:  CABG, postop day 2. EXAM: PORTABLE CHEST 1 VIEW COMPARISON:  10/08/2016 and prior exams FINDINGS: Cardiomegaly, CABG changes and right IJ central venous catheter sheath again noted. There has been interval removal of a Swan-Ganz catheter and mediastinal and left thoracostomy tubes. Bibasilar atelectasis again noted. There is no evidence of pulmonary edema or pneumothorax. IMPRESSION: Support apparatus removal otherwise unchanged appearance of the chest. No evidence of pneumothorax. Electronically Signed   By: Harmon Pier M.D.   On: 10/09/2016 07:31   Dg Chest Port 1 View  Result Date: 10/08/2016 CLINICAL DATA:  Postop day 1 CABG. EXAM: PORTABLE CHEST 1 VIEW COMPARISON:  One-view chest x-ray 10/07/2016 FINDINGS: The patient has been extubated. Left-sided chest tube and mediastinal drain remain. The tip of the Swan-Ganz catheter is in the proximal right pulmonary artery. There is no pneumothorax. A left pleural effusion remains. Left basilar airspace disease is decreased, likely reflecting atelectasis. Small right pleural effusion is noted. IMPRESSION: 1. Interval extubation. 2. Decreasing left pleural effusion and associated atelectasis. 3. Small right pleural effusion. Electronically Signed   By: Marin Roberts M.D.   On: 10/08/2016 07:37   Dg Chest Port 1 View  Result Date: 10/07/2016 CLINICAL DATA:  83 year old male with a history of previous CABG EXAM: PORTABLE CHEST 1 VIEW COMPARISON:  10/01/2016, 08/13/2014  FINDINGS: Interval surgical changes of median sternotomy and CABG. Interval placement of endotracheal tube, which terminates suitably above the carina, approximately 2.7 cm. Interval placement of right IJ sheath through which a Swan-Ganz catheter terminates in the right pulmonary artery. Interval placement of gastric tube, terminating below the diaphragm out of the field of view. Interval placement of mediastinal drains and left thoracostomy tube. Low lung volumes.  Opacity at the left lung base partially obscuring the left hemidiaphragm and left heart border. No pneumothorax. IMPRESSION: Early postop changes of median sternotomy and CABG, with opacity at the left lung base, likely a combination of atelectasis and small pleural fluid. No visualized pneumothorax. Surgical apparatus includes endotracheal tube, gastric tube, right IJ sheath/Swan-Ganz, mediastinal/ pleural drains, as above. Electronically Signed   By: Gilmer Mor D.O.   On: 10/07/2016 14:02   Dg Chest Portable 1 View  Result Date: 10/01/2016 CLINICAL DATA:  Myocardial infarction EXAM: PORTABLE CHEST 1 VIEW COMPARISON:  August 13, 2014 FINDINGS: The heart size and mediastinal contours are within normal limits. Both lungs are clear. The visualized skeletal structures are unremarkable. IMPRESSION: No active disease. Electronically Signed   By: Gerome Sam III M.D   On: 10/01/2016 17:21   Dg Abd Portable 1v  Result Date: 10/10/2016 CLINICAL DATA:  CABG.  Nausea.  No bowel movement. EXAM: PORTABLE ABDOMEN - 1 VIEW COMPARISON:  07/05/2013.  CT 05/06/2013. FINDINGS: Soft tissue structures are unremarkable. No bowel distention. Stool noted throughout the colon. No free air. Degenerative changes lumbar spine and both hips. IMPRESSION: No acute abnormality identified. No bowel distention. Stool noted throughout the colon. Electronically Signed   By: Maisie Fus  Register   On: 10/10/2016 10:18    Assessment/Plan  Generalized weakness Will have him work with physical therapy and occupational therapy team to help with gait training and muscle strengthening exercises.fall precautions. Skin care. Encourage to be out of bed.   CAD s/p CABG X3. Will need follow up with cardiology and cardiothoracic surgery. Continue metoprolol tartrate 12.5 mg bid, lipitor 10 mg daily, aspirin ec 325 mg daily  Chest wall soreness Post surgery, continue tramadol 50 mg q6h prn pain. Continue flexeril 5 mg qhs but change this to  qhs x 1 week, then prn and monitor.   Blood loss anemia Post op, monitor cbc  Protein calorie malnutrition RD consult, continue medpass supplement  Depression  Likely situational. Will get psychiatry consult. Start sertraline 25 mg qhs and monitor mood.   Cellulitis Mild erythema to graft site. Currently on keflex 500 mg tid until 10/23/16. Check cbc with diff. Graft site appears good today  Constipation On dulcolax 2 tab daily and suppository daily with miralax daily for now. . Add prune juice with breakfast. Change miralax to bid.   Hyperlipidemia Continue lipitor 10 mg daily  GERD Continue pantoprazole 40 mg daily and monitor    Goals of care: short term rehabilitation   Labs/tests ordered: cbc, cmp 10/24/16  Family/ staff Communication: reviewed care plan with patient, his wife and nursing supervisor  I have spent greater than 50 minutes for this encounter which includes reviewing hospital records, addressing above mentioned concerns, reviewing care plan with patient and his wife, answering patient's concerns and counseling     Oneal Grout, MD Internal Medicine [facility] [address] [place], [place] [address] Cell Phone (Monday-Friday 8 am - 5 pm): [phone] On Call: [phone] and follow prompts after 5 pm and on weekends Office Phone: [phone] Office  Fax: [phone]

## 2016-10-25 DIAGNOSIS — E46 Unspecified protein-calorie malnutrition: Secondary | ICD-10-CM

## 2016-10-25 HISTORY — DX: Unspecified protein-calorie malnutrition: E46

## 2016-10-26 ENCOUNTER — Emergency Department (HOSPITAL_COMMUNITY): Payer: Medicare Other

## 2016-10-26 ENCOUNTER — Inpatient Hospital Stay (HOSPITAL_COMMUNITY)
Admission: EM | Admit: 2016-10-26 | Discharge: 2016-10-29 | DRG: 193 | Disposition: A | Discharge status: Skilled Nursing Facility | Payer: Medicare Other | Attending: Internal Medicine | Admitting: Internal Medicine

## 2016-10-26 ENCOUNTER — Encounter (HOSPITAL_COMMUNITY): Payer: Self-pay | Admitting: *Deleted

## 2016-10-26 DIAGNOSIS — Z885 Allergy status to narcotic agent status: Secondary | ICD-10-CM

## 2016-10-26 DIAGNOSIS — Z887 Allergy status to serum and vaccine status: Secondary | ICD-10-CM

## 2016-10-26 DIAGNOSIS — E784 Other hyperlipidemia: Secondary | ICD-10-CM | POA: Diagnosis not present

## 2016-10-26 DIAGNOSIS — Y95 Nosocomial condition: Secondary | ICD-10-CM | POA: Diagnosis present

## 2016-10-26 DIAGNOSIS — I2510 Atherosclerotic heart disease of native coronary artery without angina pectoris: Secondary | ICD-10-CM | POA: Diagnosis present

## 2016-10-26 DIAGNOSIS — E43 Unspecified severe protein-calorie malnutrition: Secondary | ICD-10-CM | POA: Diagnosis present

## 2016-10-26 DIAGNOSIS — Z6824 Body mass index (BMI) 24.0-24.9, adult: Secondary | ICD-10-CM

## 2016-10-26 DIAGNOSIS — D638 Anemia in other chronic diseases classified elsewhere: Secondary | ICD-10-CM | POA: Diagnosis present

## 2016-10-26 DIAGNOSIS — G9340 Encephalopathy, unspecified: Secondary | ICD-10-CM | POA: Diagnosis not present

## 2016-10-26 DIAGNOSIS — Z87442 Personal history of urinary calculi: Secondary | ICD-10-CM

## 2016-10-26 DIAGNOSIS — J189 Pneumonia, unspecified organism: Secondary | ICD-10-CM | POA: Diagnosis not present

## 2016-10-26 DIAGNOSIS — Z79899 Other long term (current) drug therapy: Secondary | ICD-10-CM

## 2016-10-26 DIAGNOSIS — Z87891 Personal history of nicotine dependence: Secondary | ICD-10-CM

## 2016-10-26 DIAGNOSIS — K219 Gastro-esophageal reflux disease without esophagitis: Secondary | ICD-10-CM

## 2016-10-26 DIAGNOSIS — Z86010 Personal history of colonic polyps: Secondary | ICD-10-CM

## 2016-10-26 DIAGNOSIS — E7849 Other hyperlipidemia: Secondary | ICD-10-CM

## 2016-10-26 DIAGNOSIS — I252 Old myocardial infarction: Secondary | ICD-10-CM

## 2016-10-26 DIAGNOSIS — Z951 Presence of aortocoronary bypass graft: Secondary | ICD-10-CM

## 2016-10-26 DIAGNOSIS — Z91048 Other nonmedicinal substance allergy status: Secondary | ICD-10-CM

## 2016-10-26 DIAGNOSIS — F419 Anxiety disorder, unspecified: Secondary | ICD-10-CM | POA: Diagnosis present

## 2016-10-26 DIAGNOSIS — R471 Dysarthria and anarthria: Secondary | ICD-10-CM | POA: Diagnosis present

## 2016-10-26 DIAGNOSIS — Z7982 Long term (current) use of aspirin: Secondary | ICD-10-CM

## 2016-10-26 DIAGNOSIS — Z09 Encounter for follow-up examination after completed treatment for conditions other than malignant neoplasm: Secondary | ICD-10-CM

## 2016-10-26 DIAGNOSIS — L0390 Cellulitis, unspecified: Secondary | ICD-10-CM | POA: Diagnosis present

## 2016-10-26 DIAGNOSIS — I10 Essential (primary) hypertension: Secondary | ICD-10-CM | POA: Diagnosis present

## 2016-10-26 DIAGNOSIS — F329 Major depressive disorder, single episode, unspecified: Secondary | ICD-10-CM | POA: Diagnosis present

## 2016-10-26 HISTORY — DX: Pneumonia, unspecified organism: J18.9

## 2016-10-26 HISTORY — DX: Unspecified protein-calorie malnutrition: E46

## 2016-10-26 HISTORY — DX: Dysarthria and anarthria: R47.1

## 2016-10-26 HISTORY — DX: Atherosclerotic heart disease of native coronary artery without angina pectoris: I25.10

## 2016-10-26 LAB — COMPREHENSIVE METABOLIC PANEL
ALBUMIN: 2.9 g/dL — AB (ref 3.5–5.0)
ALK PHOS: 107 U/L (ref 38–126)
ALT: 28 U/L (ref 17–63)
ANION GAP: 8 (ref 5–15)
AST: 25 U/L (ref 15–41)
BILIRUBIN TOTAL: 1.3 mg/dL — AB (ref 0.3–1.2)
BUN: 14 mg/dL (ref 6–20)
CALCIUM: 8.5 mg/dL — AB (ref 8.9–10.3)
CO2: 22 mmol/L (ref 22–32)
CREATININE: 1.03 mg/dL (ref 0.61–1.24)
Chloride: 107 mmol/L (ref 101–111)
GFR calc non Af Amer: >60 mL/min (ref >60)
GLUCOSE: 121 mg/dL — AB (ref 65–99)
Potassium: 4 mmol/L (ref 3.5–5.1)
SODIUM: 137 mmol/L (ref 135–145)
TOTAL PROTEIN: 6.4 g/dL — AB (ref 6.5–8.1)

## 2016-10-26 LAB — CBC
HEMATOCRIT: 31.2 % — AB (ref 39.0–52.0)
Hemoglobin: 9.8 g/dL — ABNORMAL LOW (ref 13.0–17.0)
MCH: 28.7 pg (ref 26.0–34.0)
MCHC: 31.4 g/dL (ref 30.0–36.0)
MCV: 91.2 fL (ref 78.0–100.0)
PLATELETS: 454 10*3/uL — AB (ref 150–400)
RBC: 3.42 MIL/uL — ABNORMAL LOW (ref 4.22–5.81)
RDW: 15.6 % — ABNORMAL HIGH (ref 11.5–15.5)
WBC: 5.9 10*3/uL (ref 4.0–10.5)

## 2016-10-26 LAB — URINALYSIS, ROUTINE W REFLEX MICROSCOPIC
BILIRUBIN URINE: NEGATIVE
GLUCOSE, UA: NEGATIVE mg/dL
Hgb urine dipstick: NEGATIVE
KETONES UR: NEGATIVE mg/dL
Leukocytes, UA: NEGATIVE
Nitrite: NEGATIVE
PH: 7 (ref 5.0–8.0)
PROTEIN: NEGATIVE mg/dL
Specific Gravity, Urine: 1.015 (ref 1.005–1.030)

## 2016-10-26 LAB — I-STAT ARTERIAL BLOOD GAS, ED
Acid-base deficit: 1 mmol/L (ref 0.0–2.0)
Bicarbonate: 21.9 mmol/L (ref 20.0–28.0)
O2 Saturation: 96 %
PCO2 ART: 29.2 mmHg — AB (ref 32.0–48.0)
PH ART: 7.483 — AB (ref 7.350–7.450)
PO2 ART: 75 mmHg — AB (ref 83.0–108.0)
TCO2: 23 mmol/L (ref 0–100)

## 2016-10-26 LAB — TROPONIN I: TROPONIN I: 0.07 ng/mL — AB (ref <0.03)

## 2016-10-26 LAB — CBG MONITORING, ED: GLUCOSE-CAPILLARY: 105 mg/dL — AB (ref 65–99)

## 2016-10-26 LAB — MAGNESIUM: Magnesium: 2.1 mg/dL (ref 1.7–2.4)

## 2016-10-26 LAB — AMMONIA: Ammonia: 19 umol/L (ref 9–35)

## 2016-10-26 LAB — PHOSPHORUS: PHOSPHORUS: 3.5 mg/dL (ref 2.5–4.6)

## 2016-10-26 LAB — VITAMIN B12: VITAMIN B 12: 769 pg/mL (ref 180–914)

## 2016-10-26 LAB — I-STAT CG4 LACTIC ACID, ED: Lactic Acid, Venous: 1.25 mmol/L (ref 0.5–1.9)

## 2016-10-26 MED ORDER — POLYVINYL ALCOHOL 1.4 % OP SOLN
2.0000 [drp] | OPHTHALMIC | Status: DC | PRN
  Filled 2016-10-26: qty 15

## 2016-10-26 MED ORDER — PIPERACILLIN-TAZOBACTAM 3.375 G IVPB 30 MIN
3.3750 g | Freq: Once | INTRAVENOUS | Status: AC
  Administered 2016-10-26: 3.375 g via INTRAVENOUS
  Filled 2016-10-26: qty 50

## 2016-10-26 MED ORDER — ACETAMINOPHEN 325 MG PO TABS
650.0000 mg | ORAL_TABLET | Freq: Four times a day (QID) | ORAL | Status: DC | PRN
  Administered 2016-10-27: 650 mg via ORAL
  Filled 2016-10-26: qty 2

## 2016-10-26 MED ORDER — SODIUM CHLORIDE 0.9 % IV SOLN
1250.0000 mg | Freq: Once | INTRAVENOUS | Status: AC
  Administered 2016-10-26: 1250 mg via INTRAVENOUS
  Filled 2016-10-26: qty 1250

## 2016-10-26 MED ORDER — ENSURE ENLIVE PO LIQD
237.0000 mL | Freq: Three times a day (TID) | ORAL | Status: DC
  Administered 2016-10-26 – 2016-10-29 (×8): 237 mL via ORAL

## 2016-10-26 MED ORDER — DEXTROSE 5 % IV SOLN
1.0000 g | INTRAVENOUS | Status: DC
  Administered 2016-10-27 – 2016-10-28 (×2): 1 g via INTRAVENOUS
  Filled 2016-10-26 (×4): qty 1

## 2016-10-26 MED ORDER — SODIUM CHLORIDE 0.9 % IV SOLN
INTRAVENOUS | Status: AC
  Administered 2016-10-26: 15:00:00 via INTRAVENOUS

## 2016-10-26 MED ORDER — VANCOMYCIN HCL IN DEXTROSE 1-5 GM/200ML-% IV SOLN
1000.0000 mg | INTRAVENOUS | Status: DC
  Administered 2016-10-27 – 2016-10-29 (×3): 1000 mg via INTRAVENOUS
  Filled 2016-10-26 (×3): qty 200

## 2016-10-26 MED ORDER — HYDROCODONE-ACETAMINOPHEN 5-325 MG PO TABS
1.0000 | ORAL_TABLET | Freq: Four times a day (QID) | ORAL | Status: DC | PRN
Start: 2016-10-26 — End: 2016-10-29
  Administered 2016-10-26: 1 via ORAL
  Administered 2016-10-28: 2 via ORAL
  Filled 2016-10-26: qty 2
  Filled 2016-10-26: qty 1

## 2016-10-26 MED ORDER — ACETAMINOPHEN 650 MG RE SUPP: 650.0000 mg | Freq: Four times a day (QID) | RECTAL | Status: DC | PRN

## 2016-10-26 MED ORDER — DOCUSATE SODIUM 100 MG PO CAPS
100.0000 mg | ORAL_CAPSULE | Freq: Two times a day (BID) | ORAL | Status: DC
  Administered 2016-10-26 – 2016-10-27 (×2): 100 mg via ORAL
  Filled 2016-10-26 (×4): qty 1

## 2016-10-26 MED ORDER — POLYETHYLENE GLYCOL 3350 17 G PO PACK
17.0000 g | PACK | Freq: Every day | ORAL | Status: DC
  Administered 2016-10-26 – 2016-10-27 (×2): 17 g via ORAL
  Filled 2016-10-26 (×2): qty 1

## 2016-10-26 MED ORDER — ONDANSETRON HCL 4 MG PO TABS: 4.0000 mg | ORAL_TABLET | Freq: Four times a day (QID) | ORAL | Status: DC | PRN

## 2016-10-26 MED ORDER — ONDANSETRON HCL 4 MG/2ML IJ SOLN: 4.0000 mg | Freq: Four times a day (QID) | INTRAMUSCULAR | Status: DC | PRN

## 2016-10-26 MED ORDER — ASPIRIN EC 325 MG PO TBEC
325.0000 mg | DELAYED_RELEASE_TABLET | Freq: Every day | ORAL | Status: DC
  Administered 2016-10-27 – 2016-10-29 (×3): 325 mg via ORAL
  Filled 2016-10-26 (×3): qty 1

## 2016-10-26 MED ORDER — BISACODYL 10 MG RE SUPP: 10.0000 mg | Freq: Every day | RECTAL | Status: DC

## 2016-10-26 MED ORDER — BISACODYL 5 MG PO TBEC
10.0000 mg | DELAYED_RELEASE_TABLET | Freq: Every day | ORAL | Status: DC
  Administered 2016-10-28: 10 mg via ORAL
  Filled 2016-10-26 (×2): qty 2

## 2016-10-26 MED ORDER — SODIUM CHLORIDE 0.9% FLUSH
3.0000 mL | Freq: Two times a day (BID) | INTRAVENOUS | Status: DC
  Administered 2016-10-26 – 2016-10-29 (×4): 3 mL via INTRAVENOUS

## 2016-10-26 MED ORDER — PANTOPRAZOLE SODIUM 40 MG PO TBEC
40.0000 mg | DELAYED_RELEASE_TABLET | Freq: Every day | ORAL | Status: DC
  Administered 2016-10-28 – 2016-10-29 (×2): 40 mg via ORAL
  Filled 2016-10-26 (×3): qty 1

## 2016-10-26 MED ORDER — METOPROLOL TARTRATE 25 MG PO TABS
12.5000 mg | ORAL_TABLET | Freq: Two times a day (BID) | ORAL | Status: DC
  Administered 2016-10-26 – 2016-10-29 (×6): 12.5 mg via ORAL
  Filled 2016-10-26 (×6): qty 1

## 2016-10-26 MED ORDER — ATORVASTATIN CALCIUM 10 MG PO TABS
10.0000 mg | ORAL_TABLET | Freq: Every day | ORAL | Status: DC
  Administered 2016-10-27 – 2016-10-28 (×2): 10 mg via ORAL
  Filled 2016-10-26 (×2): qty 1

## 2016-10-26 MED ORDER — DEXTROSE 5 % IV SOLN
1.0000 g | Freq: Three times a day (TID) | INTRAVENOUS | Status: DC
  Administered 2016-10-26: 1 g via INTRAVENOUS
  Filled 2016-10-26 (×3): qty 1

## 2016-10-26 MED ORDER — ENOXAPARIN SODIUM 40 MG/0.4ML ~~LOC~~ SOLN
40.0000 mg | SUBCUTANEOUS | Status: DC
  Administered 2016-10-27 – 2016-10-29 (×3): 40 mg via SUBCUTANEOUS
  Filled 2016-10-26 (×3): qty 0.4

## 2016-10-26 MED ORDER — VANCOMYCIN HCL IN DEXTROSE 1-5 GM/200ML-% IV SOLN
1000.0000 mg | INTRAVENOUS | Status: DC
Start: 2016-10-27 — End: 2016-10-26

## 2016-10-26 MED ORDER — ENSURE ENLIVE PO LIQD: 237.0000 mL | Freq: Two times a day (BID) | ORAL | Status: DC

## 2016-10-26 NOTE — H&P (Signed)
History and Physical    Jason [name] WUJ:[id number] DOB: [birth year] DOA: 10/26/2016  PCP: Minda Meo, MD Patient coming from: rehab facility  Chief Complaint: ams  HPI: Jason [name] is a 83 y.o. male with medical history significant for CAD status post CABG April 13 of this year, GERD, hyperlipidemia, dysarthria, anemia, protein calorie malnutrition persists emergency Department chief complaint altered mental status. Initial evaluation includes chest x-ray concerning for pneumonia.  Information is obtained from the patient and his wife who is at the bedside. Patient has been a resident of [facility] since discharge from the hospital 2 weeks ago after MI and coronary artery bypass surgery. Family reports he's been participating in physical therapy without complications.. No reports of any nausea vomiting diarrhea. He does has a tendency towards constipation but was given a suppository and had a large BM yesterday. This morning staff found him verbally unresponsive. No response to noxious stimuli. He has a Comptroller during the night who indicated last night he used his walker to get to the bathroom. Patient denies any chest pain palpitations headache. He denies shortness of breath abdominal pain nausea. He denies dysuria. Wife reports new medications includes are often tramadol and he receives Flexeril at night  ED Course: The emergency department he is afebrile hemodynamically stable  Review of Systems:  14 point review of systems reviewed and all negative except as per HPI.   Ambulatory Status: Ambulate with a walker minimal assist with ADLs currently residing in a rehabilitation facility  Past Medical History:  Diagnosis Date  . Anxiety    takes Diazepam daily prn  . Arthritis   . Constipation   . Depression    takes Trazodone nightly  . Dysarthria   . Enlarged prostate   . GERD (gastroesophageal reflux disease)   . H/O hiatal hernia    takes Omeprazole daily  .  Hemorrhoids   . History of colitis    many yrs ago  . History of colon polyps   . History of kidney stones    passed on his own  . Hyperlipidemia    was on medication but has been off for a while  . Insomnia    takes Trazodone nightly  . 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 STEMI (ST elevation myocardial infarction) (HCC)   . Urinary frequency     Past Surgical History:  Procedure Laterality Date  . bilateral cataract surgery    . CARDIAC SURGERY    . CIRCUMCISION    . COLONOSCOPY    . CORONARY ARTERY BYPASS GRAFT N/A 10/07/2016   Procedure: CORONARY ARTERY BYPASS GRAFTING (CABG) x 3 , using internal mammary, and right greater saphenous vein harvested endoscopically SVG-PD, SVG-OM, LIMA-LAD;  Surgeon: [name], MD;  Location: [facility] OR;  Service: Open Heart Surgery;  Laterality: N/A;  . DIRECT LARYNGOSCOPY N/A 06/26/2013   Procedure: DIRECT LARYNGOSCOPY;  Surgeon: Christia Reading, MD;  Location[facility] OR;  Service: ENT;  Laterality: N/A;  . EAR CYST EXCISION N/A 04/05/2013   Procedure: CYST REMOVAL;  Surgeon: Melvenia Beam, MD;  Location: [facility] OR;  Service: ENT;  Laterality: N/A;  . ESOPHAGOGASTRODUODENOSCOPY    . ESOPHAGOGASTRODUODENOSCOPY N/A 07/15/2013   Procedure: ESOPHAGOGASTRODUODENOSCOPY (EGD);  Surgeon: Cherylynn Ridges, MD;  Location: [facility] ENDOSCOPY;  Service: General;  Laterality: N/A;  . HEMORRHOIDECTOMY WITH HEMORRHOID BANDING    . HERNIA REPAIR     double  . LEFT HEART CATH AND CORONARY ANGIOGRAPHY N/A 10/01/2016   Procedure: Left  Heart Cath and Coronary Angiography;  Surgeon: Lyn Records, MD;  Location: [facility] INVASIVE CV LAB;  Service: Cardiovascular;  Laterality: N/A;  . PEG PLACEMENT N/A 07/15/2013   Procedure: PERCUTANEOUS ENDOSCOPIC GASTROSTOMY (PEG) PLACEMENT;  Surgeon: Cherylynn Ridges, MD;  Location: [facility] ENDOSCOPY;  Service: General;  Laterality: N/A;  . PERIPHERALLY INSERTED CENTRAL CATHETER INSERTION    . right knee arthroscopy    . SEPTOPLASTY N/A 04/05/2013    Procedure: SEPTOPLASTY;  Surgeon: Melvenia Beam, MD;  Location: [facility] OR;  Service: ENT;  Laterality: N/A;  . SINUS ENDO W/FUSION N/A 07/03/2013   Procedure: ENDOSCOPIC SINUS SURGERY WITH FUSION NAVIGATION with By-Nasopharongeal Biopsy;  Surgeon: Christia Reading, MD;  Location: [facility] OR;  Service: ENT;  Laterality: N/A;  . SINUS EXPLORATION Bilateral 07/11/2013   Procedure:  BEDSIDE  SINUS EXPLORATION ;  Surgeon: Christia Reading, MD;  Location: [facility] OR;  Service: ENT;  Laterality: Bilateral;  . TEE WITHOUT CARDIOVERSION N/A 10/07/2016   Procedure: TRANSESOPHAGEAL ECHOCARDIOGRAM (TEE);  Surgeon: [name], MD;  Location: [facility] OR;  Service: Open Heart Surgery;  Laterality: N/A;  . TONSILLECTOMY      Social History   Social History  . Marital status: Married    Spouse name: N/A  . Number of children: N/A  . Years of education: N/A   Occupational History  . Not on file.   Social History Main Topics  . Smoking status: Former Smoker    Quit date: 07/01/1993  . Smokeless tobacco: Former Neurosurgeon    Types: Chew    Quit date: 12/29/2012     Comment: quit chewing tobacco several months ago and stopped smoking cigars yrs ago  . Alcohol use No  . Drug use: No  . Sexual activity: No   Other Topics Concern  . Not on file   Social History Narrative  . No narrative on file    Allergies  Allergen Reactions  . Bee Pollen Anaphylaxis  . Tetanus Toxoids Swelling    SWELLING REACTION UNSPECIFIED ? LOCAL REACTION ?  . Hydrocodone Other (See Comments)    Confusion, "talking out of his head"    Family History  Problem Relation Age of Onset  . CAD Father     Prior to Admission medications   Medication Sig Start Date End Date Taking? Authorizing Provider  aspirin EC 325 MG EC tablet Take 1 tablet (325 mg total) by mouth daily. 10/20/16   Sharlene Dory, PA-C  atorvastatin (LIPITOR) 10 MG tablet Take 1 tablet (10 mg total) by mouth daily at 6 PM. 10/19/16   Sharlene Dory, PA-C  bisacodyl (DULCOLAX) 10 MG  suppository Place 1 suppository (10 mg total) rectally daily. 10/20/16   Sharlene Dory, PA-C  bisacodyl (DULCOLAX) 5 MG EC tablet Take 2 tablets (10 mg total) by mouth daily. 10/20/16   Sharlene Dory, PA-C  cyclobenzaprine (FLEXERIL) 5 MG tablet Take 1 tablet (5 mg total) by mouth at bedtime. 09/29/14   Tonye Pearson, MD  metoprolol tartrate (LOPRESSOR) 25 MG tablet Take 0.5 tablets (12.5 mg total) by mouth 2 (two) times daily. 10/19/16   Sharlene Dory, PA-C  pantoprazole (PROTONIX) 40 MG tablet Take 1 tablet (40 mg total) by mouth daily. 10/20/16   Sharlene Dory, PA-C  polyethylene glycol (MIRALAX / GLYCOLAX) packet Take 17 g by mouth daily. 10/20/16   Sharlene Dory, PA-C  polyvinyl alcohol (LIQUIFILM TEARS) 1.4 % ophthalmic solution Place 2 drops into both eyes as needed  for dry eyes. 10/19/16   Sharlene Dory, PA-C  traMADol (ULTRAM) 50 MG tablet Take 1 tablet (50 mg total) by mouth every 6 (six) hours as needed for moderate pain. 10/19/16   Sharlene Dory, PA-C  UNABLE TO FIND Med Name: Med pass 120 mL by mouth 3 times daily between meals    Historical Provider, MD    Physical Exam: Vitals:   10/26/16 1100 10/26/16 1215 10/26/16 1249 10/26/16 1300  BP: (!) 146/77 127/75 119/79 120/86  Pulse: 74 78 76 76  Resp: (!) Temp:      TempSrc:      SpO2: 94% 95% 96% 95%  Weight:      Height:         General:  Appears Slightly pale. Thin frail no acute distress Eyes:  PERRL, EOMI, normal lids, iris ENT:  grossly normal hearing, lips & tongue, mucous membranes of his mouth are pink but dry Neck:  no LAD, masses or thyromegaly Cardiovascular:  RRR, no m/r/g. No LE edema. Pedal pulses present and palpable Respiratory:  CTA bilaterally, no w/r/r. Normal respiratory effort. Abdomen:  soft, ntnd, positive bowel sounds throughout 1 stitch and abdomen clean and dry Skin:  no rash or induration seen on limited exam Musculoskeletal:  grossly normal tone BUE/BLE, good ROM, no bony  abnormality Psychiatric:  grossly normal mood and affect, speech fluent and appropriate, AOx3 Neurologic:  Patient arouses to verbal stimuli. Oriented to self and place. Follows commands. Speech is slow but clear  Labs on Admission: I have personally reviewed following labs and imaging studies  CBC: No results for input(s): WBC, NEUTROABS, HGB, HCT, MCV, PLT in the last 168 hours. Basic Metabolic Panel:  Recent Labs Lab 10/26/16 0945  NA 137  K 4.0  CL 107  CO2 22  GLUCOSE 121*  BUN 14  CREATININE 1.03  CALCIUM 8.5*   GFR: Estimated Creatinine Clearance: 44.7 mL/min (by C-G formula based on SCr of 1.03 mg/dL). Liver Function Tests:  Recent Labs Lab 10/26/16 0945  AST 25  ALT 28  ALKPHOS 107  BILITOT 1.3*  PROT 6.4*  ALBUMIN 2.9*   No results for input(s): LIPASE, AMYLASE in the last 168 hours. No results for input(s): AMMONIA in the last 168 hours. Coagulation Profile: No results for input(s): INR, PROTIME in the last 168 hours. Cardiac Enzymes: No results for input(s): CKTOTAL, CKMB, CKMBINDEX, TROPONINI in the last 168 hours. BNP (last 3 results) No results for input(s): PROBNP in the last 8760 hours. HbA1C: No results for input(s): HGBA1C in the last 72 hours. CBG:  Recent Labs Lab 10/26/16 0951  GLUCAP 105*   Lipid Profile: No results for input(s): CHOL, HDL, LDLCALC, TRIG, CHOLHDL, LDLDIRECT in the last 72 hours. Thyroid Function Tests: No results for input(s): TSH, T4TOTAL, FREET4, T3FREE, THYROIDAB in the last 72 hours. Anemia Panel: No results for input(s): VITAMINB12, FOLATE, FERRITIN, TIBC, IRON, RETICCTPCT in the last 72 hours. Urine analysis:    Component Value Date/Time   COLORURINE YELLOW 10/26/2016 1041   APPEARANCEUR CLEAR 10/26/2016 1041   LABSPEC 1.015 10/26/2016 1041   PHURINE 7.0 10/26/2016 1041   GLUCOSEU NEGATIVE 10/26/2016 1041   HGBUR NEGATIVE 10/26/2016 1041   BILIRUBINUR NEGATIVE 10/26/2016 1041   KETONESUR NEGATIVE  10/26/2016 1041   PROTEINUR NEGATIVE 10/26/2016 1041   UROBILINOGEN 1.0 07/06/2013 1156   NITRITE NEGATIVE 10/26/2016 1041   LEUKOCYTESUR NEGATIVE 10/26/2016 1041    Creatinine Clearance: Estimated Creatinine Clearance: 44.7  mL/min (by C-G formula based on SCr of 1.03 mg/dL).  Sepsis Labs: (procalcitonin:4,lacticidven:4) )No results found for this or any previous visit (from the past 240 hour(s)).   Radiological Exams on Admission: Ct Head Wo Contrast  Result Date: 10/26/2016 CLINICAL DATA:  Change in mental status.CABG 4/13 states patient had been very "fidgety for several days and unresponsive today. Patient would open his eye with stimulation EXAM: CT HEAD WITHOUT CONTRAST TECHNIQUE: Contiguous axial images were obtained from the base of the skull through the vertex without intravenous contrast. COMPARISON:  Brain MRI, 08/08/2013.  Head CT, 06/24/2013. FINDINGS: Brain: No evidence of acute infarction, hemorrhage, hydrocephalus, extra-axial collection or mass lesion/mass effect. Vascular: No hyperdense vessel or unexpected calcification. Skull: Normal. Negative for fracture or focal lesion. Sinuses/Orbits: Visualize globes orbits are unremarkable. Visualized sinuses are relatively clear. Clear mastoid air cells. Other: None. IMPRESSION: 1. No acute intracranial abnormalities. Electronically Signed   By: Amie Portland M.D.   On: 10/26/2016 12:52   Dg Chest Port 1 View  Result Date: 10/26/2016 CLINICAL DATA:  Confusion EXAM: PORTABLE CHEST 1 VIEW COMPARISON:  October 12, 2016 FINDINGS: There is consolidation in the left lower lobe. Lungs elsewhere are clear. Heart is upper normal in size with pulmonary vascularity within normal limits. Patient is status post coronary artery bypass grafting. No adenopathy. There is aortic atherosclerosis. No evident bone lesions. IMPRESSION: Left lower lobe consolidation consistent with pneumonia. Lungs elsewhere clear. Stable cardiac silhouette. There is  aortic atherosclerosis. Electronically Signed   By: Bretta Bang III M.D.   On: 10/26/2016 09:45    EKG: Independently reviewed. Sinus rhythm Anterior infarct, age indeterminate  Assessment/Plan Principal Problem:   Acute encephalopathy Active Problems:   GERD (gastroesophageal reflux disease)   Protein-calorie malnutrition, severe (HCC)   HCAP (healthcare-associated pneumonia)   #1. Acute encephalopathy. Likely multifactorial specifically infection (HCAP) versus pharmacy versus dehydration vs hypoxia. CT of the head without acute abnormalities. Chest x-ray left lower lobe consolidation consistent with pneumonia. Urinalysis unremarkable, CBG 105 lactic acid within the limits of normal, arterial blood gas with a pH of 7.48, PCO2 29.2 PO2 75. Oxygen saturation level greater than 90% on room air -Admit to telemetry -Follow blood cultures -Antibiotics per protocol -Strep pneumo urine antigen -Hold sedating medications -Follow ammonia -Obtain B-12, folate, RPR -Check phosphorus and magnesium  #2. Healthcare associated pneumonia. Chest x-ray as noted above. Patient's afebrile nontoxic appearing. Hemodynamically stable -Antibiotics per protocol -Follow blood cultures -Sputum culture as able -Strep pneumo urine antigen -See #1  #3.HX recent STEMI s/p cabg. denies chest pain. EKG sinus rhythm as noted above. Patient denies chest pain. Home medications include aspirin Lipitor -Continue aspirin and Lipitor -Monitor on telemetry  #4. Hypertension. Fair control in the emergency department -Continue beta blocker  #5. Dysarthria.  -appears stable at baseline -speech eval    DVT prophylaxis: scd  Code Status: limited  Family Communication: wife and daughter  Disposition Plan: home or facility Consults called: none  Admission status: obs    Toya Smothers M MD [facility]  If 7PM-7AM, please contact night-coverage [email or web address] Password [email or web address]  10/26/2016, 2:05 PM

## 2016-10-26 NOTE — ED Notes (Signed)
Family updated on delay.

## 2016-10-26 NOTE — ED Provider Notes (Signed)
[facility]-EMERGENCY DEPT Provider Note   CSN: [id number] Arrival date & time: 10/26/16  0920     History   Chief Complaint Chief Complaint  Patient presents with  . Altered Mental Status    HPI Jason [name] is a 83 y.o. male. Chief complaint is decreased mental status.  HPI:  This is an 83 year old male who resides at a rehabilitation facility after recent admission for heart attack and ultimately coronary artery bypass surgery. He presents via ambulance today with a decreased level of responsiveness to family and staff at his care facility.  Patient was in his normal state of health had actually elevated, mode, seated, and fertilized his yard as recently as the first week in April.  After a week of intermittent scapular pain he presented here 4/7 after being at urgent care and showing ST elevation MI. Ultimately here he underwent heart cath 4/8  and then coronary artery bypass grafting 4/14. He progressed slowly but without significant complications. Was transferred to care facility on 4/18.  He has a Comptroller at [employer] at his care facility. His wife is with him during the day. He had a normal day yesterday. He has had some occasional nocturnal confusion. His wife states that he has not slept for several days.  He was up during the night using his walker and ambulating to the bathroom. He was speaking to the sitter a few times as well.  This morning. Staff found him verbally unresponsive. He did not respond to " placing ice on him". He had no response to noxious stimuli. He was transferred here.  Upon arrival here he will answer simple questions with yes or no. According family his mental status is "much much different" than it was yesterday. Wife states that he had some coughing fits yesterday. He had an episode where she felt like he was "heaving like he was going to vomit but he didn't. No obvious aspiration episodes or emesis.  New medications at his facility included Zoloft,  tramadol which was started in patient here, and Flexeril at night.  Past Medical History:  Diagnosis Date  . Anxiety    takes Diazepam daily prn  . Arthritis   . Constipation   . Depression    takes Trazodone nightly  . Enlarged prostate   . GERD (gastroesophageal reflux disease)   . H/O hiatal hernia    takes Omeprazole daily  . Hemorrhoids   . History of colitis    many yrs ago  . History of colon polyps   . History of kidney stones    passed on his own  . Hyperlipidemia    was on medication but has been off for a while  . Insomnia    takes Trazodone nightly  . 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 Urinary frequency     Patient Active Problem List   Diagnosis Date Noted  . 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 ACS (acute coronary syndrome) (HCC) 10/01/2016  . ST elevation myocardial infarction (STEMI) (HCC)   . Dysarthria 06/02/2014  . Gastrostomy in place [facility]) 10/11/2013  . Osteomyelitis (HCC) 08/07/2013  . Acute sinusitis 07/25/2013  . Osteomyelitis of skull (HCC) 07/25/2013  . Hyperglycemia 07/25/2013  . Normocytic anemia 07/25/2013  . Dysphagia 07/25/2013  . Protein-calorie malnutrition, severe (HCC) 07/02/2013  . Loss of weight 03/24/2013  . GERD (gastroesophageal reflux disease) 03/24/2013    Past Surgical History:  Procedure Laterality Date  . bilateral cataract surgery    . CARDIAC  SURGERY    . CIRCUMCISION    . COLONOSCOPY    . CORONARY ARTERY BYPASS GRAFT N/A 10/07/2016   Procedure: CORONARY ARTERY BYPASS GRAFTING (CABG) x 3 , using internal mammary, and right greater saphenous vein harvested endoscopically SVG-PD, SVG-OM, LIMA-LAD;  Surgeon: [name], MD;  Location: [facility] OR;  Service: Open Heart Surgery;  Laterality: N/A;  . DIRECT LARYNGOSCOPY N/A 06/26/2013   Procedure: DIRECT LARYNGOSCOPY;  Surgeon: Christia Reading, MD;  Location: [facility] OR;  Service: ENT;  Laterality: N/A;  . EAR CYST EXCISION N/A 04/05/2013   Procedure: CYST REMOVAL;  Surgeon: Melvenia Beam, MD;  Location: [facility] OR;  Service: ENT;  Laterality: N/A;  . ESOPHAGOGASTRODUODENOSCOPY    . ESOPHAGOGASTRODUODENOSCOPY N/A 07/15/2013   Procedure: ESOPHAGOGASTRODUODENOSCOPY (EGD);  Surgeon: Cherylynn Ridges, MD;  Location: [facility] ENDOSCOPY;  Service: General;  Laterality: N/A;  . HEMORRHOIDECTOMY WITH HEMORRHOID BANDING    . HERNIA REPAIR     double  . LEFT HEART CATH AND CORONARY ANGIOGRAPHY N/A 10/01/2016   Procedure: Left Heart Cath and Coronary Angiography;  Surgeon: Lyn Records, MD;  Location: [facility] INVASIVE CV LAB;  Service: Cardiovascular;  Laterality: N/A;  . PEG PLACEMENT N/A 07/15/2013   Procedure: PERCUTANEOUS ENDOSCOPIC GASTROSTOMY (PEG) PLACEMENT;  Surgeon: Cherylynn Ridges, MD;  Location: [facility] ENDOSCOPY;  Service: General;  Laterality: N/A;  . PERIPHERALLY INSERTED CENTRAL CATHETER INSERTION    . right knee arthroscopy    . SEPTOPLASTY N/A 04/05/2013   Procedure: SEPTOPLASTY;  Surgeon: Melvenia Beam, MD;  Location: [facility] OR;  Service: ENT;  Laterality: N/A;  . SINUS ENDO W/FUSION N/A 07/03/2013   Procedure: ENDOSCOPIC SINUS SURGERY WITH FUSION NAVIGATION with By-Nasopharongeal Biopsy;  Surgeon: Christia Reading, MD;  Location: [facility] OR;  Service: ENT;  Laterality: N/A;  . SINUS EXPLORATION Bilateral 07/11/2013   Procedure:  BEDSIDE  SINUS EXPLORATION ;  Surgeon: Christia Reading, MD;  Location: [facility] OR;  Service: ENT;  Laterality: Bilateral;  . TEE WITHOUT CARDIOVERSION N/A 10/07/2016   Procedure: TRANSESOPHAGEAL ECHOCARDIOGRAM (TEE);  Surgeon: [name], MD;  Location: [facility] OR;  Service: Open Heart Surgery;  Laterality: N/A;  . TONSILLECTOMY         Home Medications    Prior to Admission medications   Medication Sig Start Date End Date Taking? Authorizing Provider  aspirin EC 325 MG EC tablet Take 1 tablet (325 mg total) by mouth daily. 10/20/16   Sharlene Dory, PA-C  atorvastatin (LIPITOR) 10 MG tablet Take 1 tablet (10 mg total) by mouth daily at 6 PM. 10/19/16   Sharlene Dory, PA-C  bisacodyl  (DULCOLAX) 10 MG suppository Place 1 suppository (10 mg total) rectally daily. 10/20/16   Sharlene Dory, PA-C  bisacodyl (DULCOLAX) 5 MG EC tablet Take 2 tablets (10 mg total) by mouth daily. 10/20/16   Sharlene Dory, PA-C  cyclobenzaprine (FLEXERIL) 5 MG tablet Take 1 tablet (5 mg total) by mouth at bedtime. 09/29/14   Tonye Pearson, MD  metoprolol tartrate (LOPRESSOR) 25 MG tablet Take 0.5 tablets (12.5 mg total) by mouth 2 (two) times daily. 10/19/16   Sharlene Dory, PA-C  pantoprazole (PROTONIX) 40 MG tablet Take 1 tablet (40 mg total) by mouth daily. 10/20/16   Sharlene Dory, PA-C  polyethylene glycol (MIRALAX / GLYCOLAX) packet Take 17 g by mouth daily. 10/20/16   Sharlene Dory, PA-C  polyvinyl alcohol (LIQUIFILM TEARS) 1.4 % ophthalmic solution Place 2 drops into both eyes as needed for  dry eyes. 10/19/16   Sharlene Dory, PA-C  traMADol (ULTRAM) 50 MG tablet Take 1 tablet (50 mg total) by mouth every 6 (six) hours as needed for moderate pain. 10/19/16   Sharlene Dory, PA-C  UNABLE TO FIND Med Name: Med pass 120 mL by mouth 3 times daily between meals    Historical Provider, MD    Family History Family History  Problem Relation Age of Onset  . CAD Father     Social History Social History  Substance Use Topics  . Smoking status: Former Smoker    Quit date: 07/01/1993  . Smokeless tobacco: Former Neurosurgeon    Types: Chew    Quit date: 12/29/2012     Comment: quit chewing tobacco several months ago and stopped smoking cigars yrs ago  . Alcohol use No     Allergies   Bee pollen; Tetanus toxoids; and Hydrocodone   Review of Systems Review of Systems  Unable to perform ROS: Mental status change   Level V caveat dementia and mental status change  Physical Exam Updated Vital Signs BP 119/79 (BP Location: Right Arm)   Pulse 76   Temp 97.8 F (36.6 C) (Rectal)   Resp 16   Ht  (1.626 m)   Wt 147 lb (66.7 kg)   SpO2 96%   BMI 25.23 kg/m   Physical Exam  Constitutional: He is  oriented to person, place, and time. He appears well-developed and well-nourished. No distress.  Elderly appearing. Eyes closed. Will open them when asked to bit keeps them closed.  HENT:  Head: Normocephalic.  Eyes: Conjunctivae are normal. Pupils are equal, round, and reactive to light. No scleral icterus.  Conjunctiva are not pale. Pupils 3 mm and reactive.  Neck: Normal range of motion. Neck supple. No thyromegaly present.  Cardiovascular: Normal rate and regular rhythm.  Exam reveals no gallop and no friction rub.   No murmur heard. Incisions  appear intact. No increased work of breathing. Not hypoxemic. 98% on 2 L.  Pulmonary/Chest: Effort normal and breath sounds normal. No respiratory distress. He has no wheezes. He has no rales.  Abdominal: Soft. Bowel sounds are normal. He exhibits no distension. There is no tenderness. There is no rebound.  Musculoskeletal: Normal range of motion.  Neurological: He is alert and oriented to person, place, and time.  Answer simple questions. Primarily mumbling speech. His able to tell me his last name and states "I'm cold". No pronator drift. No leg drift. Follows commands.  Skin: Skin is warm and dry. No rash noted.  Psychiatric: He has a normal mood and affect. His behavior is normal.   no peripheral edema   ED Treatments / Results  Labs (all labs ordered are listed, but only abnormal results are displayed) Labs Reviewed  COMPREHENSIVE METABOLIC PANEL - Abnormal; Notable for the following:       Result Value   Glucose, Bld 121 (*)    Calcium 8.5 (*)    Total Protein 6.4 (*)    Albumin 2.9 (*)    Total Bilirubin 1.3 (*)    All other components within normal limits  CBG MONITORING, ED - Abnormal; Notable for the following:    Glucose-Capillary 105 (*)    All other components within normal limits  CULTURE, BLOOD (ROUTINE X 2)  CULTURE, BLOOD (ROUTINE X 2)  URINALYSIS, ROUTINE W REFLEX MICROSCOPIC  I-STAT CG4 LACTIC ACID, ED    EKG   EKG Interpretation  Date/Time:  Wednesday Oct 26 2016 09:27:19 EDT Ventricular Rate:  87 PR Interval:    QRS Duration: 94 QT Interval:  378 QTC Calculation: 455 R Axis:   41 Text Interpretation:  Sinus rhythm Anterior infarct, age indeterminate Confirmed by Fayrene Fearing  MD, Ashan Cueva ([id number]) on 10/26/2016 9:32:43 AM       Radiology Ct Head Wo Contrast  Result Date: 10/26/2016 CLINICAL DATA:  Change in mental status.CABG 4/13 states patient had been very "fidgety for several days and unresponsive today. Patient would open his eye with stimulation EXAM: CT HEAD WITHOUT CONTRAST TECHNIQUE: Contiguous axial images were obtained from the base of the skull through the vertex without intravenous contrast. COMPARISON:  Brain MRI, 08/08/2013.  Head CT, 06/24/2013. FINDINGS: Brain: No evidence of acute infarction, hemorrhage, hydrocephalus, extra-axial collection or mass lesion/mass effect. Vascular: No hyperdense vessel or unexpected calcification. Skull: Normal. Negative for fracture or focal lesion. Sinuses/Orbits: Visualize globes orbits are unremarkable. Visualized sinuses are relatively clear. Clear mastoid air cells. Other: None. IMPRESSION: 1. No acute intracranial abnormalities. Electronically Signed   By: Amie Portland M.D.   On: 10/26/2016 12:52   Dg Chest Port 1 View  Result Date: 10/26/2016 CLINICAL DATA:  Confusion EXAM: PORTABLE CHEST 1 VIEW COMPARISON:  October 12, 2016 FINDINGS: There is consolidation in the left lower lobe. Lungs elsewhere are clear. Heart is upper normal in size with pulmonary vascularity within normal limits. Patient is status post coronary artery bypass grafting. No adenopathy. There is aortic atherosclerosis. No evident bone lesions. IMPRESSION: Left lower lobe consolidation consistent with pneumonia. Lungs elsewhere clear. Stable cardiac silhouette. There is aortic atherosclerosis. Electronically Signed   By: Bretta Bang III M.D.   On: 10/26/2016 09:45     Procedures Procedures (including critical care time)  Medications Ordered in ED Medications  vancomycin (VANCOCIN) 1,250 mg in sodium chloride 0.9 % 250 mL IVPB (1,250 mg Intravenous New Bag/Given 10/26/16 1246)  vancomycin (VANCOCIN) IVPB 1000 mg/200 mL premix (not administered)  piperacillin-tazobactam (ZOSYN) IVPB 3.375 g (0 g Intravenous Stopped 10/26/16 1249)     Initial Impression / Assessment and Plan / ED Course  I have reviewed the triage vital signs and the nursing notes.  Pertinent labs & imaging results that were available during my care of the patient were reviewed by me and considered in my medical decision making (see chart for details).   workup shows left lower lobe infiltrate on chest x-ray. CT head shows no gross abnormality is. Formal read pending. Urine does not appear infected. No leukocytosis.  I discussed the patient with his family including his wife, and his sitter from the facility. This was a rather abrupt change during the night. This may be multifactorial with pneumonia, and multiple new medications in elderly postop patient at Nursing facility. He had blood cultures obtained. He was given vancomycin and Zosyn. I will discussed with hospitalist regarding admission, antibiotics, holding medications, serial exams for improvement.  Final Clinical Impressions(s) / ED Diagnoses   Final diagnoses:  Encephalopathy  Healthcare-associated pneumonia    New Prescriptions New Prescriptions   No medications on file     Rolland Porter, MD 10/26/16 1308

## 2016-10-26 NOTE — ED Notes (Signed)
Wife states patient was admitted to [facility] 4/26 after CABG 4/13 states patient had been very "fidgety for several days , today per staff patient would not respond.  Upon arrival to ed patient lyng on stretcher with eyes closed. Patient would open his eye with stimulation . Then she started crying. Will follow commands. However unable to understand what patient is saying.

## 2016-10-26 NOTE — ED Notes (Signed)
Patient transported to X-ray 

## 2016-10-26 NOTE — Progress Notes (Signed)
Pharmacy Antibiotic Note  Jason [name] is a 83 y.o. male admitted on 10/26/2016 with altered mental status. Starting empiric abx for possible pneumonia, SCr 1, eCrCl 45-50 ml/min.   Plan: Vancomycin 1 IV every 24 hours.  Goal trough 15-20 mcg/mL.  F/u continuation of gram negative coverage Monitor renal fx, cultures, VT as needed   Height:  (162.6 cm) Weight: 147 lb (66.7 kg) IBW/kg (Calculated) : 59.2  Temp (24hrs), Avg:97.8 F (36.6 C), Min:97.8 F (36.6 C), Max:97.8 F (36.6 C)   Recent Labs Lab 10/26/16 0945 10/26/16 1045  CREATININE 1.03  --   LATICACIDVEN  --  1.25    Estimated Creatinine Clearance: 44.7 mL/min (by C-G formula based on SCr of 1.03 mg/dL).    Allergies  Allergen Reactions  . Bee Pollen Anaphylaxis  . Tetanus Toxoids Swelling    SWELLING REACTION UNSPECIFIED ? LOCAL REACTION ?  . Hydrocodone Other (See Comments)    Confusion, "talking out of his head"    Antimicrobials this admission: 5/2 zosyn x1 5/2 vancomycin >  Dose adjustments this admission: N/A  Microbiology results: 5/2 blood cx:  Thank you for allowing pharmacy to be a part of this patient's care.  [name] 10/26/2016 11:48 AM

## 2016-10-26 NOTE — Progress Notes (Signed)
PHARMACY NOTE:  ANTIMICROBIAL RENAL DOSAGE ADJUSTMENT  Current antimicrobial regimen includes a mismatch between antimicrobial dosage and estimated renal function.  As per policy approved by the Pharmacy & Therapeutics and Medical Executive Committees, the antimicrobial dosage will be adjusted accordingly.  Current antimicrobial dosage:  Cefepime 1g IV q 8 hrs  Indication: HCAP  Renal Function:  Estimated Creatinine Clearance: 44.7 mL/min (by C-G formula based on SCr of 1.03 mg/dL).      On intermittent HD, scheduled:      On CRRT    Antimicrobial dosage has been changed to:  Cefepime 1g IV q 24 hrs  Additional comments:   Thank you for allowing pharmacy to be a part of this patient's care.  Tad Moore, BCPS  Clinical Pharmacist Pager [phone]  10/26/2016 9:32 PM

## 2016-10-26 NOTE — ED Notes (Signed)
Patient is opening  His eye more and starting to talk to wife. States he is in the hospital .

## 2016-10-26 NOTE — ED Notes (Signed)
EKG given to Dr. James  

## 2016-10-26 NOTE — Progress Notes (Signed)
CRITICAL VALUE ALERT  Critical value received:  Troponin 0.07  Date of notification:  5/2  Time of notification:  1650  Critical value read back:Yes.    Nurse who received alert:  Lawrence Santiago  MD notified (1st page):  Konrad Dolores  Time of first page:  1700  Responding MD:  Konrad Dolores  Time MD responded:  [phone]

## 2016-10-27 DIAGNOSIS — E43 Unspecified severe protein-calorie malnutrition: Secondary | ICD-10-CM | POA: Diagnosis present

## 2016-10-27 DIAGNOSIS — Z87442 Personal history of urinary calculi: Secondary | ICD-10-CM | POA: Diagnosis not present

## 2016-10-27 DIAGNOSIS — Y95 Nosocomial condition: Secondary | ICD-10-CM | POA: Diagnosis present

## 2016-10-27 DIAGNOSIS — F329 Major depressive disorder, single episode, unspecified: Secondary | ICD-10-CM | POA: Diagnosis present

## 2016-10-27 DIAGNOSIS — Z7982 Long term (current) use of aspirin: Secondary | ICD-10-CM | POA: Diagnosis not present

## 2016-10-27 DIAGNOSIS — I252 Old myocardial infarction: Secondary | ICD-10-CM | POA: Diagnosis not present

## 2016-10-27 DIAGNOSIS — E784 Other hyperlipidemia: Secondary | ICD-10-CM | POA: Diagnosis present

## 2016-10-27 DIAGNOSIS — Z6824 Body mass index (BMI) 24.0-24.9, adult: Secondary | ICD-10-CM | POA: Diagnosis not present

## 2016-10-27 DIAGNOSIS — L0390 Cellulitis, unspecified: Secondary | ICD-10-CM | POA: Diagnosis present

## 2016-10-27 DIAGNOSIS — R5381 Other malaise: Secondary | ICD-10-CM | POA: Diagnosis not present

## 2016-10-27 DIAGNOSIS — D638 Anemia in other chronic diseases classified elsewhere: Secondary | ICD-10-CM | POA: Diagnosis present

## 2016-10-27 DIAGNOSIS — J189 Pneumonia, unspecified organism: Secondary | ICD-10-CM | POA: Diagnosis present

## 2016-10-27 DIAGNOSIS — Z87891 Personal history of nicotine dependence: Secondary | ICD-10-CM | POA: Diagnosis not present

## 2016-10-27 DIAGNOSIS — E7849 Other hyperlipidemia: Secondary | ICD-10-CM

## 2016-10-27 DIAGNOSIS — G9340 Encephalopathy, unspecified: Secondary | ICD-10-CM | POA: Diagnosis present

## 2016-10-27 DIAGNOSIS — Z951 Presence of aortocoronary bypass graft: Secondary | ICD-10-CM | POA: Diagnosis not present

## 2016-10-27 DIAGNOSIS — I2510 Atherosclerotic heart disease of native coronary artery without angina pectoris: Secondary | ICD-10-CM | POA: Diagnosis present

## 2016-10-27 DIAGNOSIS — R471 Dysarthria and anarthria: Secondary | ICD-10-CM | POA: Diagnosis present

## 2016-10-27 DIAGNOSIS — Z91048 Other nonmedicinal substance allergy status: Secondary | ICD-10-CM | POA: Diagnosis not present

## 2016-10-27 DIAGNOSIS — Z887 Allergy status to serum and vaccine status: Secondary | ICD-10-CM | POA: Diagnosis not present

## 2016-10-27 DIAGNOSIS — Z885 Allergy status to narcotic agent status: Secondary | ICD-10-CM | POA: Diagnosis not present

## 2016-10-27 DIAGNOSIS — K219 Gastro-esophageal reflux disease without esophagitis: Secondary | ICD-10-CM | POA: Diagnosis not present

## 2016-10-27 DIAGNOSIS — Z79899 Other long term (current) drug therapy: Secondary | ICD-10-CM | POA: Diagnosis not present

## 2016-10-27 DIAGNOSIS — F419 Anxiety disorder, unspecified: Secondary | ICD-10-CM | POA: Diagnosis present

## 2016-10-27 DIAGNOSIS — Z86010 Personal history of colonic polyps: Secondary | ICD-10-CM | POA: Diagnosis not present

## 2016-10-27 DIAGNOSIS — I10 Essential (primary) hypertension: Secondary | ICD-10-CM | POA: Diagnosis present

## 2016-10-27 LAB — BASIC METABOLIC PANEL
ANION GAP: 11 (ref 5–15)
BUN: 10 mg/dL (ref 6–20)
CALCIUM: 8.5 mg/dL — AB (ref 8.9–10.3)
CO2: 21 mmol/L — AB (ref 22–32)
CREATININE: 1.04 mg/dL (ref 0.61–1.24)
Chloride: 105 mmol/L (ref 101–111)
GFR calc non Af Amer: >60 mL/min (ref >60)
GLUCOSE: 93 mg/dL (ref 65–99)
Potassium: 3.6 mmol/L (ref 3.5–5.1)
Sodium: 137 mmol/L (ref 135–145)

## 2016-10-27 LAB — CBC
HCT: 31.4 % — ABNORMAL LOW (ref 39.0–52.0)
Hemoglobin: 9.7 g/dL — ABNORMAL LOW (ref 13.0–17.0)
MCH: 28.1 pg (ref 26.0–34.0)
MCHC: 30.9 g/dL (ref 30.0–36.0)
MCV: 91 fL (ref 78.0–100.0)
PLATELETS: 456 10*3/uL — AB (ref 150–400)
RBC: 3.45 MIL/uL — AB (ref 4.22–5.81)
RDW: 15.6 % — AB (ref 11.5–15.5)
WBC: 7.5 10*3/uL (ref 4.0–10.5)

## 2016-10-27 LAB — FOLATE RBC
Folate, Hemolysate: >620 ng/mL
HEMATOCRIT: 29.1 % — AB (ref 37.5–51.0)

## 2016-10-27 LAB — RPR: RPR Ser Ql: NONREACTIVE

## 2016-10-27 LAB — STREP PNEUMONIAE URINARY ANTIGEN: Strep Pneumo Urinary Antigen: NEGATIVE

## 2016-10-27 NOTE — Care Management Note (Addendum)
Case Management Note  Patient Details  Name: Jason [name] MRN: [id number] Date of Birth: 23-Oct-1929  Subjective/Objective: [facility]. Pt being treated for Healthcare Associated Pneumonia. Pt continues on IV Vancomycin and IV Cefepime.                      Action/Plan: CM was able to speak with pt in regards to disposition- he wanted me to call his wife. CM did speak with wife and she has concerns with [facility]. She would like another facility. Wife had questions in regards to CIR as well. CM did state that PT recommendations for SNF @ this time and pt is Observation Status.  CM did state to wife that she will reach out to CSW in regards to Facility. CM did speak with Morrie SheldonAshley. CM will continue to monitor for additional needs.   Expected Discharge Date:                  Expected Discharge Plan:  Skilled Nursing Facility  In-House Referral:  Clinical Social Work  Discharge planning Services  CM Consult  Post Acute Care Choice:    Choice offered to:     DME Arranged:    DME Agency:     HH Arranged:    HH Agency:     Status of Service:  In process, will continue to follow  If discussed at Long Length of Stay Meetings, dates discussed:    Additional Comments:  Gala LewandowskyGraves-Bigelow, Abrielle Finck Kaye, RN 10/27/2016, 12:08 PM

## 2016-10-27 NOTE — Evaluation (Signed)
Occupational Therapy Evaluation Patient Details Name: Jason [name] MRN: [id number] DOB: [birth year] Today's Date: 10/27/2016    History of Present Illness 83 y.o. male with medical history significant for CAD status post CABG April 13 of this year, GERD, hyperlipidemia, dysarthria, and anemia. [facility] he has [facility] since d/c from hospital 4/26) with chief complaint of altered mental status. Initial evaluation includes chest x-ray concerning for pneumonia.   Clinical Impression   Pt currently requiring min assist for ADL and min guard for functional mobility. Pt requires cues throughout all functional activities to maintain sternal precautions. Recommending return to SNF for continued rehab prior to return home as pt reports his wife is unable to assist physically. Pt would benefit from continued skilled OT to address established goals.    Follow Up Recommendations  SNF    Equipment Recommendations  Other (comment) (TBD at next venue)    Recommendations for Other Services       Precautions / Restrictions Precautions Precautions: Fall;Sternal Precaution Comments: Reviewed sternal precautions. Restrictions Weight Bearing Restrictions: No Other Position/Activity Restrictions: sternal precautions      Mobility Bed Mobility Overal bed mobility: Needs Assistance Bed Mobility: Rolling;Sidelying to Sit Rolling: Supervision Sidelying to sit: Supervision       General bed mobility comments: Cues for sequencing and technique  Transfers Overall transfer level: Needs assistance Equipment used: Rolling walker (2 wheeled) Transfers: Sit to/from Stand Sit to Stand: Min guard         General transfer comment: Cues for hand placement and maintaining sternal precautions    Balance Overall balance assessment: Needs assistance Sitting-balance support: Feet supported;No upper extremity supported Sitting balance-Leahy Scale: Good      Standing balance support: No upper extremity supported;During functional activity Standing balance-Leahy Scale: Fair                             ADL either performed or assessed with clinical judgement   ADL Overall ADL's : Needs assistance/impaired Eating/Feeding: Set up;Sitting   Grooming: Min guard;Standing   Upper Body Bathing: Minimal assistance;Sitting   Lower Body Bathing: Minimal assistance;Sit to/from stand   Upper Body Dressing : Minimal assistance;Sitting   Lower Body Dressing: Minimal assistance;Sit to/from stand Lower Body Dressing Details (indicate cue type and reason): Able to cross foot over opposite knee to adjust socks Toilet Transfer: Min guard;Ambulation;RW   Toileting- ArchitectClothing Manipulation and Hygiene: Min guard;Sit to/from stand       Functional mobility during ADLs: Min guard;Rolling walker General ADL Comments: Pt requires cues throghout functional activities to maintain sternal precautions     Vision         Perception     Praxis      Pertinent Vitals/Pain Pain Assessment: No/denies pain Pain Score: 0-No pain     Hand Dominance Right   Extremity/Trunk Assessment Upper Extremity Assessment Upper Extremity Assessment: Overall WFL for tasks assessed   Lower Extremity Assessment Lower Extremity Assessment: Defer to PT evaluation       Communication Communication Communication: No difficulties   Cognition Arousal/Alertness: Awake/alert Behavior During Therapy: WFL for tasks assessed/performed Overall Cognitive Status: Within Functional Limits for tasks assessed                                     General Comments  Exercises     Shoulder Instructions      Home Living Family/patient expects to be discharged to:: Private residence Living Arrangements: Spouse/significant other Available Help at Discharge: Other (Comment) (wife unable to provide physical assist) Type of Home: House Home Access:  Level entry     Home Layout: Two level;Able to live on main level with bedroom/bathroom Alternate Level Stairs-Number of Steps: one step out to garage   Bathroom Shower/Tub: Producer, television/film/video: Handicapped height     Home Equipment: Environmental consultant - 2 wheels;Cane - single point;Shower seat - built in   Additional Comments: Prior to previous hospitalization (09/2016) pt liveed at home with wife who is there 24/7, however, she is unable to provide assist.       Prior Functioning/Environment Level of Independence: Independent        Comments: pt was driving and was primary caretaker of wife prior to 09/2016.        OT Problem List: Decreased strength;Decreased activity tolerance;Impaired balance (sitting and/or standing);Decreased knowledge of use of DME or AE;Decreased knowledge of precautions;Cardiopulmonary status limiting activity      OT Treatment/Interventions: Self-care/ADL training;Energy conservation;DME and/or AE instruction;Therapeutic activities;Patient/family education;Balance training    OT Goals(Current goals can be found in the care plan section) Acute Rehab OT Goals Patient Stated Goal: get better OT Goal Formulation: With patient Time For Goal Achievement: 11/10/16 Potential to Achieve Goals: Good ADL Goals Pt Will Perform Upper Body Bathing: with modified independence;sitting Pt Will Perform Lower Body Bathing: with modified independence;sit to/from stand Pt Will Perform Upper Body Dressing: with modified independence;sitting Pt Will Perform Lower Body Dressing: with modified independence;sit to/from stand Pt Will Transfer to Toilet: with modified independence;ambulating;regular height toilet Pt Will Perform Toileting - Clothing Manipulation and hygiene: with modified independence;sit to/from stand Pt Will Perform Tub/Shower Transfer: Shower transfer;with modified independence;ambulating;rolling walker  OT Frequency: Min 2X/week   Barriers to D/C:  Decreased caregiver support  wife unable to assist physically       Co-evaluation              AM-PAC PT "6 Clicks" Daily Activity     Outcome Measure Help from another person eating meals?: None Help from another person taking care of personal grooming?: A Little Help from another person toileting, which includes using toliet, bedpan, or urinal?: A Little Help from another person bathing (including washing, rinsing, drying)?: A Little Help from another person to put on and taking off regular upper body clothing?: A Little Help from another person to put on and taking off regular lower body clothing?: A Little 6 Click Score: 19   End of Session Equipment Utilized During Treatment: Rolling walker Nurse Communication: Mobility status  Activity Tolerance: Patient tolerated treatment well Patient left: in chair;with call bell/phone within reach  OT Visit Diagnosis: Unsteadiness on feet (R26.81);Muscle weakness (generalized) (M62.81)                Time: 9528-4132 OT Time Calculation (min): 16 min Charges:  OT General Charges $OT Visit: 1 Procedure OT Evaluation $OT Eval Moderate Complexity: 1 Procedure G-Codes: OT G-codes **NOT FOR INPATIENT CLASS** Functional Assessment Tool Used: AM-PAC 6 Clicks Daily Activity Functional Limitation: Self care Self Care Current Status (G4010): At least 40 percent but less than 60 percent impaired, limited or restricted Self Care Goal Status (U7253): At least 1 percent but less than 20 percent impaired, limited or restricted   Jason Mare A. Brett Albino, M.S., OTR/L Pager: [phone]   Jason Mare  A Zackeriah [name] 10/27/2016, 3:27 PM

## 2016-10-27 NOTE — Progress Notes (Addendum)
[facility] PROGRESS NOTE  Jason [name] ZOX:[id number] DOB: Dec 05, 1929 DOA: 10/26/2016   PCP: Minda Meo, MD     Assessment/Plan: Principal Problem:   Acute encephalopathy Active Problems:   GERD (gastroesophageal reflux disease)   Protein-calorie malnutrition, severe (HCC)   HCAP (healthcare-associated pneumonia)   Other hyperlipidemia   83 y.o. male with medical history significant for CAD status post CABG April 13 of this year, GERD, hyperlipidemia, dysarthria, anemia, protein calorie malnutrition persists emergency Department chief complaint altered mental status. Initial evaluation includes chest x-ray concerning for pneumonia.  Assessment and plan #1. Acute encephalopathy. Likely multifactorial specifically infection (HCAP) versus pharmacy versus dehydration vs hypoxia. CT of the head without acute abnormalities. Chest x-ray left lower lobe consolidation consistent with pneumonia. Urinalysis unremarkable, CBG 105 lactic acid within the limits of normal, arterial blood gas with a pH of 7.48, PCO2 29.2 PO2 75. Oxygen saturation level greater than 90% on room air Continue telemetry -Follow blood cultures -Antibiotics per protocol -Strep pneumo urine antigen -Hold sedating medications Ammonia within normal limits, vitamin B-12 within normal limits, RPR nonreactive    #2. Healthcare associated pneumonia. Chest x-ray as noted above. Patient's afebrile nontoxic appearing. Hemodynamically stable -Antibiotics per protocol -Follow blood cultures -Sputum culture as able -Strep pneumo urine antigen Oxygenation stable, 96% on room air at rest He is s/p CABG and his at high risk for decompensation if not treated as an inpatient     #3.HX recent STEMI s/p cabg. denies chest pain. EKG sinus rhythm as noted above. Patient denies chest pain. Home medications include aspirin Lipitor -Continue aspirin , Lopressor and Lipitor -Monitor on telemetry  #4.  Hypertension. Fair control in the emergency department -Continue beta blocker  #5. Dysarthria.  -appears stable at baseline -speech eval    #6 Cellulitis Mild erythema to graft site. Improving, status posttreatment with  keflex 500 mg tid until 10/23/16.   #7 anemia-anemia of chronic disease, hemoglobin stable  DVT prophylaxsis Lovenox  Code Status:  Partial code     Family Communication: Discussed in detail with the patient, all imaging results, lab results explained to the patient   Disposition Plan:  PT OT consultation, anticipate discharge in one to 2 days      Consultants:  None  Procedures:  None  Antibiotics: Anti-infectives    Start     Dose/Rate Route Frequency Ordered Stop   10/27/16 1651  ceFEPIme (MAXIPIME) 1 g in dextrose 5 % 50 mL IVPB     1 g 100 mL/hr over 30 Minutes Intravenous Every 24 hours 10/26/16 2129     10/27/16 1300  vancomycin (VANCOCIN) IVPB 1000 mg/200 mL premix  Status:  Discontinued     1,000 mg 200 mL/hr over 60 Minutes Intravenous Every 24 hours 10/26/16 1151 10/26/16 1322   10/27/16 1300  vancomycin (VANCOCIN) IVPB 1000 mg/200 mL premix     1,000 mg 200 mL/hr over 60 Minutes Intravenous Every 24 hours 10/26/16 1332     10/26/16 1400  ceFEPIme (MAXIPIME) 1 g in dextrose 5 % 50 mL IVPB  Status:  Discontinued     1 g 100 mL/hr over 30 Minutes Intravenous Every 8 hours 10/26/16 1322 10/26/16 2129   10/26/16 1300  vancomycin (VANCOCIN) 1,250 mg in sodium chloride 0.9 % 250 mL IVPB     1,250 mg 166.7 mL/hr over 90 Minutes Intravenous  Once 10/26/16 1151 10/26/16 1416   10/26/16 1200  piperacillin-tazobactam (ZOSYN) IVPB 3.375 g  3.375 g 100 mL/hr over 30 Minutes Intravenous  Once 10/26/16 1145 10/26/16 1249         HPI/Subjective: Patient says that his cough which she had 2 days prior to admission has gone, shortness of breath is better  Objective: Vitals:   10/26/16 1452 10/26/16 2021 10/27/16 0024 10/27/16 0537  BP:  138/65 (!) 141/67 (!) 117/57 (!) 154/74  Pulse: 79   82  Resp: 18 16 18 18   Temp: 97.7 F (36.5 C) 98.4 F (36.9 C)  97.8 F (36.6 C)  TempSrc: Oral Oral  Oral  SpO2: 96% (!) 16% 96% 96%  Weight:      Height:        Intake/Output Summary (Last 24 hours) at 10/27/16 1015 Last data filed at 10/27/16 0300  Gross per 24 hour  Intake           314.17 ml  Output              375 ml  Net           -60.83 ml    Exam:  Examination:  General exam: Appears calm and comfortable  Respiratory system: Clear to auscultation. Respiratory effort normal. Cardiovascular system: S1 & S2 heard, RRR. No JVD, murmurs, rubs, gallops or clicks. No pedal edema. Gastrointestinal system: Abdomen is nondistended, soft and nontender. No organomegaly or masses felt. Normal bowel sounds heard. Central nervous system: Alert and oriented. No focal neurological deficits. Extremities: Symmetric 5 x 5 power. Skin: No rashes, lesions or ulcers Psychiatry: Judgement and insight appear normal. Mood & affect appropriate.     Data Reviewed: I have personally reviewed following labs and imaging studies  Micro Results Recent Results (from the past 240 hour(s))  Culture, blood (Routine X 2) w Reflex to ID Panel     Status: None (Preliminary result)   Collection Time: 10/26/16 11:45 AM  Result Value Ref Range Status   Specimen Description BLOOD RIGHT ANTECUBITAL  Final   Special Requests   Final    BOTTLES DRAWN AEROBIC AND ANAEROBIC Blood Culture adequate volume   Culture NO GROWTH < 24 HOURS  Final   Report Status PENDING  Incomplete  Culture, blood (Routine X 2) w Reflex to ID Panel     Status: None (Preliminary result)   Collection Time: 10/26/16 11:50 AM  Result Value Ref Range Status   Specimen Description BLOOD RIGHT HAND  Final   Special Requests   Final    BOTTLES DRAWN AEROBIC ONLY Blood Culture adequate volume   Culture NO GROWTH < 24 HOURS  Final   Report Status PENDING  Incomplete     Radiology Reports Ct Head Wo Contrast  Result Date: 10/26/2016 CLINICAL DATA:  Change in mental status.CABG 4/13 states patient had been very "fidgety for several days and unresponsive today. Patient would open his eye with stimulation EXAM: CT HEAD WITHOUT CONTRAST TECHNIQUE: Contiguous axial images were obtained from the base of the skull through the vertex without intravenous contrast. COMPARISON:  Brain MRI, 08/08/2013.  Head CT, 06/24/2013. FINDINGS: Brain: No evidence of acute infarction, hemorrhage, hydrocephalus, extra-axial collection or mass lesion/mass effect. Vascular: No hyperdense vessel or unexpected calcification. Skull: Normal. Negative for fracture or focal lesion. Sinuses/Orbits: Visualize globes orbits are unremarkable. Visualized sinuses are relatively clear. Clear mastoid air cells. Other: None. IMPRESSION: 1. No acute intracranial abnormalities. Electronically Signed   By: Amie Portland M.D.   On: 10/26/2016 12:52   Dg Chest [facility] 1 View  Result  Date: 10/26/2016 CLINICAL DATA:  Confusion EXAM: PORTABLE CHEST 1 VIEW COMPARISON:  October 12, 2016 FINDINGS: There is consolidation in the left lower lobe. Lungs elsewhere are clear. Heart is upper normal in size with pulmonary vascularity within normal limits. Patient is status post coronary artery bypass grafting. No adenopathy. There is aortic atherosclerosis. No evident bone lesions. IMPRESSION: Left lower lobe consolidation consistent with pneumonia. Lungs elsewhere clear. Stable cardiac silhouette. There is aortic atherosclerosis. Electronically Signed   By: Bretta Bang III M.D.   On: 10/26/2016 09:45   Dg Chest Port 1 View  Result Date: 10/12/2016 CLINICAL DATA:  Status post cardiac surgery EXAM: PORTABLE CHEST 1 VIEW COMPARISON:  10/10/2016 FINDINGS: Cardiac shadow is stable. Postsurgical changes are again noted. Persistent left basilar changes are noted with new small pleural effusion. Fullness in the right peritracheal  region is noted related to patient rotation to the right. No other focal abnormality is seen. IMPRESSION: Slight increase in left basilar atelectasis/ infiltrate and small left effusion. Electronically Signed   By: Alcide Clever M.D.   On: 10/12/2016 07:58   Dg Chest Port 1 View  Result Date: 10/10/2016 CLINICAL DATA:  Status post coronary artery bypass graft. EXAM: PORTABLE CHEST 1 VIEW COMPARISON:  Radiograph of October 09, 2016. FINDINGS: Stable cardiomediastinal silhouette. Sternotomy wires are noted. No pneumothorax is noted. Stable position of right internal jugular venous sheath. Right lung is clear. Mild left basilar subsegmental atelectasis is noted. Bony thorax is unremarkable. IMPRESSION: Mild left basilar subsegmental atelectasis. Status post coronary artery bypass graft. Electronically Signed   By: Lupita Raider, M.D.   On: 10/10/2016 07:17   Dg Chest Port 1 View  Result Date: 10/09/2016 CLINICAL DATA:  CABG, postop day 2. EXAM: PORTABLE CHEST 1 VIEW COMPARISON:  10/08/2016 and prior exams FINDINGS: Cardiomegaly, CABG changes and right IJ central venous catheter sheath again noted. There has been interval removal of a Swan-Ganz catheter and mediastinal and left thoracostomy tubes. Bibasilar atelectasis again noted. There is no evidence of pulmonary edema or pneumothorax. IMPRESSION: Support apparatus removal otherwise unchanged appearance of the chest. No evidence of pneumothorax. Electronically Signed   By: Harmon Pier M.D.   On: 10/09/2016 07:31   Dg Chest Port 1 View  Result Date: 10/08/2016 CLINICAL DATA:  Postop day 1 CABG. EXAM: PORTABLE CHEST 1 VIEW COMPARISON:  One-view chest x-ray 10/07/2016 FINDINGS: The patient has been extubated. Left-sided chest tube and mediastinal drain remain. The tip of the Swan-Ganz catheter is in the proximal right pulmonary artery. There is no pneumothorax. A left pleural effusion remains. Left basilar airspace disease is decreased, likely reflecting  atelectasis. Small right pleural effusion is noted. IMPRESSION: 1. Interval extubation. 2. Decreasing left pleural effusion and associated atelectasis. 3. Small right pleural effusion. Electronically Signed   By: Marin Roberts M.D.   On: 10/08/2016 07:37   Dg Chest Port 1 View  Result Date: 10/07/2016 CLINICAL DATA:  83 year old male with a history of previous CABG EXAM: PORTABLE CHEST 1 VIEW COMPARISON:  10/01/2016, 08/13/2014 FINDINGS: Interval surgical changes of median sternotomy and CABG. Interval placement of endotracheal tube, which terminates suitably above the carina, approximately 2.7 cm. Interval placement of right IJ sheath through which a Swan-Ganz catheter terminates in the right pulmonary artery. Interval placement of gastric tube, terminating below the diaphragm out of the field of view. Interval placement of mediastinal drains and left thoracostomy tube. Low lung volumes. Opacity at the left lung base partially obscuring the left hemidiaphragm and  left heart border. No pneumothorax. IMPRESSION: Early postop changes of median sternotomy and CABG, with opacity at the left lung base, likely a combination of atelectasis and small pleural fluid. No visualized pneumothorax. Surgical apparatus includes endotracheal tube, gastric tube, right IJ sheath/Swan-Ganz, mediastinal/ pleural drains, as above. Electronically Signed   By: Gilmer MorJaime  Wagner D.O.   On: 10/07/2016 14:02   Dg Chest Portable 1 View  Result Date: 10/01/2016 CLINICAL DATA:  Myocardial infarction EXAM: PORTABLE CHEST 1 VIEW COMPARISON:  August 13, 2014 FINDINGS: The heart size and mediastinal contours are within normal limits. Both lungs are clear. The visualized skeletal structures are unremarkable. IMPRESSION: No active disease. Electronically Signed   By: Gerome Samavid  Williams III M.D   On: 10/01/2016 17:21   Dg Abd Portable 1v  Result Date: 10/10/2016 CLINICAL DATA:  CABG.  Nausea.  No bowel movement. EXAM: PORTABLE ABDOMEN -  1 VIEW COMPARISON:  07/05/2013.  CT 05/06/2013. FINDINGS: Soft tissue structures are unremarkable. No bowel distention. Stool noted throughout the colon. No free air. Degenerative changes lumbar spine and both hips. IMPRESSION: No acute abnormality identified. No bowel distention. Stool noted throughout the colon. Electronically Signed   By: Maisie Fushomas  Register   On: 10/10/2016 10:18     CBC  Recent Labs Lab 10/26/16 1513 10/27/16 0147  WBC 5.9 7.5  HGB 9.8* 9.7*  HCT 31.2* 31.4*  PLT 454* 456*  MCV 91.2 91.0  MCH 28.7 28.1  MCHC 31.4 30.9  RDW 15.6* 15.6*    Chemistries   Recent Labs Lab 10/26/16 0945 10/26/16 1513 10/27/16 0147  NA 137  --  137  K 4.0  --  3.6  CL 107  --  105  CO2 22  --  21*  GLUCOSE 121*  --  93  BUN 14  --  10  CREATININE 1.03  --  1.04  CALCIUM 8.5*  --  8.5*  MG  --  2.1  --   AST 25  --   --   ALT 28  --   --   ALKPHOS 107  --   --   BILITOT 1.3*  --   --    ------------------------------------------------------------------------------------------------------------------ estimated creatinine clearance is 44.3 mL/min (by C-G formula based on SCr of 1.04 mg/dL). ------------------------------------------------------------------------------------------------------------------ No results for input(s): HGBA1C in the last 72 hours. ------------------------------------------------------------------------------------------------------------------ No results for input(s): CHOL, HDL, LDLCALC, TRIG, CHOLHDL, LDLDIRECT in the last 72 hours. ------------------------------------------------------------------------------------------------------------------ No results for input(s): TSH, T4TOTAL, T3FREE, THYROIDAB in the last 72 hours.  Invalid input(s): FREET3 ------------------------------------------------------------------------------------------------------------------  Recent Labs  10/26/16 1513  VITAMINB12 769    Coagulation profile No results  for input(s): INR, PROTIME in the last 168 hours.  No results for input(s): DDIMER in the last 72 hours.  Cardiac Enzymes  Recent Labs Lab 10/26/16 1513  TROPONINI 0.07*   ------------------------------------------------------------------------------------------------------------------ Invalid input(s): POCBNP   CBG:  Recent Labs Lab 10/26/16 0951  GLUCAP 105*       Studies: Ct Head Wo Contrast  Result Date: 10/26/2016 CLINICAL DATA:  Change in mental status.CABG 4/13 states patient had been very "fidgety for several days and unresponsive today. Patient would open his eye with stimulation EXAM: CT HEAD WITHOUT CONTRAST TECHNIQUE: Contiguous axial images were obtained from the base of the skull through the vertex without intravenous contrast. COMPARISON:  Brain MRI, 08/08/2013.  Head CT, 06/24/2013. FINDINGS: Brain: No evidence of acute infarction, hemorrhage, hydrocephalus, extra-axial collection or mass lesion/mass effect. Vascular: No hyperdense vessel or unexpected calcification. Skull:  Normal. Negative for fracture or focal lesion. Sinuses/Orbits: Visualize globes orbits are unremarkable. Visualized sinuses are relatively clear. Clear mastoid air cells. Other: None. IMPRESSION: 1. No acute intracranial abnormalities. Electronically Signed   By: Amie Portland M.D.   On: 10/26/2016 12:52   Dg Chest Port 1 View  Result Date: 10/26/2016 CLINICAL DATA:  Confusion EXAM: PORTABLE CHEST 1 VIEW COMPARISON:  October 12, 2016 FINDINGS: There is consolidation in the left lower lobe. Lungs elsewhere are clear. Heart is upper normal in size with pulmonary vascularity within normal limits. Patient is status post coronary artery bypass grafting. No adenopathy. There is aortic atherosclerosis. No evident bone lesions. IMPRESSION: Left lower lobe consolidation consistent with pneumonia. Lungs elsewhere clear. Stable cardiac silhouette. There is aortic atherosclerosis. Electronically Signed   By:  Bretta Bang III M.D.   On: 10/26/2016 09:45      Lab Results  Component Value Date   HGBA1C 5.3 10/01/2016   Lab Results  Component Value Date   LDLCALC 103 (H) 10/02/2016   CREATININE 1.04 10/27/2016       Scheduled Meds: . aspirin  325 mg Oral Daily  . atorvastatin  10 mg Oral q1800  . bisacodyl  10 mg Oral Daily  . bisacodyl  10 mg Rectal Daily  . docusate sodium  100 mg Oral BID  . enoxaparin (LOVENOX) injection  40 mg Subcutaneous Q24H  . feeding supplement (ENSURE ENLIVE)  237 mL Oral TID BM  . metoprolol tartrate  12.5 mg Oral BID  . pantoprazole  40 mg Oral Daily  . polyethylene glycol  17 g Oral QHS  . sodium chloride flush  3 mL Intravenous Q12H   Continuous Infusions: . ceFEPime (MAXIPIME) IV    . vancomycin       LOS: 0 days    Time spent: >30 MINS    Richarda Overlie  [facility] Pager [phone]. If 7PM-7AM, please contact night-coverage at [email or web address], password [facility] 10/27/2016, 10:15 AM  LOS: 0 days

## 2016-10-27 NOTE — Progress Notes (Addendum)
Nutrition Brief Note  Patient identified on the Malnutrition Screening Tool (MST) Report.  Wt Readings from Last 15 Encounters:  10/26/16 147 lb (66.7 kg)  10/21/16 147 lb (66.7 kg)  10/20/16 147 lb (66.7 kg)  02/11/16 149 lb 3.2 oz (67.7 kg)  03/10/15 154 lb (69.9 kg)  09/29/14 158 lb 9.6 oz (71.9 kg)  08/13/14 159 lb 3.2 oz (72.2 kg)  02/06/14 150 lb (68 kg)  10/11/13 151 lb 6.4 oz (68.7 kg)  09/12/13 138 lb (62.6 kg)  08/29/13 136 lb (61.7 kg)  08/10/13 136 lb 3.9 oz (61.8 kg)  08/07/13 133 lb (60.3 kg)  07/25/13 125 lb (56.7 kg)  07/18/13 125 lb 11.2 oz (57 kg)   Body mass index is 25.23 kg/m. Patient meets criteria for Overweight based on current BMI.   Current diet order is Heart Healthy/Carbohydrate Modified, patient is consuming approximately 75% of meals at this time.   Labs and medications reviewed.   No nutrition interventions warranted at this time. If nutrition issues arise, please consult RD.   Maureen ChattersKatie Jena Tegeler, RD, LDN Pager #: [phone] After-Hours Pager #: [phone]

## 2016-10-27 NOTE — Care Management Obs Status (Signed)
MEDICARE OBSERVATION STATUS NOTIFICATION   Patient Details  Name: Jason [name] MRN: [id number] Date of Birth: 1930/02/08   Medicare Observation Status Notification Given:  Yes    Gala LewandowskyGraves-Bigelow, Maribeth Jiles Kaye, RN 10/27/2016, 12:07 PM

## 2016-10-27 NOTE — Evaluation (Signed)
Clinical/Bedside Swallow Evaluation Patient Details  Name: Jason [name] MRN: [id number] Date of Birth: Feb 13, 1930  Today's Date: 10/27/2016 Time: SLP Start Time (ACUTE ONLY): 1405 SLP Stop Time (ACUTE ONLY): 1425 SLP Time Calculation (min) (ACUTE ONLY): 20 min  Past Medical History:  Past Medical History:  Diagnosis Date  . Anxiety    takes Diazepam daily prn  . Arthritis   . Constipation   . Coronary artery disease   . Depression    takes Trazodone nightly  . Dysarthria   . Enlarged prostate   . GERD (gastroesophageal reflux disease)   . H/O hiatal hernia    takes Omeprazole daily  . Hemorrhoids   . History of colitis    many yrs ago  . History of colon polyps   . History of kidney stones    passed on his own  . Hyperlipidemia    was on medication but has been off for a while  . Insomnia    takes Trazodone nightly  . 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 Pneumonia 10/26/2016  . Protein calorie malnutrition (HCC) 10/2016  . STEMI (ST elevation myocardial infarction) (HCC)   . Urinary frequency    Past Surgical History:  Past Surgical History:  Procedure Laterality Date  . bilateral cataract surgery    . CARDIAC SURGERY    . CIRCUMCISION    . COLONOSCOPY    . CORONARY ARTERY BYPASS GRAFT N/A 10/07/2016   Procedure: CORONARY ARTERY BYPASS GRAFTING (CABG) x 3 , using internal mammary, and right greater saphenous vein harvested endoscopically SVG-PD, SVG-OM, LIMA-LAD;  Surgeon: [name], MD;  Location: [facility] OR;  Service: Open Heart Surgery;  Laterality: N/A;  . DIRECT LARYNGOSCOPY N/A 06/26/2013   Procedure: DIRECT LARYNGOSCOPY;  Surgeon: Christia Reading, MD;  Location: [facility] OR;  Service: ENT;  Laterality: N/A;  . EAR CYST EXCISION N/A 04/05/2013   Procedure: CYST REMOVAL;  Surgeon: Melvenia Beam, MD;  Location: [facility] OR;  Service: ENT;  Laterality: N/A;  . ESOPHAGOGASTRODUODENOSCOPY    . ESOPHAGOGASTRODUODENOSCOPY N/A 07/15/2013   Procedure:  ESOPHAGOGASTRODUODENOSCOPY (EGD);  Surgeon: Cherylynn Ridges, MD;  Location: [facility] ENDOSCOPY;  Service: General;  Laterality: N/A;  . HEMORRHOIDECTOMY WITH HEMORRHOID BANDING    . HERNIA REPAIR     double  . LEFT HEART CATH AND CORONARY ANGIOGRAPHY N/A 10/01/2016   Procedure: Left Heart Cath and Coronary Angiography;  Surgeon: Lyn Records, MD;  Location: [facility] INVASIVE CV LAB;  Service: Cardiovascular;  Laterality: N/A;  . PEG PLACEMENT N/A 07/15/2013   Procedure: PERCUTANEOUS ENDOSCOPIC GASTROSTOMY (PEG) PLACEMENT;  Surgeon: Cherylynn Ridges, MD;  Location: [facility] ENDOSCOPY;  Service: General;  Laterality: N/A;  . PERIPHERALLY INSERTED CENTRAL CATHETER INSERTION    . right knee arthroscopy    . SEPTOPLASTY N/A 04/05/2013   Procedure: SEPTOPLASTY;  Surgeon: Melvenia Beam, MD;  Location: [facility] OR;  Service: ENT;  Laterality: N/A;  . SINUS ENDO W/FUSION N/A 07/03/2013   Procedure: ENDOSCOPIC SINUS SURGERY WITH FUSION NAVIGATION with By-Nasopharongeal Biopsy;  Surgeon: Christia Reading, MD;  Location: [facility] OR;  Service: ENT;  Laterality: N/A;  . SINUS EXPLORATION Bilateral 07/11/2013   Procedure:  BEDSIDE  SINUS EXPLORATION ;  Surgeon: Christia Reading, MD;  Location: [facility] OR;  Service: ENT;  Laterality: Bilateral;  . TEE WITHOUT CARDIOVERSION N/A 10/07/2016   Procedure: TRANSESOPHAGEAL ECHOCARDIOGRAM (TEE);  Surgeon: [name], MD;  Location[facility] OR;  Service: Open Heart Surgery;  Laterality: N/A;  . TONSILLECTOMY  HPI:  83 y.o. male with medical history significant for CAD status post CABG April 13 of this year, GERD, hyperlipidemia, dysarthria, and anemia. [facility]where he has [facility] since d/c from hospital 4/26) with chief complaint of altered mental status. 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 At that time he had a MBS and diet of Dys 1, thin liquids  was recommended.    Assessment / Plan / Recommendation Clinical Impression  Nurse was present when entering room and reported patient has not had any swallowing difficulties, but is giving patient's medication in applesauce per patients request. Pt was able to report his history of swallowing difficulties and the strategies he has been using to swallow safely. Pt was presented with water, applesauce and cracker. He chews and transfers solids on his right side (left side of tongue continues to have reduced sensation) followed by multiple swallows. Pt exhibited no s/s of aspiration. He did report difficulty chewing meats. Recommend a Dys 3, thin liquid diet continuing with a multiple swallow. ST to follow up and assure diet tolerance.  SLP Visit Diagnosis: Dysphagia, oral phase (R13.11)    Aspiration Risk  Mild aspiration risk    Diet Recommendation          Other  Recommendations Oral Care Recommendations: Oral care BID     Swallow Study   General Date of Onset: 10/26/16 HPI: 83 y.o. male with medical history significant for CAD status post CABG April 13 of this year, GERD, hyperlipidemia, dysarthria, and anemia. [facility] (where he has resided since d/c from hospital 4/26) with chief complaint of altered mental status. 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 At that time he had a MBS and diet of Dys 1, thin liquids was recommended.  Type of Study: Bedside Swallow Evaluation Previous Swallow Assessment: 07/2013 Diet Prior to this Study: Dysphagia 3 (soft);Thin liquids Temperature Spikes Noted: No Respiratory Status: Room air History of Recent Intubation: No Behavior/Cognition: Alert;Cooperative;Pleasant mood Oral Cavity Assessment: Within Functional Limits Oral Care Completed by SLP: No Oral Cavity - Dentition: Adequate natural  dentition Vision: Functional for self-feeding Self-Feeding Abilities: Able to feed self Patient Positioning: Upright in bed Baseline Vocal Quality: Normal Volitional Cough: Strong Volitional Swallow: Able to elicit    Oral/Motor/Sensory Function Overall Oral Motor/Sensory Function: Moderate impairment Facial ROM: Within Functional Limits Facial Symmetry: Within Functional Limits Facial Strength: Within Functional Limits Facial Sensation: Within Functional Limits Lingual ROM: Reduced left Lingual Symmetry: Abnormal symmetry left Lingual Strength: Reduced Lingual Sensation: Reduced   Ice Chips Ice chips: Not tested Presentation: Cup   Thin Liquid Thin Liquid: Within functional limits Presentation: Cup;Straw    Nectar Thick Nectar Thick Liquid: Not tested   Honey Thick Honey Thick Liquid: Not tested   Puree Puree: Within functional limits Presentation: Self Fed   Solid   GO   Solid: Impaired Oral Phase Impairments: Reduced lingual movement/coordination;Poor awareness of bolus Oral Phase Functional Implications: Impaired mastication    Functional Assessment Tool Used: Bedside swallow evaluation  Lindalou HoseSarah J. Skylor Schnapp, MA, CCC-SLP 10/27/2016 2:38 PM

## 2016-10-27 NOTE — Progress Notes (Signed)
      [address]       [place] [id number]             [phone]         Subjective: Patient states he does not [facility].   Objective: Vital signs in last 24 hours: Temp:  [97.7 F (36.5 C)-98.4 F (36.9 C)] 97.8 F (36.6 C) (05/03 0537) Pulse Rate:  [74-82] 82 (05/03 0537) Cardiac Rhythm: Normal sinus rhythm (05/03 0700) Resp:  [16-27] 18 (05/03 0537) BP: (117-154)/(57-86) 154/74 (05/03 0537) SpO2:  [16 %-96 %] 96 % (05/03 0537) Weight:  [66.7 kg (147 lb)] 66.7 kg (147 lb) (05/02 0956)    Intake/Output from previous day: 05/02 0701 - 05/03 0700 In: 314.2 [P.O.:120; I.V.:144.2; IV Piggyback:50] Out: 375 [Urine:375] Intake/Output this shift: No intake/output data recorded.  General appearance: cooperative and no distress Heart: regular rate and rhythm, S1, S2 normal, no murmur, click, rub or gallop Lungs: clear to auscultation bilaterally Abdomen: soft, non-tender; bowel sounds normal; no masses,  no organomegaly Extremities: extremities normal, atraumatic, no cyanosis or edema Wound: clean and dry without evidence of infection  Lab Results:  Recent Labs  10/26/16 1513 10/27/16 0147  WBC 5.9 7.5  HGB 9.8* 9.7*  HCT 31.2* 31.4*  PLT 454* 456*   BMET:  Recent Labs  10/26/16 0945 10/27/16 0147  NA 137 137  K 4.0 3.6  CL 107 105  CO2 22 21*  GLUCOSE 121* 93  BUN 14 10  CREATININE 1.03 1.04  CALCIUM 8.5* 8.5*    PT/INR: No results for input(s): LABPROT, INR in the last 72 hours. ABG    Component Value Date/Time   PHART 7.483 (H) 10/26/2016 1332   HCO3 21.9 10/26/2016 1332   TCO2 23 10/26/2016 1332   ACIDBASEDEF 1.0 10/26/2016 1332   O2SAT 96.0 10/26/2016 1332   CBG (last 3)   Recent Labs  10/26/16 0951  GLUCAP 105*    Assessment/Plan: S/P CABG x 3 on 10/07/2016. He was discharged to a SNF and now presents with acute encephalopathy.   1. Acute encephalopathy-Work up per attending.  Blood cultures NTD. Continue antibiotics. UA negative 2. Pneumonia-CXR reviewed. Continue antibiotics and follow blood cultures 3. Continue home medications s/p CABG. ASA, BB, statin 4. Cont. Lovenox for DVT proph 5. Weight remains the same from the date of discharge. Does not need diuretics.   Plan per primary. Will continue to follow along with you. Incisions clean and dry without drainage or erythema. Chest tube stitches removed.     LOS: 0 days    Sharlene Doryessa N Conte 10/27/2016

## 2016-10-27 NOTE — Evaluation (Signed)
Physical Therapy Evaluation Patient Details Name: Jason [name] MRN: [id number] DOB: Mar 27, 1930 Today's Date: 10/27/2016   History of Present Illness  83 y.o. male with medical history significant for CAD status post CABG April 13 of this year, GERD, hyperlipidemia, dysarthria, and anemia. He presented to the ED from [facility] (where he has resided since d/c from hospital 4/26) with chief complaint of altered mental status. Initial evaluation includes chest x-ray concerning for pneumonia.    Clinical Impression  Pt admitted with above diagnosis. Pt currently with functional limitations due to the deficits listed below (see PT Problem List). On eval, pt required min guard assist for all functional mobility including ambulation 300 feet with RW. Pt able to maintain SpO2 >90% on RA. He needed verbal cues to adhere to sternal precautions. Pt will benefit from skilled PT to increase their independence and safety with mobility to allow discharge to the venue listed below.  Recommend return to [facility] at d/c to resume rehab.     Follow Up Recommendations SNF;Supervision/Assistance - 24 hour    Equipment Recommendations  None recommended by PT    Recommendations for Other Services       Precautions / Restrictions Precautions Precautions: Fall;Sternal Precaution Comments: Reviewed sternal precautions. Restrictions Other Position/Activity Restrictions: sternal precautions      Mobility  Bed Mobility Overal bed mobility: Needs Assistance   Rolling: Modified independent (Device/Increase time)       Sit to sidelying: Min guard General bed mobility comments: verbal cues for sequencing and safety  Transfers Overall transfer level: Needs assistance Equipment used: Rolling walker (2 wheeled)   Sit to Stand: Min guard         General transfer comment: verbal cues for sternal precautions  Ambulation/Gait Ambulation/Gait assistance: Min guard Ambulation Distance (Feet): 300  Feet Assistive device: Rolling walker (2 wheeled) Gait Pattern/deviations: Step-through pattern;Decreased stride length Gait velocity: WFL Gait velocity interpretation: at or above normal speed for age/gender General Gait Details: Pt with c/o mild SOB requiring one standing rest break. Able to maintain sats > 90% on RA.  Stairs            Wheelchair Mobility    Modified Rankin (Stroke Patients Only)       Balance   Sitting-balance support: Feet supported;No upper extremity supported Sitting balance-Leahy Scale: Good     Standing balance support: Bilateral upper extremity supported;During functional activity Standing balance-Leahy Scale: Fair                               Pertinent Vitals/Pain Pain Assessment: No/denies pain    Home Living Family/patient expects to be discharged to:: Private residence Living Arrangements: Spouse/significant other Available Help at Discharge: Other (Comment) (wife unable to provide assist) Type of Home: House Home Access: Level entry     Home Layout: Two level;Able to live on main level with bedroom/bathroom Home Equipment: Dan Humphreys - 2 wheels;Cane - single point;Shower seat - built in Additional Comments: Prior to previous hospitalization (09/2016) pt liveed at home with wife who is there 24/7, however, she is unable to provide assist.     Prior Function Level of Independence: Independent         Comments: pt was driving and was primary caretaker of wife prior to 09/2016.     Hand Dominance   Dominant Hand: Right    Extremity/Trunk Assessment  Communication   Communication: No difficulties  Cognition Arousal/Alertness: Awake/alert Behavior During Therapy: Flat affect Overall Cognitive Status: Impaired/Different from baseline Area of Impairment: Memory;Following commands;Safety/judgement;Problem solving                     Memory: Decreased recall of precautions;Decreased  short-term memory Following Commands: Follows one step commands consistently;Follows multi-step commands inconsistently Safety/Judgement: Decreased awareness of safety;Decreased awareness of deficits   Problem Solving: Slow processing;Difficulty sequencing;Requires verbal cues;Requires tactile cues        General Comments      Exercises     Assessment/Plan    PT Assessment Patient needs continued PT services  PT Problem List Decreased strength;Decreased activity tolerance;Decreased balance;Decreased cognition;Decreased mobility;Decreased safety awareness;Decreased knowledge of precautions;Cardiopulmonary status limiting activity       PT Treatment Interventions DME instruction;Gait training;Stair training;Functional mobility training;Therapeutic activities;Therapeutic exercise;Balance training;Neuromuscular re-education;Cognitive remediation;Patient/family education    PT Goals (Current goals can be found in the Care Plan section)  Acute Rehab PT Goals Patient Stated Goal: not stated PT Goal Formulation: With patient Time For Goal Achievement: 11/10/16 Potential to Achieve Goals: Fair    Frequency Min 3X/week   Barriers to discharge        Co-evaluation               AM-PAC PT "6 Clicks" Daily Activity  Outcome Measure Difficulty turning over in bed (including adjusting bedclothes, sheets and blankets)?: A Little Difficulty moving from lying on back to sitting on the side of the bed? : A Little Difficulty sitting down on and standing up from a chair with arms (e.g., wheelchair, bedside commode, etc,.)?: A Little Help needed moving to and from a bed to chair (including a wheelchair)?: A Little Help needed walking in hospital room?: A Little Help needed climbing 3-5 steps with a railing? : A Little 6 Click Score: 18    End of Session Equipment Utilized During Treatment: Gait belt Activity Tolerance: Patient tolerated treatment well Patient left: in bed;with bed  alarm set;with call bell/phone within reach Nurse Communication: Mobility status PT Visit Diagnosis: Muscle weakness (generalized) (M62.81);Unsteadiness on feet (R26.81)    Time: [id number] PT Time Calculation (min) (ACUTE ONLY): 15 min   Charges:   PT Evaluation $PT Eval Moderate Complexity: 1 Procedure     PT G Codes:   PT G-Codes **NOT FOR INPATIENT CLASS** Functional Assessment Tool Used: AM-PAC 6 Clicks Basic Mobility Functional Limitation: Mobility: Walking and moving around Mobility: Walking and Moving Around Current Status ([id number](G8978): At least 40 percent but less than 60 percent impaired, limited or restricted Mobility: Walking and Moving Around Goal Status [phone](G8979): At least 20 percent but less than 40 percent impaired, limited or restricted    Aida RaiderWendy Sears Oran, PT  Office # [phone] Pager [phone]   Ilda FoilGarrow, Jolisa Intriago Rene 10/27/2016, 10:36 AM

## 2016-10-28 DIAGNOSIS — E43 Unspecified severe protein-calorie malnutrition: Secondary | ICD-10-CM

## 2016-10-28 DIAGNOSIS — R5381 Other malaise: Secondary | ICD-10-CM

## 2016-10-28 DIAGNOSIS — J189 Pneumonia, unspecified organism: Principal | ICD-10-CM

## 2016-10-28 DIAGNOSIS — G9340 Encephalopathy, unspecified: Secondary | ICD-10-CM

## 2016-10-28 LAB — CBC
HEMATOCRIT: 30.9 % — AB (ref 39.0–52.0)
Hemoglobin: 9.6 g/dL — ABNORMAL LOW (ref 13.0–17.0)
MCH: 28.1 pg (ref 26.0–34.0)
MCHC: 31.1 g/dL (ref 30.0–36.0)
MCV: 90.4 fL (ref 78.0–100.0)
Platelets: 431 10*3/uL — ABNORMAL HIGH (ref 150–400)
RBC: 3.42 MIL/uL — ABNORMAL LOW (ref 4.22–5.81)
RDW: 15.4 % (ref 11.5–15.5)
WBC: 6.1 10*3/uL (ref 4.0–10.5)

## 2016-10-28 LAB — COMPREHENSIVE METABOLIC PANEL
ALT: 21 U/L (ref 17–63)
ANION GAP: 8 (ref 5–15)
AST: 20 U/L (ref 15–41)
Albumin: 2.5 g/dL — ABNORMAL LOW (ref 3.5–5.0)
Alkaline Phosphatase: 109 U/L (ref 38–126)
BILIRUBIN TOTAL: 1.3 mg/dL — AB (ref 0.3–1.2)
BUN: 13 mg/dL (ref 6–20)
CO2: 23 mmol/L (ref 22–32)
Calcium: 8.5 mg/dL — ABNORMAL LOW (ref 8.9–10.3)
Chloride: 107 mmol/L (ref 101–111)
Creatinine, Ser: 1.05 mg/dL (ref 0.61–1.24)
Glucose, Bld: 108 mg/dL — ABNORMAL HIGH (ref 65–99)
POTASSIUM: 3.8 mmol/L (ref 3.5–5.1)
Sodium: 138 mmol/L (ref 135–145)
TOTAL PROTEIN: 6.2 g/dL — AB (ref 6.5–8.1)

## 2016-10-28 MED ORDER — POTASSIUM CHLORIDE CRYS ER 20 MEQ PO TBCR
20.0000 meq | EXTENDED_RELEASE_TABLET | Freq: Once | ORAL | Status: AC
  Administered 2016-10-28: 20 meq via ORAL
  Filled 2016-10-28: qty 1

## 2016-10-28 MED ORDER — LACTULOSE 10 GM/15ML PO SOLN
20.0000 g | Freq: Once | ORAL | Status: DC
  Filled 2016-10-28 (×2): qty 30

## 2016-10-28 MED ORDER — SENNOSIDES-DOCUSATE SODIUM 8.6-50 MG PO TABS
2.0000 | ORAL_TABLET | Freq: Two times a day (BID) | ORAL | Status: DC
  Administered 2016-10-28 – 2016-10-29 (×3): 2 via ORAL
  Filled 2016-10-28 (×3): qty 2

## 2016-10-28 MED ORDER — POLYETHYLENE GLYCOL 3350 17 G PO PACK
17.0000 g | PACK | Freq: Two times a day (BID) | ORAL | Status: DC
  Administered 2016-10-28 – 2016-10-29 (×2): 17 g via ORAL
  Filled 2016-10-28 (×2): qty 1

## 2016-10-28 NOTE — NC FL2 (Signed)
[place] MEDICAID FL2 LEVEL OF CARE SCREENING TOOL     IDENTIFICATION  Patient Name: Jason [name] Birthdate: 07/09/1929 Sex: male Admission Date (Current Location): 10/26/2016  [facility] and [place] Number:  Producer, television/film/video and Address:  [facility] [place], [address], [place], [place] [address]      Provider Number: [id number]  Attending Physician Name and Address:  Kathlen Mody, MD  Relative Name and Phone Number:  Joseph Bias    Current Level of Care: Hospital Recommended Level of Care: Skilled Nursing Facility Prior Approval Number:    Date Approved/Denied: 10/28/16 PASRR Number: [id number]  Discharge Plan: SNF    Current Diagnoses: Patient Active Problem List   Diagnosis Date Noted  . Other hyperlipidemia   . Acute encephalopathy 10/26/2016  . HCAP (healthcare-associated pneumonia) 10/26/2016  . 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 ACS (acute coronary syndrome) (HCC) 10/01/2016  . ST elevation myocardial infarction (STEMI) (HCC)   . Dysarthria 06/02/2014  . Gastrostomy in place [facility]) 10/11/2013  . Osteomyelitis (HCC) 08/07/2013  . Acute sinusitis 07/25/2013  . Osteomyelitis of skull (HCC) 07/25/2013  . Hyperglycemia 07/25/2013  . Normocytic anemia 07/25/2013  . Dysphagia 07/25/2013  . Protein-calorie malnutrition, severe (HCC) 07/02/2013  . Loss of weight 03/24/2013  . GERD (gastroesophageal reflux disease) 03/24/2013    Orientation RESPIRATION BLADDER Height & Weight     Self, Time, Situation, Place  Normal Continent Weight: 141 lb 11.2 oz (64.3 kg) Height:  5\' 4"  (162.6 cm)  BEHAVIORAL SYMPTOMS/MOOD NEUROLOGICAL BOWEL NUTRITION STATUS      Continent Diet (Heart Healthy)  AMBULATORY STATUS COMMUNICATION OF NEEDS Skin   Limited Assist Verbally Normal                       Personal Care Assistance Level of Assistance  Bathing, Feeding, Dressing Bathing Assistance: Limited assistance Feeding assistance:  Independent Dressing Assistance: Limited assistance     Functional Limitations Info  Sight, Hearing, Speech Sight Info: Adequate Hearing Info: Adequate Speech Info: Adequate    SPECIAL CARE FACTORS FREQUENCY  PT (By licensed PT), OT (By licensed OT)     PT Frequency: 5x wk OT Frequency: 5x wk            Contractures      Additional Factors Info  Code Status Code Status Info: Partial Code Allergies Info: BEE POLLEN, TETANUS TOXOIDS, HYDROCODONE            Current Medications (10/28/2016):  This is the current hospital active medication list Current Facility-Administered Medications  Medication Dose Route Frequency Provider Last Rate Last Dose  . acetaminophen (TYLENOL) tablet 650 mg  650 mg Oral Q6H PRN Gwenyth Bender, NP   650 mg at 10/27/16 2122   Or  . acetaminophen (TYLENOL) suppository 650 mg  650 mg Rectal Q6H PRN Gwenyth Bender, NP      . aspirin EC tablet 325 mg  325 mg Oral Daily Gwenyth Bender, NP   325 mg at 10/28/16 3086  . atorvastatin (LIPITOR) tablet 10 mg  10 mg Oral q1800 Gwenyth Bender, NP   10 mg at 10/27/16 1800  . bisacodyl (DULCOLAX) EC tablet 10 mg  10 mg Oral Daily Gwenyth Bender, NP   10 mg at 10/28/16 0935  . bisacodyl (DULCOLAX) suppository 10 mg  10 mg Rectal Daily Lesle Chris Black, NP      . ceFEPIme (MAXIPIME) 1 g in dextrose 5 %  50 mL IVPB  1 g Intravenous Q24H Gardner CandleJessica C Carney, RPH   Stopped at 10/27/16 2038  . enoxaparin (LOVENOX) injection 40 mg  40 mg Subcutaneous Q24H Gwenyth BenderKaren M Black, NP   40 mg at 10/28/16 0936  . feeding supplement (ENSURE ENLIVE) (ENSURE ENLIVE) liquid 237 mL  237 mL Oral TID BM Ozella Rocksavid J Merrell, MD   237 mL at 10/28/16 0945  . HYDROcodone-acetaminophen (NORCO/VICODIN) 5-325 MG per tablet 1-2 tablet  1-2 tablet Oral Q6H PRN Ozella Rocksavid J Merrell, MD   1 tablet at 10/26/16 2212  . lactulose (CHRONULAC) 10 GM/15ML solution 20 g  20 g Oral Once Donielle M Zimmerman, PA-C      . metoprolol tartrate (LOPRESSOR) tablet 12.5 mg  12.5 mg Oral  BID Gwenyth BenderKaren M Black, NP   12.5 mg at 10/28/16 0935  . ondansetron (ZOFRAN) tablet 4 mg  4 mg Oral Q6H PRN Gwenyth BenderKaren M Black, NP       Or  . ondansetron [facility](ZOFRAN) injection 4 mg  4 mg Intravenous Q6H PRN Gwenyth BenderKaren M Black, NP      . pantoprazole (PROTONIX) EC tablet 40 mg  40 mg Oral Daily Gwenyth BenderKaren M Black, NP   40 mg at 10/28/16 0935  . polyethylene glycol (MIRALAX / GLYCOLAX) packet 17 g  17 g Oral BID Kathlen ModyVijaya Akula, MD   17 g at 10/28/16 0945  . polyvinyl alcohol (LIQUIFILM TEARS) 1.4 % ophthalmic solution 2 drop  2 drop Both Eyes PRN Lesle ChrisKaren M Black, NP      . potassium chloride SA (K-DUR,KLOR-CON) CR tablet 20 mEq  20 mEq Oral Once Donielle M Zimmerman, PA-C      . senna-docusate (Senokot-S) tablet 2 tablet  2 tablet Oral BID Kathlen ModyVijaya Akula, MD   2 tablet at 10/28/16 0944  . sodium chloride flush (NS) 0.9 % injection 3 mL  3 mL Intravenous Q12H Gwenyth BenderKaren M Black, NP   3 mL at 10/28/16 0939  . vancomycin (VANCOCIN) IVPB 1000 mg/200 mL premix  1,000 mg Intravenous Q24H Baldemar Fridaylison M Masters, [facility] CenterRPH   Stopped at 10/27/16 1449     Discharge Medications: Please see discharge summary for a list of discharge medications.  Relevant Imaging Results:  Relevant Lab Results:   Additional Information SS#[id number]  Althea CharonAshley C Velencia Lenart, LCSW

## 2016-10-28 NOTE — Clinical Social Work Note (Signed)
Clinical Social Work Assessment  Patient Details  Name: Jason [name] MRN: [id number] Date of Birth: [birth year]  Date of referral:  10/28/16               Reason for consult:  Discharge Planning                Permission sought to share information with:  Family Supports Permission granted to share information::     Name::     Jason [name]  Agency::     Relationship::  wife  Contact Information:  [phone]  Housing/Transportation Living arrangements for the past 2 months:  Single Family Home Source of Information:  Patient Patient Interpreter Needed:  None Criminal Activity/Legal Involvement Pertinent to Current Situation/Hospitalization:  No - Comment as needed Significant Relationships:  Adult Children, Spouse Lives with:  Spouse Do you feel safe going back to the place where you live?  Yes Need for family participation in patient care:  Yes (Comment)  Care giving concerns:  Patient lives at home with wife but wife stated she is unable to take care of patient    Social Worker assessment / plan:  Clinical Social Worker met patient at bedside to offer support and discuss SNF placement. Patient has stayed at [facility] for 2 weeks before being readmitted into [facility].Patient stated he wants to go home but does not want to be a burden on his wife. Patient stated he wants wife to make the decision on whether he goes back to SNF  Employment status:  Retired Forensic scientist:  Medicare PT Recommendations:  [place] / Referral to community resources:  [place]  Patient/Family's Response to care:  Patient verbalized appreciation and understanding for CSW role and involvement in care. Patient agreeable with current discharge plan to SNF  Patient/Family's Understanding of and Emotional Response to Diagnosis, Current Treatment, and Prognosis:  Patient with good understanding of current medical state and limitations around most  recent hospitalization.   Emotional Assessment Appearance:  Appears stated age Attitude/Demeanor/Rapport:  Other Affect (typically observed):  Pleasant Orientation:  Oriented to Self, Oriented to Situation, Oriented to Place, Fluctuating Orientation (Suspected and/or reported Sundowners) Alcohol / Substance use:  Not Applicable Psych involvement (Current and /or in the community):  No (Comment)  Discharge Needs  Concerns to be addressed:  No discharge needs identified Readmission within the last 30 days:  Yes Current discharge risk:  None Barriers to Discharge:  No Barriers Identified   Wende Neighbors, LCSW 10/28/2016, 10:21 AM

## 2016-10-28 NOTE — Progress Notes (Signed)
Rehab admissions - I met with patient and his wife.  Patient was able to ambulate to bathroom with assistance.  Wife would like inpatient rehab admission.  Patient was at [facility] and wife was not happy with the care there.  If patient is still in the hospital on Monday, I will attempt authorization with [facility] at that time.  Call me for questions.  #[phone]

## 2016-10-28 NOTE — Progress Notes (Signed)
[facility] PROGRESS NOTE  Jason [name] ZOX:[id number] DOB: 18-Oct-1929 DOA: 10/26/2016   PCP: Jason MeoARONSON,RICHARD A, MD     Assessment/Plan: Principal Problem:   Acute encephalopathy Active Problems:   GERD (gastroesophageal reflux disease)   Protein-calorie malnutrition, severe (HCC)   HCAP (healthcare-associated pneumonia)   Other hyperlipidemia   83 y.o. male with medical history significant for CAD status post CABG April 13 of this year, GERD, hyperlipidemia, dysarthria, anemia, protein calorie malnutrition persists emergency Department chief complaint altered mental status. Initial evaluation includes chest x-ray concerning for pneumonia.  Assessment and plan #1. Acute encephalopathy. Likely multifactorial specifically infection (HCAP) versus pharmacy versus dehydration vs hypoxia. CT of the head without acute abnormalities. Chest x-ray left lower lobe consolidation consistent with pneumonia. Urinalysis unremarkable, CBG 105 lactic acid within the limits of normal,Oxygen saturation level greater than 90% on room air Can d/c telemetry.  Negative  blood cultures so far -Antibiotics, would resume them for another 24 hours and if no fever , would discharge him.  -Strep pneumo urine antigen -Hold sedating medications Ammonia within normal limits, vitamin B-12 within normal limits, RPR nonreactive    #2. Healthcare associated pneumonia. Chest x-ray as noted above. Patient's afebrile nontoxic appearing. Hemodynamically stable -Antibiotics per protocol Negative blood cultures so far.  -Sputum culture as able -Strep pneumo urine antigen negative.  Oxygenation stable, 96% on room air at rest He is s/p CABG and his at high risk for decompensation if not treated as an inpatient . Will need another 24 hours of IV antibiotics.     #3.HX recent STEMI s/p cabg. denies chest pain. EKG sinus rhythm as noted above. Patient denies chest pain. Home medications include aspirin  Lipitor -Continue aspirin , Lopressor and Lipitor    #4. Hypertension. Fair control in the emergency department -Continue beta blocker  #5. Dysarthria.  -appears stable at baseline -speech eval recommending, dysphagia 3 diet withthin liquids.    #6 Cellulitis Mild erythema to graft site. Improving, status posttreatment with  keflex 500 mg tid until 10/23/16.   #7 anemia-anemia of chronic disease, hemoglobin stable  DVT prophylaxsis Lovenox  Code Status:  Partial code     Family Communication: Discussed in detail with the patient, all imaging results, lab results explained to the patient   Disposition Plan:  PT OT consultation, home probably 1 day.       Consultants:  None  Procedures:  None  Antibiotics: Anti-infectives    Start     Dose/Rate Route Frequency Ordered Stop   10/27/16 1651  ceFEPIme (MAXIPIME) 1 g in dextrose 5 % 50 mL IVPB     1 g 100 mL/hr over 30 Minutes Intravenous Every 24 hours 10/26/16 2129     10/27/16 1300  vancomycin (VANCOCIN) IVPB 1000 mg/200 mL premix  Status:  Discontinued     1,000 mg 200 mL/hr over 60 Minutes Intravenous Every 24 hours 10/26/16 1151 10/26/16 1322   10/27/16 1300  vancomycin (VANCOCIN) IVPB 1000 mg/200 mL premix     1,000 mg 200 mL/hr over 60 Minutes Intravenous Every 24 hours 10/26/16 1332     10/26/16 1400  ceFEPIme (MAXIPIME) 1 g in dextrose 5 % 50 mL IVPB  Status:  Discontinued     1 g 100 mL/hr over 30 Minutes Intravenous Every 8 hours 10/26/16 1322 10/26/16 2129   10/26/16 1300  vancomycin (VANCOCIN) 1,250 mg in sodium chloride 0.9 % 250 mL IVPB     1,250 mg 166.7 mL/hr  over 90 Minutes Intravenous  Once 10/26/16 1151 10/26/16 1416   10/26/16 1200  piperacillin-tazobactam (ZOSYN) IVPB 3.375 g     3.375 g 100 mL/hr over 30 Minutes Intravenous  Once 10/26/16 1145 10/26/16 1249         HPI/Subjective: Patient says that his cough is better, breathing better, but not back to baseline.    Objective: Vitals:   10/27/16 1954 10/28/16 0510 10/28/16 0512 10/28/16 0935  BP: (!) 119/55 (!) 125/59  107/63  Pulse: 80 72  67  Resp: 18 18    Temp: 98.6 F (37 C) 98.2 F (36.8 C)    TempSrc: Oral Oral    SpO2: 95% 95%    Weight:   64.3 kg (141 lb 11.2 oz)   Height:        Intake/Output Summary (Last 24 hours) at 10/28/16 1316 Last data filed at 10/28/16 0800  Gross per 24 hour  Intake              600 ml  Output              450 ml  Net              150 ml    Exam:  Examination:  General exam: Appears calm and comfortable  Respiratory system: Clear to auscultation. Respiratory effort normal. Cardiovascular system: S1 & S2 heard, RRR. No JVD, murmurs, rubs, gallops or clicks. No pedal edema. Gastrointestinal system: Abdomen is nondistended, soft and nontender. No organomegaly or masses felt. Normal bowel sounds heard. Central nervous system: Alert and oriented. No focal neurological deficits. Extremities: Symmetric 5 x 5 power. Skin: No rashes, lesions or ulcers Psychiatry: Judgement and insight appear normal. Mood & affect appropriate.     Data Reviewed: I have personally reviewed following labs and imaging studies  Micro Results Recent Results (from the past 240 hour(s))  Culture, blood (Routine X 2) w Reflex to ID Panel     Status: None (Preliminary result)   Collection Time: 10/26/16 11:45 AM  Result Value Ref Range Status   Specimen Description BLOOD RIGHT ANTECUBITAL  Final   Special Requests   Final    BOTTLES DRAWN AEROBIC AND ANAEROBIC Blood Culture adequate volume   Culture NO GROWTH 1 DAY  Final   Report Status PENDING  Incomplete  Culture, blood (Routine X 2) w Reflex to ID Panel     Status: None (Preliminary result)   Collection Time: 10/26/16 11:50 AM  Result Value Ref Range Status   Specimen Description BLOOD RIGHT HAND  Final   Special Requests   Final    BOTTLES DRAWN AEROBIC ONLY Blood Culture adequate volume   Culture NO GROWTH 1 DAY   Final   Report Status PENDING  Incomplete    Radiology Reports Ct Head Wo Contrast  Result Date: 10/26/2016 CLINICAL DATA:  Change in mental status.CABG 4/13 states patient had been very "fidgety for several days and unresponsive today. Patient would open his eye with stimulation EXAM: CT HEAD WITHOUT CONTRAST TECHNIQUE: Contiguous axial images were obtained from the base of the skull through the vertex without intravenous contrast. COMPARISON:  Brain MRI, 08/08/2013.  Head CT, 06/24/2013. FINDINGS: Brain: No evidence of acute infarction, hemorrhage, hydrocephalus, extra-axial collection or mass lesion/mass effect. Vascular: No hyperdense vessel or unexpected calcification. Skull: Normal. Negative for fracture or focal lesion. Sinuses/Orbits: Visualize globes orbits are unremarkable. Visualized sinuses are relatively clear. Clear mastoid air cells. Other: None. IMPRESSION: 1. No acute intracranial abnormalities.  Electronically Signed   By: Amie Portland M.D.   On: 10/26/2016 12:52   Dg Chest Port 1 View  Result Date: 10/26/2016 CLINICAL DATA:  Confusion EXAM: PORTABLE CHEST 1 VIEW COMPARISON:  October 12, 2016 FINDINGS: There is consolidation in the left lower lobe. Lungs elsewhere are clear. Heart is upper normal in size with pulmonary vascularity within normal limits. Patient is status post coronary artery bypass grafting. No adenopathy. There is aortic atherosclerosis. No evident bone lesions. IMPRESSION: Left lower lobe consolidation consistent with pneumonia. Lungs elsewhere clear. Stable cardiac silhouette. There is aortic atherosclerosis. Electronically Signed   By: Bretta Bang III M.D.   On: 10/26/2016 09:45   Dg Chest Port 1 View  Result Date: 10/12/2016 CLINICAL DATA:  Status post cardiac surgery EXAM: PORTABLE CHEST 1 VIEW COMPARISON:  10/10/2016 FINDINGS: Cardiac shadow is stable. Postsurgical changes are again noted. Persistent left basilar changes are noted with new small pleural  effusion. Fullness in the right peritracheal region is noted related to patient rotation to the right. No other focal abnormality is seen. IMPRESSION: Slight increase in left basilar atelectasis/ infiltrate and small left effusion. Electronically Signed   By: Alcide Clever M.D.   On: 10/12/2016 07:58   Dg Chest Port 1 View  Result Date: 10/10/2016 CLINICAL DATA:  Status post coronary artery bypass graft. EXAM: PORTABLE CHEST 1 VIEW COMPARISON:  Radiograph of October 09, 2016. FINDINGS: Stable cardiomediastinal silhouette. Sternotomy wires are noted. No pneumothorax is noted. Stable position of right internal jugular venous sheath. Right lung is clear. Mild left basilar subsegmental atelectasis is noted. Bony thorax is unremarkable. IMPRESSION: Mild left basilar subsegmental atelectasis. Status post coronary artery bypass graft. Electronically Signed   By: Lupita Raider, M.D.   On: 10/10/2016 07:17   Dg Chest Port 1 View  Result Date: 10/09/2016 CLINICAL DATA:  CABG, postop day 2. EXAM: PORTABLE CHEST 1 VIEW COMPARISON:  10/08/2016 and prior exams FINDINGS: Cardiomegaly, CABG changes and right IJ central venous catheter sheath again noted. There has been interval removal of [name] Swan-Ganz catheter and mediastinal and left thoracostomy tubes. Bibasilar atelectasis again noted. There is no evidence of pulmonary edema or pneumothorax. IMPRESSION: Support apparatus removal otherwise unchanged appearance of the chest. No evidence of pneumothorax. Electronically Signed   By: Harmon Pier M.D.   On: 10/09/2016 07:31   Dg Chest Port 1 View  Result Date: 10/08/2016 CLINICAL DATA:  Postop day 1 CABG. EXAM: PORTABLE CHEST 1 VIEW COMPARISON:  One-view chest x-ray 10/07/2016 FINDINGS: The patient has been extubated. Left-sided chest tube and mediastinal drain remain. The tip of the Swan-Ganz catheter is in the proximal right pulmonary artery. There is no pneumothorax. [name] left pleural effusion remains. Left basilar airspace  disease is decreased, likely reflecting atelectasis. Small right pleural effusion is noted. IMPRESSION: 1. Interval extubation. 2. Decreasing left pleural effusion and associated atelectasis. 3. Small right pleural effusion. Electronically Signed   By: Marin Roberts M.D.   On: 10/08/2016 07:37   Dg Chest Port 1 View  Result Date: 10/07/2016 CLINICAL DATA:  83 year old male with [name] history of previous CABG EXAM: PORTABLE CHEST 1 VIEW COMPARISON:  10/01/2016, 08/13/2014 FINDINGS: Interval surgical changes of median sternotomy and CABG. Interval placement of endotracheal tube, which terminates suitably above the carina, approximately 2.7 cm. Interval placement of right IJ sheath through which [name] Swan-Ganz catheter terminates in the right pulmonary artery. Interval placement of gastric tube, terminating below the diaphragm out of the field of view. Interval  placement of mediastinal drains and left thoracostomy tube. Low lung volumes. Opacity at the left lung base partially obscuring the left hemidiaphragm and left heart border. No pneumothorax. IMPRESSION: Early postop changes of median sternotomy and CABG, with opacity at the left lung base, likely [name] combination of atelectasis and small pleural fluid. No visualized pneumothorax. Surgical apparatus includes endotracheal tube, gastric tube, right IJ sheath/Swan-Ganz, mediastinal/ pleural drains, as above. Electronically Signed   By: Gilmer Mor D.O.   On: 10/07/2016 14:02   Dg Chest Portable 1 View  Result Date: 10/01/2016 CLINICAL DATA:  Myocardial infarction EXAM: PORTABLE CHEST 1 VIEW COMPARISON:  August 13, 2014 FINDINGS: The heart size and mediastinal contours are within normal limits. Both lungs are clear. The visualized skeletal structures are unremarkable. IMPRESSION: No active disease. Electronically Signed   By: Gerome Sam III M.D   On: 10/01/2016 17:21   Dg Abd Portable 1v  Result Date: 10/10/2016 CLINICAL DATA:  CABG.  Nausea.  No  bowel movement. EXAM: PORTABLE ABDOMEN - 1 VIEW COMPARISON:  07/05/2013.  CT 05/06/2013. FINDINGS: Soft tissue structures are unremarkable. No bowel distention. Stool noted throughout the colon. No free air. Degenerative changes lumbar spine and both hips. IMPRESSION: No acute abnormality identified. No bowel distention. Stool noted throughout the colon. Electronically Signed   By: Maisie Fus  Register   On: 10/10/2016 10:18     CBC  Recent Labs Lab 10/26/16 1513 10/27/16 0147 10/28/16 0201  WBC 5.9 7.5 6.1  HGB 9.8* 9.7* 9.6*  HCT 31.2*  29.1* 31.4* 30.9*  PLT 454* 456* 431*  MCV 91.2 91.0 90.4  MCH 28.7 28.1 28.1  MCHC 31.4 30.9 31.1  RDW 15.6* 15.6* 15.4    Chemistries   Recent Labs Lab 10/26/16 0945 10/26/16 1513 10/27/16 0147 10/28/16 0201  NA 137  --  137 138  K 4.0  --  3.6 3.8  CL 107  --  105 107  CO2 22  --  21* 23  GLUCOSE 121*  --  93 108*  BUN 14  --  10 13  CREATININE 1.03  --  1.04 1.05  CALCIUM 8.5*  --  8.5* 8.5*  MG  --  2.1  --   --   AST 25  --   --  20  ALT 28  --   --  21  ALKPHOS 107  --   --  109  BILITOT 1.3*  --   --  1.3*   ------------------------------------------------------------------------------------------------------------------ estimated creatinine clearance is 43.9 mL/min (by C-G formula based on SCr of 1.05 mg/dL). ------------------------------------------------------------------------------------------------------------------ No results for input(s): HGBA1C in the last 72 hours. ------------------------------------------------------------------------------------------------------------------ No results for input(s): CHOL, HDL, LDLCALC, TRIG, CHOLHDL, LDLDIRECT in the last 72 hours. ------------------------------------------------------------------------------------------------------------------ No results for input(s): TSH, T4TOTAL, T3FREE, THYROIDAB in the last 72 hours.  Invalid input(s):  FREET3 ------------------------------------------------------------------------------------------------------------------  Recent Labs  10/26/16 1513  VITAMINB12 769    Coagulation profile No results for input(s): INR, PROTIME in the last 168 hours.  No results for input(s): DDIMER in the last 72 hours.  Cardiac Enzymes  Recent Labs Lab 10/26/16 1513  TROPONINI 0.07*   ------------------------------------------------------------------------------------------------------------------ Invalid input(s): POCBNP   CBG:  Recent Labs Lab 10/26/16 0951  GLUCAP 105*       Studies: No results found.    Lab Results  Component Value Date   HGBA1C 5.3 10/01/2016   Lab Results  Component Value Date   LDLCALC 103 (H) 10/02/2016   CREATININE 1.05 10/28/2016  Scheduled Meds: . aspirin  325 mg Oral Daily  . atorvastatin  10 mg Oral q1800  . bisacodyl  10 mg Oral Daily  . bisacodyl  10 mg Rectal Daily  . enoxaparin (LOVENOX) injection  40 mg Subcutaneous Q24H  . feeding supplement (ENSURE ENLIVE)  237 mL Oral TID BM  . lactulose  20 g Oral Once  . metoprolol tartrate  12.5 mg Oral BID  . pantoprazole  40 mg Oral Daily  . polyethylene glycol  17 g Oral BID  . potassium chloride  20 mEq Oral Once  . senna-docusate  2 tablet Oral BID  . sodium chloride flush  3 mL Intravenous Q12H   Continuous Infusions: . ceFEPime (MAXIPIME) IV Stopped (10/27/16 2038)  . vancomycin Stopped (10/27/16 1449)     LOS: 1 day    Time spent: >30 MINS    [facility]  [facility] Pager [phone] If 7PM-7AM, please contact night-coverage at [email or web address], password [facility] 10/28/2016, 1:16 PM  LOS: 1 day

## 2016-10-28 NOTE — Progress Notes (Signed)
      [address]       [place] [id number]             [phone]            Subjective: Patient states he has not had a bowel movement in 2-3 days. He was able to cough up some "mucous" earlier.  Objective: Vital signs in last 24 hours: Temp:  [98 F (36.7 C)-98.6 F (37 C)] 98.2 F (36.8 C) (05/04 0510) Pulse Rate:  [72-80] 72 (05/04 0510) Cardiac Rhythm: Normal sinus rhythm (05/04 0700) Resp:  [17-18] 18 (05/04 0510) BP: (116-125)/(50-59) 125/59 (05/04 0510) SpO2:  [95 %-98 %] 95 % (05/04 0510) Weight:  [64.3 kg (141 lb 11.2 oz)] 64.3 kg (141 lb 11.2 oz) (05/04 0512)  Current Weight  10/28/16 64.3 kg (141 lb 11.2 oz)      Intake/Output from previous day: 05/03 0701 - 05/04 0700 In: 800 [P.O.:600; IV Piggyback:200] Out: 650 [Urine:650]   Physical Exam:  Cardiovascular: RRR Pulmonary: Clear to auscultation bilaterally; no rales, wheezes, or rhonchi. Abdomen: Soft, non tender, bowel sounds present. Extremities: No lower extremity edema. Wounds: Clean and dry.  No erythema or signs of infection.  Lab Results: CBC: Recent Labs  10/27/16 0147 10/28/16 0201  WBC 7.5 6.1  HGB 9.7* 9.6*  HCT 31.4* 30.9*  PLT 456* 431*   BMET:  Recent Labs  10/27/16 0147 10/28/16 0201  NA 137 138  K 3.6 3.8  CL 105 107  CO2 21* 23  GLUCOSE 93 108*  BUN 10 13  CREATININE 1.04 1.05  CALCIUM 8.5* 8.5*    PT/INR:  Lab Results  Component Value Date   INR 1.55 10/07/2016   INR 0.99 10/01/2016   ABG:  INR: Will add last result for INR, ABG once components are confirmed Will add last 4 CBG results once components are confirmed  Assessment/Plan:  1. CV - s/p STEMI and CABG x 3 on 10/07/2016. On Lopressor 12.5 mg bid. 2.  Pulmonary - HCAP. On Cefepime and Vancomycin. Strep pneumo antigen negative and blood cultures negative to date. 3. Severe protein calorie malnutrition (HCC)-continue Ensure 4. Acute encephalopathy-CT and UA head negative.    5. Anemia-H and H stable at 9.6 and 30.9 6. Supplement potassium 7. LOC constipation 8. Management per medicine  Kadija Cruzen MPA-C 10/28/2016,8:08 AM

## 2016-10-28 NOTE — Consult Note (Signed)
Physical Medicine and Rehabilitation Consult Reason for Consult: Altered mental status Referring Physician: triad   HPI: Jason [name] is a 83 y.o.right handed  male with history of  hyperlipidemia. Patient with recent admission 09/30/2016 until 10/20/2016 after myocardial infarction and CABG 10/08/2016. Patient was discharged to [facility] skilled nursing facility ambulating 350 feet with rolling walker/sternal precautions noted bouts of confusion and restlessness.Prior to CABG independent and active living with his wife Presented 10/26/2016 with increased confusion and verbally unresponsive. By report patient has a sitter during the night who indicated he had been using his walker up to the bathroom that night. Cranial CT scan negative. Chest x-ray left lower lobe consolidation consistent with pneumonia. Ammonia level was 19, WBC 5900.Marland Kitchen Placed on broad-spectrum antibiotics. Blood cultures have remained negative. Subcutaneous Lovenox for DVT prophylaxis. Patient with regular consistency diet. Physical therapy evaluation completed. M.D. has requested physical medicine rehabilitation consult.   Review of Systems  Constitutional: Negative for chills.  HENT: Negative for hearing loss.   Eyes: Negative for blurred vision and double vision.  Respiratory: Positive for cough.   Cardiovascular: Positive for chest pain and palpitations. Negative for leg swelling.  Gastrointestinal: Positive for constipation.       GERD  Genitourinary: Negative for dysuria, flank pain and hematuria.  Skin: Negative for rash.  Neurological: Positive for weakness. Negative for seizures.  Psychiatric/Behavioral: Positive for memory loss. The patient has insomnia.        Anxiety  All other systems reviewed and are negative.  Past Medical History:  Diagnosis Date  . Anxiety    takes Diazepam daily prn  . Arthritis   . Constipation   . Coronary artery disease   . Depression    takes Trazodone nightly  .  Dysarthria   . Enlarged prostate   . GERD (gastroesophageal reflux disease)   . H/O hiatal hernia    takes Omeprazole daily  . Hemorrhoids   . History of colitis    many yrs ago  . History of colon polyps   . History of kidney stones    passed on his own  . Hyperlipidemia    was on medication but has been off for a while  . Insomnia    takes Trazodone nightly  . 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 Pneumonia 10/26/2016  . Protein calorie malnutrition (HCC) 10/2016  . STEMI (ST elevation myocardial infarction) (HCC)   . Urinary frequency    Past Surgical History:  Procedure Laterality Date  . bilateral cataract surgery    . CARDIAC SURGERY    . CIRCUMCISION    . COLONOSCOPY    . CORONARY ARTERY BYPASS GRAFT N/A 10/07/2016   Procedure: CORONARY ARTERY BYPASS GRAFTING (CABG) x 3 , using internal mammary, and right greater saphenous vein harvested endoscopically SVG-PD, SVG-OM, LIMA-LAD;  Surgeon: [name], MD;  Location: [facility] OR;  Service: Open Heart Surgery;  Laterality: N/A;  . DIRECT LARYNGOSCOPY N/A 06/26/2013   Procedure: DIRECT LARYNGOSCOPY;  Surgeon: Christia [facility], MD;  Location: [facility] OR;  Service: ENT;  Laterality: N/A;  . EAR CYST EXCISION N/A 04/05/2013   Procedure: CYST REMOVAL;  Surgeon: Melvenia Beam, MD;  Location: [facility] OR;  Service: ENT;  Laterality: N/A;  . ESOPHAGOGASTRODUODENOSCOPY    . ESOPHAGOGASTRODUODENOSCOPY N/A 07/15/2013   Procedure: ESOPHAGOGASTRODUODENOSCOPY (EGD);  Surgeon: Cherylynn Ridges, MD;  Location: [facility] ENDOSCOPY;  Service: General;  Laterality: N/A;  . HEMORRHOIDECTOMY WITH HEMORRHOID BANDING    .  HERNIA REPAIR     double  . LEFT HEART CATH AND CORONARY ANGIOGRAPHY N/A 10/01/2016   Procedure: Left Heart Cath and Coronary Angiography;  Surgeon: Lyn Records, MD;  Location: [facility] INVASIVE CV LAB;  Service: Cardiovascular;  Laterality: N/A;  . PEG PLACEMENT N/A 07/15/2013   Procedure: PERCUTANEOUS ENDOSCOPIC GASTROSTOMY (PEG) PLACEMENT;  Surgeon: Cherylynn Ridges, MD;  Location: [facility] ENDOSCOPY;  Service: General;  Laterality: N/A;  . PERIPHERALLY INSERTED CENTRAL CATHETER INSERTION    . right knee arthroscopy    . SEPTOPLASTY N/A 04/05/2013   Procedure: SEPTOPLASTY;  Surgeon: Melvenia Beam, MD;  Location: [facility] OR;  Service: ENT;  Laterality: N/A;  . SINUS ENDO W/FUSION N/A 07/03/2013   Procedure: ENDOSCOPIC SINUS SURGERY WITH FUSION NAVIGATION with By-Nasopharongeal Biopsy;  Surgeon: Christia [place], MD;  Location: [facility] OR;  Service: ENT;  Laterality: N/A;  . SINUS EXPLORATION Bilateral 07/11/2013   Procedure:  BEDSIDE  SINUS EXPLORATION ;  Surgeon: Christia Reading, MD;  Location: [facility] OR;  Service: ENT;  Laterality: Bilateral;  . TEE WITHOUT CARDIOVERSION N/A 10/07/2016   Procedure: TRANSESOPHAGEAL ECHOCARDIOGRAM (TEE);  Surgeon: [name], MD;  Location: [facility] OR;  Service: Open Heart Surgery;  Laterality: N/A;  . TONSILLECTOMY     Family History  Problem Relation Age of Onset  . CAD Father    Social History:  reports that he quit smoking about 23 years ago. He quit smokeless tobacco use about 3 years ago. His smokeless tobacco use included Chew. He reports that he does not drink alcohol or use drugs. Allergies:  Allergies  Allergen Reactions  . Bee Pollen Anaphylaxis  . Tetanus Toxoids Swelling and Other (See Comments)    SWELLING REACTION UNSPECIFIED ? LOCAL REACTION ?  . Hydrocodone Other (See Comments)    Confusion, "talking out of his head"   Medications Prior to Admission  Medication Sig Dispense Refill  . acetaminophen (TYLENOL) 325 MG tablet Take 650 mg by mouth every 6 (six) hours as needed for moderate pain.    Marland Kitchen aspirin EC 325 MG EC tablet Take 1 tablet (325 mg total) by mouth daily. 30 tablet 0  . atorvastatin (LIPITOR) 10 MG tablet Take 1 tablet (10 mg total) by mouth daily at 6 PM. 30 tablet 1  . bisacodyl (DULCOLAX) 10 MG suppository Place 1 suppository (10 mg total) rectally daily. 12 suppository 0  . bisacodyl (DULCOLAX) 5 MG  EC tablet Take 2 tablets (10 mg total) by mouth daily. 30 tablet 0  . cyclobenzaprine (FLEXERIL) 5 MG tablet Take 1 tablet (5 mg total) by mouth at bedtime. 10 tablet 0  . Melatonin 3 MG CAPS Take 3 mg by mouth at bedtime.    . metoprolol tartrate (LOPRESSOR) 25 MG tablet Take 0.5 tablets (12.5 mg total) by mouth 2 (two) times daily. 30 tablet 1  . pantoprazole (PROTONIX) 40 MG tablet Take 1 tablet (40 mg total) by mouth daily. 30 tablet 1  . polyethylene glycol (MIRALAX / GLYCOLAX) packet Take 17 g by mouth daily. (Patient taking differently: Take 17 g by mouth 2 (two) times daily. ) 14 each 0  . polyvinyl alcohol (LIQUIFILM TEARS) 1.4 % ophthalmic solution Place 2 drops into both eyes as needed for dry eyes. 15 mL 0  . sertraline (ZOLOFT) 25 MG tablet Take 25 mg by mouth daily.    Marland Kitchen UNABLE TO FIND Med Name: Med pass 120 mL by mouth 3 times daily between meals    . traMADol Janean Sark)  50 MG tablet Take 1 tablet (50 mg total) by mouth every 6 (six) hours as needed for moderate pain. (Patient not taking: Reported on 10/27/2016) 30 tablet 0    Home: Home Living Family/patient expects to be discharged to:: Private residence Living Arrangements: Spouse/significant other Available Help at Discharge: Other (Comment) (wife unable to provide physical assist) Type of Home: House Home Access: Level entry Home Layout: Two level, Able to live on main level with bedroom/bathroom Alternate Level Stairs-Number of Steps: one step out to garage Bathroom Shower/Tub: Health visitorWalk-in shower Bathroom Toilet: Handicapped height Home Equipment: Environmental consultantWalker - 2 wheels, [place] - single point, Shower seat - built in Additional Comments: Prior to previous hospitalization (09/2016) pt liveed at home with wife who is there 24/7, however, she is unable to provide assist.   Functional History: Prior Function Level of Independence: Independent Comments: pt was driving and was primary caretaker of wife prior to 09/2016. Functional Status:    Mobility: Bed Mobility Overal bed mobility: Needs Assistance Bed Mobility: Rolling, Sidelying to Sit Rolling: Supervision Sidelying to sit: Supervision Sit to sidelying: Min guard General bed mobility comments: Cues for sequencing and technique Transfers Overall transfer level: Needs assistance Equipment used: Rolling walker (2 wheeled) Transfers: Sit to/from Stand Sit to Stand: Min guard General transfer comment: Cues for hand placement and maintaining sternal precautions Ambulation/Gait Ambulation/Gait assistance: Min guard Ambulation Distance (Feet): 300 Feet Assistive device: Rolling walker (2 wheeled) Gait Pattern/deviations: Step-through pattern, Decreased stride length General Gait Details: Pt with c/o mild SOB requiring one standing rest break. Able to maintain sats > 90% on RA. Gait velocity: WFL Gait velocity interpretation: at or above normal speed for age/gender    ADL: ADL Overall ADL's : Needs assistance/impaired Eating/Feeding: Set up, Sitting Grooming: Min guard, Standing Upper Body Bathing: Minimal assistance, Sitting Lower Body Bathing: Minimal assistance, Sit to/from stand Upper Body Dressing : Minimal assistance, Sitting Lower Body Dressing: Minimal assistance, Sit to/from stand Lower Body Dressing Details (indicate cue type and reason): Able to cross foot over opposite knee to adjust socks Toilet Transfer: Min guard, Ambulation, RW Toileting- Clothing Manipulation and Hygiene: Min guard, Sit to/from stand Functional mobility during ADLs: Min guard, Rolling walker General ADL Comments: Pt requires cues throghout functional activities to maintain sternal precautions  Cognition: Cognition Overall Cognitive Status: Within Functional Limits for tasks assessed Orientation Level: Oriented to person, Oriented to place, Oriented to situation Cognition Arousal/Alertness: Awake/alert Behavior During Therapy: WFL for tasks assessed/performed Overall Cognitive  Status: Within Functional Limits for tasks assessed Area of Impairment: Memory, Following commands, Safety/judgement, Problem solving Memory: Decreased recall of precautions, Decreased short-term memory Following Commands: Follows one step commands consistently, Follows multi-step commands inconsistently Safety/Judgement: Decreased awareness of safety, Decreased awareness of deficits Problem Solving: Slow processing, Difficulty sequencing, Requires verbal cues, Requires tactile cues  Blood pressure 107/63, pulse 67, temperature 98.2 F (36.8 C), temperature source Oral, resp. rate 18, height 5\' 4"  (1.626 m), weight 64.3 kg (141 lb 11.2 oz), SpO2 95 %. Physical Exam  Vitals reviewed. HENT:  Head: Normocephalic.  Eyes: EOM are normal. Left eye exhibits no discharge.  Neck: Normal range of motion. Neck supple. No thyromegaly present.  Cardiovascular: Normal rate and regular rhythm.   Respiratory:  Fair inspiratory effort with decreased breath sounds at the bases  GI: Soft. Bowel sounds are normal. He exhibits no distension.  Neurological: He is alert.  Mood is flat.Provides name age and place .Follows commands. Speech dysarthric. Fair insight and awareness. Motor 4/5 bilateral  UE's and 4-/5 prox to 4/5 distal LE's.   Skin:  Midline chest incision well-healed. Graft sites right lower ext noted  Psychiatric: He has a normal mood and affect. His behavior is normal.    Results for orders placed or performed during the hospital encounter of 10/26/16 (from the past 24 hour(s))  CBC     Status: Abnormal   Collection Time: 10/28/16  2:01 AM  Result Value Ref Range   WBC 6.1 4.0 - 10.5 K/uL   RBC 3.42 (L) 4.22 - 5.81 MIL/uL   Hemoglobin 9.6 (L) 13.0 - 17.0 g/dL   HCT 16.1 (L) 09.6 - 04.5 %   MCV 90.4 78.0 - 100.0 fL   MCH 28.1 26.0 - 34.0 pg   MCHC 31.1 30.0 - 36.0 g/dL   RDW 40.9 81.1 - 91.4 %   Platelets 431 (H) 150 - 400 K/uL  Comprehensive metabolic panel     Status: Abnormal    Collection Time: 10/28/16  2:01 AM  Result Value Ref Range   Sodium 138 135 - 145 mmol/L   Potassium 3.8 3.5 - 5.1 mmol/L   Chloride 107 101 - 111 mmol/L   CO2 23 22 - 32 mmol/L   Glucose, Bld 108 (H) 65 - 99 mg/dL   BUN 13 6 - 20 mg/dL   Creatinine, Ser 7.82 0.61 - 1.24 mg/dL   Calcium 8.5 (L) 8.9 - 10.3 mg/dL   Total Protein 6.2 (L) 6.5 - 8.1 g/dL   Albumin 2.5 (L) 3.5 - 5.0 g/dL   AST 20 15 - 41 U/L   ALT 21 17 - 63 U/L   Alkaline Phosphatase 109 38 - 126 U/L   Total Bilirubin 1.3 (H) 0.3 - 1.2 mg/dL   GFR calc non Af Amer >60 >60 mL/min   GFR calc Af Amer >60 >60 mL/min   Anion gap 8 5 - 15   No results found.  Assessment/Plan: Diagnosis: debility, mild confusion and dysphagia after recent CABG and readmission for pneumonia 1. Does the need for close, 24 hr/day medical supervision in concert with the patient's rehab needs make it unreasonable for this patient to be served in a less intensive setting? Yes 2. Co-Morbidities requiring supervision/potential complications: GERD, malnutrition 3. Due to bladder management, bowel management, safety, skin/wound care, disease management, medication administration, pain management and patient education, does the patient require 24 hr/day rehab nursing? Yes 4. Does the patient require coordinated care of a physician, rehab nurse, PT (1-2 hrs/day, 5 days/week), OT (1-2 hrs/day, 5 days/week) and SLP (1-2 hrs/day, 5 days/week) to address physical and functional deficits in the context of the above medical diagnosis(es)? Yes Addressing deficits in the following areas: balance, endurance, locomotion, strength, transferring, bowel/bladder control, bathing, dressing, feeding, grooming, toileting, cognition and swallowing 5. Can the patient actively participate in an intensive therapy program of at least 3 hrs of therapy per day at least 5 days per week? Yes 6. The potential for patient to make measurable gains while on inpatient rehab is  excellent 7. Anticipated functional outcomes upon discharge from inpatient rehab are modified independent  with PT, modified independent with OT, modified independent with SLP. 8. Estimated rehab length of stay to reach the above functional goals is: 7 days 9. Does the patient have adequate social supports and living environment to accommodate these discharge functional goals? Yes 10. Anticipated D/C setting: Home 11. Anticipated post D/C treatments: HH therapy and Outpatient therapy 12. Overall Rehab/Functional Prognosis: excellent  RECOMMENDATIONS: This patient's condition  is appropriate for continued rehabilitative care in the following setting: CIR Patient has agreed to participate in recommended program. Yes Note that insurance prior authorization may be required for reimbursement for recommended care.  Comment: Rehab Admissions Coordinator to follow up.  Thanks,  Ranelle Oyster, MD, [place]    Charlton Amor., PA-C 10/28/2016 long

## 2016-10-28 NOTE — Progress Notes (Signed)
Clinical Social Worker met with patient and wife at bedside to go over discharge options. Wife stated that she would rather patient discharge to CIR and if patient is unable to go to CIR she would like patient to go home with Home Health to follow. Patient is in agreement with spouse decision. CSW is signing off as patient has no more social work needs.  Rhea Pink, MSW,  [place]

## 2016-10-28 NOTE — Progress Notes (Signed)
  Speech Language Pathology Treatment: Dysphagia  Patient Details Name: Jason [name] MRN: [id number] DOB: 08/30/1929 Today's Date: 10/28/2016 Time: 1000-1014 SLP Time Calculation (min) (ACUTE ONLY): 14 min  Assessment / Plan / Recommendation Clinical Impression  Pt is tolerating PO diet well with no indications of aspiration; doubt oropharyngeal swallow as contributing to lung status.  Pt with baseline oral deficits due to lingual paresis left side, contributing to dysarthria.  Manages POs well.  No further acute SLP needs identified - our services will sign off.   HPI HPI: 83 y.o. male with medical history significant for CAD status post CABG April 13 of this year, GERD, hyperlipidemia, dysarthria, and anemia. He presented to the ED from [facility] (where he has resided since d/c from hospital 4/26) with chief complaint of altered mental status. 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 At that time he had a MBS and diet of Dys 1, thin liquids was recommended.       SLP Plan  All goals met;Discharge SLP treatment due to (functioning at baseline)       Recommendations  Diet recommendations: Dysphagia 3 (mechanical soft);Thin liquid Liquids provided via: Cup;Straw Medication Administration: Whole meds with liquid Supervision: Patient able to self feed                Oral Care Recommendations: Oral care BID Follow up Recommendations: None SLP Visit Diagnosis: Dysphagia, oral phase (R13.11) Plan: All goals met;Discharge SLP treatment due to (comment)       GO                Juan Quam Laurice 10/28/2016, 10:16 AM

## 2016-10-28 NOTE — Clinical Social Work Placement (Signed)
   CLINICAL SOCIAL WORK PLACEMENT  NOTE  Date:  10/28/2016  Patient Details  Name: Jason [name] MRN: [id number] Date of Birth: Jan 26, 1930  Clinical Social Work is seeking post-discharge placement for this patient at the Skilled  Nursing Facility level of care (*CSW will initial, date and re-position this form in  chart as items are completed):  Yes   Patient/family provided with [facility] Clinical Social Work Department's list of facilities offering this level of care within the geographic area requested by the patient (or if unable, by the patient's family).  Yes   Patient/family informed of their freedom to choose among providers that offer the needed level of care, that participate in Medicare, Medicaid or managed care program needed by the patient, have an available bed and are willing to accept the patient.  Yes   Patient/family informed of [facility]'s ownership interest [facility] and [facility], as well as of the fact that they are under no obligation to receive care at these facilities.  PASRR submitted to EDS on       PASRR number received on       Existing PASRR number confirmed on       FL2 transmitted to all facilities in geographic area requested by pt/family on       FL2 transmitted to all facilities within larger geographic area on       Patient informed that his/her managed care company has contracts with or will negotiate with certain facilities, including the following:            Patient/family informed of bed offers received.  Patient chooses bed at       Physician recommends and patient chooses bed at      Patient to be transferred to   on  .  Patient to be transferred to facility by       Patient family notified on   of transfer.  Name of family member notified:        PHYSICIAN Please sign FL2     Additional Comment:    _______________________________________________ Althea CharonAshley C Aayan Haskew, LCSW 10/28/2016, 10:31 AM

## 2016-10-29 ENCOUNTER — Inpatient Hospital Stay (HOSPITAL_COMMUNITY): Payer: Medicare Other

## 2016-10-29 MED ORDER — ENSURE ENLIVE PO LIQD: 237.0000 mL | Freq: Three times a day (TID) | ORAL | 12 refills | Status: DC

## 2016-10-29 MED ORDER — LEVOFLOXACIN 750 MG PO TABS: 750.0000 mg | ORAL_TABLET | Freq: Every day | ORAL | 0 refills | Status: DC

## 2016-10-29 NOTE — Progress Notes (Addendum)
      [address]       [place] [id number]             [phone]         Subjective: Sleepy this morning.   Objective: Vital signs in last 24 hours: Temp:  [97.9 F (36.6 C)-98 F (36.7 C)] 98 F (36.7 C) (05/05 0440) Pulse Rate:  [65-92] 81 (05/05 0440) Cardiac Rhythm: Normal sinus rhythm (05/04 1900) Resp:  [16-18] 16 (05/05 0440) BP: (107-138)/(48-65) 138/61 (05/05 0440) SpO2:  [97 %] 97 % (05/05 0440)     Intake/Output from previous day: 05/04 0701 - 05/05 0700 In: [id number] [P.O.:12670] Out: -  Intake/Output this shift: Total I/O In: -  Out: 125 [Urine:125]  General appearance: cooperative and no distress Heart: regular rate and rhythm, S1, S2 normal, no murmur, click, rub or gallop Lungs: clear to auscultation bilaterally Abdomen: soft, non-tender; bowel sounds normal; no masses,  no organomegaly Extremities: extremities normal, atraumatic, no cyanosis or edema Wound: clean and dry without drainage  Lab Results:  Recent Labs  10/27/16 0147 10/28/16 0201  WBC 7.5 6.1  HGB 9.7* 9.6*  HCT 31.4* 30.9*  PLT 456* 431*   BMET:  Recent Labs  10/27/16 0147 10/28/16 0201  NA 137 138  K 3.6 3.8  CL 105 107  CO2 21* 23  GLUCOSE 93 108*  BUN 10 13  CREATININE 1.04 1.05  CALCIUM 8.5* 8.5*    PT/INR: No results for input(s): LABPROT, INR in the last 72 hours. ABG    Component Value Date/Time   PHART 7.483 (H) 10/26/2016 1332   HCO3 21.9 10/26/2016 1332   TCO2 23 10/26/2016 1332   ACIDBASEDEF 1.0 10/26/2016 1332   O2SAT 96.0 10/26/2016 1332   CBG (last 3)  No results for input(s): GLUCAP in the last 72 hours.  Assessment/Plan:  1. CV - s/p STEMI and CABG x 3 on 10/07/2016. On Lopressor 12.5 mg bid. 2.  Pulmonary - HCAP. On Cefepime and Vancomycin. Strep pneumo antigen negative and blood cultures negative to date. 3. Severe protein calorie malnutrition (HCC)-continue Ensure 4. Acute encephalopathy-CT of head and UA  negative.    5. Anemia-H and H stable at 9.6 and 30.9, yesterday 6. LOC constipation, three bowel movements yest, per family 7. Management per medicine, discharge planning, needs aggressive rehab, PT/OT. Likely dehydrated. Encouraged oral intake.    LOS: 2 days    Jason [name] 10/29/2016 Awake and alert without confusion walked to bathroom and back without assistance  Acute encephalopathy in SNF - started on zoloft in SNF  I have seen and examined Jason SchultzeBrice A [name] and agree with the above assessment  and plan.  Jason OvensEdward B Kaileb Monsanto MD [place] [phone] Office [phone] 10/29/2016 11:52 AM

## 2016-10-29 NOTE — Progress Notes (Signed)
Pt/family given discharge instructions, medication lists, follow up appointments, and when to call the doctor.  Pt/family verbalizes understanding. Hazael Olveda McClintock, RN   

## 2016-10-31 LAB — CULTURE, BLOOD (ROUTINE X 2)
Culture: NO GROWTH
Culture: NO GROWTH
Special Requests: ADEQUATE
Special Requests: ADEQUATE

## 2016-11-01 ENCOUNTER — Encounter: Payer: Self-pay | Admitting: Nurse Practitioner

## 2016-11-01 ENCOUNTER — Ambulatory Visit: Payer: Medicare Other | Admitting: Nurse Practitioner

## 2016-11-01 ENCOUNTER — Ambulatory Visit (INDEPENDENT_AMBULATORY_CARE_PROVIDER_SITE_OTHER): Payer: Medicare Other | Admitting: Nurse Practitioner

## 2016-11-01 VITALS — BP 120/62 | HR 73 | Ht 64.0 in | Wt 142.0 lb

## 2016-11-01 DIAGNOSIS — I2510 Atherosclerotic heart disease of native coronary artery without angina pectoris: Secondary | ICD-10-CM | POA: Diagnosis not present

## 2016-11-01 DIAGNOSIS — E785 Hyperlipidemia, unspecified: Secondary | ICD-10-CM

## 2016-11-01 DIAGNOSIS — I2109 ST elevation (STEMI) myocardial infarction involving other coronary artery of anterior wall: Secondary | ICD-10-CM | POA: Diagnosis not present

## 2016-11-01 MED ORDER — ATORVASTATIN CALCIUM 40 MG PO TABS: 40.0000 mg | ORAL_TABLET | Freq: Every day | ORAL | 3 refills | Status: DC

## 2016-11-01 NOTE — Patient Instructions (Signed)
Lab in 1 month ( lipid and hepatic panel )    Increase Lipitor 40 mg daily     Your physician recommends that you schedule a follow-up appointment with Dr.Crenshaw in 2 to 3 months.

## 2016-11-01 NOTE — Progress Notes (Signed)
Office Visit    Patient Name: Jason [name] Date of Encounter: 11/01/2016  Primary Care Provider:  Geoffry ParadiseAronson, Richard, MD Primary Cardiologist:  B. Jens Somrenshaw, MD   Chief Complaint    83 year old male status post recent anterior MI and subsequent CABG who presents for post hospital follow-up.  Past Medical History    Past Medical History:  Diagnosis Date  . Anxiety    takes Diazepam daily prn  . Arthritis   . Constipation   . Coronary artery disease    a. 09/2016 Ant STEMI/Cath: LM 60, LAD 2840m/d (thrombotic), LCX nl, OM2 90/75, RCA nl, EF 35-45%;  b. 09/2016 CABG x 3 (LIMA->LAD, VG->OM2, VG->LPDA).  . Depression    takes Trazodone nightly  . Dysarthria   . Enlarged prostate   . GERD (gastroesophageal reflux disease)   . H/O hiatal hernia    takes Omeprazole daily  . Hemorrhoids   . History of colitis    many yrs ago  . History of colon polyps   . History of kidney stones    passed on his own  . Hyperlipidemia    was on medication but has been off for a while  . Insomnia    takes Trazodone nightly  . Ischemic dilated cardiomyopathy (HCC)    a. 09/2016 LV Gram: EF 35-45%;  b. 09/2016 Echo: EF 45-50%, mild conc LVH, no rwma, Gr1 DD, mod AI, triv MR, mild TR, PASP 31mmHg.  . 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 Pneumonia 10/26/2016  . Protein calorie malnutrition (HCC) 10/2016  . Urinary frequency    Past Surgical History:  Procedure Laterality Date  . bilateral cataract surgery    . CARDIAC SURGERY    . CIRCUMCISION    . COLONOSCOPY    . CORONARY ARTERY BYPASS GRAFT N/A 10/07/2016   Procedure: CORONARY ARTERY BYPASS GRAFTING (CABG) x 3 , using internal mammary, and right greater saphenous vein harvested endoscopically SVG-PD, SVG-OM, LIMA-LAD;  Surgeon: Delight OvensEdward B Gerhardt, MD;  Location: [facility] OR;  Service: Open Heart Surgery;  Laterality: N/A;  . DIRECT LARYNGOSCOPY N/A 06/26/2013   Procedure: DIRECT LARYNGOSCOPY;  Surgeon: Christia Readingwight Bates, MD;  [facility];  Service:  ENT;  Laterality: N/A;  . EAR CYST EXCISION N/A 04/05/2013   Procedure: CYST REMOVAL;  Surgeon: Melvenia BeamMitchell Gore, MD;  [facility];  Service: ENT;  Laterality: N/A;  . ESOPHAGOGASTRODUODENOSCOPY    . ESOPHAGOGASTRODUODENOSCOPY N/A 07/15/2013   Procedure: ESOPHAGOGASTRODUODENOSCOPY (EGD);  Surgeon: Cherylynn RidgesJames O Wyatt, MD;  [facility];  Service: General;  Laterality: N/A;  . HEMORRHOIDECTOMY WITH HEMORRHOID BANDING    . HERNIA REPAIR     double  . LEFT HEART CATH AND CORONARY ANGIOGRAPHY N/A 10/01/2016   Procedure: Left Heart Cath and Coronary Angiography;  Surgeon: Lyn RecordsHenry W Smith, MD;  [facility];  Service: Cardiovascular;  Laterality: N/A;  . PEG PLACEMENT N/A 07/15/2013   Procedure: PERCUTANEOUS ENDOSCOPIC GASTROSTOMY (PEG) PLACEMENT;  Surgeon: Cherylynn RidgesJames O Wyatt, MD;  Location: [facility] ENDOSCOPY;  Service: General;  Laterality: N/A;  . PERIPHERALLY INSERTED CENTRAL CATHETER INSERTION    . right knee arthroscopy    . SEPTOPLASTY N/A 04/05/2013   Procedure: SEPTOPLASTY;  Surgeon: Melvenia BeamMitchell Gore, MD;  [name];  Service: ENT;  Laterality: N/A;  . SINUS ENDO W/FUSION N/A 07/03/2013   Procedure: ENDOSCOPIC SINUS SURGERY WITH FUSION NAVIGATION with By-Nasopharongeal Biopsy;  Surgeon: Christia Readingwight Bates, MD;  [facility];  Service: ENT;  Laterality: N/A;  .  SINUS EXPLORATION Bilateral 07/11/2013   Procedure:  BEDSIDE  SINUS EXPLORATION ;  Surgeon: Christia Reading, MD;  Location: [facility] OR;  Service: ENT;  Laterality: Bilateral;  . TEE WITHOUT CARDIOVERSION N/A 10/07/2016   Procedure: TRANSESOPHAGEAL ECHOCARDIOGRAM (TEE);  Surgeon: [name], MD;  Location[facility];  Service: Open Heart Surgery;  Laterality: N/A;  . TONSILLECTOMY      Allergies  Allergies  Allergen Reactions  . Bee Pollen Anaphylaxis  . Tetanus Toxoids Swelling and Other (See Comments)    SWELLING REACTION UNSPECIFIED ? LOCAL REACTION ?  . Hydrocodone Other (See Comments)    Confusion, "talking out of his head"     History of Present Illness    83 year old male with the above complex past medical history including hyperlipidemia, GERD, depression, and anxiety. In April 2018, he was admitted to [facility] with chest discomfort and found to have anterior ST segment elevation. Pain resolved upon arrival to the emergency department. He was subsequently taken to the catheterization laboratory where he was found to have moderate left main, severe LAD, and severe obtuse marginal disease. EF was initially measured at 35-45%. It was felt that he would be best served by bypass surgery and he was subsequently evaluated by the surgical team. He underwent CABG 3. Postoperatively, he was anemic and placed on iron and he also had an ileus. He eventually recovered and was discharged to rehabilitation. There, he developed pneumonia and encephalopathy. He was readmitted and following appropriate treatment with antibiotics and improvement in mental and respiratory status, he was discharged. Since discharge, he has done reasonably well. He has not required any when necessary pain medication for his chest wall. His incisions have been healing well. He has not had angina or dyspnea. He is with his wife and 2 other family members today. He has been tolerating his medications well and is looking forward to increasing his activity. He has follow-up with thoracic surgery later this month. He denies PND, orthopnea, palpitations, dizziness, syncope, edema, or early satiety.  Home Medications    Prior to Admission medications   Medication Sig Start Date End Date Taking? Authorizing Provider  acetaminophen (TYLENOL) 325 MG tablet Take 650 mg by mouth every 6 (six) hours as needed for moderate pain.   Yes [provider]  aspirin EC 325 MG EC tablet Take 1 tablet (325 mg total) by mouth daily. 10/20/16  Yes Conte, Tessa N, PA-C  feeding supplement, ENSURE ENLIVE, (ENSURE ENLIVE) LIQD Take 237 mLs by mouth 3 (three) times daily  between meals. 10/29/16  Yes Kathlen Mody, MD  levofloxacin (LEVAQUIN) 750 MG tablet Take 1 tablet (750 mg total) by mouth daily. 10/29/16  Yes Kathlen Mody, MD  metoprolol tartrate (LOPRESSOR) 25 MG tablet Take 0.5 tablets (12.5 mg total) by mouth 2 (two) times daily. 10/19/16  Yes Conte, Tessa N, PA-C  pantoprazole (PROTONIX) 40 MG tablet Take 1 tablet (40 mg total) by mouth daily. 10/20/16  Yes Asa Lente, Tessa N, PA-C  polyvinyl alcohol (LIQUIFILM TEARS) 1.4 % ophthalmic solution Place 2 drops into both eyes as needed for dry eyes. 10/19/16  Yes Conte, Tessa N, PA-C  atorvastatin (LIPITOR) 40 MG tablet Take 1 tablet (40 mg total) by mouth daily. 11/01/16 01/30/17  Ok Anis, NP    Review of Systems    As above, he has been doing well since his most recent discharge for pneumonia. He denies chest pain, palpitations, dyspnea, PND, orthopnea, dizziness, syncope, edema, or early satiety. Surgical sites  are healing well.  All other systems reviewed and are otherwise negative except as noted above.  Physical Exam    VS:  BP 120/62   Pulse 73   Ht 5\' 4"  (1.626 m)   Wt 142 lb (64.4 kg)   BMI 24.37 kg/m  , BMI Body mass index is 24.37 kg/m. GEN: Somewhat frail, in no acute distress.  HEENT: normal.  Neck: Supple, no JVD, carotid bruits, or masses. Cardiac: RRR, no murmurs, rubs, or gallops. No clubbing, cyanosis, edema.  Radials/DP/PT 2+ and equal bilaterally. Midsternal and right lower extremity incisions are healing well without drainage or erythema. Respiratory:  Respirations regular and unlabored, clear to auscultation bilaterally. GI: Soft, nontender, nondistended, BS + x 4. Abdominal drain sites are healing well without drainage or erythema. MS: no deformity or atrophy. Skin: warm and dry, no rash. Neuro:  Strength and sensation are intact. Psych: Normal affect.  Accessory Clinical Findings    ECG - Regular sinus rhythm, 73, inferior T-wave inversion with anterior infarct and deep  anterior T-wave inversion.  Assessment & Plan    1.  Anterior ST segment elevation myocardial infarction, subsequent episode of care/CAD status post coronary artery bypass grafting 3: Recent admission with chest pain and anterior ST elevation. Catheterization revealed severe left main, LAD, and obtuse marginal disease. He is now status post CABG 3. Post hospital course was, By development of healthcare acquired pneumonia and encephalopathy, requiring readmission. He was just discharged the other day. He has been doing well without incisional pain, angina, or dyspnea. Surgical sites have been healing well. He remains on aspirin, statin, and beta blocker therapy. I will increase his statin dose to 40 mg daily given recent ACS and LDL over 100. He is interested in cardiac rehabilitation once cleared by thoracic surgery.  2. Ischemic cardiomyopathy: EF was initially 35-45% by left ventriculography and was later noted to be 45-50% by echocardiogram. He is euvolemic on exam today. Blood pressure was softer and hospitalization, limiting medical therapy. He is tolerating low-dose beta blocker.  We discussed the importance of daily weights, sodium restriction, medication compliance, and symptom reporting and he verbalizes understanding.   3. Hyperlipidemia: LDL was 109 and April 7. He is currently on Lipitor 10 mg daily. In the setting of recent acute coronary syndrome, I will increase the potency of his dose to 40 mg daily. Plan to follow-up lipids and LFTs in 4-6 weeks.  4. Disposition: Patient will follow-up with thoracic surgery later this month. I will arrange for follow-up with Dr. Jens Som in 2-3 months. He is interested in cardiac rehabilitation.  Nicolasa Ducking, NP 11/01/2016, 1:04 PM

## 2016-11-02 NOTE — Discharge Summary (Addendum)
Physician Discharge Summary  Regino SchultzeBrice A Mceachin WUJ:[id number] DOB: July 02, 1929 DOA: 10/26/2016  PCP: Geoffry ParadiseAronson, Richard, MD  Admit date: 10/26/2016 Discharge date: 10/29/2016  Admitted From: Home.  Disposition:  Home.   Recommendations for Outpatient Follow-up:  1. Follow up with PCP in 1-2 weeks 2. Please obtain BMP/CBC in one week 3. Please follow up with cardiology as recommended.     Discharge Condition: stable  CODE STATUS: full code.  Diet recommendation: Heart Healthy.  Brief/Interim Summary: 83 y.o.malewith medical history significant for CAD status post CABG April 13 of this year, GERD, hyperlipidemia, dysarthria, anemia, protein calorie malnutrition persists emergency Department chief complaint altered mental status. Initial evaluation includes chest x-ray concerning for pneumonia.  Discharge Diagnoses:  Principal Problem:   Acute encephalopathy Active Problems:   GERD (gastroesophageal reflux disease)   Protein-calorie malnutrition, severe (HCC)   HCAP (healthcare-associated pneumonia)   Other hyperlipidemia  #1. Acute encephalopathy. Likely multifactorial specifically infection (HCAP) versus pharmacy versus dehydration vs hypoxia.CT of the head without acute abnormalities. Chest x-ray left lower lobe consolidation consistent with pneumonia.Urinalysis unremarkable, CBG 105 lactic acid within the limits of normal,Oxygen saturation level greater than 90% on room air Negative  blood cultures so far. -Strep pneumo urine antigen is negative.  Ammonia within normal limits, vitamin B-12 within normal limits, RPR nonreactive. Complete the course of antibiotics on discharge.     #2. Healthcare associated pneumonia. Chest x-ray as noted above. Patient's afebrile nontoxic appearing. Hemodynamically stable Negative blood cultures so far.  -Strep pneumo urine antigen negative.  Oxygenation stable, 96% on room air at rest He is s/p CABG and his at high risk for decompensation if not  treated as an inpatient . Complete the course of antibiotics.    #3.HX recent STEMI s/p cabg. denies chest pain. EKG sinus rhythm as noted above. Patient denies chest pain. Home medications include aspirin Lipitor -Continue aspirin , Lopressor and Lipitor    #4. Hypertension. Fair control in the emergency department -Continue beta blocker  #5. Dysarthria.  -appears stable at baseline -speech eval recommending, dysphagia 3 diet with thin liquids.    #6 Cellulitis Mild erythema to graft site. Improving, status posttreatment with  keflex 500 mg tid.  #7 anemia-anemia of chronic disease, hemoglobin stable   Discharge Instructions  Discharge Instructions    Diet - low sodium heart healthy    Complete by:  As directed    Increase activity slowly    Complete by:  As directed      Allergies as of 10/29/2016      Reactions   Bee Pollen Anaphylaxis   Tetanus Toxoids Swelling, Other (See Comments)   SWELLING REACTION UNSPECIFIED ? LOCAL REACTION ?   Hydrocodone Other (See Comments)   Confusion, "talking out of his head"      Medication List    STOP taking these medications   bisacodyl 5 MG EC tablet Commonly known as:  DULCOLAX   sertraline 25 MG tablet Commonly known as:  ZOLOFT   traMADol 50 MG tablet Commonly known as:  ULTRAM     TAKE these medications   acetaminophen 325 MG tablet Commonly known as:  TYLENOL Take 650 mg by mouth every 6 (six) hours as needed for moderate pain.   aspirin 325 MG EC tablet Take 1 tablet (325 mg total) by mouth daily.   feeding supplement (ENSURE ENLIVE) Liqd Take 237 mLs by mouth 3 (three) times daily between meals.   levofloxacin 750 MG tablet Commonly known as:  LEVAQUIN Take  1 tablet (750 mg total) by mouth daily.   metoprolol tartrate 25 MG tablet Commonly known as:  LOPRESSOR Take 0.5 tablets (12.5 mg total) by mouth 2 (two) times daily.   pantoprazole 40 MG tablet Commonly known as:  PROTONIX Take 1  tablet (40 mg total) by mouth daily.   polyvinyl alcohol 1.4 % ophthalmic solution Commonly known as:  LIQUIFILM TEARS Place 2 drops into both eyes as needed for dry eyes.      Follow-up Information    Geoffry Paradise, MD. Schedule an appointment as soon as possible for a visit in 1 week(s).   Specialty:  Internal Medicine Contact information: [address] [place] [address] [phone]          Allergies  Allergen Reactions  . Bee Pollen Anaphylaxis  . Tetanus Toxoids Swelling and Other (See Comments)    SWELLING REACTION UNSPECIFIED ? LOCAL REACTION ?  . Hydrocodone Other (See Comments)    Confusion, "talking out of his head"    Consultations:  Cardiothoracic surgery.    Procedures/Studies: Dg Chest 2 View  Result Date: 10/29/2016 CLINICAL DATA:  Chest pain, weakness. Status post coronary artery bypass graft. EXAM: CHEST  2 VIEW COMPARISON:  Radiograph of Oct 26, 2016. FINDINGS: Stable cardiomediastinal silhouette. Status post coronary artery bypass graft. Atherosclerosis of thoracic aorta is noted. No pneumothorax is noted. Minimal bilateral pleural effusions are noted. Minimal bibasilar subsegmental atelectasis. Bony thorax is unremarkable. IMPRESSION: Aortic atherosclerosis. Minimal bibasilar subsegmental atelectasis and pleural effusions. Electronically Signed   By: Lupita Raider, M.D.   On: 10/29/2016 13:57   Ct Head Wo Contrast  Result Date: 10/26/2016 CLINICAL DATA:  Change in mental status.CABG 4/13 states patient had been very "fidgety for several days and unresponsive today. Patient would open his eye with stimulation EXAM: CT HEAD WITHOUT CONTRAST TECHNIQUE: Contiguous axial images were obtained from the base of the skull through the vertex without intravenous contrast. COMPARISON:  Brain MRI, 08/08/2013.  Head CT, 06/24/2013. FINDINGS: Brain: No evidence of acute infarction, hemorrhage, hydrocephalus, extra-axial collection or mass lesion/mass effect.  Vascular: No hyperdense vessel or unexpected calcification. Skull: Normal. Negative for fracture or focal lesion. Sinuses/Orbits: Visualize globes orbits are unremarkable. Visualized sinuses are relatively clear. Clear mastoid air cells. Other: None. IMPRESSION: 1. No acute intracranial abnormalities. Electronically Signed   By: Amie Portland M.D.   On: 10/26/2016 12:52   Dg Chest Port 1 View  Result Date: 10/26/2016 CLINICAL DATA:  Confusion EXAM: PORTABLE CHEST 1 VIEW COMPARISON:  October 12, 2016 FINDINGS: There is consolidation in the left lower lobe. Lungs elsewhere are clear. Heart is upper normal in size with pulmonary vascularity within normal limits. Patient is status post coronary artery bypass grafting. No adenopathy. There is aortic atherosclerosis. No evident bone lesions. IMPRESSION: Left lower lobe consolidation consistent with pneumonia. Lungs elsewhere clear. Stable cardiac silhouette. There is aortic atherosclerosis. Electronically Signed   By: Bretta Bang III M.D.   On: 10/26/2016 09:45   Dg Chest Port 1 View  Result Date: 10/12/2016 CLINICAL DATA:  Status post cardiac surgery EXAM: PORTABLE CHEST 1 VIEW COMPARISON:  10/10/2016 FINDINGS: Cardiac shadow is stable. Postsurgical changes are again noted. Persistent left basilar changes are noted with new small pleural effusion. Fullness in the right peritracheal region is noted related to patient rotation to the right. No other focal abnormality is seen. IMPRESSION: Slight increase in left basilar atelectasis/ infiltrate and small left effusion. Electronically Signed   By: Eulah Pont.D.  On: 10/12/2016 07:58   Dg Chest Port 1 View  Result Date: 10/10/2016 CLINICAL DATA:  Status post coronary artery bypass graft. EXAM: PORTABLE CHEST 1 VIEW COMPARISON:  Radiograph of October 09, 2016. FINDINGS: Stable cardiomediastinal silhouette. Sternotomy wires are noted. No pneumothorax is noted. Stable position of right internal jugular venous  sheath. Right lung is clear. Mild left basilar subsegmental atelectasis is noted. Bony thorax is unremarkable. IMPRESSION: Mild left basilar subsegmental atelectasis. Status post coronary artery bypass graft. Electronically Signed   By: Lupita Raider, M.D.   On: 10/10/2016 07:17   Dg Chest Port 1 View  Result Date: 10/09/2016 CLINICAL DATA:  CABG, postop day 2. EXAM: PORTABLE CHEST 1 VIEW COMPARISON:  10/08/2016 and prior exams FINDINGS: Cardiomegaly, CABG changes and right IJ central venous catheter sheath again noted. There has been interval removal of a Swan-Ganz catheter and mediastinal and left thoracostomy tubes. Bibasilar atelectasis again noted. There is no evidence of pulmonary edema or pneumothorax. IMPRESSION: Support apparatus removal otherwise unchanged appearance of the chest. No evidence of pneumothorax. Electronically Signed   By: Harmon Pier M.D.   On: 10/09/2016 07:31   Dg Chest Port 1 View  Result Date: 10/08/2016 CLINICAL DATA:  Postop day 1 CABG. EXAM: PORTABLE CHEST 1 VIEW COMPARISON:  One-view chest x-ray 10/07/2016 FINDINGS: The patient has been extubated. Left-sided chest tube and mediastinal drain remain. The tip of the Swan-Ganz catheter is in the proximal right pulmonary artery. There is no pneumothorax. A left pleural effusion remains. Left basilar airspace disease is decreased, likely reflecting atelectasis. Small right pleural effusion is noted. IMPRESSION: 1. Interval extubation. 2. Decreasing left pleural effusion and associated atelectasis. 3. Small right pleural effusion. Electronically Signed   By: Marin Roberts M.D.   On: 10/08/2016 07:37   Dg Chest Port 1 View  Result Date: 10/07/2016 CLINICAL DATA:  83 year old male with a history of previous CABG EXAM: PORTABLE CHEST 1 VIEW COMPARISON:  10/01/2016, 08/13/2014 FINDINGS: Interval surgical changes of median sternotomy and CABG. Interval placement of endotracheal tube, which terminates suitably above the  carina, approximately 2.7 cm. Interval placement of right IJ sheath through which a Swan-Ganz catheter terminates in the right pulmonary artery. Interval placement of gastric tube, terminating below the diaphragm out of the field of view. Interval placement of mediastinal drains and left thoracostomy tube. Low lung volumes. Opacity at the left lung base partially obscuring the left hemidiaphragm and left heart border. No pneumothorax. IMPRESSION: Early postop changes of median sternotomy and CABG, with opacity at the left lung base, likely a combination of atelectasis and small pleural fluid. No visualized pneumothorax. Surgical apparatus includes endotracheal tube, gastric tube, right IJ sheath/Swan-Ganz, mediastinal/ pleural drains, as above. Electronically Signed   By: Gilmer Mor D.O.   On: 10/07/2016 14:02   Dg Abd Portable 1v  Result Date: 10/10/2016 CLINICAL DATA:  CABG.  Nausea.  No bowel movement. EXAM: PORTABLE ABDOMEN - 1 VIEW COMPARISON:  07/05/2013.  CT 05/06/2013. FINDINGS: Soft tissue structures are unremarkable. No bowel distention. Stool noted throughout the colon. No free air. Degenerative changes lumbar spine and both hips. IMPRESSION: No acute abnormality identified. No bowel distention. Stool noted throughout the colon. Electronically Signed   By: Maisie Fus  Register   On: 10/10/2016 10:18       Subjective: No new complaints.   Discharge Exam: Vitals:   10/29/16 0440 10/29/16 1414  BP: 138/61 (!) 126/54  Pulse: 81 74  Resp: 16 16  Temp: 98 F (36.7  C) 97.7 F (36.5 C)   Vitals:   10/28/16 2037 10/28/16 2119 10/29/16 0440 10/29/16 1414  BP: (!) 107/56 (!) 112/48 138/61 (!) 126/54  Pulse: 90 92 81 74  Resp: 18  16 16   Temp: 97.9 F (36.6 C)  98 F (36.7 C) 97.7 F (36.5 C)  TempSrc: Oral  Oral Oral  SpO2: 97%  97% 97%  Weight:      Height:        General: Pt is alert, awake, not in acute distress Cardiovascular: RRR, S1/S2 +, no rubs, no gallops Respiratory:  CTA bilaterally, no wheezing, no rhonchi Abdominal: Soft, NT, ND, bowel sounds + Extremities: no edema, no cyanosis    The results of significant diagnostics from this hospitalization (including imaging, microbiology, ancillary and laboratory) are listed below for reference.     Microbiology: Recent Results (from the past 240 hour(s))  Culture, blood (Routine X 2) w Reflex to ID Panel     Status: None   Collection Time: 10/26/16 11:45 AM  Result Value Ref Range Status   Specimen Description BLOOD RIGHT ANTECUBITAL  Final   Special Requests   Final    BOTTLES DRAWN AEROBIC AND ANAEROBIC Blood Culture adequate volume   Culture NO GROWTH 5 DAYS  Final   Report Status 10/31/2016 FINAL  Final  Culture, blood (Routine X 2) w Reflex to ID Panel     Status: None   Collection Time: 10/26/16 11:50 AM  Result Value Ref Range Status   Specimen Description BLOOD RIGHT HAND  Final   Special Requests   Final    BOTTLES DRAWN AEROBIC ONLY Blood Culture adequate volume   Culture NO GROWTH 5 DAYS  Final   Report Status 10/31/2016 FINAL  Final     Labs: BNP (last 3 results)  Recent Labs  10/01/16 1627 10/01/16 1950 10/03/16 0159  BNP 59.7 82.7 269.4*   Basic Metabolic Panel:  Recent Labs Lab 10/27/16 0147 10/28/16 0201  NA 137 138  K 3.6 3.8  CL 105 107  CO2 21* 23  GLUCOSE 93 108*  BUN 10 13  CREATININE 1.04 1.05  CALCIUM 8.5* 8.5*   Liver Function Tests:  Recent Labs Lab 10/28/16 0201  AST 20  ALT 21  ALKPHOS 109  BILITOT 1.3*  PROT 6.2*  ALBUMIN 2.5*   No results for input(s): LIPASE, AMYLASE in the last 168 hours. No results for input(s): AMMONIA in the last 168 hours. CBC:  Recent Labs Lab 10/27/16 0147 10/28/16 0201  WBC 7.5 6.1  HGB 9.7* 9.6*  HCT 31.4* 30.9*  MCV 91.0 90.4  PLT 456* 431*   Cardiac Enzymes: No results for input(s): CKTOTAL, CKMB, CKMBINDEX, TROPONINI in the last 168 hours. BNP: Invalid input(s): POCBNP CBG: No results for  input(s): GLUCAP in the last 168 hours. D-Dimer No results for input(s): DDIMER in the last 72 hours. Hgb A1c No results for input(s): HGBA1C in the last 72 hours. Lipid Profile No results for input(s): CHOL, HDL, LDLCALC, TRIG, CHOLHDL, LDLDIRECT in the last 72 hours. Thyroid function studies No results for input(s): TSH, T4TOTAL, T3FREE, THYROIDAB in the last 72 hours.  Invalid input(s): FREET3 Anemia work up No results for input(s): VITAMINB12, FOLATE, FERRITIN, TIBC, IRON, RETICCTPCT in the last 72 hours. Urinalysis    Component Value Date/Time   COLORURINE YELLOW 10/26/2016 1041   APPEARANCEUR CLEAR 10/26/2016 1041   LABSPEC 1.015 10/26/2016 1041   PHURINE 7.0 10/26/2016 1041   GLUCOSEU NEGATIVE 10/26/2016 1041  HGBUR NEGATIVE 10/26/2016 1041   BILIRUBINUR NEGATIVE 10/26/2016 1041   KETONESUR NEGATIVE 10/26/2016 1041   PROTEINUR NEGATIVE 10/26/2016 1041   UROBILINOGEN 1.0 07/06/2013 1156   NITRITE NEGATIVE 10/26/2016 1041   LEUKOCYTESUR NEGATIVE 10/26/2016 1041   Sepsis Labs Invalid input(s): PROCALCITONIN,  WBC,  LACTICIDVEN Microbiology Recent Results (from the past 240 hour(s))  Culture, blood (Routine X 2) w Reflex to ID Panel     Status: None   Collection Time: 10/26/16 11:45 AM  Result Value Ref Range Status   Specimen Description BLOOD RIGHT ANTECUBITAL  Final   Special Requests   Final    BOTTLES DRAWN AEROBIC AND ANAEROBIC Blood Culture adequate volume   Culture NO GROWTH 5 DAYS  Final   Report Status 10/31/2016 FINAL  Final  Culture, blood (Routine X 2) w Reflex to ID Panel     Status: None   Collection Time: 10/26/16 11:50 AM  Result Value Ref Range Status   Specimen Description BLOOD RIGHT HAND  Final   Special Requests   Final    BOTTLES DRAWN AEROBIC ONLY Blood Culture adequate volume   Culture NO GROWTH 5 DAYS  Final   Report Status 10/31/2016 FINAL  Final     Time coordinating discharge: Over 30 minutes  SIGNED:   Kathlen Mody, MD  [facility] 11/02/2016, 10:22 PM Pager   If 7PM-7AM, please contact night-coverage [email or web address] Password [email or web address]

## 2016-11-04 ENCOUNTER — Telehealth (HOSPITAL_COMMUNITY): Payer: Self-pay

## 2016-11-04 ENCOUNTER — Telehealth: Payer: Self-pay | Admitting: Cardiology

## 2016-11-04 DIAGNOSIS — Z951 Presence of aortocoronary bypass graft: Secondary | ICD-10-CM

## 2016-11-04 NOTE — Telephone Encounter (Signed)
New Message  Pts wife voiced calling about an in home rehab nurse or care.  Pts wife voiced she knows pt need physical therapy.  Please f/u with pt

## 2016-11-04 NOTE — Telephone Encounter (Signed)
Spoke with pt wife she states that she [facility] with [facility] and she states that pt is unable to do that rehab she states that he needs individualized in-home cardiac rehab. She states that cardiac rehab agreed with her and then after having the in-home cardiac then [facility] after he is a little stronger [facility]. Pt had OV with [place],NP 11-01-16, see note.Please advise if ok for pt to have in home cardiac rehab.

## 2016-11-04 NOTE — Telephone Encounter (Signed)
Spoke with pt wife, she would like to use [facility] for [facility]. I have talked with their office and they will be getting in touch with the patient by Monday or Tuesday. Wife made aware. Demographics and order faxed to [phone].

## 2016-11-04 NOTE — Telephone Encounter (Signed)
I called [facility] [place] office they said that they will need written orders and how many times a week and for how long

## 2016-11-04 NOTE — Telephone Encounter (Signed)
[facility]

## 2016-11-04 NOTE — Telephone Encounter (Signed)
I called and spoke to patient and his wife about cardiac rehab. [facility] Patient is at home with wife. Patient and his wife feels he is not ready for cardiac rehab at this time. They feel patient needs in home physical therapy services. Patient is not strong enough to participate. Patient wife is expecting someone to call about in home physical therapy. I informed patient wife I will put referral on hold and will reach out to them at a later time to see if they would like to proceed with referral. Patient wife thanked me for calling.

## 2016-11-07 ENCOUNTER — Encounter: Payer: Medicare Other | Admitting: Physician Assistant

## 2016-11-17 ENCOUNTER — Ambulatory Visit: Payer: Medicare Other | Admitting: Cardiothoracic Surgery

## 2016-11-23 ENCOUNTER — Other Ambulatory Visit: Payer: Self-pay | Admitting: Cardiothoracic Surgery

## 2016-11-23 DIAGNOSIS — Z951 Presence of aortocoronary bypass graft: Secondary | ICD-10-CM

## 2016-11-24 ENCOUNTER — Encounter: Payer: Self-pay | Admitting: Cardiothoracic Surgery

## 2016-11-24 ENCOUNTER — Ambulatory Visit (INDEPENDENT_AMBULATORY_CARE_PROVIDER_SITE_OTHER): Payer: Self-pay | Admitting: Cardiothoracic Surgery

## 2016-11-24 ENCOUNTER — Ambulatory Visit
Admission: RE | Admit: 2016-11-24 | Discharge: 2016-11-24 | Disposition: A | Discharge status: Home / Self Care | Payer: Medicare Other | Source: Ambulatory Visit | Attending: Cardiothoracic Surgery | Admitting: Cardiothoracic Surgery

## 2016-11-24 VITALS — BP 109/59 | HR 53 | Ht 64.0 in | Wt 138.0 lb

## 2016-11-24 DIAGNOSIS — Z951 Presence of aortocoronary bypass graft: Secondary | ICD-10-CM

## 2016-11-24 NOTE — Patient Instructions (Signed)
Go to cardiac rehab after pt at home ends

## 2016-11-24 NOTE — Progress Notes (Signed)
[address]       [place], [id number]             [phone]      Jason [name] #[id number] Date of Birth: [birth year]  Referring: Jason RecordsSmith, Henry W, MD Primary Care: Jason ParadiseAronson, Richard, MD  Chief Complaint:   POST OP FOLLOW UP 10/07/2016    PREOPERATIVE DIAGNOSIS:  Coronary occlusive disease with recent anterior myocardial infarction, ST elevation myocardial infarction. POSTOPERATIVE DIAGNOSIS:  Coronary occlusive disease with recent anterior myocardial infarction, ST elevation myocardial infarction. SURGICAL PROCEDURE:  Coronary artery bypass grafting x3 with left internal mammary to the left anterior descending coronary artery, reverse saphenous vein graft to the second obtuse marginal coronary artery, reverse saphenous vein graft to the posterior descending coronary artery rising off the distal circumflex with right thigh greater saphenous endo vein harvesting. SURGEON:  Jason PlaneEdward Lijah Bourque, MD.  History of Present Illness:     Patient slowly progressing after recent urgent coronary artery bypass grafting for ST elevation myocardial infarction and critical anatomy. The patient is now at home with home health and physical therapy. He is slowly improving, is able to ambulate with a walker. His main complaint is fatigue weakness and feeling muffled sounds in his left ear.       Past Medical History:  Diagnosis Date  . Anxiety    takes Diazepam daily prn  . Arthritis   . Constipation   . Coronary artery disease    a. 09/2016 Ant STEMI/Cath: LM 60, LAD 1364m/d (thrombotic), LCX nl, OM2 90/75, RCA nl, EF 35-45%;  b. 09/2016 CABG x 3 (LIMA->LAD, VG->OM2, VG->LPDA).  . Depression    takes Trazodone nightly  . Dysarthria   . Enlarged prostate   . GERD (gastroesophageal reflux disease)   . H/O hiatal hernia    takes Omeprazole daily  . Hemorrhoids   . History of colitis    many yrs ago  . History of colon polyps   . History of  kidney stones    passed on his own  . Hyperlipidemia    was on medication but has been off for a while  . Insomnia    takes Trazodone nightly  . Ischemic dilated cardiomyopathy (HCC)    a. 09/2016 LV Gram: EF 35-45%;  b. 09/2016 Echo: EF 45-50%, mild conc LVH, no rwma, Gr1 DD, mod AI, triv MR, mild TR, PASP 31mmHg.  . 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 Pneumonia 10/26/2016  . Protein calorie malnutrition (HCC) 10/2016  . Urinary frequency      History  Smoking Status  . Former Smoker  . Quit date: 07/01/1993  Smokeless Tobacco  . Former NeurosurgeonUser  . Types: Chew  . Quit date: 12/29/2012    Comment: quit chewing tobacco several months ago and stopped smoking cigars yrs ago    History  Alcohol Use No     Allergies  Allergen Reactions  . Bee Pollen Anaphylaxis  . Tetanus Toxoids Swelling and Other (See Comments)    SWELLING REACTION UNSPECIFIED ? LOCAL REACTION ?  . Hydrocodone Other (See Comments)    Confusion, "talking out of his head"    Current Outpatient Prescriptions  Medication Sig Dispense Refill  . acetaminophen (TYLENOL) 325 MG tablet Take 650 mg by mouth every 6 (six) hours as needed for moderate pain.    Marland Kitchen. aspirin EC 325 MG EC tablet Take 1 tablet (325 mg total) by mouth daily. 30 tablet  0  . atorvastatin (LIPITOR) 40 MG tablet Take 1 tablet (40 mg total) by mouth daily. 90 tablet 3  . feeding supplement, ENSURE ENLIVE, (ENSURE ENLIVE) LIQD Take 237 mLs by mouth 3 (three) times daily between meals. 237 mL 12  . metoprolol tartrate (LOPRESSOR) 25 MG tablet Take 0.5 tablets (12.5 mg total) by mouth 2 (two) times daily. 30 tablet 1  . pantoprazole (PROTONIX) 40 MG tablet Take 1 tablet (40 mg total) by mouth daily. 30 tablet 1  . polyvinyl alcohol (LIQUIFILM TEARS) 1.4 % ophthalmic solution Place 2 drops into both eyes as needed for dry eyes. 15 mL 0   No current facility-administered medications for this visit.        Physical Exam: BP (!) 109/59 (BP Location:  Right Arm, Patient Position: Sitting, Cuff Size: Normal)   Pulse (!) 53   Ht 5\' 4"  (1.626 m)   Wt 138 lb (62.6 kg)   SpO2 98% Comment: RA  BMI 23.69 kg/m   General appearance: alert and cooperative Neurologic: intact Heart: regular rate and rhythm, S1, S2 normal, no murmur, click, rub or gallop Lungs: clear to auscultation bilaterally Abdomen: soft, non-tender; bowel sounds normal; no masses,  no organomegaly Extremities: extremities normal, atraumatic, no cyanosis or edema and Homans sign is negative, no sign of DVT Wound: Sternum is stable and well healed vein harvest sites also healing well patient has no pedal edema looking in the left ear the patient does have a blue colored tube in the left eardrum, exam is limited but I do not think there is fluid behind the eardrum  Diagnostic Studies & Laboratory data:     Recent Radiology Findings:   Dg Chest 2 View  Result Date: 11/24/2016 CLINICAL DATA:  History of CABG. EXAM: CHEST  2 VIEW COMPARISON:  10/29/2016 FINDINGS: Previous median sternotomy and CABG procedure. The heart size and mediastinal contours are within normal limits. Both lungs are clear. The visualized skeletal structures are unremarkable. IMPRESSION: No active cardiopulmonary disease. Electronically Signed   By: Jason Kell M.D.   On: 11/24/2016 10:45      Recent Lab Findings: Lab Results  Component Value Date   WBC 6.1 10/28/2016   HGB 9.6 (L) 10/28/2016   HCT 30.9 (L) 10/28/2016   PLT 431 (H) 10/28/2016   GLUCOSE 108 (H) 10/28/2016   CHOL 173 10/02/2016   TRIG 139 10/02/2016   HDL 42 10/02/2016   LDLCALC 103 (H) 10/02/2016   ALT 21 10/28/2016   AST 20 10/28/2016   NA 138 10/28/2016   K 3.8 10/28/2016   CL 107 10/28/2016   CREATININE 1.05 10/28/2016   BUN 13 10/28/2016   CO2 23 10/28/2016   TSH 6.063 (H) 10/01/2016   INR 1.55 10/07/2016   HGBA1C 5.3 10/01/2016      Assessment / Plan:     Slow progression after urgent coronary artery bypass  grafting, continue home physical therapy and then progress to outpatient physical therapy/cardiac rehabilitation.  Patient will see Jason [name] for follow-up of his left ear tube.  I'll see back in 6 weeks      [name] MD      [address] [place] [place] [address] Office [phone]   Beeper [phone]  11/24/2016 10:59 AM

## 2016-12-02 ENCOUNTER — Telehealth: Payer: Self-pay | Admitting: Cardiology

## 2016-12-02 LAB — LIPID PANEL
Cholesterol: 127 mg/dL (ref <200)
HDL: 45 mg/dL (ref >40)
LDL CALC: 60 mg/dL (ref <100)
TRIGLYCERIDES: 108 mg/dL (ref <150)
Total CHOL/HDL Ratio: 2.8 Ratio (ref <5.0)
VLDL: 22 mg/dL (ref <30)

## 2016-12-02 LAB — HEPATIC FUNCTION PANEL
ALT: 26 U/L (ref 9–46)
AST: 23 U/L (ref 10–35)
Albumin: 3.6 g/dL (ref 3.6–5.1)
Alkaline Phosphatase: 95 U/L (ref 40–115)
BILIRUBIN DIRECT: 0.3 mg/dL — AB (ref <=0.2)
BILIRUBIN TOTAL: 1.2 mg/dL (ref 0.2–1.2)
Indirect Bilirubin: 0.9 mg/dL (ref 0.2–1.2)
TOTAL PROTEIN: 6.7 g/dL (ref 6.1–8.1)

## 2016-12-02 NOTE — Telephone Encounter (Signed)
New message     Patient is having a lot of dizziness, they are in the lobby at [facility] , his bp is running low  119/65

## 2016-12-02 NOTE — Telephone Encounter (Signed)
Spoke with pt-wife she states that pt has been c/o dizziness since last appt w/berge, PA she states that he was not c/o dizziness at that time, she states that pt saw ENT yesterday and he said that it was not ENT related-per wife. She states that BP has been running low 119/65'ish and would like to discuss with DR Jens Somrenshaw. No appt available for a month. Pt declines  ER visit at this time-per wife. Appt scheduled with Caryl AdaHoa Meng. Pt wife notified if sx worsen, or chest pain, or any new sx develop to go to the er

## 2016-12-05 ENCOUNTER — Telehealth: Payer: Self-pay | Admitting: *Deleted

## 2016-12-05 NOTE — Telephone Encounter (Signed)
Called pt to speak about results of cholesterol test. I went over the test results in detail with patient and wife, who verbalized understanding  Patient endorsing "dizziness"  -see  Triage call from Friday. I spoke with him and asked him if I could get further info on this - looks like he is scheduled for 6/21 w Wynema BirchHao to address this but I wanted to see if we might be able to intervene sooner.  He had me speak to wife who gave more details about his symptoms. She thinks his new meds are contributing to problem, going on now for about 3 weeks. Thinks this was happening as far back as his TCM visit on 11/01/16  From what the patient is telling his wife, the "dizziness" is mainly lightheadedness and is occurrent with standing and walking/ he is not having this problem, typically, when he is resting.  Metoprolol and lipitor are relatively new meds for him. Voices no myalgias w lipitor. Sample HRs given were 62-68. Systolic BP 113-124 "and sometimes lower". I did confirm that he's taking the correct dose of metoprolol  (1/2 25mg  tablet BID).  ENT has ruled out inner ear concerns/vertigo. But patient does c/o ear "feeling like it's in a drum".  Also endorses new sleeplessness/disrupted pattern.   I have tried to find sooner appt - can potentially double book on APP slot but no open appts currently - will recheck on this for a visit sooner than 6/21. Aware I will relay concerns to Marquandhris who saw him last. Will follow up w any med change recommendations.

## 2016-12-05 NOTE — Telephone Encounter (Signed)
-----   Message from Ok Anishristopher R Berge, NP sent at 12/02/2016  6:35 PM EDT ----- Lipids and lft's look good.  LDL is now @ goal (< 70).  Cont current dose of lipitor.

## 2016-12-06 NOTE — Telephone Encounter (Signed)
Mrs. Jason [name] is calling to let you know that on [name] last visit with Ward Givenshris Berge he increased Lipitor to 40mg  a day , and he has been dizzy ever since.  Also she said the lopressor states that it can cause the dizziness , tiredness . Please call

## 2016-12-06 NOTE — Telephone Encounter (Signed)
Advice has been communicated to caller. I've also recommended she call in a few days if his symptoms aren't improved so that we can investigate need for visit w pharmD. O/w, they will plan to follow up for APP visit as scheduled.

## 2016-12-06 NOTE — Telephone Encounter (Signed)
I suspect the  blocker may be contributing to orthostasis and fatigue.  Please have him hold his metoprolol to see if symptoms improve.  I don't think his lipitor is contributing to orthostasis.  Perhaps we can offer a BP check with the pharmacist  to assess orthostatic VS, though if he ends up feeling better off of metoprolol, he may just wish to wait for routine f/u on the 21st.

## 2016-12-15 ENCOUNTER — Ambulatory Visit (INDEPENDENT_AMBULATORY_CARE_PROVIDER_SITE_OTHER): Payer: Medicare Other | Admitting: Physician Assistant

## 2016-12-15 ENCOUNTER — Encounter: Payer: Self-pay | Admitting: Physician Assistant

## 2016-12-15 VITALS — BP 133/74 | HR 74 | Ht 64.0 in | Wt 141.0 lb

## 2016-12-15 DIAGNOSIS — R42 Dizziness and giddiness: Secondary | ICD-10-CM

## 2016-12-15 DIAGNOSIS — R946 Abnormal results of thyroid function studies: Secondary | ICD-10-CM | POA: Diagnosis not present

## 2016-12-15 DIAGNOSIS — I25810 Atherosclerosis of coronary artery bypass graft(s) without angina pectoris: Secondary | ICD-10-CM | POA: Diagnosis not present

## 2016-12-15 DIAGNOSIS — R7989 Other specified abnormal findings of blood chemistry: Secondary | ICD-10-CM

## 2016-12-15 DIAGNOSIS — D649 Anemia, unspecified: Secondary | ICD-10-CM

## 2016-12-15 DIAGNOSIS — Z79899 Other long term (current) drug therapy: Secondary | ICD-10-CM | POA: Diagnosis not present

## 2016-12-15 DIAGNOSIS — I255 Ischemic cardiomyopathy: Secondary | ICD-10-CM

## 2016-12-15 DIAGNOSIS — E785 Hyperlipidemia, unspecified: Secondary | ICD-10-CM | POA: Diagnosis not present

## 2016-12-15 DIAGNOSIS — R0602 Shortness of breath: Secondary | ICD-10-CM

## 2016-12-15 MED ORDER — ROSUVASTATIN CALCIUM 20 MG PO TABS: 20.0000 mg | ORAL_TABLET | Freq: Every day | ORAL | 11 refills | Status: DC

## 2016-12-15 NOTE — Progress Notes (Signed)
Cardiology Office Note    Date:  12/15/2016   ID:  Jason [name], DOB 02/09/1930, MRN [id number]  PCP:  Geoffry Paradise, MD  Cardiologist:  Dr. Jens Som   Chief Complaint  Patient presents with  . Follow-up    seen for Dr. Jens Som    History of Present Illness:  Jason [name] is a 83 y.o. male with PMH of HLD, ICM, GERD, anxiety, depression and recently diagnosed CAD. He was diagnosed with CAD when he presented to the hospital in April 2018 was anterior STEMI. He was subsequently taken for emergent cardiac catheterization and was found to have moderate left main, severe LAD, and the severe OM disease. EF was 35-45%. He was referred to cardiothoracic surgery for evaluation, he ultimately underwent CABG 3 (LIMA to LAD, SVG to OM2, SVG to LPDA). Postoperatively, he was anemic and placed on iron therapy and also had ileus. He eventually recovered and was discharged to rehabilitation. Unfortunately he developed a pneumonia and encephalopathy failure. He was readmitted, and treated with antibiotic with improvement of the mental and respiratory status. He was seen by Ward Givens for hospital follow-up on 11/01/2016, his Lipitor was increased.   Since the last time he was here, he has been having essentially persistent dizziness. Initially, he says he does not feel the room is spinning, later in the conversation he says the room occasionally spins. He continued to have significant chest discomfort only with coughing. Otherwise he denies any productive cough. He also complaining of persistent shortness of breath as well. His lung is clear. His daughter is concerned the Lipitor causing his issue. I will switch to Lipitor to 20 mg Crestor. EKG today show no obvious arrhythmia or significant bradycardia. As a recommendation of our nursing staff, he has stopped his metoprolol. His systolic blood pressure has been running in the 120 to 140s range at home, according to his wife, discontinuation of  metoprolol has not changed his blood pressure that much. Otherwise he symptom to have very poor functional ability. I am not entirely sure what was causing his persistent shortness breath and persistent dizziness. His dizziness does not appears to be episodic instead persisted throughout the day. It is worse when laying down and better when he sits up. His orthostatic vital sign was negative today, see below. I will obtain CBC, basic metabolic panel, TSH and a free T4 for initial workup for dizziness. I will hold off on any repeat echocardiogram at this time, I did not hear any pericardial rub on physical exam, he was also not tachycardic despite off beta blocker. Suspicion for significant pericardial effusion relatively on the lower side.  Laying 103/63 heart rate 69 Sitting 100/64 heart rate 74 Standing 104/64 heart rate 85 Standing 3 minutes 121/71 heart rate 75   Past Medical History:  Diagnosis Date  . Anxiety    takes Diazepam daily prn  . Arthritis   . Constipation   . Coronary artery disease    a. 09/2016 Ant STEMI/Cath: LM 60, LAD 87m/d (thrombotic), LCX nl, OM2 90/75, RCA nl, EF 35-45%;  b. 09/2016 CABG x 3 (LIMA->LAD, VG->OM2, VG->LPDA).  . Depression    takes Trazodone nightly  . Dysarthria   . Enlarged prostate   . GERD (gastroesophageal reflux disease)   . H/O hiatal hernia    takes Omeprazole daily  . Hemorrhoids   . History of colitis    many yrs ago  . History of colon polyps   . History of kidney  stones    passed on his own  . Hyperlipidemia    was on medication but has been off for a while  . Insomnia    takes Trazodone nightly  . Ischemic dilated cardiomyopathy (HCC)    a. 09/2016 LV Gram: EF 35-45%;  b. 09/2016 Echo: EF 45-50%, mild conc LVH, no rwma, Gr1 DD, mod AI, triv MR, mild TR, PASP 31mmHg.  . 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 Pneumonia 10/26/2016  . Protein calorie malnutrition (HCC) 10/2016  . Urinary frequency     Past Surgical History:    Procedure Laterality Date  . bilateral cataract surgery    . CARDIAC SURGERY    . CIRCUMCISION    . COLONOSCOPY    . CORONARY ARTERY BYPASS GRAFT N/A 10/07/2016   Procedure: CORONARY ARTERY BYPASS GRAFTING (CABG) x 3 , using internal mammary, and right greater saphenous vein harvested endoscopically SVG-PD, SVG-OM, LIMA-LAD;  Surgeon: Delight OvensEdward B Gerhardt, MD;  Location: [facility] OR;  Service: Open Heart Surgery;  Laterality: N/A;  . DIRECT LARYNGOSCOPY N/A 06/26/2013   Procedure: DIRECT LARYNGOSCOPY;  Surgeon: Christia Readingwight Bates, MD;  [facility];  Service: ENT;  Laterality: N/A;  . EAR CYST EXCISION N/A 04/05/2013   Procedure: CYST REMOVAL;  Surgeon: Melvenia BeamMitchell Gore, MD;  [facility];  Service: ENT;  Laterality: N/A;  . ESOPHAGOGASTRODUODENOSCOPY    . ESOPHAGOGASTRODUODENOSCOPY N/A 07/15/2013   Procedure: ESOPHAGOGASTRODUODENOSCOPY (EGD);  Surgeon: Cherylynn RidgesJames O Wyatt, MD;  [facility];  Service: General;  Laterality: N/A;  . HEMORRHOIDECTOMY WITH HEMORRHOID BANDING    . HERNIA REPAIR     double  . LEFT HEART CATH AND CORONARY ANGIOGRAPHY N/A 10/01/2016   Procedure: Left Heart Cath and Coronary Angiography;  Surgeon: Lyn RecordsHenry W Smith, MD;  [facility];  Service: Cardiovascular;  Laterality: N/A;  . PEG PLACEMENT N/A 07/15/2013   Procedure: PERCUTANEOUS ENDOSCOPIC GASTROSTOMY (PEG) PLACEMENT;  Surgeon: Cherylynn RidgesJames O Wyatt, MD;  Location: [facility] ENDOSCOPY;  Service: General;  Laterality: N/A;  . PERIPHERALLY INSERTED CENTRAL CATHETER INSERTION    . right knee arthroscopy    . SEPTOPLASTY N/A 04/05/2013   Procedure: SEPTOPLASTY;  Surgeon: Melvenia BeamMitchell Gore, MD;  [facility];  Service: ENT;  Laterality: N/A;  . SINUS ENDO W/FUSION N/A 07/03/2013   Procedure: ENDOSCOPIC SINUS SURGERY WITH FUSION NAVIGATION with By-Nasopharongeal Biopsy;  Surgeon: Christia Readingwight Bates, MD;  [facility];  Service: ENT;  Laterality: N/A;  . SINUS EXPLORATION Bilateral 07/11/2013   Procedure:  BEDSIDE  SINUS EXPLORATION ;   Surgeon: Christia Readingwight Bates, MD;  [facility];  Service: ENT;  Laterality: Bilateral;  . TEE WITHOUT CARDIOVERSION N/A 10/07/2016   Procedure: TRANSESOPHAGEAL ECHOCARDIOGRAM (TEE);  Surgeon: Delight OvensEdward B Gerhardt, MD;  [facility];  Service: Open Heart Surgery;  Laterality: N/A;  . TONSILLECTOMY      Current Medications: Outpatient Medications Prior to Visit  Medication Sig Dispense Refill  . acetaminophen (TYLENOL) 325 MG tablet Take 650 mg by mouth every 6 (six) hours as needed for moderate pain.    Marland Kitchen. aspirin EC 325 MG EC tablet Take 1 tablet (325 mg total) by mouth daily. 30 tablet 0  . feeding supplement, ENSURE ENLIVE, (ENSURE ENLIVE) LIQD Take 237 mLs by mouth 3 (three) times daily between meals. 237 mL 12  . pantoprazole (PROTONIX) 40 MG tablet Take 1 tablet (40 mg total) by mouth daily. 30 tablet 1  . polyvinyl alcohol (LIQUIFILM TEARS) 1.4 % ophthalmic solution Place 2 drops into  both eyes as needed for dry eyes. 15 mL 0  . atorvastatin (LIPITOR) 40 MG tablet Take 1 tablet (40 mg total) by mouth daily. 90 tablet 3  . metoprolol tartrate (LOPRESSOR) 25 MG tablet Take 0.5 tablets (12.5 mg total) by mouth 2 (two) times daily. (Patient not taking: Reported on 12/15/2016) 30 tablet 1   No facility-administered medications prior to visit.      Allergies:   Bee pollen; Tetanus toxoids; and Hydrocodone   Social History   Social History  . Marital status: Married    Spouse name: N/A  . Number of children: N/A  . Years of education: N/A   Social History Main Topics  . Smoking status: Former Smoker    Quit date: 07/01/1993  . Smokeless tobacco: Former Neurosurgeon    Types: Chew    Quit date: 12/29/2012     Comment: quit chewing tobacco several months ago and stopped smoking cigars yrs ago  . Alcohol use No  . Drug use: No  . Sexual activity: No   Other Topics Concern  . None   Social History Narrative  . None     Family History:  The patient's family history includes CAD in his  father.   ROS:   Please see the history of present illness.    ROS All other systems reviewed and are negative.   PHYSICAL EXAM:   VS:  BP 133/74   Pulse 74   Ht 5\' 4"  (1.626 m)   Wt 141 lb (64 kg)   BMI 24.20 kg/m    GEN: Well nourished, well developed, in no acute distress  HEENT: normal  Neck: no JVD, carotid bruits, or masses Cardiac: RRR; no murmurs, rubs, or gallops,no edema  Respiratory:  clear to auscultation bilaterally, normal work of breathing GI: soft, nontender, nondistended, + BS MS: no deformity or atrophy  Skin: warm and dry, no rash Neuro:  Alert and Oriented x 3, Strength and sensation are intact Psych: euthymic mood, full affect  Wt Readings from Last 3 Encounters:  12/15/16 141 lb (64 kg)  11/24/16 138 lb (62.6 kg)  11/01/16 142 lb (64.4 kg)      Studies/Labs Reviewed:   EKG:  EKG is ordered today.  The ekg ordered today demonstrates Normal sinus rhythm, mild T-wave inversion from V3 through V5, improved compared to the previous EKG.  Recent Labs: 10/01/2016: TSH 6.063 10/03/2016: B Natriuretic Peptide 269.4 10/26/2016: Magnesium 2.1 10/28/2016: BUN 13; Creatinine, Ser 1.05; Hemoglobin 9.6; Platelets 431; Potassium 3.8; Sodium 138 12/02/2016: ALT 26   Lipid Panel    Component Value Date/Time   CHOL 127 12/02/2016 0859   TRIG 108 12/02/2016 0859   HDL 45 12/02/2016 0859   CHOLHDL 2.8 12/02/2016 0859   VLDL 22 12/02/2016 0859   LDLCALC 60 12/02/2016 0859    Additional studies/ records that were reviewed today include:   Echo 10/03/2016 LV EF: 45% -   50%  Study Conclusions  - Left ventricle: The cavity size was normal. There was mild   concentric hypertrophy. Systolic function was mildly reduced. The   estimated ejection fraction was in the range of 45% to 50%. Wall   motion was normal; there were no regional wall motion   abnormalities. Doppler parameters are consistent with abnormal   left ventricular relaxation (grade 1 diastolic  dysfunction).   There was no evidence of elevated ventricular filling pressure by   Doppler parameters. - Aortic valve: Trileaflet; mildly thickened, mildly calcified  leaflets. There was moderate regurgitation. - Aortic root: The aortic root was normal in size. - Mitral valve: Structurally normal valve. There was trivial   regurgitation. - Right ventricle: The cavity size was normal. Wall thickness was   normal. Systolic function was normal. - Tricuspid valve: There was mild regurgitation. - Pulmonary arteries: Systolic pressure was mildly increased. PA   peak pressure: 31 mm Hg (S). - Inferior vena cava: The vessel was normal in size. - Pericardium, extracardiac: There was no pericardial effusion.  Impressions:  - There is hypokinesis of all of the apical segments. Overall LVEF   is mildly decreased at 45-50%.    Cath 10/01/2016 Conclusion    Acute coronary syndrome initially presenting with anterior ST elevation, spontaneously resolving by arrival in the emergency room.  Greater than 50% ostial left main associated with catheter damping and bradycardia with engagement and contrast injection.  90+ percent mid LAD, thrombus filled culprit lesion.  90% ostial large branching obtuse marginal. Her complex is dominant giving the PDA.  Normal, widely patent nondominant RCA.  Left ventricular dysfunction with mid anterior wall to the apical akinesis, EF 35-45%. The regional wall motion abnormality is due to stunned muscle from transient LAD occlusion.  RECOMMENDATIONS:   TCTS consultation (spoke to Dr. Tyrone Sage) for consideration of elective bypass surgery next week.  Resume IV heparin and continue IV nitroglycerin.  A loading dose of Brilinta was used before the decision to consider surgery was made.  Continue low-dose beta blocker therapy.    CABG 10/07/2016 PROCEDURE:  Procedure(s): CORONARY ARTERY BYPASS GRAFTING (CABG) x 3 , using internal mammary, and right  greater saphenous vein harvested endoscopically SVG-PD, SVG-OM, LIMA-LAD (N/A) TRANSESOPHAGEAL ECHOCARDIOGRAM (TEE) (N/A)  LIMA to LAD SVG to OM1 SVG to PDA   ASSESSMENT:    1. Dizziness   2. Anemia, unspecified type   3. Abnormal TSH   4. Medication management   5. SOB (shortness of breath)   6. Hyperlipidemia, unspecified hyperlipidemia type   7. Ischemic cardiomyopathy   8. Coronary artery disease involving coronary bypass graft of native heart without angina pectoris      PLAN:  In order of problems listed above:  1. Dizziness: Persistent dizziness throughout the day, initially stated the room is not spinning, later his wife says the room is spinning. Unclear cause, negative orthostatic vital signs despite the fact that he was having more symptoms with sitting than laying down. Obtain TSH, free T4, CBC and basic metabolic panel. Note, he had abnormal TSH before in April. He also had postoperative anemia as well. His metabolic panel to make sure there is no electrolyte abnormality to be responsible for his symptom. His daughter is concerned his symptom is related to Lipitor, I will switch to Crestor, but my suspicion is this is unlikely to change his symptom. Although pericardial effusion can potentially cause similar symptoms, however he does not have any tachycardia during today's office visit. I did not appreciate significant pericardial rub either on physical exam.  2. Persistent dyspnea: Started around the similar time of dizziness, going on for several weeks. However despite this, his O2 saturation has been stable during physical therapy. May be related to deconditioning.  3. CAD s/p CABG: Continued to have chest soreness with cough, however no angina. T-wave inversion in the anterior leads improving on today's EKG.  4. Ischemic heart failure: No sign of heart failure on physical exam.  5. Hyperlipidemia: Switch from Lipitor to Crestor today.    Medication Adjustments/Labs  and Tests Ordered: Current medicines are reviewed at length with the patient today.  Concerns regarding medicines are outlined above.  Medication changes, Labs and Tests ordered today are listed in the Patient Instructions below. Patient Instructions  Medication Instructions: STOP taking the (Atorvastatin) Lipitor START taking the Rosuvastatin (Crestor) 20 mg tablet daily.  Labwork: Please have the following lab work done: BMET, CBC, TSH, Free T4   Follow-Up: Keep your follow up with Dr. Jens Som.   If you need a refill on your cardiac medications before your next appointment, please call your pharmacy.      Ramond Dial, [place]  12/15/2016 5:07 PM    [facility] [address] [place], [place], [place]  [address] Phone: [phone]; Fax: [phone]

## 2016-12-15 NOTE — Patient Instructions (Signed)
Medication Instructions: STOP taking the (Atorvastatin) Lipitor START taking the Rosuvastatin (Crestor) 20 mg tablet daily.  Labwork: Please have the following lab work done: BMET, CBC, TSH, Free T4   Follow-Up: Keep your follow up with Dr. Jens Somrenshaw.   If you need a refill on your cardiac medications before your next appointment, please call your pharmacy.

## 2016-12-17 LAB — BASIC METABOLIC PANEL
BUN / CREAT RATIO: 16 (ref 10–24)
BUN: 18 mg/dL (ref 8–27)
CO2: 23 mmol/L (ref 20–29)
CREATININE: 1.11 mg/dL (ref 0.76–1.27)
Calcium: 9.6 mg/dL (ref 8.6–10.2)
Chloride: 102 mmol/L (ref 96–106)
GFR calc non Af Amer: 61 mL/min/{1.73_m2} (ref >59)
GFR, EST AFRICAN AMERICAN: 70 mL/min/{1.73_m2} (ref >59)
Glucose: 102 mg/dL — ABNORMAL HIGH (ref 65–99)
Potassium: 4.8 mmol/L (ref 3.5–5.2)
SODIUM: 140 mmol/L (ref 134–144)

## 2016-12-17 LAB — T4, FREE: FREE T4: 1.21 ng/dL (ref 0.82–1.77)

## 2016-12-17 LAB — CBC
HEMATOCRIT: 36.7 % — AB (ref 37.5–51.0)
Hemoglobin: 12.2 g/dL — ABNORMAL LOW (ref 13.0–17.7)
MCH: 28.1 pg (ref 26.6–33.0)
MCHC: 33.2 g/dL (ref 31.5–35.7)
MCV: 85 fL (ref 79–97)
PLATELETS: 206 10*3/uL (ref 150–379)
RBC: 4.34 x10E6/uL (ref 4.14–5.80)
RDW: 15.1 % (ref 12.3–15.4)
WBC: 8.3 10*3/uL (ref 3.4–10.8)

## 2016-12-17 LAB — TSH: TSH: 4.26 u[IU]/mL (ref 0.450–4.500)

## 2016-12-20 NOTE — Progress Notes (Signed)
Ok to temporarily hold crestor for 2 weeks to see if symptom improve, if no change, would recommend restart

## 2016-12-21 ENCOUNTER — Other Ambulatory Visit: Payer: Self-pay | Admitting: Physician Assistant

## 2016-12-22 NOTE — Progress Notes (Signed)
HPI: Follow-up coronary artery disease. Patient was admitted April 2018 with anterior ST elevation myocardial infarction. Cardiac catheterization revealed a 50% left main, 90% mid LAD with thrombus, 90% ostial obtuse marginal and ejection fraction 35-45%. Preoperative carotid Dopplers showed no carotid disease. Echocardiogram showed ejection fraction 45-50%, moderate aortic insufficiency and mild tricuspid regurgitation. Patient's sclerae underwent coronary artery bypass graft with a LIMA to the LAD, saphenous vein graft to the second marginal and saphenous vein graft to the PDA. Patient readmitted with encephalopathy and pneumonia. Since last seen patient denies dyspnea, chest pain, pedal edema or syncope. He has multiple other complaints including dizziness 24 hours daily worse with sitting and standing. He also complains of constipation. His wife is concerned about depression.  Current Outpatient Prescriptions  Medication Sig Dispense Refill  . aspirin 81 MG tablet Take 81 mg by mouth daily.     No current facility-administered medications for this visit.      Past Medical History:  Diagnosis Date  . Anxiety    takes Diazepam daily prn  . Arthritis   . Constipation   . Coronary artery disease    a. 09/2016 Ant STEMI/Cath: LM 60, LAD 1877m/d (thrombotic), LCX nl, OM2 90/75, RCA nl, EF 35-45%;  b. 09/2016 CABG x 3 (LIMA->LAD, VG->OM2, VG->LPDA).  . Depression    takes Trazodone nightly  . Dysarthria   . Enlarged prostate   . GERD (gastroesophageal reflux disease)   . H/O hiatal hernia    takes Omeprazole daily  . Hemorrhoids   . History of colitis    many yrs ago  . History of colon polyps   . History of kidney stones    passed on his own  . Hyperlipidemia    was on medication but has been off for a while  . Insomnia    takes Trazodone nightly  . Ischemic dilated cardiomyopathy (HCC)    a. 09/2016 LV Gram: EF 35-45%;  b. 09/2016 Echo: EF 45-50%, mild conc LVH, no rwma, Gr1  DD, mod AI, triv MR, mild TR, PASP 31mmHg.  . 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 Pneumonia 10/26/2016  . Protein calorie malnutrition (HCC) 10/2016  . Urinary frequency     Past Surgical History:  Procedure Laterality Date  . bilateral cataract surgery    . CARDIAC SURGERY    . CIRCUMCISION    . COLONOSCOPY    . CORONARY ARTERY BYPASS GRAFT N/A 10/07/2016   Procedure: CORONARY ARTERY BYPASS GRAFTING (CABG) x 3 , using internal mammary, and right greater saphenous vein harvested endoscopically SVG-PD, SVG-OM, LIMA-LAD;  Surgeon: Delight OvensEdward B Gerhardt, MD;  Location: [facility] OR;  Service: Open Heart Surgery;  Laterality: N/A;  . DIRECT LARYNGOSCOPY N/A 06/26/2013   Procedure: DIRECT LARYNGOSCOPY;  Surgeon: Christia Readingwight Bates, MD;  [facility];  Service: ENT;  Laterality: N/A;  . EAR CYST EXCISION N/A 04/05/2013   Procedure: CYST REMOVAL;  Surgeon: Melvenia BeamMitchell Gore, MD;  [facility];  Service: ENT;  Laterality: N/A;  . ESOPHAGOGASTRODUODENOSCOPY    . ESOPHAGOGASTRODUODENOSCOPY N/A 07/15/2013   Procedure: ESOPHAGOGASTRODUODENOSCOPY (EGD);  Surgeon: Cherylynn RidgesJames O Wyatt, MD;  [facility];  Service: General;  Laterality: N/A;  . HEMORRHOIDECTOMY WITH HEMORRHOID BANDING    . HERNIA REPAIR     double  . LEFT HEART CATH AND CORONARY ANGIOGRAPHY N/A 10/01/2016   Procedure: Left Heart Cath and Coronary Angiography;  Surgeon: Lyn RecordsHenry W Smith, MD;  [facility];  Service: Cardiovascular;  Laterality: N/A;  . PEG PLACEMENT N/A 07/15/2013   Procedure: PERCUTANEOUS ENDOSCOPIC GASTROSTOMY (PEG) PLACEMENT;  Surgeon: Cherylynn Ridges, MD;  Location: [facility] ENDOSCOPY;  Service: General;  Laterality: N/A;  . PERIPHERALLY INSERTED CENTRAL CATHETER INSERTION    . right knee arthroscopy    . SEPTOPLASTY N/A 04/05/2013   Procedure: SEPTOPLASTY;  Surgeon: Melvenia Beam, MD;  Location: [facility] OR;  Service: ENT;  Laterality: N/A;  . SINUS ENDO W/FUSION N/A 07/03/2013   Procedure: ENDOSCOPIC SINUS SURGERY WITH FUSION  NAVIGATION with By-Nasopharongeal Biopsy;  Surgeon: Christia Reading, MD;  Location: [facility] OR;  Service: ENT;  Laterality: N/A;  . SINUS EXPLORATION Bilateral 07/11/2013   Procedure:  BEDSIDE  SINUS EXPLORATION ;  Surgeon: Christia Reading, MD;  Location: [facility] OR;  Service: ENT;  Laterality: Bilateral;  . TEE WITHOUT CARDIOVERSION N/A 10/07/2016   Procedure: TRANSESOPHAGEAL ECHOCARDIOGRAM (TEE);  Surgeon: [name], MD;  Location: [facility] OR;  Service: Open Heart Surgery;  Laterality: N/A;  . TONSILLECTOMY      Social History   Social History  . Marital status: Married    Spouse name: N/A  . Number of children: N/A  . Years of education: N/A   Occupational History  . Not on file.   Social History Main Topics  . Smoking status: Former Smoker    Quit date: 07/01/1993  . Smokeless tobacco: Former Neurosurgeon    Types: Chew    Quit date: 12/29/2012     Comment: quit chewing tobacco several months ago and stopped smoking cigars yrs ago  . Alcohol use No  . Drug use: No  . Sexual activity: No   Other Topics Concern  . Not on file   Social History Narrative  . No narrative on file    Family History  Problem Relation Age of Onset  . CAD Father     ROS: Weakness, decreased appetite, dizzy, constipated, but no fevers or chills, productive cough, hemoptysis, dysphasia, odynophagia, melena, hematochezia, dysuria, hematuria, rash, seizure activity, orthopnea, PND, pedal edema, claudication. Remaining systems are negative.  Physical Exam: Well-developed frail in no acute distress.  Skin is warm and dry.  HEENT is normal.  Neck is supple.  Chest is clear to auscultation with normal expansion. Sternotomy without evidence of infection Cardiovascular exam is regular rate and rhythm.  Abdominal exam nontender or distended. No masses palpated. Extremities show no edema. neuro grossly intact   A/P  1 Coronary artery disease-patient is status post coronary artery bypass and graft. Continue aspirin;  resume statin.  2 hyperlipidemia-resume crestor 20 mg daily; check lipids and liver 4 weeks.  3 ischemic cardiomyopathy-LV function is mildly reduced. However I'm hesitant to add back beta blockade or ACE inhibitor as his blood pressure is borderline and he is complaining of dizziness. We will consider low-dose beta blocker in the future.  4 Moderate aortic insufficiency-patient will need follow-up echoes in the future.  5 dizziness-etiology unclear. This has been persistent since his surgery. He did have a head CT on May 2 that showed no acute intracranial abnormalities. He saw ENT and evaluation apparently was unrevealing. I do not have full records available. Some of his symptoms sound orthostatic and I stressed the importance of increased by mouth intake. If they persist he may need neurology evaluation.  6 question competence-his wife asked about his competence to make a will/trust. He answers all of my questions appropriately and appears competent to make decisions to my evaluation. I asked him to also address this  with primary care.  Olga Millers, MD

## 2017-01-04 ENCOUNTER — Ambulatory Visit (INDEPENDENT_AMBULATORY_CARE_PROVIDER_SITE_OTHER): Payer: Medicare Other | Admitting: Cardiology

## 2017-01-04 ENCOUNTER — Encounter: Payer: Self-pay | Admitting: Cardiology

## 2017-01-04 VITALS — BP 112/68 | HR 79 | Ht 64.0 in | Wt 137.0 lb

## 2017-01-04 DIAGNOSIS — E7800 Pure hypercholesterolemia, unspecified: Secondary | ICD-10-CM

## 2017-01-04 DIAGNOSIS — R42 Dizziness and giddiness: Secondary | ICD-10-CM | POA: Diagnosis not present

## 2017-01-04 DIAGNOSIS — I2510 Atherosclerotic heart disease of native coronary artery without angina pectoris: Secondary | ICD-10-CM | POA: Diagnosis not present

## 2017-01-04 MED ORDER — ROSUVASTATIN CALCIUM 20 MG PO TABS: 20.0000 mg | ORAL_TABLET | Freq: Every day | ORAL | 3 refills | Status: DC

## 2017-01-04 NOTE — Patient Instructions (Signed)
Medication Instructions:   RESTART ROSUVASTATIN 20 MG ONCE DAILY  Labwork:  Your physician recommends that you return for lab work in: 4 WEEKS=DO NOT EAT PRIOR TO LAB WORK  Follow-Up:  Your physician recommends that you schedule a follow-up appointment in: 3 MONTHS WITH DR Jens SomRENSHAW

## 2017-01-05 ENCOUNTER — Other Ambulatory Visit: Payer: Self-pay | Admitting: *Deleted

## 2017-01-05 ENCOUNTER — Encounter: Payer: Self-pay | Admitting: Cardiothoracic Surgery

## 2017-01-05 ENCOUNTER — Ambulatory Visit (INDEPENDENT_AMBULATORY_CARE_PROVIDER_SITE_OTHER): Payer: Self-pay | Admitting: Cardiothoracic Surgery

## 2017-01-05 VITALS — BP 126/72 | HR 76 | Resp 20 | Ht 64.0 in | Wt 138.0 lb

## 2017-01-05 DIAGNOSIS — Z951 Presence of aortocoronary bypass graft: Secondary | ICD-10-CM

## 2017-01-05 DIAGNOSIS — R42 Dizziness and giddiness: Secondary | ICD-10-CM

## 2017-01-05 NOTE — Progress Notes (Signed)
[address]       [place] [address]             [phone]      TYSIN SALADA [place] Medical Record #[id number] Date of Birth: 03-08-1930  Referring: Lyn Records, MD Primary Care: Geoffry Paradise, MD  Chief Complaint:   POST OP FOLLOW UP 10/07/2016    PREOPERATIVE DIAGNOSIS:  Coronary occlusive disease with recent anterior myocardial infarction, ST elevation myocardial infarction. POSTOPERATIVE DIAGNOSIS:  Coronary occlusive disease with recent anterior myocardial infarction, ST elevation myocardial infarction. SURGICAL PROCEDURE:  Coronary artery bypass grafting x3 with left internal mammary to the left anterior descending coronary artery, reverse saphenous vein graft to the second obtuse marginal coronary artery, reverse saphenous vein graft to the posterior descending coronary artery rising off the distal circumflex with right thigh greater saphenous endo vein harvesting. SURGEON:  Sheliah Plane, MD.  History of Present Illness:     Patient returns office in follow-up after coronary artery bypass grafting and acute myocardial infarction in April 2018. The patient has been very slow to progress and has required significant and home physical therapy. He continues to complain of dizziness. He recently saw ENT. Has a history of osteomyelitis of the skull, at the time he was unable to swallow or eat and it up with a PEG tube in 2015.      Past Medical History:  Diagnosis Date  . Anxiety    takes Diazepam daily prn  . Arthritis   . Constipation   . Coronary artery disease    a. 09/2016 Ant STEMI/Cath: LM 60, LAD 40m/d (thrombotic), LCX nl, OM2 90/75, RCA nl, EF 35-45%;  b. 09/2016 CABG x 3 (LIMA->LAD, VG->OM2, VG->LPDA).  . Depression    takes Trazodone nightly  . Dysarthria   . Enlarged prostate   . GERD (gastroesophageal reflux disease)   . H/O hiatal hernia    takes Omeprazole daily  . Hemorrhoids   . History of colitis    many  yrs ago  . History of colon polyps   . History of kidney stones    passed on his own  . Hyperlipidemia    was on medication but has been off for a while  . Insomnia    takes Trazodone nightly  . Ischemic dilated cardiomyopathy (HCC)    a. 09/2016 LV Gram: EF 35-45%;  b. 09/2016 Echo: EF 45-50%, mild conc LVH, no rwma, Gr1 DD, mod AI, triv MR, mild TR, PASP .  . 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 Pneumonia 10/26/2016  . Protein calorie malnutrition (HCC) 10/2016  . Urinary frequency      History  Smoking Status  . Former Smoker  . Quit date: 07/01/1993  Smokeless Tobacco  . Former Neurosurgeon  . Types: Chew  . Quit date: 12/29/2012    Comment: quit chewing tobacco several months ago and stopped smoking cigars yrs ago    History  Alcohol Use No     Allergies  Allergen Reactions  . Bee Pollen Anaphylaxis  . Tetanus Toxoids Swelling and Other (See Comments)    SWELLING REACTION UNSPECIFIED ? LOCAL REACTION ?  Marland Kitchen Tramadol Other (See Comments)    confusion    Current Outpatient Prescriptions  Medication Sig Dispense Refill  . aspirin 81 MG tablet Take 81 mg by mouth daily.    . rosuvastatin (CRESTOR) 20 MG tablet Take 1 tablet (20 mg total) by mouth daily. 90 tablet 3  No current facility-administered medications for this visit.        Physical Exam: BP 126/72   Pulse 76   Resp 20   Ht 5\' 4"  (1.626 m)   Wt 138 lb (62.6 kg)   SpO2 97% Comment: RA  BMI 23.69 kg/m   General appearance: alert and cooperative Neurologic: intact Heart: regular rate and rhythm, S1, S2 normal, no murmur, click, rub or gallop Lungs: clear to auscultation bilaterally Abdomen: soft, non-tender; bowel sounds normal; no masses,  no organomegaly Extremities: extremities normal, atraumatic, no cyanosis or edema and Homans sign is negative, no sign of DVT Wound: Sternum is stable and well healed vein harvest sites also healing well patient has no pedal edema looking in the left ear the    Diagnostic Studies & Laboratory data:     Recent Radiology Findings:   No results found.    Recent Lab Findings: Lab Results  Component Value Date   WBC 8.3 12/16/2016   HGB 12.2 (L) 12/16/2016   HCT 36.7 (L) 12/16/2016   PLT 206 12/16/2016   GLUCOSE 102 (H) 12/16/2016   CHOL 127 12/02/2016   TRIG 108 12/02/2016   HDL 45 12/02/2016   LDLCALC 60 12/02/2016   ALT 26 12/02/2016   AST 23 12/02/2016   NA 140 12/16/2016   K 4.8 12/16/2016   CL 102 12/16/2016   CREATININE 1.11 12/16/2016   BUN 18 12/16/2016   CO2 23 12/16/2016   TSH 4.260 12/16/2016   INR 1.55 10/07/2016   HGBA1C 5.3 10/01/2016      Assessment / Plan:    From cardiac surgery standpoint the patient is making progress postoperatively, He still limited by dizziness, but is unclear if he once any further evaluation. With his previous history of osteomyelitis of the skull and now with persistent dizziness I recommended to he and his family that we obtain an MRI of the brain and referral to neurology for evaluation of dizziness. We'll make appropriate appointments for him  I've not made him a return appointment to see me as he's closely followed by cardiology.    Delight OvensEdward B Mcgregor Tinnon MD      [address] [place] [id number] Office [phone]   Beeper [phone]  01/05/2017 3:16 PM

## 2017-01-09 NOTE — Addendum Note (Signed)
Addendum  created 01/09/17 1748 by Nakai Pollio, MD   Sign clinical note    

## 2017-01-10 ENCOUNTER — Encounter: Payer: Self-pay | Admitting: Neurology

## 2017-01-10 ENCOUNTER — Other Ambulatory Visit: Payer: Self-pay | Admitting: *Deleted

## 2017-01-10 DIAGNOSIS — F410 Panic disorder [episodic paroxysmal anxiety] without agoraphobia: Secondary | ICD-10-CM

## 2017-01-10 MED ORDER — DIAZEPAM 2 MG PO TABS: 2.0000 mg | ORAL_TABLET | Freq: Once | ORAL | 0 refills | Status: AC

## 2017-01-15 ENCOUNTER — Ambulatory Visit
Admission: RE | Admit: 2017-01-15 | Discharge: 2017-01-15 | Disposition: A | Discharge status: Home / Self Care | Payer: Medicare Other | Source: Ambulatory Visit | Attending: Cardiothoracic Surgery | Admitting: Cardiothoracic Surgery

## 2017-01-15 DIAGNOSIS — R42 Dizziness and giddiness: Secondary | ICD-10-CM

## 2017-01-15 MED ORDER — GADOBENATE DIMEGLUMINE 529 MG/ML IV SOLN
12.0000 mL | Freq: Once | INTRAVENOUS | Status: AC | PRN
  Administered 2017-01-15: 12 mL via INTRAVENOUS

## 2017-01-18 ENCOUNTER — Encounter: Payer: Self-pay | Admitting: Cardiothoracic Surgery

## 2017-01-30 ENCOUNTER — Emergency Department (HOSPITAL_COMMUNITY)
Admission: EM | Admit: 2017-01-30 | Discharge: 2017-01-30 | Disposition: A | Discharge status: Home / Self Care | Payer: Medicare Other | Attending: Emergency Medicine | Admitting: Emergency Medicine

## 2017-01-30 ENCOUNTER — Emergency Department (HOSPITAL_COMMUNITY): Payer: Medicare Other

## 2017-01-30 DIAGNOSIS — I252 Old myocardial infarction: Secondary | ICD-10-CM | POA: Insufficient documentation

## 2017-01-30 DIAGNOSIS — Z79899 Other long term (current) drug therapy: Secondary | ICD-10-CM | POA: Diagnosis not present

## 2017-01-30 DIAGNOSIS — F419 Anxiety disorder, unspecified: Secondary | ICD-10-CM | POA: Insufficient documentation

## 2017-01-30 DIAGNOSIS — R42 Dizziness and giddiness: Secondary | ICD-10-CM

## 2017-01-30 DIAGNOSIS — Z7982 Long term (current) use of aspirin: Secondary | ICD-10-CM | POA: Insufficient documentation

## 2017-01-30 DIAGNOSIS — I2510 Atherosclerotic heart disease of native coronary artery without angina pectoris: Secondary | ICD-10-CM | POA: Insufficient documentation

## 2017-01-30 DIAGNOSIS — Z951 Presence of aortocoronary bypass graft: Secondary | ICD-10-CM | POA: Insufficient documentation

## 2017-01-30 DIAGNOSIS — Z87891 Personal history of nicotine dependence: Secondary | ICD-10-CM | POA: Diagnosis not present

## 2017-01-30 DIAGNOSIS — Z91030 Bee allergy status: Secondary | ICD-10-CM | POA: Insufficient documentation

## 2017-01-30 LAB — CBC WITH DIFFERENTIAL/PLATELET
BASOS ABS: 0.1 10*3/uL (ref 0.0–0.1)
BASOS PCT: 1 %
Eosinophils Absolute: 0.3 10*3/uL (ref 0.0–0.7)
Eosinophils Relative: 3 %
HEMATOCRIT: 39.5 % (ref 39.0–52.0)
HEMOGLOBIN: 12.9 g/dL — AB (ref 13.0–17.0)
LYMPHS PCT: 19 %
Lymphs Abs: 1.4 10*3/uL (ref 0.7–4.0)
MCH: 27.7 pg (ref 26.0–34.0)
MCHC: 32.7 g/dL (ref 30.0–36.0)
MCV: 84.9 fL (ref 78.0–100.0)
MONOS PCT: 9 %
Monocytes Absolute: 0.7 10*3/uL (ref 0.1–1.0)
NEUTROS ABS: 5 10*3/uL (ref 1.7–7.7)
NEUTROS PCT: 68 %
Platelets: 179 10*3/uL (ref 150–400)
RBC: 4.65 MIL/uL (ref 4.22–5.81)
RDW: 15.7 % — ABNORMAL HIGH (ref 11.5–15.5)
WBC: 7.4 10*3/uL (ref 4.0–10.5)

## 2017-01-30 LAB — COMPREHENSIVE METABOLIC PANEL
ALBUMIN: 3.4 g/dL — AB (ref 3.5–5.0)
ALK PHOS: 74 U/L (ref 38–126)
ALT: 27 U/L (ref 17–63)
AST: 24 U/L (ref 15–41)
Anion gap: 7 (ref 5–15)
BILIRUBIN TOTAL: 1.3 mg/dL — AB (ref 0.3–1.2)
BUN: 25 mg/dL — AB (ref 6–20)
CALCIUM: 9 mg/dL (ref 8.9–10.3)
CO2: 23 mmol/L (ref 22–32)
CREATININE: 1.18 mg/dL (ref 0.61–1.24)
Chloride: 108 mmol/L (ref 101–111)
GFR calc Af Amer: >60 mL/min (ref >60)
GFR, EST NON AFRICAN AMERICAN: 55 mL/min — AB (ref >60)
GLUCOSE: 106 mg/dL — AB (ref 65–99)
Potassium: 3.9 mmol/L (ref 3.5–5.1)
Sodium: 138 mmol/L (ref 135–145)
TOTAL PROTEIN: 6.4 g/dL — AB (ref 6.5–8.1)

## 2017-01-30 LAB — URINALYSIS, ROUTINE W REFLEX MICROSCOPIC
BILIRUBIN URINE: NEGATIVE
GLUCOSE, UA: NEGATIVE mg/dL
HGB URINE DIPSTICK: NEGATIVE
KETONES UR: NEGATIVE mg/dL
Leukocytes, UA: NEGATIVE
Nitrite: NEGATIVE
PROTEIN: NEGATIVE mg/dL
Specific Gravity, Urine: 1.017 (ref 1.005–1.030)
pH: 7 (ref 5.0–8.0)

## 2017-01-30 LAB — CBG MONITORING, ED: GLUCOSE-CAPILLARY: 112 mg/dL — AB (ref 65–99)

## 2017-01-30 MED ORDER — SODIUM CHLORIDE 0.9 % IV BOLUS (SEPSIS)
1000.0000 mL | Freq: Once | INTRAVENOUS | Status: AC
  Administered 2017-01-30: 1000 mL via INTRAVENOUS

## 2017-01-30 NOTE — ED Notes (Signed)
ED Provider at bedside. 

## 2017-01-30 NOTE — ED Triage Notes (Addendum)
Pt arrived via [facility] from home c/o dizziness (room spinning, per ems) Pt has hx of dizziness, beginning 4 months ago. Per family, pt has recently become more agitated and paranoid. EMS found pt to have no weakness, nor deficits. Pt recently placed on xanax and lexapro. VS on scene: 114/62. Hr 88, resp 16, spo2 95% RA, CBG 122, normal sinus EKG. Pt is alert and oriented x4. Upon ED arrival, pt was agitated and asking for water. Family at bedside.

## 2017-01-30 NOTE — Discharge Instructions (Signed)
Please follow with your primary care doctor in the next 2 days for a check-up. They must obtain records for further management.  ° °Do not hesitate to return to the Emergency Department for any new, worsening or concerning symptoms.  ° °

## 2017-01-30 NOTE — ED Provider Notes (Signed)
  Face-to-face evaluation   History: He is here for evaluation of nervousness, agitation, weight loss, and "dehydration."  His doctor is treating him for depression with "Xanax and Lexapro."  He has a follow-up appointment with neurology, in October 2018.  He has recently been seen by cardiology, cardiovascular surgery, and ear nose and throat physician.  Physical exam: Alert elderly man.  No dysarthria or aphasia.  Moves all extremities equally.  Medical screening examination/treatment/procedure(s) were conducted as a shared visit with non-physician practitioner(s) and myself.  I personally evaluated the patient during the encounter   Mancel BaleWentz, Rushawn Capshaw, MD 02/01/17 1332

## 2017-01-30 NOTE — ED Provider Notes (Signed)
[facility]-EMERGENCY DEPT Provider Note   CSN: [id number] Arrival date & time: 01/30/17  1313     History   Chief Complaint Chief Complaint  Patient presents with  . Dizziness   HPI   Blood pressure 122/70, pulse 68, temperature 97.6 F (36.4 C), temperature source Oral, resp. rate 18, SpO2 97 %.  Jason [name] is a 83 y.o. male who lives at home, accompanied by wife and daughter complaining of worsening dizziness, lightheadedness room spinning over the course of the last 2 days also reporting increasing anxiety with fidgetiness, pacing, loss of sleep. He is being weaned down from 0.5 mg of Ativan and started on Lexapro 2 weeks ago. His agitation has continued to worsen over the course of the last 2 days. He is eating and drinking much less than normal, he did have an episode of bright red blood per rectum but he states that he is constipated and has a history of hemorrhoids. 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 He had an MRI 2 weeks ago which showed some "fluid in the mastoid" which ENT has been evaluating him for. His primary care, Dr. Jacky Kindle has been managing his agitation. He is status post CABG 4 months ago   Past Medical History:  Diagnosis Date  . Anxiety    takes Diazepam daily prn  . Arthritis   . Constipation   . Coronary artery disease    a. 09/2016 Ant STEMI/Cath: LM 60, LAD 64m/d (thrombotic), LCX nl, OM2 90/75, RCA nl, EF 35-45%;  b. 09/2016 CABG x 3 (LIMA->LAD, VG->OM2, VG->LPDA).  . Depression    takes Trazodone nightly  . Dysarthria   . Enlarged prostate   . GERD (gastroesophageal reflux disease)   . H/O hiatal hernia    takes Omeprazole daily  . Hemorrhoids   . History of colitis    many yrs ago  . History of colon polyps   . History of kidney stones    passed on his own  . Hyperlipidemia    was on medication but has been off for a while  . Insomnia    takes Trazodone nightly  . Ischemic dilated  cardiomyopathy (HCC)    a. 09/2016 LV Gram: EF 35-45%;  b. 09/2016 Echo: EF 45-50%, mild conc LVH, no rwma, Gr1 DD, mod AI, triv MR, mild TR, PASP .  . 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 Pneumonia 10/26/2016  . Protein calorie malnutrition (HCC) 10/2016  . Urinary frequency     Patient Active Problem List   Diagnosis Date Noted  . Other hyperlipidemia   . Acute encephalopathy 10/26/2016  . HCAP (healthcare-associated pneumonia) 10/26/2016  . 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 ACS (acute coronary syndrome) (HCC) 10/01/2016  . ST elevation myocardial infarction (STEMI) (HCC)   . Dysarthria 06/02/2014  . Gastrostomy in place [facility]) 10/11/2013  . Osteomyelitis (HCC) 08/07/2013  . Acute sinusitis 07/25/2013  . Osteomyelitis of skull (HCC) 07/25/2013  . Hyperglycemia 07/25/2013  . Normocytic anemia 07/25/2013  . Dysphagia 07/25/2013  . Protein-calorie malnutrition, severe (HCC) 07/02/2013  . Loss of weight 03/24/2013  . GERD (gastroesophageal reflux disease) 03/24/2013    Past Surgical History:  Procedure Laterality Date  . bilateral cataract surgery    . CARDIAC SURGERY    . CIRCUMCISION    . COLONOSCOPY    . CORONARY ARTERY BYPASS GRAFT N/A 10/07/2016   Procedure: CORONARY ARTERY BYPASS GRAFTING (CABG) x 3 , using internal mammary,  and right greater saphenous vein harvested endoscopically SVG-PD, SVG-OM, LIMA-LAD;  Surgeon: [name], MD;  Location: [facility] OR;  Service: Open Heart Surgery;  Laterality: N/A;  . DIRECT LARYNGOSCOPY N/A 06/26/2013   Procedure: DIRECT LARYNGOSCOPY;  Surgeon: Christia Reading, MD;  Location: [facility] OR;  Service: ENT;  Laterality: N/A;  . EAR CYST EXCISION N/A 04/05/2013   Procedure: CYST REMOVAL;  Surgeon: Melvenia Beam, MD;  Location: [facility] OR;  Service: ENT;  Laterality: N/A;  . ESOPHAGOGASTRODUODENOSCOPY    . ESOPHAGOGASTRODUODENOSCOPY N/A 07/15/2013   Procedure: ESOPHAGOGASTRODUODENOSCOPY (EGD);  Surgeon: Cherylynn Ridges, MD;  Location: [facility] ENDOSCOPY;   Service: General;  Laterality: N/A;  . HEMORRHOIDECTOMY WITH HEMORRHOID BANDING    . HERNIA REPAIR     double  . LEFT HEART CATH AND CORONARY ANGIOGRAPHY N/A 10/01/2016   Procedure: Left Heart Cath and Coronary Angiography;  Surgeon: Lyn Records, MD;  Location: [facility] INVASIVE CV LAB;  Service: Cardiovascular;  Laterality: N/A;  . PEG PLACEMENT N/A 07/15/2013   Procedure: PERCUTANEOUS ENDOSCOPIC GASTROSTOMY (PEG) PLACEMENT;  Surgeon: Cherylynn Ridges, MD;  Location: [facility] ENDOSCOPY;  Service: General;  Laterality: N/A;  . PERIPHERALLY INSERTED CENTRAL CATHETER INSERTION    . right knee arthroscopy    . SEPTOPLASTY N/A 04/05/2013   Procedure: SEPTOPLASTY;  Surgeon: Melvenia Beam, MD;  Location: [facility] OR;  Service: ENT;  Laterality: N/A;  . SINUS ENDO W/FUSION N/A 07/03/2013   Procedure: ENDOSCOPIC SINUS SURGERY WITH FUSION NAVIGATION with By-Nasopharongeal Biopsy;  Surgeon: Christia Reading, MD;  Location: [facility] OR;  Service: ENT;  Laterality: N/A;  . SINUS EXPLORATION Bilateral 07/11/2013   Procedure:  BEDSIDE  SINUS EXPLORATION ;  Surgeon: Christia Reading, MD;  Location: [facility] OR;  Service: ENT;  Laterality: Bilateral;  . TEE WITHOUT CARDIOVERSION N/A 10/07/2016   Procedure: TRANSESOPHAGEAL ECHOCARDIOGRAM (TEE);  Surgeon: [name], MD;  Location: [facility] OR;  Service: Open Heart Surgery;  Laterality: N/A;  . TONSILLECTOMY         Home Medications    Prior to Admission medications   Medication Sig Start Date End Date Taking? Authorizing Provider  aspirin 81 MG tablet Take 81 mg by mouth daily.    [provider]  rosuvastatin (CRESTOR) 20 MG tablet Take 1 tablet (20 mg total) by mouth daily. 01/04/17 04/04/17  Lewayne Bunting, MD    Family History Family History  Problem Relation Age of Onset  . CAD Father     Social History Social History  Substance Use Topics  . Smoking status: Former Smoker    Quit date: 07/01/1993  . Smokeless tobacco: Former Neurosurgeon    Types: Chew    Quit date: 12/29/2012      Comment: quit chewing tobacco several months ago and stopped smoking cigars yrs ago  . Alcohol use No     Allergies   Bee pollen; Tetanus toxoids; and Tramadol   Review of Systems Review of Systems  A complete review of systems was obtained and all systems are negative except as noted in the HPI and PMH.    Physical Exam Updated Vital Signs BP 118/68 (BP Location: Right Arm)   Pulse 68   Temp (!) 97.5 F (36.4 C) (Oral)   Resp 16   SpO2 96%   Physical Exam  Constitutional: He is oriented to person, place, and time. He appears well-developed and well-nourished. No distress.  HENT:  Head: Normocephalic and atraumatic.  Mouth/Throat: Oropharynx is clear and moist.  Eyes: Pupils are equal,  round, and reactive to light. Conjunctivae and EOM are normal.  Neck: Normal range of motion.  Cardiovascular: Normal rate, regular rhythm and intact distal pulses.   Pulmonary/Chest: Effort normal and breath sounds normal.  Abdominal: Soft. There is no tenderness.  Musculoskeletal: Normal range of motion.  Neurological: He is alert and oriented to person, place, and time.  Skin: He is not diaphoretic.  Psychiatric: He has a normal mood and affect.  Nursing note and vitals reviewed.    ED Treatments / Results  Labs (all labs ordered are listed, but only abnormal results are displayed) Labs Reviewed  CBC WITH DIFFERENTIAL/PLATELET - Abnormal; Notable for the following:       Result Value   Hemoglobin 12.9 (*)    RDW 15.7 (*)    All other components within normal limits  COMPREHENSIVE METABOLIC PANEL - Abnormal; Notable for the following:    Glucose, Bld 106 (*)    BUN 25 (*)    Total Protein 6.4 (*)    Albumin 3.4 (*)    Total Bilirubin 1.3 (*)    GFR calc non Af Amer 55 (*)    All other components within normal limits  URINALYSIS, ROUTINE W REFLEX MICROSCOPIC - Abnormal; Notable for the following:    APPearance HAZY (*)    All other components within normal limits  CBG  MONITORING, ED - Abnormal; Notable for the following:    Glucose-Capillary 112 (*)    All other components within normal limits    EKG  EKG Interpretation  Date/Time:  Monday January 30 2017 13:27:23 EDT Ventricular Rate:  62 PR Interval:  186 QRS Duration: 86 QT Interval:  398 QTC Calculation: 403 R Axis:   62 Text Interpretation:  Normal sinus rhythm Nonspecific T wave abnormality Abnormal ECG since last tracing no significant change Confirmed by Mancel BaleWentz, Elliott [id number]) on 01/30/2017 2:19:01 PM       Radiology Ct Head Wo Contrast  Result Date: 01/30/2017 CLINICAL DATA:  Confusion and dizziness. EXAM: CT HEAD WITHOUT CONTRAST TECHNIQUE: Contiguous axial images were obtained from the base of the skull through the vertex without intravenous contrast. COMPARISON:  Head CT 10/26/2016 and MRI brain 01/15/2017. FINDINGS: Brain: Stable age related cerebral atrophy, ventriculomegaly and periventricular white matter disease. No extra-axial fluid collections are identified. No CT findings for acute hemispheric infarction or intracranial hemorrhage. No mass lesions. The brainstem and cerebellum are normal. Vascular: No aneurysm. Remarkably little atherosclerotic vascular calcifications. No hyperdense vessels. Skull: No acute bony findings.  No worrisome bone lesions. Sinuses/Orbits: The visualized paranasal sinuses are clear. Left mastoid effusions are noted and appear stable since the prior examination. The middle ear cavities are clear. The globes are intact. Other: No scalp lesions. IMPRESSION: Stable age related cerebral atrophy, ventriculomegaly and periventricular white matter disease. No acute intracranial findings. Stable left mastoid effusions. Electronically Signed   By: Rudie MeyerP.  Gallerani M.D.   On: 01/30/2017 15:15    Procedures Procedures (including critical care time)  Medications Ordered in ED Medications  sodium chloride 0.9 % bolus 1,000 mL (0 mLs Intravenous Stopped 01/30/17 1739)      Initial Impression / Assessment and Plan / ED Course  I have reviewed the triage vital signs and the nursing notes.  Pertinent labs & imaging results that were available during my care of the patient were reviewed by me and considered in my medical decision making (see chart for details).     Vitals:   01/30/17 1326 01/30/17 1740  BP: 122/70  118/68  Pulse: 68   Resp: 18 16  Temp: 97.6 F (36.4 C) (!) 97.5 F (36.4 C)  TempSrc: Oral Oral  SpO2: 97% 96%    Medications  sodium chloride 0.9 % bolus 1,000 mL (0 mLs Intravenous Stopped 01/30/17 1739)    CARDARIUS SENAT is 83 y.o. male presenting with Lightheadedness, he is also having much more agitation, pacing, fidgety behavior over the last several days. He recently started Lexapro 2 weeks ago, he is weaning down off his Xanax. His wife feels adamantly that this is a response to the Lexapro. Neurologic exam nonfocal. Blood work, urinalysis, head CT and EKG reassuring. No indication for admission at this time. Patient is ambulatory and his family feels comfortable taking him home.  Wife would like to DC the Lexapro, noted is an SSRI to feel it appropriate to abruptly DC it. I've advised her that he can take half of his normal dose and will need to follow with Dr. Lorain Childes for further weaning.  Dr. Effie Shy has discussed the case with primary care physician Lorain Childes who will follow-up with the patient.   Evaluation does not show pathology that would require ongoing emergent intervention or inpatient treatment. Pt is hemodynamically stable and mentating appropriately. Discussed findings and plan with patient/guardian, who agrees with care plan. All questions answered. Return precautions discussed and outpatient follow up given.     Final Clinical Impressions(s) / ED Diagnoses   Final diagnoses:  Anxiety  Light headed    New Prescriptions Discharge Medication List as of 01/30/2017  6:10 PM       Kei Langhorst, Mardella Layman 01/30/17 2029    Mancel Bale, MD 02/01/17 1331

## 2017-01-30 NOTE — ED Notes (Addendum)
Gave pt a cup of water and a [place] sandwich, pt started to cough after drinking water.

## 2017-01-30 NOTE — ED Notes (Signed)
Patient transported to X-ray 

## 2017-02-01 ENCOUNTER — Telehealth: Payer: Self-pay | Admitting: Cardiology

## 2017-02-01 NOTE — Telephone Encounter (Signed)
New message    Pt wife is calling asking about pt blood work that he is due for, she has a question about labs. Please call.

## 2017-02-01 NOTE — Telephone Encounter (Signed)
SPOKE W/WIFE  FAX LAB ORDER TO PCP PER WIFE FAXED VIA [employer]

## 2017-02-03 ENCOUNTER — Other Ambulatory Visit: Payer: Self-pay | Admitting: Internal Medicine

## 2017-02-03 ENCOUNTER — Telehealth: Payer: Self-pay | Admitting: Cardiology

## 2017-02-03 ENCOUNTER — Telehealth: Payer: Self-pay | Admitting: *Deleted

## 2017-02-03 DIAGNOSIS — R627 Adult failure to thrive: Secondary | ICD-10-CM

## 2017-02-03 DIAGNOSIS — R1084 Generalized abdominal pain: Secondary | ICD-10-CM

## 2017-02-03 DIAGNOSIS — F688 Other specified disorders of adult personality and behavior: Secondary | ICD-10-CM

## 2017-02-03 DIAGNOSIS — R634 Abnormal weight loss: Secondary | ICD-10-CM

## 2017-02-03 NOTE — Telephone Encounter (Signed)
Mrs. Jason [name] called and is confused about the dosage of the aspirin for her husband.  She used to give the large aspirin, but, should he be decreased to one 81 mg tablet.  Please call.

## 2017-02-03 NOTE — Telephone Encounter (Signed)
Called placed to the patient's wife, per the DPR. She wanted to verify what dosage of aspirin her husband should be taking. She was informed that according to the notes and chart, he should be taking 81 mg aspirin daily. She verbalized her understanding.

## 2017-02-06 ENCOUNTER — Ambulatory Visit
Admission: RE | Admit: 2017-02-06 | Discharge: 2017-02-06 | Disposition: A | Discharge status: Home / Self Care | Payer: Medicare Other | Source: Ambulatory Visit | Attending: Internal Medicine | Admitting: Internal Medicine

## 2017-02-06 ENCOUNTER — Encounter: Payer: Self-pay | Admitting: Diagnostic Neuroimaging

## 2017-02-06 ENCOUNTER — Ambulatory Visit (INDEPENDENT_AMBULATORY_CARE_PROVIDER_SITE_OTHER): Payer: Medicare Other | Admitting: Diagnostic Neuroimaging

## 2017-02-06 VITALS — BP 110/67 | HR 74 | Ht 63.0 in | Wt 136.0 lb

## 2017-02-06 DIAGNOSIS — R634 Abnormal weight loss: Secondary | ICD-10-CM

## 2017-02-06 DIAGNOSIS — F688 Other specified disorders of adult personality and behavior: Secondary | ICD-10-CM

## 2017-02-06 DIAGNOSIS — F419 Anxiety disorder, unspecified: Secondary | ICD-10-CM | POA: Diagnosis not present

## 2017-02-06 DIAGNOSIS — R413 Other amnesia: Secondary | ICD-10-CM | POA: Diagnosis not present

## 2017-02-06 DIAGNOSIS — R627 Adult failure to thrive: Secondary | ICD-10-CM

## 2017-02-06 DIAGNOSIS — R1084 Generalized abdominal pain: Secondary | ICD-10-CM

## 2017-02-06 DIAGNOSIS — F3289 Other specified depressive episodes: Secondary | ICD-10-CM

## 2017-02-06 MED ORDER — IOPAMIDOL (ISOVUE-300) INJECTION 61%
100.0000 mL | Freq: Once | INTRAVENOUS | Status: AC | PRN
  Administered 2017-02-06: 100 mL via INTRAVENOUS

## 2017-02-06 NOTE — Patient Instructions (Addendum)

## 2017-02-06 NOTE — Progress Notes (Signed)
[facility]  PATIENT: Jason [name] DOB: Aug 26, 1929  REFERRING CLINICIAN: Quillian Quince HISTORY FROM: patient and wife; and chart review REASON FOR VISIT: new consult (urgent)   HISTORICAL  CHIEF COMPLAINT:  Chief Complaint  Patient presents with  . New Patient (Initial Visit)    Referral from Dr. Jacky Kindle , pt had triple bypass 4 months per wife, was on alot of pains meds became more confusion in [place], been at home since May 2018  was on lexapro and made it worse    HISTORY OF PRESENT ILLNESS:   83 year old left-handed male here for evaluation of anxiety, depression, behavior and personality changes since May 2018.  Patient underwent coronary artery bypass graft surgery in April 2018. Patient went to cardiac rehabilitation following surgery. Around this time patient developed severe anxiety, depression, obsessive thoughts, paranoia, restlessness. Wife notes that he would have physical restlessness and movement of his arms and legs. Patient has poor quality of sleep. Symptoms gradually worsened over time. Patient has had multiple evaluations in the emergency room and with PCP. He was started on Lexapro for 2 weeks but symptoms worsen. Over the past 1 week patient has now been switched to mirtazapine and alprazolam which seems to have helped anxiety, restlessness and insomnia symptoms.  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  Patient lives at home with his wife. Patient has some mild short-term memory problems, which have worsened in the last 4-6 weeks. Previously to his heart surgery, patient was very active, driving, shopping, operating independently outside the home, taking care of bills and finances. Since his heart surgery has had significant decline in his day-to-day activities. He no longer drives a car. The last few weeks his wife has been paying the bills.  Today  patient's main complaint is that of "dizziness". He feels a "dull feeling", "constant", "comes and goes", "swimming". He wants to "lay head down and go to sleep". He denies any severe vertigo, nausea or vomiting. He does have some constipation.   REVIEW OF SYSTEMS: Full 14 system review of systems performed and negative with exception of: Weight loss ringing in ears constipation feeling cold weakness dizziness difficulty swallowing insomnia restless legs depression anxiety skin sensitivity itching.   ALLERGIES: Allergies  Allergen Reactions  . Bee Pollen Anaphylaxis  . Tetanus Toxoids Swelling and Other (See Comments)    SWELLING REACTION UNSPECIFIED ? LOCAL REACTION ?  Marland Kitchen Tramadol Other (See Comments)    confusion    HOME MEDICATIONS: Outpatient Medications Prior to Visit  Medication Sig Dispense Refill  . aspirin 81 MG tablet Take 81 mg by mouth daily.    . rosuvastatin (CRESTOR) 20 MG tablet Take 1 tablet (20 mg total) by mouth daily. 90 tablet 3   No facility-administered medications prior to visit.     PAST MEDICAL HISTORY: Past Medical History:  Diagnosis Date  . Anxiety    takes Diazepam daily prn  . Arthritis   . Constipation   . Coronary artery disease    a. 09/2016 Ant STEMI/Cath: LM 60, LAD 87m/d (thrombotic), LCX nl, OM2 90/75, RCA nl, EF 35-45%;  b. 09/2016 CABG x 3 (LIMA->LAD, VG->OM2, VG->LPDA).  . Depression    takes Trazodone nightly  . Dysarthria   . Enlarged prostate   . GERD (gastroesophageal reflux disease)   . H/O hiatal hernia    takes Omeprazole daily  . Hemorrhoids   . History of colitis  many yrs ago  . History of colon polyps   . History of kidney stones    passed on his own  . Hyperlipidemia    was on medication but has been off for a while  . Insomnia    takes Trazodone nightly  . Ischemic dilated cardiomyopathy (HCC)    a. 09/2016 LV Gram: EF 35-45%;  b. 09/2016 Echo: EF 45-50%, mild conc LVH, no rwma, Gr1 DD, mod AI, triv MR, mild TR,  PASP .  . 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 Pneumonia 10/26/2016  . Protein calorie malnutrition (HCC) 10/2016  . Urinary frequency     PAST SURGICAL HISTORY: Past Surgical History:  Procedure Laterality Date  . bilateral cataract surgery    . CARDIAC SURGERY    . CIRCUMCISION    . COLONOSCOPY    . CORONARY ARTERY BYPASS GRAFT N/A 10/07/2016   Procedure: CORONARY ARTERY BYPASS GRAFTING (CABG) x 3 , using internal mammary, and right greater saphenous vein harvested endoscopically SVG-PD, SVG-OM, LIMA-LAD;  Surgeon: [name], MD;  Location: [facility] OR;  Service: Open Heart Surgery;  Laterality: N/A;  . DIRECT LARYNGOSCOPY N/A 06/26/2013   Procedure: DIRECT LARYNGOSCOPY;  Surgeon: Christia Reading, MD;  Location: [facility] OR;  Service: ENT;  Laterality: N/A;  . EAR CYST EXCISION N/A 04/05/2013   Procedure: CYST REMOVAL;  Surgeon: Melvenia Beam, MD;  Location: [facility] OR;  Service: ENT;  Laterality: N/A;  . ESOPHAGOGASTRODUODENOSCOPY    . ESOPHAGOGASTRODUODENOSCOPY N/A 07/15/2013   Procedure: ESOPHAGOGASTRODUODENOSCOPY (EGD);  Surgeon: Cherylynn Ridges, MD;  Location: [facility] ENDOSCOPY;  Service: General;  Laterality: N/A;  . HEMORRHOIDECTOMY WITH HEMORRHOID BANDING    . HERNIA REPAIR     double  . LEFT HEART CATH AND CORONARY ANGIOGRAPHY N/A 10/01/2016   Procedure: Left Heart Cath and Coronary Angiography;  Surgeon: Lyn Records, MD;  Location: [facility] INVASIVE CV LAB;  Service: Cardiovascular;  Laterality: N/A;  . PEG PLACEMENT N/A 07/15/2013   Procedure: PERCUTANEOUS ENDOSCOPIC GASTROSTOMY (PEG) PLACEMENT;  Surgeon: Cherylynn Ridges, MD;  Location: [facility] ENDOSCOPY;  Service: General;  Laterality: N/A;  . PERIPHERALLY INSERTED CENTRAL CATHETER INSERTION    . right knee arthroscopy    . SEPTOPLASTY N/A 04/05/2013   Procedure: SEPTOPLASTY;  Surgeon: Melvenia Beam, MD;  Location: [facility] OR;  Service: ENT;  Laterality: N/A;  . SINUS ENDO W/FUSION N/A 07/03/2013   Procedure: ENDOSCOPIC SINUS SURGERY WITH FUSION  NAVIGATION with By-Nasopharongeal Biopsy;  Surgeon: Christia Reading, MD;  Location: [facility] OR;  Service: ENT;  Laterality: N/A;  . SINUS EXPLORATION Bilateral 07/11/2013   Procedure:  BEDSIDE  SINUS EXPLORATION ;  Surgeon: Christia Reading, MD;  Location: [facility] OR;  Service: ENT;  Laterality: Bilateral;  . TEE WITHOUT CARDIOVERSION N/A 10/07/2016   Procedure: TRANSESOPHAGEAL ECHOCARDIOGRAM (TEE);  Surgeon: [name], MD;  Location: [facility] OR;  Service: Open Heart Surgery;  Laterality: N/A;  . TONSILLECTOMY      FAMILY HISTORY: Family History  Problem Relation Age of Onset  . CAD Father     SOCIAL HISTORY:  Social History   Social History  . Marital status: Married    Spouse name: N/A  . Number of children: N/A  . Years of education: N/A   Occupational History  . Not on file.   Social History Main Topics  . Smoking status: Former Smoker    Quit date: 07/01/1993  . Smokeless tobacco: Former Neurosurgeon    Types: Chew    Quit date: 12/29/2012  Comment: quit chewing tobacco several months ago and stopped smoking cigars yrs ago  . Alcohol use No  . Drug use: No  . Sexual activity: No   Other Topics Concern  . Not on file   Social History Narrative  . No narrative on file     PHYSICAL EXAM  GENERAL EXAM/CONSTITUTIONAL: Vitals:  Vitals:   02/06/17 0810  BP: 110/67  Pulse: 74  Weight: 136 lb (61.7 kg)  Height: 5\' 3"  (1.6 m)     Body mass index is 24.09 kg/m.  No exam data present  Patient is in no distress; well developed, nourished and groomed; neck is supple  CALM RELAXED, ELDERLY MALE  CARDIOVASCULAR:  Examination of carotid arteries is normal; no carotid bruits  DISTANT HEART SOUNDS; Regular rate and rhythm, no murmurs  Examination of peripheral vascular system by observation and palpation is normal  EYES:  Ophthalmoscopic exam of optic discs and posterior segments is normal; no papilledema or hemorrhages  MUSCULOSKELETAL:  Gait, strength, tone, movements  noted in Neurologic exam below  NEUROLOGIC: MENTAL STATUS:  MMSE - Mini Mental State Exam 02/06/2017  Orientation to time 4  Orientation to Place 4  Registration 3  Attention/ Calculation 1  Recall 1  Language- name 2 objects 2  Language- repeat 0  Language- follow 3 step command 3  Language- read & follow direction 1  Write a sentence 1  Copy design 1  Total score 21    SOFT SPOKEN  awake, alert, oriented to person, place and time  DECR RECALL; remote memory intact  DECR attention and concentration  language fluent, comprehension intact, naming intact,   fund of knowledge appropriate  CRANIAL NERVE:   2nd - no papilledema on fundoscopic exam  2nd, 3rd, 4th, 6th - SMALL PINPOINT PUPILS; NO REACTION; visual fields full to confrontation, extraocular muscles intact, no nystagmus  5th - facial sensation symmetric  7th - facial strength symmetric  8th - DECR HEARING  9th - palate elevates symmetrically, uvula midline  11th - shoulder shrug symmetric  12th - tongue protrusion midline  MOTOR:   normal bulk and tone, full strength in the BUE, BLE  SENSORY:   normal and symmetric to light touch, temperature, vibration  COORDINATION:   finger-nose-finger, fine finger movements normal  REFLEXES:   deep tendon reflexes TRACE and symmetric  NEGATIVE MYERSONS  NEGATIVE SNOUT  GAIT/STATION:   USING WALKER; STOOPED POSTURE; SLOW MOVEMENTS; SMALL STEPS    DIAGNOSTIC DATA (LABS, IMAGING, TESTING) - I reviewed patient records, labs, notes, testing and imaging myself where available.  Lab Results  Component Value Date   WBC 7.4 01/30/2017   HGB 12.9 (L) 01/30/2017   HCT 39.5 01/30/2017   MCV 84.9 01/30/2017   PLT 179 01/30/2017      Component Value Date/Time   NA 138 01/30/2017 1420   NA 140 12/16/2016 1357   K 3.9 01/30/2017 1420   CL 108 01/30/2017 1420   CO2 23 01/30/2017 1420   GLUCOSE 106 (H) 01/30/2017 1420   BUN 25 (H) 01/30/2017 1420    BUN 18 12/16/2016 1357   CREATININE 1.18 01/30/2017 1420   CREATININE 1.37 (H) 09/17/2013 1217   CALCIUM 9.0 01/30/2017 1420   PROT 6.4 (L) 01/30/2017 1420   ALBUMIN 3.4 (L) 01/30/2017 1420   AST 24 01/30/2017 1420   ALT 27 01/30/2017 1420   ALKPHOS 74 01/30/2017 1420   BILITOT 1.3 (H) 01/30/2017 1420   GFRNONAA 55 (L) 01/30/2017 1420  GFRNONAA 48 (L) 09/17/2013 1217   GFRAA >60 01/30/2017 1420   GFRAA 56 (L) 09/17/2013 1217   Lab Results  Component Value Date   CHOL 127 12/02/2016   HDL 45 12/02/2016   LDLCALC 60 12/02/2016   TRIG 108 12/02/2016   CHOLHDL 2.8 12/02/2016   Lab Results  Component Value Date   HGBA1C 5.3 10/01/2016   Lab Results  Component Value Date   VITAMINB12 769 10/26/2016   Lab Results  Component Value Date   TSH 4.260 12/16/2016    01/15/17 MRI brain [I reviewed images myself and agree with interpretation, except there is severe right perisylvian and mild left perisylvian atrophy. -VRP]  - Normal appearance of the brain for a person of this age. Mild volume loss and minimal small vessel change. No acute or reversible finding.   - No evidence of active skullbase inflammatory disease. Some scarring at the left skull base related to the previous episode. Presently, the veins at the left skullbase are patent.  - Mastoid effusion on the left without enhancement. This is not extensive. This could possibly relate to dizziness, but suspicion is not high.     ASSESSMENT AND PLAN  83 y.o. year old male here with significant anxiety, depression, following cardiac surgery in April 2018, with mild memory loss, MMSE 21 out of 30, family history of dementia (brother) and ALS (father). MRI brain shows significant right perisylvian atrophy. In summary findings could be related to underlying neurodegenerative process vs adjustment / mood disorder.   Fortunately symptoms are improving with medication management of anxiety. Will monitor symptoms and see if patient  has stabilization or progression of symptoms. If symptoms continue to progress, this would favor a neurodegenerative process. If symptoms stabilize and improve, this would fit more with adjustment disorder, anxiety, depression.  Ddx: anxiety / depression (due to medical condition) vs neurodegenerative dementia (FTD vs AD)  1. Anxiety   2. Other depression   3. Memory loss      PLAN:  I spent 60 minutes of face to face time with patient. Greater than 50% of time was spent in counseling and coordination of care with patient. In summary we discussed:  - continue supportive care - continue miratazapine + alprazolam prn - if anxiety/depression significantly worsen, then agree with psychiatry / psychology consult - monitor for dementia symptom progression (reviewed with patient and wife) - increase safety / supervision  Return in about 2 months (around 04/08/2017).    Suanne MarkerVIKRAM R. PENUMALLI, MD 02/06/2017, 8:31 AM Certified in Neurology, Neurophysiology and Neuroimaging  [facility] [address], [address] [place], [place] [address] [phone]

## 2017-02-07 ENCOUNTER — Encounter: Payer: Self-pay | Admitting: Neurology

## 2017-03-08 ENCOUNTER — Telehealth (HOSPITAL_COMMUNITY): Payer: Self-pay

## 2017-03-08 NOTE — Telephone Encounter (Signed)
I tried to call patient to discuss if patient was still interested in participating in cardiac rehab. Patient phone just rang and and no voicemail to leave a message.

## 2017-03-08 NOTE — Telephone Encounter (Signed)
I called to discuss scheduling for cardiac rehab. Patient wife answered and declined cardiac rehab. Patient is not able to participate and they are both unable to drive. Referral closed.

## 2017-04-03 ENCOUNTER — Encounter: Payer: Self-pay | Admitting: Cardiology

## 2017-04-03 ENCOUNTER — Telehealth: Payer: Self-pay | Admitting: *Deleted

## 2017-04-03 NOTE — Telephone Encounter (Signed)
NOTES FAXED TO [facility] °

## 2017-04-03 NOTE — Progress Notes (Signed)
HPI: Follow-up coronary artery disease. Patient was admitted April 2018 with anterior ST elevation myocardial infarction. Cardiac catheterization revealed a 50% left main, 90% mid LAD with thrombus, 90% ostial obtuse marginal and ejection fraction 35-45%. Preoperative carotid Dopplers showed no carotid disease. Echocardiogram showed ejection fraction 45-50%, moderate aortic insufficiency and mild tricuspid regurgitation. Patient underwent coronary artery bypass graft with a LIMA to the LAD, saphenous vein graft to the second marginal and saphenous vein graft to the PDA. Since last seen patient denies dyspnea, chest pain, palpitations or syncope. He is having difficulties with depression.  Current Outpatient Prescriptions  Medication Sig Dispense Refill  . ALPRAZolam (XANAX) 0.25 MG tablet 0.25 mg.     . aspirin 81 MG tablet Take 81 mg by mouth daily.    . mirtazapine (REMERON) 15 MG tablet     . rosuvastatin (CRESTOR) 20 MG tablet Take 1 tablet (20 mg total) by mouth daily. 90 tablet 3   No current facility-administered medications for this visit.      Past Medical History:  Diagnosis Date  . Anxiety    takes Diazepam daily prn  . Arthritis   . Constipation   . Coronary artery disease    a. 09/2016 Ant STEMI/Cath: LM 60, LAD 66m/d (thrombotic), LCX nl, OM2 90/75, RCA nl, EF 35-45%;  b. 09/2016 CABG x 3 (LIMA->LAD, VG->OM2, VG->LPDA).  . Depression    takes Trazodone nightly  . Dysarthria   . Enlarged prostate   . GERD (gastroesophageal reflux disease)   . H/O hiatal hernia    takes Omeprazole daily  . Hemorrhoids   . History of colitis    many yrs ago  . History of colon polyps   . History of kidney stones    passed on his own  . Hyperlipidemia    was on medication but has been off for a while  . Insomnia    takes Trazodone nightly  . Ischemic dilated cardiomyopathy (HCC)    a. 09/2016 LV Gram: EF 35-45%;  b. 09/2016 Echo: EF 45-50%, mild conc LVH, no rwma, Gr1 DD, mod  AI, triv MR, mild TR, PASP .  . 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 Pneumonia 10/26/2016  . Protein calorie malnutrition (HCC) 10/2016  . Urinary frequency     Past Surgical History:  Procedure Laterality Date  . bilateral cataract surgery    . CARDIAC SURGERY    . CIRCUMCISION    . COLONOSCOPY    . CORONARY ARTERY BYPASS GRAFT N/A 10/07/2016   Procedure: CORONARY ARTERY BYPASS GRAFTING (CABG) x 3 , using internal mammary, and right greater saphenous vein harvested endoscopically SVG-PD, SVG-OM, LIMA-LAD;  Surgeon: [name], MD;  Location: [facility] OR;  Service: Open Heart Surgery;  Laterality: N/A;  . DIRECT LARYNGOSCOPY N/A 06/26/2013   Procedure: DIRECT LARYNGOSCOPY;  Surgeon: Christia Reading, MD;  Location: [facility] OR;  Service: ENT;  Laterality: N/A;  . EAR CYST EXCISION N/A 04/05/2013   Procedure: CYST REMOVAL;  Surgeon: Melvenia Beam, MD;  Location: [facility] OR;  Service: ENT;  Laterality: N/A;  . ESOPHAGOGASTRODUODENOSCOPY    . ESOPHAGOGASTRODUODENOSCOPY N/A 07/15/2013   Procedure: ESOPHAGOGASTRODUODENOSCOPY (EGD);  Surgeon: Cherylynn Ridges, MD;  Location: [facility] ENDOSCOPY;  Service: General;  Laterality: N/A;  . HEMORRHOIDECTOMY WITH HEMORRHOID BANDING    . HERNIA REPAIR     double  . LEFT HEART CATH AND CORONARY ANGIOGRAPHY N/A 10/01/2016   Procedure: Left Heart Cath and Coronary Angiography;  Surgeon: Sherilyn Cooter  Malissa Hippo, MD;  Location: [facility] INVASIVE CV LAB;  Service: Cardiovascular;  Laterality: N/A;  . PEG PLACEMENT N/A 07/15/2013   Procedure: PERCUTANEOUS ENDOSCOPIC GASTROSTOMY (PEG) PLACEMENT;  Surgeon: Cherylynn Ridges, MD;  Location: [facility] ENDOSCOPY;  Service: General;  Laterality: N/A;  . PERIPHERALLY INSERTED CENTRAL CATHETER INSERTION    . right knee arthroscopy    . SEPTOPLASTY N/A 04/05/2013   Procedure: SEPTOPLASTY;  Surgeon: Melvenia Beam, MD;  Location: [facility] OR;  Service: ENT;  Laterality: N/A;  . SINUS ENDO W/FUSION N/A 07/03/2013   Procedure: ENDOSCOPIC SINUS SURGERY WITH FUSION  NAVIGATION with By-Nasopharongeal Biopsy;  Surgeon: Christia Reading, MD;  Location: [facility] OR;  Service: ENT;  Laterality: N/A;  . SINUS EXPLORATION Bilateral 07/11/2013   Procedure:  BEDSIDE  SINUS EXPLORATION ;  Surgeon: Christia Reading, MD;  Location: [facility] OR;  Service: ENT;  Laterality: Bilateral;  . TEE WITHOUT CARDIOVERSION N/A 10/07/2016   Procedure: TRANSESOPHAGEAL ECHOCARDIOGRAM (TEE);  Surgeon: [name], MD;  Location: [facility] OR;  Service: Open Heart Surgery;  Laterality: N/A;  . TONSILLECTOMY      Social History   Social History  . Marital status: Married    Spouse name: N/A  . Number of children: N/A  . Years of education: N/A   Occupational History  . Not on file.   Social History Main Topics  . Smoking status: Former Smoker    Quit date: 07/01/1993  . Smokeless tobacco: Former Neurosurgeon    Types: Chew    Quit date: 12/29/2012     Comment: quit chewing tobacco several months ago and stopped smoking cigars yrs ago  . Alcohol use No  . Drug use: No  . Sexual activity: No   Other Topics Concern  . Not on file   Social History Narrative  . No narrative on file    Family History  Problem Relation Age of Onset  . CAD Father     ROS: Depression but no fevers or chills, productive cough, hemoptysis, dysphasia, odynophagia, melena, hematochezia, dysuria, hematuria, rash, seizure activity, orthopnea, PND, pedal edema, claudication. Remaining systems are negative.  Physical Exam: Well-developed well-nourished in no acute distress.  Skin is warm and dry.  HEENT is normal.  Neck is supple.  Chest is clear to auscultation with normal expansion.  Cardiovascular exam is regular rate and rhythm.  Abdominal exam nontender or distended. No masses palpated. Extremities show no edema. neuro grossly intact   A/P  1 Coronary artery disease status post coronary artery bypassing graft-continue aspirin and statin.  2 hyperlipidemia-continue statin.  3 ischemic cardiomyopathy-I will  resume low-dose beta blocker. Begin Toprol 12.5 mg daily.  4 moderate aortic insufficiency-we will need follow-up echo is in the future.  Olga Millers, MD

## 2017-04-04 NOTE — Addendum Note (Signed)
Addendum  created 04/04/17 1848 by Jairo Ben, MD   Sign clinical note

## 2017-04-05 LAB — LIPID PANEL
Chol/HDL Ratio: 2.6 ratio (ref 0.0–5.0)
Cholesterol, Total: 118 mg/dL (ref 100–199)
HDL: 45 mg/dL (ref >39)
LDL Calculated: 48 mg/dL (ref 0–99)
Triglycerides: 125 mg/dL (ref 0–149)
VLDL Cholesterol Cal: 25 mg/dL (ref 5–40)

## 2017-04-05 LAB — HEPATIC FUNCTION PANEL
ALBUMIN: 4.1 g/dL (ref 3.5–4.7)
ALK PHOS: 86 IU/L (ref 39–117)
ALT: 24 IU/L (ref 0–44)
AST: 26 IU/L (ref 0–40)
BILIRUBIN TOTAL: 1.8 mg/dL — AB (ref 0.0–1.2)
BILIRUBIN, DIRECT: 0.42 mg/dL — AB (ref 0.00–0.40)
TOTAL PROTEIN: 6.9 g/dL (ref 6.0–8.5)

## 2017-04-06 ENCOUNTER — Telehealth: Payer: Self-pay | Admitting: Cardiology

## 2017-04-06 NOTE — Telephone Encounter (Signed)
Received records from [facility] for appointment on 04/13/17 with Dr Jens Som.  Records put with Dr Ludwig Clarks schedule for 04/13/17. lp

## 2017-04-10 ENCOUNTER — Ambulatory Visit (INDEPENDENT_AMBULATORY_CARE_PROVIDER_SITE_OTHER): Payer: Medicare Other | Admitting: Diagnostic Neuroimaging

## 2017-04-10 ENCOUNTER — Encounter: Payer: Self-pay | Admitting: Diagnostic Neuroimaging

## 2017-04-10 VITALS — BP 114/58 | HR 69 | Wt 134.4 lb

## 2017-04-10 DIAGNOSIS — R413 Other amnesia: Secondary | ICD-10-CM

## 2017-04-10 DIAGNOSIS — F419 Anxiety disorder, unspecified: Secondary | ICD-10-CM

## 2017-04-10 NOTE — Progress Notes (Signed)
[facility]  PATIENT: Jason [name] DOB: [birth year]  REFERRING CLINICIAN: Quillian Quince HISTORY FROM: patient and wife; and chart review REASON FOR VISIT: follow up    HISTORICAL  CHIEF COMPLAINT:  Chief Complaint  Patient presents with  . Memory Loss    rm 7, wife- Myriam Jacobson, dgtr- Monica  MMSE 22  . Follow-up    2 month    HISTORY OF PRESENT ILLNESS:   UPDATE (04/10/17, VRP): Since last visit, doing well. Tolerating seroquel for anxiety. Off of mirtazapine and xanax. No alleviating or aggravating factors. Memory loss stable.   PRIOR HPI (02/06/17): 83 year old left-handed male here for evaluation of anxiety, depression, behavior and personality changes since May 2018. Patient underwent coronary artery bypass graft surgery in April 2018. Patient went to cardiac rehabilitation following surgery. Around this time patient developed severe anxiety, depression, obsessive thoughts, paranoia, restlessness. Wife notes that he would have physical restlessness and movement of his arms and legs. Patient has poor quality of sleep. Symptoms gradually worsened over time. Patient has had multiple evaluations in the emergency room and with PCP. He was started on Lexapro for 2 weeks but symptoms worsen. Over the past 1 week patient has now been switched to mirtazapine and alprazolam which seems to have helped anxiety, restlessness and insomnia symptoms. 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 Patient lives at home with his wife. Patient has some mild short-term memory problems, which have worsened in the last 4-6 weeks. Previously to his heart surgery, patient was very active, driving, shopping, operating independently outside the home, taking care of bills and finances. Since his heart surgery has had significant decline in his day-to-day activities. He no longer drives a car. The last few weeks his wife  has been paying the bills. Today patient's main complaint is that of "dizziness". He feels a "dull feeling", "constant", "comes and goes", "swimming". He wants to "lay head down and go to sleep". He denies any severe vertigo, nausea or vomiting. He does have some constipation.   REVIEW OF SYSTEMS: Full 14 system review of systems performed and negative with exception of: agitation confusion depression anxiety dizziness numbness weakness heating loss eye itching.    ALLERGIES: Allergies  Allergen Reactions  . Bee Pollen Anaphylaxis  . Seroquel [Quetiapine Fumarate] Other (See Comments)    Hallucinations, obsessing  . Tetanus Toxoids Swelling and Other (See Comments)    SWELLING REACTION UNSPECIFIED ? LOCAL REACTION ?  Marland Kitchen Tramadol Other (See Comments)    confusion    HOME MEDICATIONS: Outpatient Medications Prior to Visit  Medication Sig Dispense Refill  . aspirin 81 MG tablet Take 81 mg by mouth daily.    . mirtazapine (REMERON) 15 MG tablet     . rosuvastatin (CRESTOR) 20 MG tablet Take 1 tablet (20 mg total) by mouth daily. 90 tablet 3  . ALPRAZolam (XANAX) 0.25 MG tablet 0.25 mg.      No facility-administered medications prior to visit.     PAST MEDICAL HISTORY: Past Medical History:  Diagnosis Date  . Anxiety    takes Diazepam daily prn  . Arthritis   . Constipation   . Coronary artery disease    a. 09/2016 Ant STEMI/Cath: LM 60, LAD 53m/d (thrombotic), LCX nl, OM2 90/75, RCA nl, EF 35-45%;  b. 09/2016 CABG x 3 (LIMA->LAD, VG->OM2, VG->LPDA).  . Depression    takes Trazodone nightly  . Dysarthria   .  Enlarged prostate   . GERD (gastroesophageal reflux disease)   . H/O hiatal hernia    takes Omeprazole daily  . Hemorrhoids   . History of colitis    many yrs ago  . History of colon polyps   . History of kidney stones    passed on his own  . Hyperlipidemia    was on medication but has been off for a while  . Insomnia    takes Trazodone nightly  . Ischemic dilated  cardiomyopathy (HCC)    a. 09/2016 LV Gram: EF 35-45%;  b. 09/2016 Echo: EF 45-50%, mild conc LVH, no rwma, Gr1 DD, mod AI, triv MR, mild TR, PASP .  . Memory loss   . 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 Pneumonia 10/26/2016  . Protein calorie malnutrition (HCC) 10/2016  . Urinary frequency     PAST SURGICAL HISTORY: Past Surgical History:  Procedure Laterality Date  . bilateral cataract surgery    . CARDIAC SURGERY    . CIRCUMCISION    . COLONOSCOPY    . CORONARY ARTERY BYPASS GRAFT N/A 10/07/2016   Procedure: CORONARY ARTERY BYPASS GRAFTING (CABG) x 3 , using internal mammary, and right greater saphenous vein harvested endoscopically SVG-PD, SVG-OM, LIMA-LAD;  Surgeon: [name], MD;  Location: [facility] OR;  Service: Open Heart Surgery;  Laterality: N/A;  . DIRECT LARYNGOSCOPY N/A 06/26/2013   Procedure: DIRECT LARYNGOSCOPY;  Surgeon: Christia Reading, MD;  Location: [facility] OR;  Service: ENT;  Laterality: N/A;  . EAR CYST EXCISION N/A 04/05/2013   Procedure: CYST REMOVAL;  Surgeon: Melvenia Beam, MD;  Location[facility] OR;  Service: ENT;  Laterality: N/A;  . ESOPHAGOGASTRODUODENOSCOPY    . ESOPHAGOGASTRODUODENOSCOPY N/A 07/15/2013   Procedure: ESOPHAGOGASTRODUODENOSCOPY (EGD);  Surgeon: Cherylynn Ridges, MD;  Location: [facility] ENDOSCOPY;  Service: General;  Laterality: N/A;  . HEMORRHOIDECTOMY WITH HEMORRHOID BANDING    . HERNIA REPAIR     double  . LEFT HEART CATH AND CORONARY ANGIOGRAPHY N/A 10/01/2016   Procedure: Left Heart Cath and Coronary Angiography;  Surgeon: Lyn Records, MD;  Location: [facility] INVASIVE CV LAB;  Service: Cardiovascular;  Laterality: N/A;  . PEG PLACEMENT N/A 07/15/2013   Procedure: PERCUTANEOUS ENDOSCOPIC GASTROSTOMY (PEG) PLACEMENT;  Surgeon: Cherylynn Ridges, MD;  Location: [facility] ENDOSCOPY;  Service: General;  Laterality: N/A;  . PERIPHERALLY INSERTED CENTRAL CATHETER INSERTION    . right knee arthroscopy    . SEPTOPLASTY N/A 04/05/2013   Procedure: SEPTOPLASTY;  Surgeon:  Melvenia Beam, MD;  Location: [facility] OR;  Service: ENT;  Laterality: N/A;  . SINUS ENDO W/FUSION N/A 07/03/2013   Procedure: ENDOSCOPIC SINUS SURGERY WITH FUSION NAVIGATION with By-Nasopharongeal Biopsy;  Surgeon: Christia Reading, MD;  Location: [facility] OR;  Service: ENT;  Laterality: N/A;  . SINUS EXPLORATION Bilateral 07/11/2013   Procedure:  BEDSIDE  SINUS EXPLORATION ;  Surgeon: Christia Reading, MD;  Location: [facility] OR;  Service: ENT;  Laterality: Bilateral;  . TEE WITHOUT CARDIOVERSION N/A 10/07/2016   Procedure: TRANSESOPHAGEAL ECHOCARDIOGRAM (TEE);  Surgeon: [name], MD;  Location: [facility] OR;  Service: Open Heart Surgery;  Laterality: N/A;  . TONSILLECTOMY      FAMILY HISTORY: Family History  Problem Relation Age of Onset  . CAD Father     SOCIAL HISTORY:  Social History   Social History  . Marital status: Married    Spouse name: Myriam Jacobson  . Number of children: N/A  . Years of education: 11   Occupational History  .  Not on file.   Social History Main Topics  . Smoking status: Former Smoker    Quit date: 07/01/1993  . Smokeless tobacco: Former Neurosurgeon    Types: Chew    Quit date: 12/29/2012     Comment: quit chewing tobacco several months ago and stopped smoking cigars yrs ago  . Alcohol use No  . Drug use: No  . Sexual activity: No   Other Topics Concern  . Not on file   Social History Narrative   Lives with wife     PHYSICAL EXAM  GENERAL EXAM/CONSTITUTIONAL: Vitals:  Vitals:   04/10/17 1245  BP: (!) 114/58  Pulse: 69  Weight: 134 lb 6.4 oz (61 kg)   Body mass index is 23.81 kg/m. No exam data present  Patient is in no distress; well developed, nourished and groomed; neck is supple  CALM RELAXED, ELDERLY MALE  CARDIOVASCULAR:  Examination of carotid arteries is normal; no carotid bruits  DISTANT HEART SOUNDS; Regular rate and rhythm, no murmurs  Examination of peripheral vascular system by observation and palpation is normal  EYES:  Ophthalmoscopic exam of  optic discs and posterior segments is normal; no papilledema or hemorrhages  MUSCULOSKELETAL:  Gait, strength, tone, movements noted in Neurologic exam below  NEUROLOGIC: MENTAL STATUS:  MMSE - Mini Mental State Exam 04/10/2017 02/06/2017  Orientation to time 4 4  Orientation to Place 4 4  Registration 3 3  Attention/ Calculation 1 1  Recall 2 1  Language- name 2 objects 2 2  Language- repeat 0 0  Language- follow 3 step command 3 3  Language- read & follow direction 1 1  Write a sentence 1 1  Copy design 1 1  Total score 22 21    SOFT SPOKEN  awake, alert, oriented to person, place and time  DECR RECALL; remote memory intact  DECR attention and concentration  language fluent, comprehension intact, naming intact,   fund of knowledge appropriate  CRANIAL NERVE:   2nd - no papilledema on fundoscopic exam  2nd, 3rd, 4th, 6th - SMALL PINPOINT PUPILS; NO REACTION; visual fields full to confrontation, extraocular muscles intact, no nystagmus  5th - facial sensation symmetric  7th - facial strength symmetric  8th - DECR HEARING  9th - palate elevates symmetrically, uvula midline  11th - shoulder shrug symmetric  12th - tongue protrusion --> SLIGHTLY TO LEFT  MOTOR:   normal bulk and tone, full strength in the BUE, BLE; SLIGHTLY LIMITED IN LEFT DELTOID DUE TO PAIN  SENSORY:   normal and symmetric to light touch, temperature, vibration  COORDINATION:   finger-nose-finger, fine finger movements normal  REFLEXES:   deep tendon reflexes TRACE and symmetric  NEGATIVE MYERSONS  NEGATIVE SNOUT  GAIT/STATION:   USING WALKER; STOOPED POSTURE; SLOW MOVEMENTS; SMALL STEPS    DIAGNOSTIC DATA (LABS, IMAGING, TESTING) - I reviewed patient records, labs, notes, testing and imaging myself where available.  Lab Results  Component Value Date   WBC 7.4 01/30/2017   HGB 12.9 (L) 01/30/2017   HCT 39.5 01/30/2017   MCV 84.9 01/30/2017   PLT 179 01/30/2017       Component Value Date/Time   NA 138 01/30/2017 1420   NA 140 12/16/2016 1357   K 3.9 01/30/2017 1420   CL 108 01/30/2017 1420   CO2 23 01/30/2017 1420   GLUCOSE 106 (H) 01/30/2017 1420   BUN 25 (H) 01/30/2017 1420   BUN 18 12/16/2016 1357   CREATININE 1.18 01/30/2017 1420  CREATININE 1.37 (H) 09/17/2013 1217   CALCIUM 9.0 01/30/2017 1420   PROT 6.9 04/05/2017 1127   ALBUMIN 4.1 04/05/2017 1127   AST 26 04/05/2017 1127   ALT 24 04/05/2017 1127   ALKPHOS 86 04/05/2017 1127   BILITOT 1.8 (H) 04/05/2017 1127   GFRNONAA 55 (L) 01/30/2017 1420   GFRNONAA 48 (L) 09/17/2013 1217   GFRAA >60 01/30/2017 1420   GFRAA 56 (L) 09/17/2013 1217   Lab Results  Component Value Date   CHOL 118 04/05/2017   HDL 45 04/05/2017   LDLCALC 48 04/05/2017   TRIG 125 04/05/2017   CHOLHDL 2.6 04/05/2017   Lab Results  Component Value Date   HGBA1C 5.3 10/01/2016   Lab Results  Component Value Date   VITAMINB12 769 10/26/2016   Lab Results  Component Value Date   TSH 4.260 12/16/2016    08/08/13 MRI brain 1. 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 The soft tissue mass invades the clivus posteriorly. Trace left mastoid fluid is present, improved relative to prior exam. 2. No acute intracranial infarct or other abnormality identified. 3. Mild cerebral atrophy, unchanged.  01/15/17 MRI brain [I reviewed images myself and agree with interpretation, except there is severe right perisylvian and mild left perisylvian atrophy. -VRP]  - Normal appearance of the brain for a person of this age. Mild volume loss and minimal small vessel change. No acute or reversible finding.   - No evidence of active skullbase inflammatory disease. Some scarring at the left skull base related to the previous episode. Presently, the veins at the left skullbase are patent.   - Mastoid effusion on the left without enhancement. This is not extensive. This could possibly relate to dizziness, but suspicion is not high.  01/30/17 CT head  - Stable age related cerebral atrophy, ventriculomegaly and periventricular white matter disease. - No acute intracranial findings. - Stable left mastoid effusions.     ASSESSMENT AND PLAN  83 y.o. year old male here with significant anxiety, depression, following cardiac surgery in April 2018, with mild memory loss, MMSE 21 out of 30, family history of dementia (brother) and ALS (father). MRI brain shows significant right perisylvian atrophy. In summary findings could be related to underlying neurodegenerative process vs adjustment / mood disorder.   Fortunately symptoms are improving with medication management of anxiety. Will monitor symptoms and see if patient has stabilization or progression of symptoms. If symptoms continue to progress, this would favor a neurodegenerative process. If symptoms stabilize and improve, this would fit more with adjustment disorder, anxiety, depression.  Ddx: anxiety / depression (due to medical condition) vs neurodegenerative dementia (FTD vs AD)  1. Anxiety   2. Memory loss      PLAN:  - continue supportive care - continue seroquel  - monitor for dementia symptom progression (reviewed with patient, wife and daughter) - increase safety / supervision  Return if symptoms worsen or fail to improve, for return to PCP.    Suanne Marker, MD 04/10/2017, 1:04 PM Certified in Neurology, Neurophysiology and Neuroimaging  [facility] [address] [place], [place] [address] [phone]

## 2017-04-10 NOTE — Patient Instructions (Signed)
-   continue supportive care  - continue seroquel   - monitor for dementia symptom progression  - increase safety / supervision

## 2017-04-13 ENCOUNTER — Encounter: Payer: Self-pay | Admitting: Cardiology

## 2017-04-13 ENCOUNTER — Ambulatory Visit (INDEPENDENT_AMBULATORY_CARE_PROVIDER_SITE_OTHER): Payer: Medicare Other | Admitting: Cardiology

## 2017-04-13 VITALS — BP 130/58 | HR 64 | Ht 63.0 in | Wt 134.0 lb

## 2017-04-13 DIAGNOSIS — I2510 Atherosclerotic heart disease of native coronary artery without angina pectoris: Secondary | ICD-10-CM

## 2017-04-13 DIAGNOSIS — I255 Ischemic cardiomyopathy: Secondary | ICD-10-CM

## 2017-04-13 DIAGNOSIS — E785 Hyperlipidemia, unspecified: Secondary | ICD-10-CM | POA: Diagnosis not present

## 2017-04-13 MED ORDER — METOPROLOL SUCCINATE ER 25 MG PO TB24: 12.5000 mg | ORAL_TABLET | Freq: Every day | ORAL | 3 refills | Status: DC

## 2017-04-13 NOTE — Patient Instructions (Signed)
Medication Instructions:   START METOPROLOL SUCC ER 12.5 MG ONCE DAILY AT BEDTIME= 1/2 OF THE 25 MG TABLET ONCE DAILY  Follow-Up:  Your physician wants you to follow-up in: 6 MONTHS WITH DR Jens SomRENSHAW You will receive a reminder letter in the mail two months in advance. If you don't receive a letter, please call our office to schedule the follow-up appointment.   If you need a refill on your cardiac medications before your next appointment, please call your pharmacy.

## 2017-04-19 ENCOUNTER — Ambulatory Visit: Payer: Medicare Other | Admitting: Neurology

## 2017-11-09 IMAGING — CT CT HEAD W/O CM
3 series · 14 of 47 positions shown, 16 images · non-contrast
Comparison: Brain MRI, 08/08/2013.  Head CT, 06/24/2013.

CLINICAL DATA: Change in mental status.CABG [DATE] states patient had
been very "fidgety for several days and unresponsive today. Patient
would open his eye with stimulation

EXAM:
CT HEAD WITHOUT CONTRAST
TECHNIQUE: Contiguous axial images were obtained from the base of the skull
through the vertex without intravenous contrast.

[Series 2: head 5.0 h30s · axial · 0.41mm/px · z∈[-157,-32]mm · 8 of 30 slices shown, 10 images]
[im 3/30  brain]
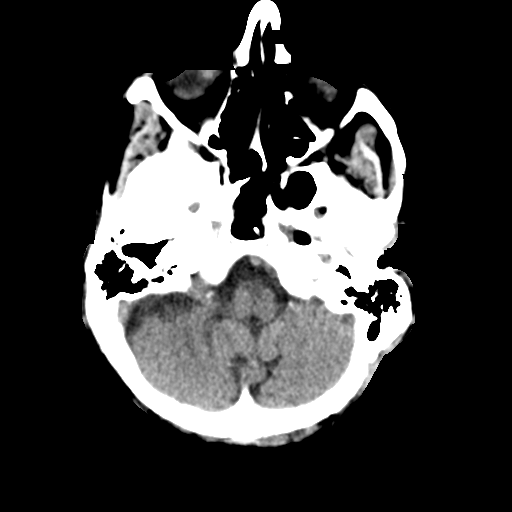
[im 3/30  bone]
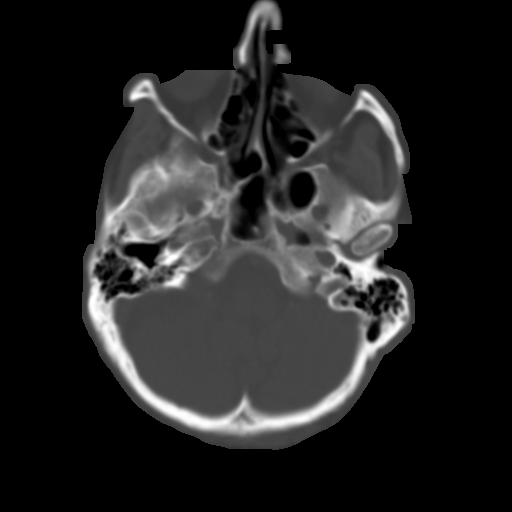
[im 7/30  brain]
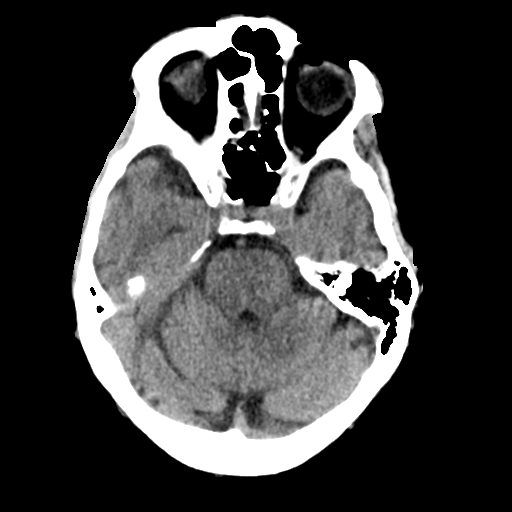
[im 10/30  brain]
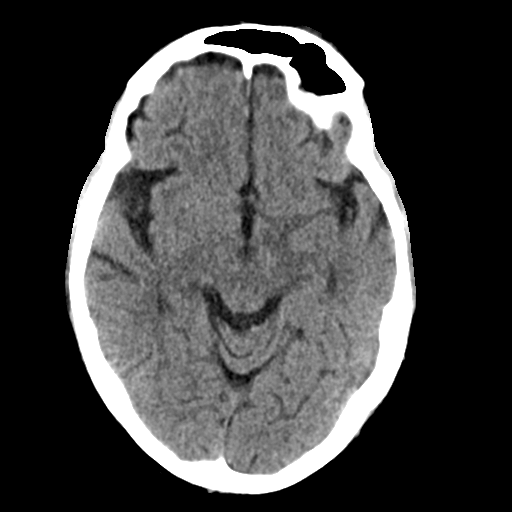
[im 14/30  brain]
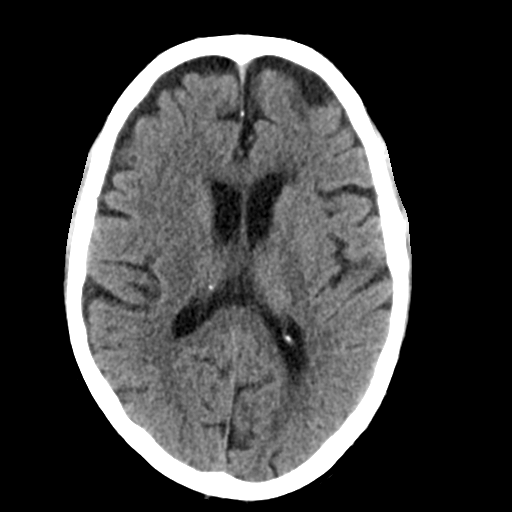
[im 17/30  brain]
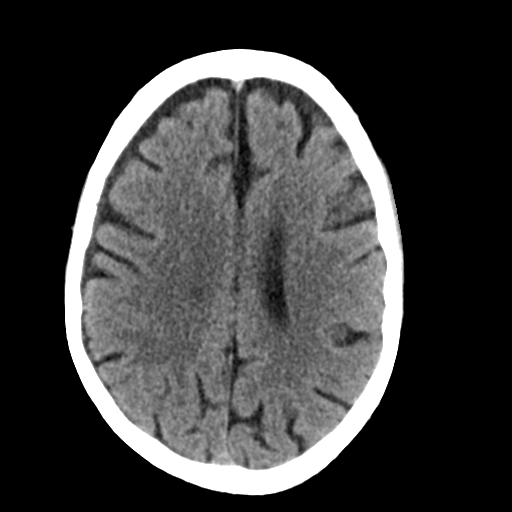
[im 17/30  bone]
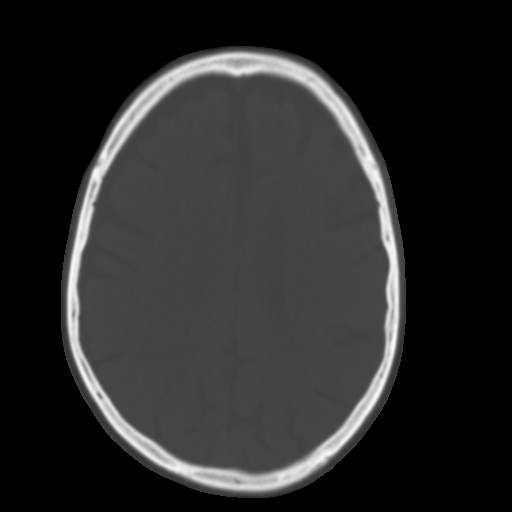
[im 21/30  brain]
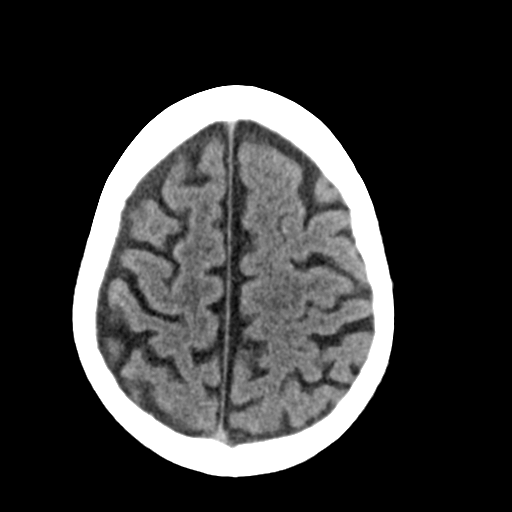
[im 24/30  brain]
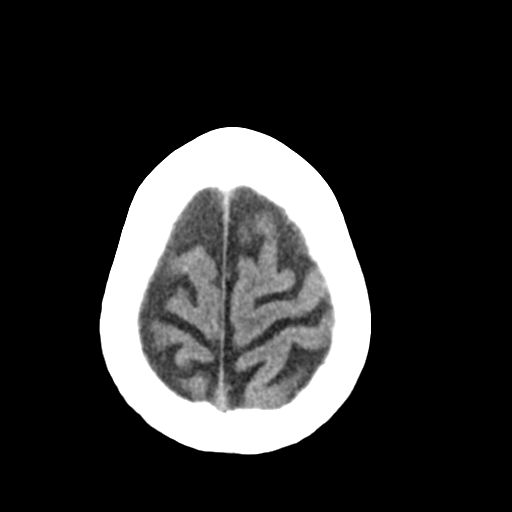
[im 28/30  brain]
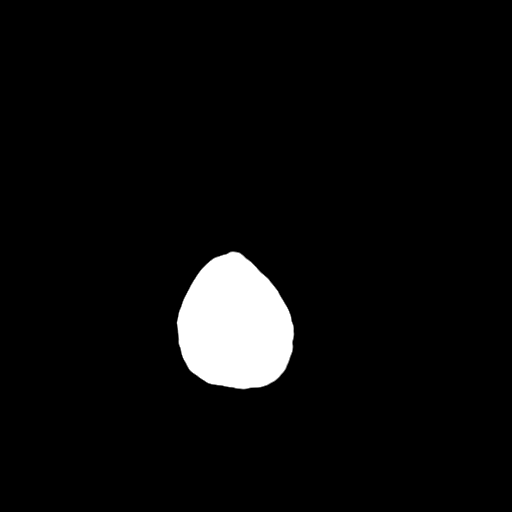

[Series 4: head 3.0 mpr cor · coronal · 0.30mm/px · 3 of 67 slices shown]
[im 23/67  brain]
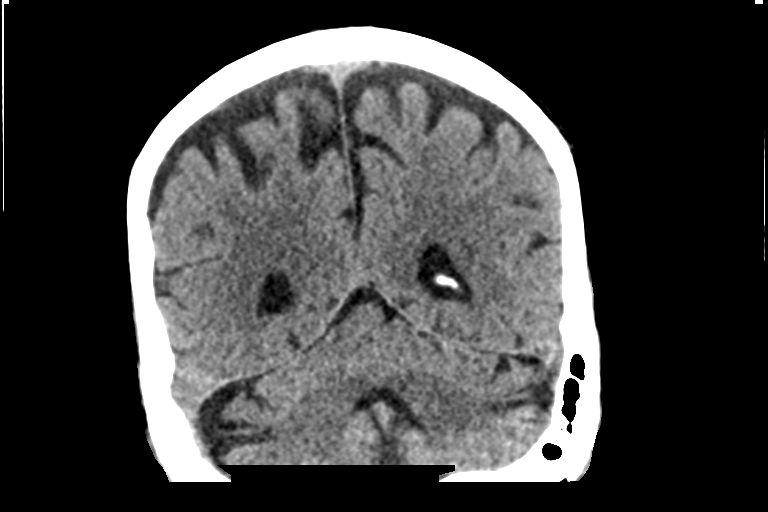
[im 30/67  brain]
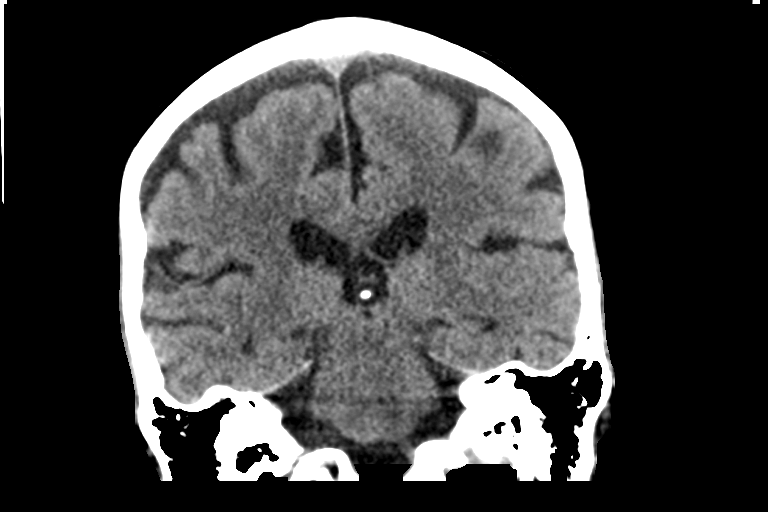
[im 37/67  brain]
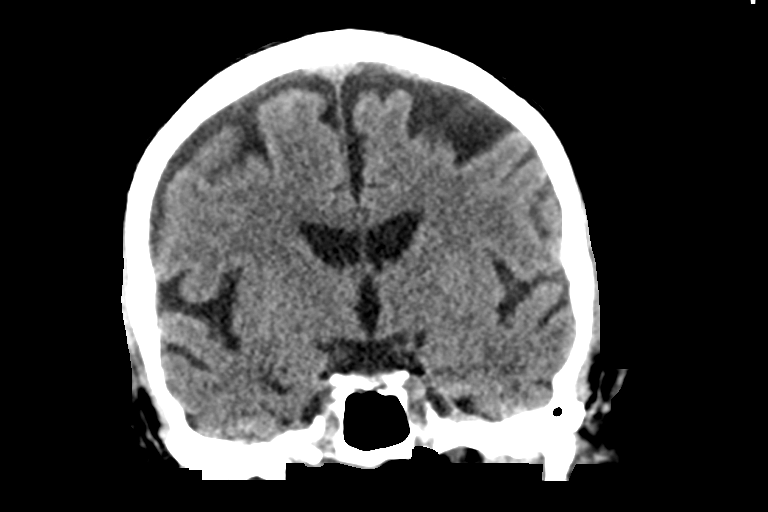

[Series 5: head 3.0 mpr sag · sagittal · 0.30mm/px · 3 of 64 slices shown]
[im 22/64  brain]
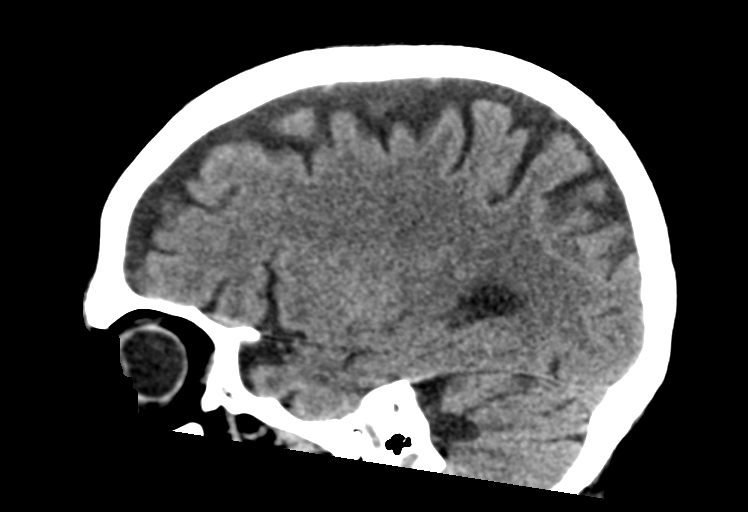
[im 32/64  brain]
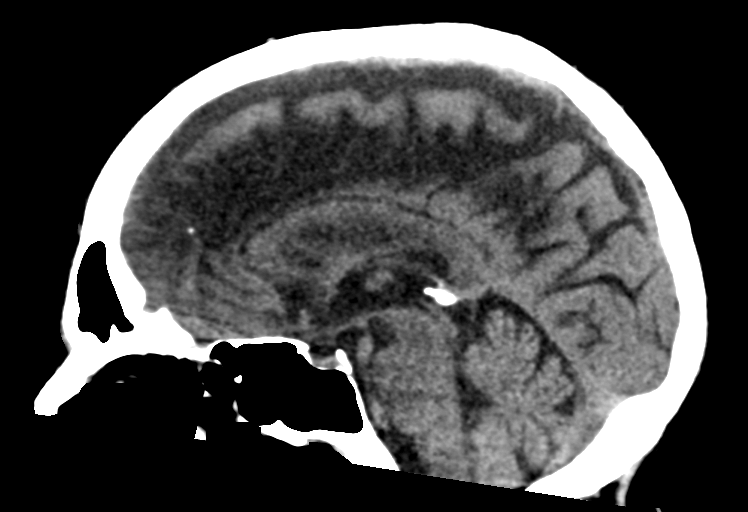
[im 43/64  brain]
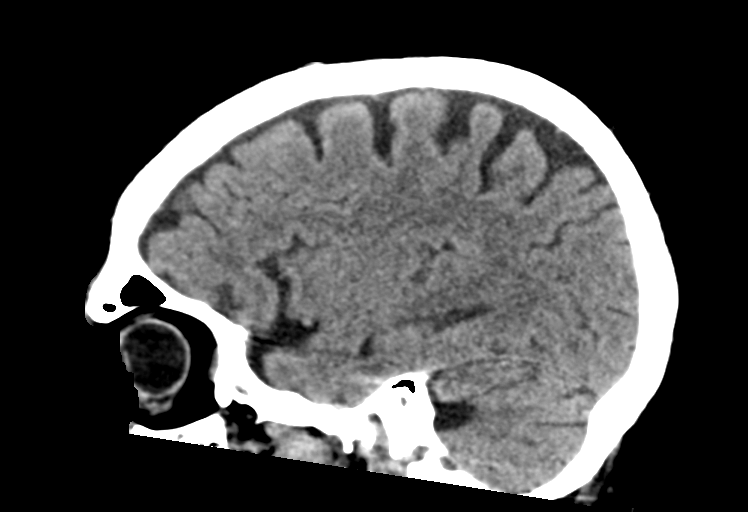

[14 of 47 positions shown; findings below may reference images not displayed]

FINDINGS: Brain: No evidence of acute infarction, hemorrhage, hydrocephalus,
extra-axial collection or mass lesion/mass effect.

Vascular: No hyperdense vessel or unexpected calcification.

Skull: Normal. Negative for fracture or focal lesion.

Sinuses/Orbits: Visualize globes orbits are unremarkable. Visualized
sinuses are relatively clear. Clear mastoid air cells.

Other: None.
IMPRESSION: 1. No acute intracranial abnormalities.

## 2017-12-29 ENCOUNTER — Other Ambulatory Visit: Payer: Self-pay | Admitting: Cardiology

## 2018-03-27 ENCOUNTER — Other Ambulatory Visit: Payer: Self-pay

## 2018-03-27 ENCOUNTER — Other Ambulatory Visit: Payer: Self-pay | Admitting: Cardiology

## 2018-03-27 MED ORDER — ROSUVASTATIN CALCIUM 20 MG PO TABS: 20.0000 mg | ORAL_TABLET | Freq: Every day | ORAL | 0 refills | Status: DC

## 2018-06-07 ENCOUNTER — Other Ambulatory Visit: Payer: Self-pay | Admitting: Cardiology

## 2018-06-08 NOTE — Telephone Encounter (Signed)
Rx request sent to pharmacy.  

## 2018-09-22 ENCOUNTER — Other Ambulatory Visit: Payer: Self-pay | Admitting: Cardiology

## 2018-12-16 ENCOUNTER — Other Ambulatory Visit: Payer: Self-pay | Admitting: Cardiology

## 2018-12-31 ENCOUNTER — Other Ambulatory Visit: Payer: Self-pay | Admitting: Cardiology

## 2018-12-31 NOTE — Telephone Encounter (Signed)
Rx(s) sent to pharmacy electronically.  

## 2019-02-23 ENCOUNTER — Other Ambulatory Visit: Payer: Self-pay | Admitting: Cardiology

## 2019-03-10 ENCOUNTER — Other Ambulatory Visit: Payer: Self-pay | Admitting: Cardiology

## 2019-03-12 ENCOUNTER — Other Ambulatory Visit: Payer: Self-pay | Admitting: Registered"

## 2019-03-12 DIAGNOSIS — Z20822 Contact with and (suspected) exposure to covid-19: Secondary | ICD-10-CM

## 2019-03-14 ENCOUNTER — Telehealth: Payer: Self-pay | Admitting: Internal Medicine

## 2019-03-14 LAB — NOVEL CORONAVIRUS, NAA: SARS-CoV-2, NAA: NOT DETECTED

## 2019-03-14 NOTE — Telephone Encounter (Signed)
COVID  Result given

## 2019-04-06 ENCOUNTER — Other Ambulatory Visit: Payer: Self-pay | Admitting: Cardiology

## 2019-04-11 ENCOUNTER — Other Ambulatory Visit: Payer: Self-pay | Admitting: Cardiology

## 2019-05-03 ENCOUNTER — Other Ambulatory Visit: Payer: Self-pay | Admitting: Cardiology

## 2019-05-03 NOTE — Telephone Encounter (Signed)
Rx request sent to pharmacy.  

## 2019-05-11 ENCOUNTER — Other Ambulatory Visit: Payer: Self-pay | Admitting: Cardiology

## 2019-06-09 ENCOUNTER — Other Ambulatory Visit: Payer: Self-pay | Admitting: Cardiology

## 2019-06-13 NOTE — Telephone Encounter (Signed)
Pt hasn't been seen by provider since 2018. Pt was scheduled  for future appointment 07/30/2019 with Dr.Crenshaw. Pt is requesting refill for metoprolol Succ. 25 mg 1/2 tablet qd. Please advise if ok to refill medication is able to be seen by Crenshaw.

## 2019-07-10 ENCOUNTER — Other Ambulatory Visit: Payer: Self-pay | Admitting: Cardiology

## 2019-07-10 MED ORDER — METOPROLOL SUCCINATE ER 25 MG PO TB24: ORAL_TABLET | ORAL | 0 refills | Status: DC

## 2019-07-10 NOTE — Telephone Encounter (Signed)
*  STAT* If patient is at the pharmacy, call can be transferred to refill team.   1. Which medications need to be refilled? (please list name of each medication and dose if known) Metoprolol  2. Which pharmacy/location (including street and city if local pharmacy) is medication to be sent to? [facility] [address], [place]  3. Do they need a 30 day or 90 day supply? Enough until his appt  07-30-19

## 2019-07-24 NOTE — Progress Notes (Signed)
HPI: Follow-up coronary artery disease. Patient was admitted April 2018 with anterior ST elevation myocardial infarction. Cardiac catheterization revealed a 50% left main, 90% mid LAD with thrombus, 90% ostial obtuse marginal and ejection fraction 35-45%. Preoperative carotid Dopplers showed no carotid disease. Echocardiogram showed ejection fraction 45-50%, moderate aortic insufficiency and mild tricuspid regurgitation. Patient underwent coronary artery bypass graft with a LIMA to the LAD, saphenous vein graft to the second marginal and saphenous vein graft to the PDA. Since last seen 10/18 he does occasionally have dyspnea unchanged.  There is no chest pain, palpitations or syncope.  Current Outpatient Medications  Medication Sig Dispense Refill  . aspirin 81 MG tablet Take 81 mg by mouth daily.    . metoprolol succinate (TOPROL-XL) 25 MG 24 hr tablet TAKE 1/2 TABLET BY MOUTH DAILY *NEED OFFICE VISIT FOR REFILLS* 15 tablet 0  . QUEtiapine (SEROQUEL) 25 MG tablet 25 mg every evening.  2  . rosuvastatin (CRESTOR) 20 MG tablet Take 1 tablet (20 mg total) by mouth daily. NEED OV. 7 tablet 0   No current facility-administered medications for this visit.     Past Medical History:  Diagnosis Date  . Anxiety    takes Diazepam daily prn  . Arthritis   . Constipation   . Coronary artery disease    a. 09/2016 Ant STEMI/Cath: LM 60, LAD 72m/d (thrombotic), LCX nl, OM2 90/75, RCA nl, EF 35-45%;  b. 09/2016 CABG x 3 (LIMA->LAD, VG->OM2, VG->LPDA).  . Depression    takes Trazodone nightly  . Dysarthria   . Enlarged prostate   . GERD (gastroesophageal reflux disease)   . H/O hiatal hernia    takes Omeprazole daily  . Hemorrhoids   . History of colitis    many yrs ago  . History of colon polyps   . History of kidney stones    passed on his own  . Hyperlipidemia    was on medication but has been off for a while  . Insomnia    takes Trazodone nightly  . Ischemic dilated cardiomyopathy  (HCC)    a. 09/2016 LV Gram: EF 35-45%;  b. 09/2016 Echo: EF 45-50%, mild conc LVH, no rwma, Gr1 DD, mod AI, triv MR, mild TR, PASP .  . Memory loss   . 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 Pneumonia 10/26/2016  . Protein calorie malnutrition (HCC) 10/2016  . Urinary frequency     Past Surgical History:  Procedure Laterality Date  . bilateral cataract surgery    . CARDIAC SURGERY    . CIRCUMCISION    . COLONOSCOPY    . CORONARY ARTERY BYPASS GRAFT N/A 10/07/2016   Procedure: CORONARY ARTERY BYPASS GRAFTING (CABG) x 3 , using internal mammary, and right greater saphenous vein harvested endoscopically SVG-PD, SVG-OM, LIMA-LAD;  Surgeon: [name], MD;  Location: [facility] OR;  Service: Open Heart Surgery;  Laterality: N/A;  . DIRECT LARYNGOSCOPY N/A 06/26/2013   Procedure: DIRECT LARYNGOSCOPY;  Surgeon: Christia Reading, MD;  Location: [facility] OR;  Service: ENT;  Laterality: N/A;  . EAR CYST EXCISION N/A 04/05/2013   Procedure: CYST REMOVAL;  Surgeon: Melvenia Beam, MD;  Location: [facility] OR;  Service: ENT;  Laterality: N/A;  . ESOPHAGOGASTRODUODENOSCOPY    . ESOPHAGOGASTRODUODENOSCOPY N/A 07/15/2013   Procedure: ESOPHAGOGASTRODUODENOSCOPY (EGD);  Surgeon: Cherylynn Ridges, MD;  Location: [facility] ENDOSCOPY;  Service: General;  Laterality: N/A;  . HEMORRHOIDECTOMY WITH HEMORRHOID BANDING    . HERNIA REPAIR  double  . LEFT HEART CATH AND CORONARY ANGIOGRAPHY N/A 10/01/2016   Procedure: Left Heart Cath and Coronary Angiography;  Surgeon: Lyn Records, MD;  Location: [facility] INVASIVE CV LAB;  Service: Cardiovascular;  Laterality: N/A;  . PEG PLACEMENT N/A 07/15/2013   Procedure: PERCUTANEOUS ENDOSCOPIC GASTROSTOMY (PEG) PLACEMENT;  Surgeon: Cherylynn Ridges, MD;  Location: [facility] ENDOSCOPY;  Service: General;  Laterality: N/A;  . PERIPHERALLY INSERTED CENTRAL CATHETER INSERTION    . right knee arthroscopy    . SEPTOPLASTY N/A 04/05/2013   Procedure: SEPTOPLASTY;  Surgeon: Melvenia Beam, MD;  Location: [facility] OR;   Service: ENT;  Laterality: N/A;  . SINUS ENDO W/FUSION N/A 07/03/2013   Procedure: ENDOSCOPIC SINUS SURGERY WITH FUSION NAVIGATION with By-Nasopharongeal Biopsy;  Surgeon: Christia Reading, MD;  Location: [facility] OR;  Service: ENT;  Laterality: N/A;  . SINUS EXPLORATION Bilateral 07/11/2013   Procedure:  BEDSIDE  SINUS EXPLORATION ;  Surgeon: Christia Reading, MD;  Location: [facility] OR;  Service: ENT;  Laterality: Bilateral;  . TEE WITHOUT CARDIOVERSION N/A 10/07/2016   Procedure: TRANSESOPHAGEAL ECHOCARDIOGRAM (TEE);  Surgeon: [name], MD;  Location: [facility] OR;  Service: Open Heart Surgery;  Laterality: N/A;  . TONSILLECTOMY      Social History   Socioeconomic History  . Marital status: Married    Spouse name: Myriam Jacobson  . Number of children: Not on file  . Years of education: 28  . Highest education level: Not on file  Occupational History  . Not on file  Tobacco Use  . Smoking status: Former Smoker    Quit date: 07/01/1993    Years since quitting: 26.0  . Smokeless tobacco: Former Neurosurgeon    Types: Chew    Quit date: 12/29/2012  . Tobacco comment: quit chewing tobacco several months ago and stopped smoking cigars yrs ago  Substance and Sexual Activity  . Alcohol use: No  . Drug use: No  . Sexual activity: Never  Other Topics Concern  . Not on file  Social History Narrative   Lives with wife   Social Determinants of Health   Financial Resource Strain:   . Difficulty of Paying Living Expenses: Not on file  Food Insecurity:   . Worried About Programme researcher, broadcasting/film/video in the Last Year: Not on file  . Ran Out of Food in the Last Year: Not on file  Transportation Needs:   . Lack of Transportation (Medical): Not on file  . Lack of Transportation (Non-Medical): Not on file  Physical Activity:   . Days of Exercise per Week: Not on file  . Minutes of Exercise per Session: Not on file  Stress:   . Feeling of Stress : Not on file  Social Connections:   . Frequency of Communication with Friends and Family:  Not on file  . Frequency of Social Gatherings with Friends and Family: Not on file  . Attends Religious Services: Not on file  . Active Member of Clubs or Organizations: Not on file  . Attends Banker Meetings: Not on file  . Marital Status: Not on file  Intimate Partner Violence:   . Fear of Current or Ex-Partner: Not on file  . Emotionally Abused: Not on file  . Physically Abused: Not on file  . Sexually Abused: Not on file    Family History  Problem Relation Age of Onset  . CAD Father     ROS: Difficulties with memory but no fevers or chills, productive cough, hemoptysis, dysphasia, odynophagia, melena,  hematochezia, dysuria, hematuria, rash, seizure activity, orthopnea, PND, pedal edema, claudication. Remaining systems are negative.  Physical Exam: Well-developed elderly in no acute distress.  Skin is warm and dry.  HEENT is normal.  Neck is supple.  Chest is clear to auscultation with normal expansion.  Cardiovascular exam is regular rate and rhythm. 2/6 systolic murmur  Abdominal exam nontender or distended. No masses palpated. Extremities show no edema. neuro grossly intact  ECG-sinus bradycardia with first-degree AV block, normal axis, no ST changes.  Personally reviewed  A/P  1 coronary artery disease-patient doing well with no chest pain.  Continue aspirin and statin.  2 hyperlipidemia-continue statin. Lipids and liver followed by Dr Reynaldo Minium.  3 history of moderate aortic insufficiency-we will arrange follow-up echocardiogram.  4 ischemic cardiomyopathy-LV function mildly reduced on most recent echocardiogram.  Will repeat.  Continue low-dose beta-blocker.  Kirk Ruths, MD

## 2019-07-27 ENCOUNTER — Ambulatory Visit: Payer: Medicare Other

## 2019-07-30 ENCOUNTER — Ambulatory Visit (INDEPENDENT_AMBULATORY_CARE_PROVIDER_SITE_OTHER): Payer: Medicare Other | Admitting: Cardiology

## 2019-07-30 ENCOUNTER — Telehealth: Payer: Self-pay | Admitting: Cardiology

## 2019-07-30 ENCOUNTER — Other Ambulatory Visit: Payer: Self-pay

## 2019-07-30 ENCOUNTER — Encounter: Payer: Self-pay | Admitting: Cardiology

## 2019-07-30 VITALS — BP 130/58 | HR 52 | Temp 97.3°F | Ht 63.0 in | Wt 142.0 lb

## 2019-07-30 DIAGNOSIS — I2510 Atherosclerotic heart disease of native coronary artery without angina pectoris: Secondary | ICD-10-CM

## 2019-07-30 DIAGNOSIS — E7849 Other hyperlipidemia: Secondary | ICD-10-CM

## 2019-07-30 DIAGNOSIS — I255 Ischemic cardiomyopathy: Secondary | ICD-10-CM | POA: Diagnosis not present

## 2019-07-30 NOTE — Telephone Encounter (Signed)
Patient's wife calling to requesting she come with the patient to his appointment today 2/2 at 2. She states he has trouble hearing and remembering.

## 2019-07-30 NOTE — Patient Instructions (Signed)
Medication Instructions:  NO CHANGE *If you need a refill on your cardiac medications before your next appointment, please call your pharmacy*  Lab Work: If you have labs (blood work) drawn today and your tests are completely normal, you will receive your results only by: . MyChart Message (if you have MyChart) OR . A paper copy in the mail If you have any lab test that is abnormal or we need to change your treatment, we will call you to review the results.  Testing/Procedures: Your physician has requested that you have an echocardiogram. Echocardiography is a painless test that uses sound waves to create images of your heart. It provides your doctor with information about the size and shape of your heart and how well your heart's chambers and valves are working. This procedure takes approximately one hour. There are no restrictions for this procedure.[address]    Follow-Up: At [facility], you and your health needs are our priority.  As part of our continuing mission to provide you with exceptional heart care, we have created designated Provider Care Teams.  These Care Teams include your primary Cardiologist (physician) and Advanced Practice Providers (APPs -  Physician Assistants and Nurse Practitioners) who all work together to provide you with the care you need, when you need it.  Your next appointment:   12 month(s)  The format for your next appointment:   Either In Person or Virtual  Provider:   You may see BRIAN CRENSHAW MD or one of the following Advanced Practice Providers on your designated Care Team:    Luke Kilroy, PA-C  Callie Goodrich, PA-C  Jesse Cleaver, FNP    

## 2019-07-30 NOTE — Telephone Encounter (Signed)
Returned call to wife-advised ok to come to appt.   

## 2019-08-01 ENCOUNTER — Ambulatory Visit: Payer: Medicare Other | Attending: Internal Medicine

## 2019-08-01 DIAGNOSIS — Z23 Encounter for immunization: Secondary | ICD-10-CM | POA: Insufficient documentation

## 2019-08-01 NOTE — Progress Notes (Signed)
   Covid-19 Vaccination Clinic  Name:  Jason [name]    MRN: [id number] DOB: 02/03/1930  08/01/2019  [name] was observed post Covid-19 immunization for 15 minutes without incidence. He was provided with Vaccine Information Sheet and instruction to access the V-Safe system.   [name] was instructed to call 911 with any severe reactions post vaccine: Marland Kitchen Difficulty breathing  . Swelling of your face and throat  . A fast heartbeat  . A bad rash all over your body  . Dizziness and weakness    Immunizations Administered    Name Date Dose VIS Date Route   Pfizer COVID-19 Vaccine 08/01/2019  1:57 PM 0.3 mL 06/07/2019 Intramuscular   Manufacturer: [employer], [employer]   Lot: [id number]   NDC: [address]-0

## 2019-08-03 ENCOUNTER — Ambulatory Visit: Payer: Medicare Other

## 2019-08-07 ENCOUNTER — Ambulatory Visit: Payer: Medicare Other

## 2019-08-08 ENCOUNTER — Ambulatory Visit (HOSPITAL_COMMUNITY): Payer: Medicare Other | Attending: Cardiology

## 2019-08-08 ENCOUNTER — Other Ambulatory Visit: Payer: Self-pay

## 2019-08-08 ENCOUNTER — Other Ambulatory Visit: Payer: Self-pay | Admitting: Cardiology

## 2019-08-08 DIAGNOSIS — I255 Ischemic cardiomyopathy: Secondary | ICD-10-CM | POA: Diagnosis not present

## 2019-08-16 ENCOUNTER — Telehealth: Payer: Self-pay | Admitting: Cardiology

## 2019-08-16 NOTE — Telephone Encounter (Signed)
Jason Bunting, MD  08/08/2019 4:41 PM EST    Normal LV function Jason [name]   Patient called w/results Explained that results were sent to MyChart on 2/16 but patient does not check this ever Offered to deactivate account, to which they agreed

## 2019-08-16 NOTE — Telephone Encounter (Signed)
  Patient would like someone to call him to go over his echo results with him.

## 2019-08-26 ENCOUNTER — Ambulatory Visit: Payer: Medicare Other | Attending: Internal Medicine

## 2019-08-26 DIAGNOSIS — Z23 Encounter for immunization: Secondary | ICD-10-CM

## 2019-08-26 NOTE — Progress Notes (Signed)
   Covid-19 Vaccination Clinic  Name:  DETRIC SCALISI    MRN: [id number] DOB: 1929-09-10  08/26/2019  Mr. Florentino was observed post Covid-19 immunization for 15 minutes without incidence. He was provided with Vaccine Information Sheet and instruction to access the V-Safe system.   Mr. Trettel was instructed to call 911 with any severe reactions post vaccine: Marland Kitchen Difficulty breathing  . Swelling of your face and throat  . A fast heartbeat  . A bad rash all over your body  . Dizziness and weakness    Immunizations Administered    Name Date Dose VIS Date Route   Pfizer COVID-19 Vaccine 08/26/2019  4:35 PM 0.3 mL 06/07/2019 Intramuscular   Manufacturer: [employer], [employer]   Lot: [id number]   NDC: [address]-7

## 2020-02-26 ENCOUNTER — Other Ambulatory Visit: Payer: Self-pay | Admitting: Cardiology

## 2020-05-05 ENCOUNTER — Telehealth: Payer: Self-pay | Admitting: Cardiology

## 2020-05-05 NOTE — Telephone Encounter (Signed)
Returned call and had a prolonged call with Ms. Cappelletti.   He was recently admitted 04/29/20-05/03/20 for FTT. Wife had contacted hospice who felt the patient did not qualify for hospice - symptoms were HA, dizziness, and disoriented.  Hospice felt that there was an underlying cause for his symptoms and recommended ER visit. He takes seroquel and remeron was added during his hospitalization - which is doing well for him.  He is not eating and drinking and has unintentional weight loss of 20 lbs. Wife reports that he is regaining his appetite and is now reading the newspaper. He was not eating and drinking because he was constipated.  Her main complaint is his persistent dizziness. He has not fallen since discharge. No syncope. He is eating a little better.    Sounds like he needs a little more PO intake and hydration. However, his wife reports that he wants to die. I think they need hospice back in place. It appears that discharge planning included hospice and palliative care as well as HHN and HHPT. It sounds like the patient was refusing services prior to discharge.  From a cardiac standpoint, we can try to stop his 12.5 mg lopressor to see if that improves his dizziness. Current BP is 124/65, HR 62.  I will send to Dr. Jens Som for recommendations.

## 2020-05-05 NOTE — Telephone Encounter (Signed)
STAT if patient feels like he/she is going to faint   1) Are you dizzy now? Yes  2) Do you feel faint or have you passed out?  No   3) Do you have any other symptoms? A little disoriented, headache lingering for a few weeks. Main concern is the dizziness.  4) Have you checked your HR and BP (record if available)?  Last night estimated 120/60 w HR 60 - patients wife did not have log in front of her.   Patients wife called in to figure out if there's anything she should do regarding the dizziness - please call/advise.

## 2020-05-06 NOTE — Addendum Note (Signed)
Addended by: Madelin Rear E on: 05/06/2020 11:13 AM   Modules accepted: Orders

## 2020-05-06 NOTE — Telephone Encounter (Signed)
Needs fu with primary care Jason [name]

## 2020-05-06 NOTE — Telephone Encounter (Signed)
Pt wife returning call. 

## 2020-05-06 NOTE — Telephone Encounter (Signed)
Lm to call back ./cy 

## 2020-05-06 NOTE — Telephone Encounter (Signed)
Spoke with patient's spouse, informed her of Dr. Ludwig Clarks recommendation. She feels as though she was dismissed by PCP and hospice.   Patient continues to have dizziness. Per Bettina Gavia, PAs last telephone note "From a cardiac standpoint, we can try to stop his 12.5 mg lopressor to see if that improves his dizziness." Advised to discontinue the metoprolol and keep a blood pressure log for at least the next week. Spouse verbalized understanding. She will also follow up with Dr. Jacky Kindle. Spouse to call back with further issues and to keep updated on blood pressure readings.

## 2020-05-13 ENCOUNTER — Telehealth: Payer: Self-pay

## 2020-05-13 NOTE — Telephone Encounter (Signed)
Attempted to call multiple times to schedule Palliative consult. Only contact number in chart is giving a busy signal. LNW

## 2020-05-13 NOTE — Telephone Encounter (Signed)
Spoke with patient and have scheduled an In-person Consult for 05/29/20 @ 12:30 PM.  COVID screening was negative. No pets in home. Patient lives with wife Myriam Jacobson.  Consent obtained; updated Outlook/Netsmart/Team List and Epic.

## 2020-05-29 ENCOUNTER — Other Ambulatory Visit: Payer: Self-pay

## 2020-05-29 ENCOUNTER — Other Ambulatory Visit: Payer: Medicare Other | Admitting: Internal Medicine

## 2020-06-16 ENCOUNTER — Telehealth: Payer: Self-pay | Admitting: Cardiology

## 2020-06-16 NOTE — Telephone Encounter (Signed)
Spoke with pt wife, she is wanting to get the patient off the seroquel. Aware we do not take care of those types of medications and she would need to discuss with his medical doctor. She is having problems with the medical doctor and is thinking of just stopping the seroquel. Advised this medications can not be stopped abruptly and should be tapered to 1/2 tablet once daily for several days then 1/2 tablet every other day before stopping it. She voiced understanding and agreed to follow up with someone regarding these types of medications.

## 2020-06-16 NOTE — Telephone Encounter (Signed)
New Message  Pt c/o medication issue:  1. Name of Medication: QUEtiapine (SEROQUEL) 25 MG tablet Mirtazapine 15 mg 1 x daily   2. How are you currently taking this medication (dosage and times per day)? 25mg  daily     3. Are you having a reaction (difficulty breathing--STAT)? No   4. What is your medication issue? Pts wife is wanting the pt to stop taking these medications   Please call

## 2020-07-02 ENCOUNTER — Telehealth: Payer: Self-pay

## 2020-07-02 NOTE — Telephone Encounter (Signed)
I connected by phone with Jason [name]'s wife Vasili Fok) on 07/02/2020 at 4:00 PM to discuss the potential vaccination through our Homebound vaccination initiative.   Prevaccination Checklist for COVID-19 Vaccines  1.  Are you feeling sick today? no  2.  Have you ever received a dose of a COVID-19 vaccine?  yes      If yes, which one? Pfizer   How many dose of Covid-19 vaccine have your received and dates ? 08/01/19 and 08/26/19, both Pfizer   Check all that apply: I live in a long-term care setting. no  I have been diagnosed with a medical condition(s). Please list: dementia and severe mobility issues (pertinent to homebound status)  I am a first responder. no  I work in a long-term care facility, correctional facility, hospital, restaurant, retail setting, school, or other setting with high exposure to the public. no  4. Do you have a health condition or are you undergoing treatment that makes you moderately or severely immunocompromised? (This would include treatment for cancer or HIV, receipt of organ transplant, immunosuppressive therapy or high-dose corticosteroids, CAR-T-cell therapy, hematopoietic cell transplant [HCT], DiGeorge syndrome or Wiskott-Aldrich syndrome)  no  5. Have you received hematopoietic cell transplant (HCT) or CAR-T-cell therapies since receiving COVID-19 vaccine? no  6.  Have you ever had an allergic reaction: (This would include a severe reaction [ e.g., anaphylaxis] that required treatment with epinephrine or EpiPen or that caused you to go to the hospital.  It would also include an allergic reaction that occurred within 4 hours that caused hives, swelling, or respiratory distress, including wheezing.) YES- Bee stings cause anaphylaxis.  A.  A previous dose of COVID-19 vaccine. no  B.  A vaccine or injectable therapy that contains multiple components, one of which is a COVID-19 vaccine component, but it is not known which component elicited the immediate  reaction. no  C.  Are you allergic to polyethylene glycol? no  D. Are you allergic to Polysorbate, which is found in some vaccines, film coated tablets and intravenous steroids?  no   7.  Have you ever had an allergic reaction to another vaccine (other than COVID-19 vaccine) or an injectable medication? (This would include a severe reaction [ e.g., anaphylaxis] that required treatment with epinephrine or EpiPen or that caused you to go to the hospital.  It would also include an allergic reaction that occurred within 4 hours that caused hives, swelling, or respiratory distress, including wheezing.)  no   8.  Have you ever had a severe allergic reaction (e.g., anaphylaxis) to something other than a component of the COVID-19 vaccine, or any vaccine or injectable medication?  This would include food, pet, venom, environmental, or oral medication allergies.  yes, Bee sting causing anaphylaxis  Check all that apply to you:  Am a male between ages 23 and 68 years old  no  Women 63 through 85 years of age can receive any FDA-authorized or -approved COVID-19 vaccine. However, they should be informed of the rare but increased risk of thrombosis with thrombocytopenia syndrome (TTS) after receipt of the [employer] Vaccine and the availability of other FDA-authorized and -approved COVID-19 vaccines. People who had TTS after a first dose of Janssen vaccine should not receive a subsequent dose of Janssen product    Am a male between ages 40 and 58 years old  no Males 5 through 85 years of age may receive the correct formulation of Pfizer-BioNTech COVID-19 vaccine. Males 18 and older can  receive any FDA-authorized or -approved vaccine. However, people receiving an mRNA COVID-19 vaccine, especially males 2 through 85 years of age and their parents/legal representative (when relevant), should be informed of the risk of developing myocarditis (an inflammation of the heart muscle) or pericarditis (inflammation of  the lining around the heart) after receipt of an mRNA vaccine. The risk of developing either myocarditis or pericarditis after vaccination is low, and lower than the risk of myocarditis associated with SARS-CoV-2 infection in adolescents and adults. Vaccine recipients should be counseled about the need to seek care if symptoms of myocarditis or pericarditis develop after vaccination     Have a history of myocarditis or pericarditis  no Myocarditis or pericarditis after receipt of the first dose of an mRNA COVID-19 vaccine series but before administration of the second dose  Experts advise that people who develop myocarditis or pericarditis after a dose of an mRNA COVID-19 vaccine not receive a subsequent dose of any COVID-19 vaccine, until additional safety data are available.  Administration of a subsequent dose of COVID-19 vaccine before safety data are available can be considered in certain circumstances after the episode of myocarditis or pericarditis has completely resolved. Until additional data are available, some experts recommend a Al[employer]a [employer] COVID-19 vaccine be considered instead of an mRNA COVID-19 vaccine. Decisions about proceeding with a subsequent dose should include a conversation between the patient, their parent/legal representative (when relevant), and their clinical team, which may include a cardiologist.    Have been treated with monoclonal antibodies or convalescent serum to prevent or treat COVID-19  no Vaccination should be offered to people regardless of history of prior symptomatic or asymptomatic SARS-CoV-2 infection. There is no recommended minimal interval between infection and vaccination.  However, vaccination should be deferred if a patient received monoclonal antibodies or convalescent serum as treatment for COVID-19 or for post-exposure prophylaxis. This is a precautionary measure until additional information becomes available, to avoid interference of the antibody treatment  with vaccine-induced immune responses.  Defer COVID-19 vaccination for 30 days when a passive antibody product was used for post-exposure prophylaxis.  Defer COVID-19 vaccination for 90 days when a passive antibody product was used to treat COVID-19.     Diagnosed with Multisystem Inflammatory Syndrome (MIS-C or MIS-A) after a COVID-19 infection  no It is unknown if people with a history of MIS-C or MIS-A are at risk for a dysregulated immune response to COVID-19 vaccination.  People with a history of MIS-C or MIS-A may choose to be vaccinated. Considerations for vaccination may include:   Clinical recovery from MIS-C or MIS-A, including return to normal cardiac function   Personal risk of severe acute COVID-19 (e.g., age, underlying conditions)   High or substantial community transmission of SARS-CoV-2 and personal increased risk of reinfection.   Timing of any immunomodulatory therapies (general best practice guidelines for immunization can be consulted for more information [email or web address])   It has been 90 days or more since their diagnosis of MIS-C   Onset of MIS-C occurred before any COVID-19 vaccination   A conversation between the patient, their guardian(s), and their clinical team or a specialist may assist with COVID-19 vaccination decisions. Healthcare providers and health departments may also request a consultation from the [place] at [email or web address].     Have a bleeding disorder  no Take a blood thinner  no As with all vaccines, any COVID-19 vaccine product may be given to these patients, if a physician familiar with the patient's bleeding  risk determines that the vaccine can be administered intramuscularly with reasonable safety.  ACIP recommends the following technique for intramuscular vaccination in patients with bleeding disorders or  taking blood thinners: a fine-gauge needle (23-gauge or smaller caliber) should be used for the vaccination, followed by firm pressure on the site, without rubbing, for at least 2 minutes.  People who regularly take aspirin or anticoagulants as part of their routine medications do not need to stop these medications prior to receipt of any COVID-19 vaccine.    Have a history of heparin-induced thrombocytopenia (HIT)  no Although the etiology of TTS associated with the [employer] COVID-19 vaccine is unclear, it appears to be similar to another rare immune-mediated syndrome, heparin-induced thrombocytopenia (HIT). People with a history of an episode of an immune-mediated syndrome characterized by thrombosis and thrombocytopenia, such as HIT, should be offered a currently FDA-approved or FDA-authorized mRNA COVID-19 vaccine if it has been ?90 days since their TTS resolved. After 90 days, patients may be vaccinated with any currently FDA-approved or FDA-authorized COVID-19 vaccine, including Janssen COVID-19 Vaccine. However, people who developed TTS after their initial [employer] vaccine should not receive a Janssen booster dose.  Experts believe the following factors do not make people more susceptible to TTS after receipt of the [employer]. People with these conditions can be vaccinated with any FDA-authorized or - approved COVID-19 vaccine, including the [employer] COVID-19 Vaccine:   A prior history of venous thromboembolism   Risk factors for venous thromboembolism (e.g., inherited or acquired thrombophilia including Factor V Leiden; prothrombin gene 20210A mutation; antiphospholipid syndrome; protein C, protein S or antithrombin deficiency   A prior history of other types of thromboses not associated with thrombocytopenia   Pregnancy, post-partum status, or receipt of hormonal contraceptives (e.g., combined oral contraceptives, patch, ring)   Additional recipient education materials can be found  at AffordableShare.com.br vaccines/safety/JJUpdate.html.    Am currently pregnant or breastfeeding  no Vaccination is recommended for all people aged 73 years and older, including people that are:   Pregnant   Breastfeeding   Trying to get pregnant now or who might become pregnant in the future   Pregnant, breastfeeding, and post-partum people 88 through 85 years of age should be aware of the rare risk of TTS after receipt of the [employer] COVID-19 Vaccine and the availability of other FDA-authorized or -approved COVID-19 vaccines (i.e., mRNA vaccines).    Have received dermal fillers  no FDA-authorized or -approved COVID-19 vaccines can be administered to people who have received injectable dermal fillers who have no contraindications for vaccination.  Infrequently, these people might experience temporary swelling at or near the site of filler injection (usually the face or lips) following administration of a dose of an mRNA COVID-19 vaccine. These people should be advised to contact their healthcare provider if swelling develops at or near the site of dermal filler following vaccination.     Have a history of Guillain-Barr Syndrome (GBS)  no People with a history of GBS can receive any FDA-authorized or -approved COVID-19 vaccine. However, given the possible association between the [employer] and an increased risk of GBS, a patient with a history of GBS and their clinical team should discuss the availability of mRNA vaccines to offer protection against COVID-19. The highest risk has been observed in men aged 50-64 years with symptoms of GBS beginning within 42 days after [employer] COVID-19 vaccination.  People who had GBS after receiving Janssen vaccine should be made aware of the option to  receive an mRNA COVID-19 vaccine booster at least 2 months (8 weeks) after the Janssen dose. However, [employer] vaccine may be used as a booster, particularly if GBS occurred more than 42  days after vaccination or was related to a non-vaccine factor. Prior to booster vaccination, a conversation between the patient and their clinical team may assist with decisions about use of a COVID-19 booster dose, including the timing of administration     Postvaccination Observation Times for People without Contraindications to Covid 19 Vaccination.  30 minutes:  People with a history of: A contraindication to another type of COVID-19 vaccine product (i.e., mRNA or viral vector COVID-19 vaccines)   Immediate (within 4 hours of exposure) non-severe allergic reaction to a COVID-19 vaccine or injectable therapies   Anaphylaxis due to any cause   Immediate allergic reaction of any severity to a non-COVID-19 vaccine   15 minutes: All other people  This patient is a 87 y.o. male that meets the FDA criteria to receive homebound vaccination. Patient or parent/caregiver understands they have the option to accept or refuse homebound vaccination.  Patient passed the pre-screening checklist and would like to proceed with homebound vaccination.  Based on questionnaire above, I recommend the patient be observed for 30 minutes.  There are an estimated #0 other household members/caregivers who are also interested in receiving the vaccine.    The patient has been confirmed homebound and eligible for homebound vaccination with the considerations outlined above. I will send the patient's information to our scheduling team who will reach out to schedule the patient and potential caregiver/family members for homebound vaccination.    Sidney Ace 07/02/2020 4:16 PM

## 2020-07-28 ENCOUNTER — Ambulatory Visit: Attending: Internal Medicine

## 2020-07-28 DIAGNOSIS — Z23 Encounter for immunization: Secondary | ICD-10-CM

## 2020-07-28 NOTE — Progress Notes (Signed)
   Covid-19 Vaccination Clinic  Name:  Jason [name]    MRN: [id number] DOB: 09-Jul-1929  07/28/2020  [name] was observed post Covid-19 immunization for 15 minutes without incident. He was provided with Vaccine Information Sheet and instruction to access the V-Safe system.   [name] was instructed to call 911 with any severe reactions post vaccine: Marland Kitchen Difficulty breathing  . Swelling of face and throat  . A fast heartbeat  . A bad rash all over body  . Dizziness and weakness   Immunizations Administered    Name Date Dose VIS Date Route   PFIZER Comrnaty(Gray TOP) Covid-19 Vaccine 07/28/2020 12:50 PM 0.3 mL 06/04/2020 Intramuscular   Manufacturer: [employer], [employer]   Lot: [id number]   NDC: [phone]

## 2020-09-21 NOTE — Progress Notes (Addendum)
Virtual Visit via Telephone Note   This visit type was conducted due to national recommendations for restrictions regarding the COVID-19 Pandemic (e.g. social distancing) in an effort to limit this patient's exposure and mitigate transmission in our community.  Due to his co-morbid illnesses, this patient is at least at moderate risk for complications without adequate follow up.  This format is felt to be most appropriate for this patient at this time.  The patient did not have access to video technology/had technical difficulties with video requiring transitioning to audio format only (telephone).  All issues noted in this document were discussed and addressed.  No physical exam could be performed with this format.  Please refer to the patient's chart for his  consent to telehealth for [facility].   Date:  09/22/2020   ID:  Jason [name], DOB 1929-08-05, MRN [id number]  Patient Location:Home Provider Location: office   PCP:  Geoffry Paradise, MD  Cardiologist:  Dr Jens Som  Evaluation Performed:  Follow-Up Visit  Chief Complaint:    History of Present Illness:    Follow-up coronary artery disease. Patient was admitted April 2018 with anterior ST elevation myocardial infarction. Cardiac catheterization revealed a 50% left main, 90% mid LAD with thrombus, 90% ostial obtuse marginal and ejection fraction 35-45%. Preoperative carotid Dopplers showed no carotid disease. Patient underwent coronary artery bypass graft with a LIMA to the LAD, saphenous vein graft to the second marginal and saphenous vein graft to the PDA.  Echocardiogram February 2021 showed normal LV function, grade 1 diastolic dysfunction, mild aortic insufficiency, mitral regurgitation and tricuspid regurgitation.  Since last seenpatient is now on hospice.  He is not having dyspnea or chest pain but general malaise.  He is not eating or taking his medications.  The patient does not have symptoms concerning for COVID-19  infection (fever, chills, cough, or new shortness of breath).    Past Medical History:  Diagnosis Date  . Anxiety    takes Diazepam daily prn  . Arthritis   . Constipation   . Coronary artery disease    a. 09/2016 Ant STEMI/Cath: LM 60, LAD 77m/d (thrombotic), LCX nl, OM2 90/75, RCA nl, EF 35-45%;  b. 09/2016 CABG x 3 (LIMA->LAD, VG->OM2, VG->LPDA).  . Depression    takes Trazodone nightly  . Dysarthria   . Enlarged prostate   . GERD (gastroesophageal reflux disease)   . H/O hiatal hernia    takes Omeprazole daily  . Hemorrhoids   . History of colitis    many yrs ago  . History of colon polyps   . History of kidney stones    passed on his own  . Hyperlipidemia    was on medication but has been off for a while  . Insomnia    takes Trazodone nightly  . Ischemic dilated cardiomyopathy (HCC)    a. 09/2016 LV Gram: EF 35-45%;  b. 09/2016 Echo: EF 45-50%, mild conc LVH, no rwma, Gr1 DD, mod AI, triv MR, mild TR, PASP .  . Memory loss   . 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 Pneumonia 10/26/2016  . Protein calorie malnutrition (HCC) 10/2016  . Urinary frequency    Past Surgical History:  Procedure Laterality Date  . bilateral cataract surgery    . CARDIAC SURGERY    . CIRCUMCISION    . COLONOSCOPY    . CORONARY ARTERY BYPASS GRAFT N/A 10/07/2016   Procedure: CORONARY ARTERY BYPASS GRAFTING (CABG) x 3 , using internal mammary, and right  greater saphenous vein harvested endoscopically SVG-PD, SVG-OM, LIMA-LAD;  Surgeon: [name], MD;  Location: [facility] OR;  Service: Open Heart Surgery;  Laterality: N/A;  . DIRECT LARYNGOSCOPY N/A 06/26/2013   Procedure: DIRECT LARYNGOSCOPY;  Surgeon: Christia Reading, MD;  Location: [facility] OR;  Service: ENT;  Laterality: N/A;  . EAR CYST EXCISION N/A 04/05/2013   Procedure: CYST REMOVAL;  Surgeon: Melvenia Beam, MD;  Location: [facility] OR;  Service: ENT;  Laterality: N/A;  . ESOPHAGOGASTRODUODENOSCOPY    . ESOPHAGOGASTRODUODENOSCOPY N/A 07/15/2013    Procedure: ESOPHAGOGASTRODUODENOSCOPY (EGD);  Surgeon: Cherylynn Ridges, MD;  Location: [facility] ENDOSCOPY;  Service: General;  Laterality: N/A;  . HEMORRHOIDECTOMY WITH HEMORRHOID BANDING    . HERNIA REPAIR     double  . LEFT HEART CATH AND CORONARY ANGIOGRAPHY N/A 10/01/2016   Procedure: Left Heart Cath and Coronary Angiography;  Surgeon: Lyn Records, MD;  Location: [facility] INVASIVE CV LAB;  Service: Cardiovascular;  Laterality: N/A;  . PEG PLACEMENT N/A 07/15/2013   Procedure: PERCUTANEOUS ENDOSCOPIC GASTROSTOMY (PEG) PLACEMENT;  Surgeon: Cherylynn Ridges, MD;  Location: [facility] ENDOSCOPY;  Service: General;  Laterality: N/A;  . PERIPHERALLY INSERTED CENTRAL CATHETER INSERTION    . right knee arthroscopy    . SEPTOPLASTY N/A 04/05/2013   Procedure: SEPTOPLASTY;  Surgeon: Melvenia Beam, MD;  Location: [facility] OR;  Service: ENT;  Laterality: N/A;  . SINUS ENDO W/FUSION N/A 07/03/2013   Procedure: ENDOSCOPIC SINUS SURGERY WITH FUSION NAVIGATION with By-Nasopharongeal Biopsy;  Surgeon: Christia Reading, MD;  Location: [facility] OR;  Service: ENT;  Laterality: N/A;  . SINUS EXPLORATION Bilateral 07/11/2013   Procedure:  BEDSIDE  SINUS EXPLORATION ;  Surgeon: Christia Reading, MD;  Location[facility] OR;  Service: ENT;  Laterality: Bilateral;  . TEE WITHOUT CARDIOVERSION N/A 10/07/2016   Procedure: TRANSESOPHAGEAL ECHOCARDIOGRAM (TEE);  Surgeon: [name], MD;  Location: [facility] OR;  Service: Open Heart Surgery;  Laterality: N/A;  . TONSILLECTOMY       No outpatient medications have been marked as taking for the 09/22/20 encounter (Telemedicine) with Lewayne Bunting, MD.     Allergies:   Bee pollen, Seroquel [quetiapine fumarate], Tetanus toxoids, and Tramadol   Social History   Tobacco Use  . Smoking status: Former Smoker    Quit date: 07/01/1993    Years since quitting: 27.2  . Smokeless tobacco: Former Neurosurgeon    Types: Chew    Quit date: 12/29/2012  . Tobacco comment: quit chewing tobacco several months ago and stopped smoking cigars  yrs ago  Vaping Use  . Vaping Use: Never used  Substance Use Topics  . Alcohol use: No  . Drug use: No     Family Hx: The patient's family history includes CAD in his father.  ROS:   Please see the history of present illness.    No Fever, chills  or productive cough All other systems reviewed and are negative.  Recent Lipid Panel Lab Results  Component Value Date/Time   CHOL 118 04/05/2017 11:27 AM   TRIG 125 04/05/2017 11:27 AM   HDL 45 04/05/2017 11:27 AM   CHOLHDL 2.6 04/05/2017 11:27 AM   CHOLHDL 2.8 12/02/2016 08:59 AM   LDLCALC 48 04/05/2017 11:27 AM    Wt Readings from Last 3 Encounters:  09/22/20 113 lb (51.3 kg)  07/30/19 142 lb (64.4 kg)  04/13/17 134 lb (60.8 kg)     Objective:    Vital Signs:  Ht 5' 2.5" (1.588 m)   Wt 113 lb (  51.3 kg)   BMI 20.34 kg/m    VITAL SIGNS:  reviewed NAD Answers questions appropriately Remainder of physical examination not performed (telehealth visit; coronavirus pandemic)  ASSESSMENT & PLAN:    1. Coronary artery disease-patient is now on hospice and has general malaise.  He has not taking his medications and his wife states he is not eating.  We will not pursue further evaluation or therapy at this time.  We will see him back as needed. 2. Hyperlipidemia-patient not taking his medications as outlined above. 3. Aortic insufficiency-no plans for further evaluation given hospice care. 4. Ischemic cardiomyopathy-not taking his medications.  COVID-19 Education: The importance of social distancing was discussed today.  Time:   Today, I have spent 16 minutes with the patient with telehealth technology discussing the above problems.     Medication Adjustments/Labs and Tests Ordered: Current medicines are reviewed at length with the patient today.  Concerns regarding medicines are outlined above.   Tests Ordered: No orders of the defined types were placed in this encounter.   Medication Changes: No orders of the  defined types were placed in this encounter.   Follow Up:  PRN  Signed, Olga Millers, MD  09/22/2020 9:21 AM    [facility]

## 2020-09-22 ENCOUNTER — Telehealth (INDEPENDENT_AMBULATORY_CARE_PROVIDER_SITE_OTHER): Payer: Medicare Other | Admitting: Cardiology

## 2020-09-22 ENCOUNTER — Encounter: Payer: Self-pay | Admitting: Cardiology

## 2020-09-22 VITALS — Ht 62.5 in | Wt 113.0 lb

## 2020-09-22 DIAGNOSIS — I2510 Atherosclerotic heart disease of native coronary artery without angina pectoris: Secondary | ICD-10-CM | POA: Diagnosis not present

## 2020-09-22 DIAGNOSIS — I255 Ischemic cardiomyopathy: Secondary | ICD-10-CM

## 2020-09-22 DIAGNOSIS — E7849 Other hyperlipidemia: Secondary | ICD-10-CM | POA: Diagnosis not present

## 2020-09-22 NOTE — Patient Instructions (Signed)

## 2021-04-27 ENCOUNTER — Telehealth: Payer: Self-pay

## 2021-04-27 NOTE — Telephone Encounter (Signed)
Spoke with patient's wife Myriam Jacobson and scheduled an in-person Palliative Consult for 05/19/21 @ 1PM with Dr. Bufford Spikes. Documentation will be noted in [facility]'s EMR Netsmart.   COVID screening was negative. No pets in home. Patient lives with wife.  Consent obtained; updated Outlook/Netsmart/Team List and Epic.   Family is aware they may be receiving a call from provider the day before or day of to confirm appointment.

## 2021-09-16 ENCOUNTER — Other Ambulatory Visit: Payer: Self-pay

## 2021-09-16 ENCOUNTER — Other Ambulatory Visit: Payer: Medicare Other | Admitting: *Deleted

## 2021-12-29 ENCOUNTER — Other Ambulatory Visit: Payer: Medicare Other | Admitting: *Deleted

## 2021-12-29 DIAGNOSIS — Z515 Encounter for palliative care: Secondary | ICD-10-CM

## 2022-01-04 NOTE — Progress Notes (Signed)
[facility] COMMUNITY PALLIATIVE CARE RN NOTE  PATIENT NAME: Jason [name] DOB: Jun 14, 1930 MRN: [id number]  PRIMARY CARE PROVIDER: Geoffry Paradise, MD  RESPONSIBLE PARTY: Kerri Perches (wife) Acct ID - Guarantor Home Phone Work Phone Relationship Acct Type  [id number] Rod Can* [id number]  Self P/F     [address], [place], [place] [address]   RN follow up telephonic encounter completed with patient's wife Myriam Jacobson. She reports that patient continues to mainly stay in bed most of the time. He will stay up a little at night to watch TV. This morning he woke up at about 11:30a. He mainly eats a brunch and dinner and doesn't snack much in between. She feels that he is still depressed. He has some intermittent confusion. He is withdrawn and does not have the desire to do anything. He is however now taking his Synthroid on a regular basis. He is ambulatory using a cane. No recent falls. He is independent, however she says he is not doing much personal care. She has someone that comes in 3 days/week to cook meals to help her as she has multiple health issues herself. She does not have any needs at this time. Palliative care will continue to follow.    Candiss Norse, RN BSN

## 2022-03-16 ENCOUNTER — Telehealth: Payer: Self-pay

## 2022-03-16 NOTE — Telephone Encounter (Signed)
1225- Palliative Care  Second attempt to return call regarding message received this am from spouse- Bonnita Nasuti.    No answer, left voice mail.   Will notify Christin, NP

## 2022-03-16 NOTE — Telephone Encounter (Signed)
1145- Palliative Care  Received message from spouse, Bonnita Nasuti with request to return call regarding possible ear infection. Attempted to contact. No answer, left voice mail.

## 2023-07-31 ENCOUNTER — Other Ambulatory Visit: Payer: Self-pay

## 2023-08-02 ENCOUNTER — Other Ambulatory Visit: Payer: Self-pay

## 2023-08-02 NOTE — Patient Outreach (Signed)
  Care Management   Visit Note  08/02/2023 Name: Jason [name] MRN: [id number] DOB: Dec 27, 1929  Subjective: Jason [name] is a 89 y.o. year old male.    Care Coordination: [facility]/In-Home Provider  Follow up regarding [name]'s need for an in-home provider.  Referral submitted to [facility]/Dr. Asenso for in-home primary care services.    PLAN Will follow up with [name] spouse/caregiver within the next week.    Jackson Acron [facility] Population Health RN Care Manager Direct Dial: [phone]  Fax: [phone] Website: [email or web address]

## 2023-08-04 ENCOUNTER — Other Ambulatory Visit: Payer: Self-pay

## 2023-08-04 NOTE — Patient Outreach (Signed)
  Care Management   Visit Note  08/04/2023 Name: Jason [name] MRN: [id number] DOB: 1930-02-26  Subjective: Jason [name] is a 89 y.o. year old male who is a primary care patient of Asenso, Philip, MD. The Care Management team was consulted for assistance.      Care Coordination: [facility] In-Home Provider  Message received from [facility]. Referral was received and approved for Jason [name] to receive in-home services. He is staffed to start outreach today. Jason [name] spouse/caregiver is aware.   PLAN No further follow up required.     Jackson Acron [facility] Population Health RN Care Manager Direct Dial: [phone]  Fax: [phone] Website: [email or web address]

## 2023-08-07 ENCOUNTER — Other Ambulatory Visit (HOSPITAL_BASED_OUTPATIENT_CLINIC_OR_DEPARTMENT_OTHER): Payer: Self-pay

## 2023-08-07 ENCOUNTER — Other Ambulatory Visit (HOSPITAL_COMMUNITY): Payer: Self-pay

## 2023-08-07 ENCOUNTER — Other Ambulatory Visit: Payer: Self-pay

## 2023-08-07 MED ORDER — SERTRALINE HCL 25 MG PO TABS
25.0000 mg | ORAL_TABLET | Freq: Every day | ORAL | 0 refills | Status: DC
Start: 2023-08-06 — End: 2023-12-29
  Filled 2023-08-07: qty 90, 90d supply, fill #0

## 2023-08-07 MED ORDER — SHINGRIX 50 MCG/0.5ML IM SUSR: INTRAMUSCULAR | 0 refills | Status: AC

## 2023-08-07 MED ORDER — MIDODRINE HCL 5 MG PO TABS
5.0000 mg | ORAL_TABLET | Freq: Three times a day (TID) | ORAL | 0 refills | Status: DC
  Filled 2023-08-07: qty 90, 30d supply, fill #0

## 2023-08-17 ENCOUNTER — Other Ambulatory Visit (HOSPITAL_COMMUNITY): Payer: Self-pay
